# Patient Record
Sex: Male | Born: 1965 | Race: White | Hispanic: No | Marital: Single | Smoking: Current some day smoker
Health system: Southern US, Community
[De-identification: ages and names within clinical notes are randomized; demographics above are authoritative.]

## PROBLEM LIST (undated history)

## (undated) DIAGNOSIS — M949 Disorder of cartilage, unspecified: Secondary | ICD-10-CM

## (undated) DIAGNOSIS — E109 Type 1 diabetes mellitus without complications: Secondary | ICD-10-CM

## (undated) DIAGNOSIS — M899 Disorder of bone, unspecified: Secondary | ICD-10-CM

## (undated) DIAGNOSIS — Z951 Presence of aortocoronary bypass graft: Secondary | ICD-10-CM

## (undated) DIAGNOSIS — E039 Hypothyroidism, unspecified: Secondary | ICD-10-CM

## (undated) DIAGNOSIS — M84369A Stress fracture, unspecified tibia and fibula, initial encounter for fracture: Secondary | ICD-10-CM

## (undated) DIAGNOSIS — D62 Acute posthemorrhagic anemia: Secondary | ICD-10-CM

## (undated) DIAGNOSIS — I509 Heart failure, unspecified: Secondary | ICD-10-CM

## (undated) DIAGNOSIS — F329 Major depressive disorder, single episode, unspecified: Secondary | ICD-10-CM

## (undated) DIAGNOSIS — I214 Non-ST elevation (NSTEMI) myocardial infarction: Secondary | ICD-10-CM

## (undated) DIAGNOSIS — F411 Generalized anxiety disorder: Secondary | ICD-10-CM

## (undated) DIAGNOSIS — R569 Unspecified convulsions: Secondary | ICD-10-CM

## (undated) DIAGNOSIS — G894 Chronic pain syndrome: Secondary | ICD-10-CM

## (undated) DIAGNOSIS — R55 Syncope and collapse: Secondary | ICD-10-CM

## (undated) DIAGNOSIS — F32A Depression, unspecified: Secondary | ICD-10-CM

## (undated) DIAGNOSIS — R1311 Dysphagia, oral phase: Secondary | ICD-10-CM

## (undated) DIAGNOSIS — I10 Essential (primary) hypertension: Secondary | ICD-10-CM

## (undated) DIAGNOSIS — F1120 Opioid dependence, uncomplicated: Secondary | ICD-10-CM

## (undated) DIAGNOSIS — J9600 Acute respiratory failure, unspecified whether with hypoxia or hypercapnia: Secondary | ICD-10-CM

## (undated) DIAGNOSIS — I219 Acute myocardial infarction, unspecified: Secondary | ICD-10-CM

## (undated) DIAGNOSIS — E785 Hyperlipidemia, unspecified: Secondary | ICD-10-CM

## (undated) DIAGNOSIS — I2510 Atherosclerotic heart disease of native coronary artery without angina pectoris: Secondary | ICD-10-CM

## (undated) DIAGNOSIS — E162 Hypoglycemia, unspecified: Secondary | ICD-10-CM

## (undated) DIAGNOSIS — I255 Ischemic cardiomyopathy: Secondary | ICD-10-CM

## (undated) DIAGNOSIS — K429 Umbilical hernia without obstruction or gangrene: Secondary | ICD-10-CM

## (undated) HISTORY — DX: Non-ST elevation (NSTEMI) myocardial infarction: I21.4

## (undated) HISTORY — DX: Syncope and collapse: R55

## (undated) HISTORY — DX: Hyperlipidemia, unspecified: E78.5

## (undated) HISTORY — PX: APPENDECTOMY: SHX54

## (undated) HISTORY — DX: Ischemic cardiomyopathy: I25.5

## (undated) HISTORY — DX: Generalized anxiety disorder: F41.1

## (undated) HISTORY — DX: Acute respiratory failure, unspecified whether with hypoxia or hypercapnia: J96.00

## (undated) HISTORY — DX: Chronic pain syndrome: G89.4

## (undated) HISTORY — DX: Opioid dependence, uncomplicated: F11.20

## (undated) HISTORY — DX: Hypothyroidism, unspecified: E03.9

## (undated) HISTORY — DX: Dysphagia, oral phase: R13.11

## (undated) HISTORY — DX: Acute posthemorrhagic anemia: D62

## (undated) HISTORY — PX: CARDIAC CATHETERIZATION: SHX172

## (undated) HISTORY — DX: Depression, unspecified: F32.A

## (undated) HISTORY — DX: Stress fracture, unspecified tibia and fibula, initial encounter for fracture: M84.369A

## (undated) HISTORY — DX: Disorder of bone, unspecified: M89.9

## (undated) HISTORY — DX: Major depressive disorder, single episode, unspecified: F32.9

## (undated) HISTORY — DX: Heart failure, unspecified: I50.9

## (undated) HISTORY — DX: Hypoglycemia, unspecified: E16.2

## (undated) HISTORY — DX: Type 1 diabetes mellitus without complications: E10.9

## (undated) HISTORY — DX: Umbilical hernia without obstruction or gangrene: K42.9

## (undated) HISTORY — DX: Atherosclerotic heart disease of native coronary artery without angina pectoris: I25.10

## (undated) HISTORY — DX: Disorder of cartilage, unspecified: M94.9

## (undated) HISTORY — DX: Acute myocardial infarction, unspecified: I21.9

---
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
## 1998-08-31 DIAGNOSIS — I10 Essential (primary) hypertension: Secondary | ICD-10-CM | POA: Insufficient documentation

## 1999-07-28 ENCOUNTER — Emergency Department (HOSPITAL_COMMUNITY): Admission: EM | Admit: 1999-07-28 | Discharge: 1999-07-28 | Payer: Self-pay | Admitting: Emergency Medicine

## 2000-03-24 ENCOUNTER — Emergency Department (HOSPITAL_COMMUNITY): Admission: EM | Admit: 2000-03-24 | Discharge: 2000-03-24 | Payer: Self-pay | Admitting: Emergency Medicine

## 2000-09-10 ENCOUNTER — Emergency Department (HOSPITAL_COMMUNITY): Admission: EM | Admit: 2000-09-10 | Discharge: 2000-09-10 | Payer: Self-pay | Admitting: Emergency Medicine

## 2000-09-10 ENCOUNTER — Encounter: Payer: Self-pay | Admitting: Emergency Medicine

## 2003-09-01 DIAGNOSIS — F329 Major depressive disorder, single episode, unspecified: Secondary | ICD-10-CM | POA: Insufficient documentation

## 2008-10-13 ENCOUNTER — Ambulatory Visit: Payer: Self-pay | Admitting: Diagnostic Radiology

## 2008-10-13 ENCOUNTER — Emergency Department (HOSPITAL_BASED_OUTPATIENT_CLINIC_OR_DEPARTMENT_OTHER): Admission: EM | Admit: 2008-10-13 | Discharge: 2008-10-13 | Payer: Self-pay | Admitting: Emergency Medicine

## 2009-09-08 ENCOUNTER — Ambulatory Visit: Payer: Self-pay | Admitting: Diagnostic Radiology

## 2009-09-08 ENCOUNTER — Emergency Department (HOSPITAL_BASED_OUTPATIENT_CLINIC_OR_DEPARTMENT_OTHER): Admission: EM | Admit: 2009-09-08 | Discharge: 2009-09-08 | Payer: Self-pay | Admitting: Emergency Medicine

## 2009-09-13 ENCOUNTER — Emergency Department (HOSPITAL_COMMUNITY): Admission: EM | Admit: 2009-09-13 | Discharge: 2009-09-13 | Payer: Self-pay | Admitting: Emergency Medicine

## 2010-05-14 ENCOUNTER — Ambulatory Visit: Payer: Self-pay | Admitting: Nurse Practitioner

## 2010-05-14 DIAGNOSIS — E7800 Pure hypercholesterolemia, unspecified: Secondary | ICD-10-CM | POA: Insufficient documentation

## 2010-05-14 DIAGNOSIS — E039 Hypothyroidism, unspecified: Secondary | ICD-10-CM

## 2010-05-14 DIAGNOSIS — E109 Type 1 diabetes mellitus without complications: Secondary | ICD-10-CM

## 2010-05-14 LAB — CONVERTED CEMR LAB
Bilirubin Urine: NEGATIVE
Blood Glucose, Fingerstick: 113
Blood in Urine, dipstick: NEGATIVE
Cholesterol, target level: 200 mg/dL
Glucose, Urine, Semiquant: NEGATIVE
HDL goal, serum: 40 mg/dL
Hgb A1c MFr Bld: 6.2 %
Ketones, urine, test strip: NEGATIVE
LDL Goal: 100 mg/dL
Nitrite: NEGATIVE
Protein, U semiquant: NEGATIVE
Specific Gravity, Urine: 1.015
Urobilinogen, UA: 0.2
WBC Urine, dipstick: NEGATIVE
pH: 7

## 2010-05-15 LAB — CONVERTED CEMR LAB
ALT: 13 units/L (ref 0–53)
AST: 13 units/L (ref 0–37)
Barbiturate Quant, Ur: NEGATIVE
Basophils Absolute: 0 10*3/uL (ref 0.0–0.1)
Basophils Relative: 0 % (ref 0–1)
CO2: 26 meq/L (ref 19–32)
Calcium: 9.4 mg/dL (ref 8.4–10.5)
Chloride: 106 meq/L (ref 96–112)
Creatinine,U: 132.4 mg/dL
Hemoglobin: 14.5 g/dL (ref 13.0–17.0)
Lymphocytes Relative: 28 % (ref 12–46)
MCHC: 33.7 g/dL (ref 30.0–36.0)
Marijuana Metabolite: NEGATIVE
Neutro Abs: 4.4 10*3/uL (ref 1.7–7.7)
Neutrophils Relative %: 60 % (ref 43–77)
Opiate Screen, Urine: NEGATIVE
Platelets: 348 10*3/uL (ref 150–400)
Potassium: 4.4 meq/L (ref 3.5–5.3)
Propoxyphene: NEGATIVE
RDW: 14.4 % (ref 11.5–15.5)
Sodium: 142 meq/L (ref 135–145)
TSH: 0.326 microintl units/mL — ABNORMAL LOW (ref 0.350–4.500)
Total Protein: 7.1 g/dL (ref 6.0–8.3)

## 2010-05-16 ENCOUNTER — Encounter (INDEPENDENT_AMBULATORY_CARE_PROVIDER_SITE_OTHER): Payer: Self-pay | Admitting: Nurse Practitioner

## 2010-05-27 ENCOUNTER — Encounter (INDEPENDENT_AMBULATORY_CARE_PROVIDER_SITE_OTHER): Payer: Self-pay | Admitting: Nurse Practitioner

## 2010-06-02 ENCOUNTER — Telehealth (INDEPENDENT_AMBULATORY_CARE_PROVIDER_SITE_OTHER): Payer: Self-pay | Admitting: *Deleted

## 2010-06-04 ENCOUNTER — Encounter (INDEPENDENT_AMBULATORY_CARE_PROVIDER_SITE_OTHER): Payer: Self-pay | Admitting: Nurse Practitioner

## 2010-06-11 ENCOUNTER — Ambulatory Visit: Payer: Self-pay | Admitting: Nurse Practitioner

## 2010-06-11 DIAGNOSIS — N529 Male erectile dysfunction, unspecified: Secondary | ICD-10-CM

## 2010-06-12 ENCOUNTER — Encounter (INDEPENDENT_AMBULATORY_CARE_PROVIDER_SITE_OTHER): Payer: Self-pay | Admitting: Nurse Practitioner

## 2010-08-06 ENCOUNTER — Encounter (INDEPENDENT_AMBULATORY_CARE_PROVIDER_SITE_OTHER): Payer: Self-pay | Admitting: Nurse Practitioner

## 2010-08-31 HISTORY — PX: FRACTURE SURGERY: SHX138

## 2010-09-23 ENCOUNTER — Ambulatory Visit
Admission: RE | Admit: 2010-09-23 | Discharge: 2010-09-23 | Payer: Self-pay | Source: Home / Self Care | Attending: Nurse Practitioner | Admitting: Nurse Practitioner

## 2010-09-23 DIAGNOSIS — F17200 Nicotine dependence, unspecified, uncomplicated: Secondary | ICD-10-CM | POA: Insufficient documentation

## 2010-09-23 LAB — CONVERTED CEMR LAB
Nitrite: NEGATIVE
Urobilinogen, UA: 0.2

## 2010-09-28 LAB — CONVERTED CEMR LAB
Bilirubin Urine: NEGATIVE
Chlamydia, Swab/Urine, PCR: NEGATIVE
GC Probe Amp, Urine: NEGATIVE
HDL: 60 mg/dL (ref >39)
Ketones, urine, test strip: NEGATIVE
Nitrite: NEGATIVE
Protein, U semiquant: NEGATIVE
Total CHOL/HDL Ratio: 3.3
Triglycerides: 72 mg/dL (ref <150)
Urobilinogen, UA: 0.2
pH: 6.5

## 2010-09-29 ENCOUNTER — Encounter (INDEPENDENT_AMBULATORY_CARE_PROVIDER_SITE_OTHER): Payer: Self-pay | Admitting: Nurse Practitioner

## 2010-09-30 NOTE — Letter (Signed)
Summary: PT INFORMATION SHEET  PT INFORMATION SHEET   Imported By: Arta Bruce 05/15/2010 10:27:48  _____________________________________________________________________  External Attachment:    Type:   Image     Comment:   External Document

## 2010-09-30 NOTE — Letter (Signed)
Summary: Lipid Letter  [facility]  [address] [place], [place] [address]   Phone: [phone]  Fax: [phone]    06/12/2010  Reginald Gutierrez [address] [place], [place]  [address]  Dear Reginald Gutierrez:  We have carefully reviewed your last lipid profile from 06/11/2010 and the results are noted below with a summary of recommendations for lipid management.    Cholesterol:       200     Goal: less than 200   HDL "good" Cholesterol:   60     Goal: greater than 40   LDL "bad" Cholesterol:   126     Goal: less than 70   Triglycerides:       72     Goal: less than 150    Labs done during your recent office visit shows that your cholesterol is still not at goal. Please continue your medications.  Avoid fried fatty foods.    Current Medications: 1)    Lisinopril 40 Mg Tabs (Lisinopril) .... One tablet by mouth daily for blood pressure 2)    Levothroid 150 Mcg Tabs (Levothyroxine sodium) .... One tablet by mouth daily  **note change in dose** 3)    Lipitor 40 Mg Tabs (Atorvastatin calcium) .... One tablet by mouth nightly for cholesterol 4)    Humalog 100 Unit/ml Soln (Insulin lispro (human)) .... Use according to the sliding scale 5)    Lantus 100 Unit/ml Soln (Insulin glargine) .Marland Kitchen.. 15 units subcutaneously nightly 6)    Sertraline Hcl 100 Mg Tabs (Sertraline hcl) .... One tablet by mouth daily for mood *note change in dose** 7)    Blood Glucose Test  Strp (Glucose blood) .... Use to test blood sugar at least 4 times per day **brittle diabetic** 8)    Blood Glucose Meter  Kit (Blood glucose monitoring suppl) .... Use to check blood sugar at least 4 times per day 9)    Lancets  Misc (Lancets) .... Use to check blood sugar four times per day **brittle diabetic**  If you have any questions, please call. We appreciate being able to work with you.   Sincerely,    [facility] Lehman Prom FNP

## 2010-09-30 NOTE — Progress Notes (Signed)
Summary: NEEDS LETTER TO RETURN BACK TO WORK  Phone Note Call from Patient Call back at [phone]   Caller: Patient Reason for Call: Talk to Nurse Summary of Call: pt needs a letter stating that he si stable enough to go back to work and that he hasnt had any more low sugar episodes please fax letter to [phone] attn: to steve and romana Adante HAD NO BLOOD SUGAR EPISODES,Tylique DIABETES IS UNDER CONTROL AND HE IS ABLE TO WORK. Initial call taken by: Oscar La,  June 02, 2010 2:57 PM  Follow-up for Phone Call        MR Klippel CALLED AGAIN TO SEE IF HE CAN GET THE LETTER TO GO BACK TO WORK, BECAUSE HIS BOSS HAS CALLED HIM TWICE ASKING IF HIS DAIBETES IS UNDER CONTROL, BECAUE HE NEEDS TO WORK TO PAY HIS RENT OR HE WILL BE HOMELESS. HE NEEDS THE LETTER AT LEAST BY TOMORROW. Follow-up by: Leodis Rains,  June 03, 2010 5:00 PM  Additional Follow-up for Phone Call Additional follow up Details #1::        last hgba1c = 6.2 so his diabetes is under good control as far as hypoglycemic episodes - i don't know that as i am not with him and he has not presented me with a blood sugar log will write a letter indicating that sugar is controlled and he is ok to go back to work based on that. he can pick up letter today - in my office Additional Follow-up by: Lehman Prom FNP,  June 04, 2010 8:36 AM    Additional Follow-up for Phone Call Additional follow up Details #2::    FAXED LETTER  Follow-up by: Arta Bruce,  June 04, 2010 9:21 AM

## 2010-09-30 NOTE — Letter (Signed)
Summary: DEPARTMENT OF TRANSPORTATION/MAILED  DEPARTMENT OF TRANSPORTATION/MAILED   Imported By: Arta Bruce 05/16/2010 09:49:11  _____________________________________________________________________  External Attachment:    Type:   Image     Comment:   External Document

## 2010-09-30 NOTE — Letter (Signed)
Summary: Handout Printed  Printed Handout:  - Diet - Low-Cholesterol Guidelines 

## 2010-09-30 NOTE — Letter (Signed)
Summary: Handout Printed  Printed Handout:  - Depression-Brief 

## 2010-09-30 NOTE — Letter (Signed)
Summary: REQUESTING RECORDS FROM DR.[facility]  REQUESTING RECORDS FROM DR.KUMAR   Imported By: Arta Bruce 05/28/2010 14:31:00  _____________________________________________________________________  External Attachment:    Type:   Image     Comment:   External Document

## 2010-09-30 NOTE — Miscellaneous (Signed)
Summary: Records from Reather Littler  Clinical Lists Changes Historical file with records received

## 2010-09-30 NOTE — Letter (Signed)
Summary: Generic Letter  [facility]  [address] [place], [place] [address]   Phone: [phone]  Fax: [phone]    06/04/2010  Reginald Gutierrez [address] [place], [place]  [address]  To whom it may concern:  [name] established as a patient in this office on May 14, 2010.  His past medical history includes that of diabetes, which was diagnosed at age 45.  He is however stable at this time on his current dose of insulin.  Lab results done on that day show that his diabetes is now controlled.  Of course, he should continue to take his medications and monitor his diet which is also key to good diabetes control.  [name] may return to work at full capacity as of my last assessment.    Sincerely,    Lehman Prom FNP [facility]

## 2010-09-30 NOTE — Progress Notes (Signed)
Summary: Office Visit//DEPRESSION SCREENING  Office Visit//DEPRESSION SCREENING   Imported By: Arta Bruce 06/11/2010 12:29:23  _____________________________________________________________________  External Attachment:    Type:   Image     Comment:   External Document

## 2010-09-30 NOTE — Progress Notes (Signed)
Summary: Office Visit//DEPRESSION SCREENING  Office Visit//DEPRESSION SCREENING   Imported By: Arta Bruce 05/15/2010 10:41:40  _____________________________________________________________________  External Attachment:    Type:   Image     Comment:   External Document

## 2010-09-30 NOTE — Assessment & Plan Note (Signed)
Summary: New - Establish Care   Vital Signs:  Patient profile:   45 year old male Height:      64.5 inches Weight:      184.5 pounds BMI:     31.29 Temp:     97.6 degrees F oral Pulse rate:   73 / minute Pulse rhythm:   regular Resp:     16 per minute BP sitting:   142 / 76  (left arm) Cuff size:   regular  Vitals Entered By: Michelle Nasuti (May 14, 2010 8:40 AM) CC: establish care. dm and depression follow up, Lipid Management, Hypertension Management, Depression Is Patient Diabetic? Yes Pain Assessment Patient in pain? no      CBG Result 113 CBG Device ID [id number]  Does patient need assistance? Functional Status Self care Ambulation Normal  Vision Screening:      Vision Comments: 09/2007   CC:  establish care. dm and depression follow up, Lipid Management, Hypertension Management, and Depression.  History of Present Illness:  Pt into the office to establish care. Pt was previously seen by Dr. Lucianne Muss at a local office.  Last visit there 6 months.   Finances kept pt from returning to that office.  Diabetes - pt is checking his blood sugar three times per day  Depression History:      The patient presents with symptoms of depression which have been present for greater than two weeks.  The patient is having a depressed mood most of the day and has a diminished interest in his usual daily activities.  Positive alarm features for depression include significant weight gain and fatigue (loss of energy).  However, he denies recurrent thoughts of death or suicide.        Psychosocial stress factors include major life changes.  The patient denies that he feels like life is not worth living, denies that he wishes that he were dead, and denies that he has thought about ending his life.  His current symptoms often keep him from doing the things he needs to do.        Comments:  Pt was previously taking medications in the past but he is unable to recall the name of the  medications.  Pt has been going to [employer] for Counseling and is going once weekly. .  Depression Treatment History:  Prior Medication Used:   Start Date: Assessment of Effect:   Comments:  Zoloft (sertraline)     05/14/2010   some improvement     --  Diabetes Management History:      The patient is a 45 years old male who comes in for evaluation of DM Type 1.  He has not been enrolled in the "Diabetic Education Program".  He states understanding of dietary principles but he is not following the appropriate diet.  No sensory loss is reported.  Self foot exams are not being performed.  He is checking home blood sugars.        Reported hypoglycemic symptoms include weakness.  No hyperglycemic symptoms are reported.    Hypertension History:      He denies headache, chest pain, and palpitations.  He notes no problems with any antihypertensive medication side effects.        Positive major cardiovascular risk factors include diabetes, hyperlipidemia, hypertension, and current tobacco user.  Negative major cardiovascular risk factors include male age less than 69 years old.    Lipid Management History:      Positive NCEP/ATP  III risk factors include diabetes, current tobacco user, and hypertension.  Negative NCEP/ATP III risk factors include male age less than 45 years old.        Comments include: Pt has not been taking lipitor in the past 2 months due to finances .  Comments: he is not fasting today so is unable to get labs.  Diabetic Foot Exam Pulse Check          Right Foot          Left Foot Dorsalis Pedis:        normal            normal    10-g (5.07) Semmes-Weinstein Monofilament Test Performed by: Michelle Nasuti          Right Foot          Left Foot Visual Inspection               Test Control      normal         normal Site 1         normal         normal Site 2         normal         normal Site 3         normal         normal Site 4         normal          normal Site 5         normal         normal Site 6         normal         normal Site 7         normal         normal Site 8         normal         normal Site 9         normal         normal Site 10         normal         normal  Impression      normal         normal  Legend:  Site 1 = Plantar aspect of first toe (center of pad) Site 2 = Plantar aspect of third toe (center of pad) Site 3 = Plantar aspect of fifth toe (center of pad) Site 4 = Plantar aspect of first metatarsal head Site 5 = Plantar aspect of third metatarsal head Site 6 = Plantar aspect of fifth metatarsal head Site 7 = Plantar aspect of medial midfoot Site 8 = Plantar aspect of lateral midfoot Site 9 = Plantar aspect of heel Site 10 = dorsal aspect of foot between the base of the first and second toes   Result is Abnormal if patient was unable to perceive the monofilament at site indicated.       Habits & Providers  Alcohol-Tobacco-Diet     Alcohol drinks/day: 0     Tobacco Status: current     Tobacco Counseling: to quit use of tobacco products     Cigarette Packs/Day: 0.5     Year Started: age 61  Exercise-Depression-Behavior     Have you felt down or hopeless? yes     Have you felt little pleasure in things? yes     Depression Counseling: further diagnostic testing  and/or other treatment is indicated     Drug Use: past     Drug Use Counseling: quit - 2005  Comments: PHQ-9 score = 23  Current Medications (verified): 1)  Lisinopril 10 Mg Tabs (Lisinopril) .... One Daily 2)  Levothroid 175 Mcg Tabs (Levothyroxine Sodium) .... One Daily 3)  Lipitor 40 Mg Tabs (Atorvastatin Calcium) .... One Q Hs 4)  Humalog Kwikpen 100 Unit/ml Soln (Insulin Lispro (Human)) .... Sliding Scale 5)  Lantus 100 Unit/ml Soln (Insulin Glargine) .Marland KitchenMarland Kitchen. 17 Units Q Hs  Allergies (verified): No Known Drug Allergies  Past History:  Past Surgical History: Appendectomy 1978  Social History: Smoking Status:   current Packs/Day:  0.5 Drug Use:  past  Review of Systems General:  Denies fever. CV:  Denies chest pain or discomfort. Resp:  Denies cough. GI:  Denies abdominal pain. Neuro:  Denies headaches. Psych:  Complains of depression; denies mental problems.  Physical Exam  General:  alert.   Head:  normocephalic.   Eyes:  glasses Lungs:  normal breath sounds.   Heart:  normal rate and regular rhythm.   Abdomen:  normal bowel sounds.   Msk:  normal ROM.   Neurologic:  alert & oriented X3.   Skin:  color normal.   Psych:  Oriented X3.    Diabetes Management Exam:    Foot Exam (with socks and/or shoes not present):       Sensory-Monofilament:          Left foot: normal          Right foot: normal       Inspection:          Left foot: abnormal             Comments: callous on heels          Right foot: abnormal             Comments: callous on heels       Nails:          Left foot: normal          Right foot: normal    Eye Exam:       Eye Exam done elsewhere          Date: 08/31/2006          Results: normal          Done by: Optho   Impression & Recommendations:  Problem # 1:  DIABETES MELLITUS, TYPE I (ICD-250.01) Hgba1c controlled. continue current dose of insulin will need to review records to see if pt every had pneumovax advised pt that eye exam is due His updated medication list for this problem includes:    Lisinopril 20 Mg Tabs (Lisinopril) ..... One tablet by mouth daily for blood pressure    Humalog 100 Unit/ml Soln (Insulin lispro (human)) ..... Use according to the sliding scale    Lantus 100 Unit/ml Soln (Insulin glargine) .Marland KitchenMarland KitchenMarland KitchenMarland Kitchen 17 units subcutaneously nightly  Orders: Hemoglobin A1C (83036) UA Dipstick w/o Micro (manual) (04540) T-Urine Microalbumin w/creat. ratio [phone]) T-Comprehensive Metabolic Panel [phone]) T-CBC w/Diff (96295-28413)  Problem # 2:  HYPERTENSION, BENIGN ESSENTIAL (ICD-401.1) elevated today will increase  meds DASH diet His updated medication list for this problem includes:    Lisinopril 20 Mg Tabs (Lisinopril) ..... One tablet by mouth daily for blood pressure  Problem # 3:  HYPOTHYROIDISM (ICD-244.9) Will check labs today His updated medication list for this problem includes:    Levothroid 175 Mcg  Tabs (Levothyroxine sodium) ..... One tablet by mouth daily  Orders: T-TSH ([id number])  Problem # 4:  HYPERCHOLESTEROLEMIA (ICD-272.0) pt is not fasting. will check labs on next visit His updated medication list for this problem includes:    Lipitor 40 Mg Tabs (Atorvastatin calcium) ..... One tablet by mouth nightly for cholesterol  Problem # 5:  DEPRESSION (ICD-311) pt to continue counseling will start on SSRI His updated medication list for this problem includes:    Sertraline Hcl 50 Mg Tabs (Sertraline hcl) ..... One tablet by mouth nigtly for mood  Complete Medication List: 1)  Lisinopril 20 Mg Tabs (Lisinopril) .... One tablet by mouth daily for blood pressure 2)  Levothroid 175 Mcg Tabs (Levothyroxine sodium) .... One tablet by mouth daily 3)  Lipitor 40 Mg Tabs (Atorvastatin calcium) .... One tablet by mouth nightly for cholesterol 4)  Humalog 100 Unit/ml Soln (Insulin lispro (human)) .... Use according to the sliding scale 5)  Lantus 100 Unit/ml Soln (Insulin glargine) .Marland KitchenMarland KitchenMarland Kitchen 17 units subcutaneously nightly 6)  Sertraline Hcl 50 Mg Tabs (Sertraline hcl) .... One tablet by mouth nigtly for mood  Other Orders: Capillary Blood Glucose/CBG (01601)  Diabetes Management Assessment/Plan:      The following lipid goals have been established for the patient: Total cholesterol goal of 200; LDL cholesterol goal of 100; HDL cholesterol goal of 40; Triglyceride goal of 150.  His blood pressure goal is < 130/80.    Hypertension Assessment/Plan:      The patient's hypertensive risk group is category C: Target organ damage and/or diabetes.  Today's blood pressure is 142/76.  His blood  pressure goal is < 130/80.  Lipid Assessment/Plan:      Based on NCEP/ATP III, the patient's risk factor category is "history of diabetes".  The patient's lipid goals are as follows: Total cholesterol goal is 200; LDL cholesterol goal is 100; HDL cholesterol goal is 40; Triglyceride goal is 150.    Patient Instructions: 1)  Sign a release of records to get your records from Dr. Lucianne Muss 2)  Diabetes - your Hgba1c = 6.2 today which is very good.  Keep on your current dose of insulin 3)  High blood pressure - lisinopril has been increased to 20mg  by mouth daily 4)  Depression - Continue to go to therapy sessions weekly 5)  will start sertrazine 50mg  by mouth NIGHTLY.  This will take 4-6 weeks to build up in your system but you should start to see some improvement.  Remember the goal is for gradual improvement. 6)  you will be informed of lab results from today 7)  Follow up for a complete physical exam in 4 weeks. 8)  Come fasting for labs - lipids. 9)  Will need EKG, u/a, foot check, cbg, PHQ-9 score. 10)  Will discuss flu vaccine if available Prescriptions: SERTRALINE HCL 50 MG TABS (SERTRALINE HCL) One tablet by mouth nigtly for mood  #30 x 3   Entered and Authorized by:   Lehman Prom FNP   Signed by:   Lehman Prom FNP on 05/14/2010   Method used:   Print then Give to Patient   RxID:   [id number] LISINOPRIL 20 MG TABS (LISINOPRIL) One tablet by mouth daily for blood pressure  #30 x 3   Entered and Authorized by:   Lehman Prom FNP   Signed by:   Lehman Prom FNP on 05/14/2010   Method used:   Print then Give to Patient   RxID:   [id number] LIPITOR 40  MG TABS (ATORVASTATIN CALCIUM) One tablet by mouth nightly for cholesterol  #30 x 3   Entered and Authorized by:   Lehman Prom FNP   Signed by:   Lehman Prom FNP on 05/14/2010   Method used:   Print then Give to Patient   RxID:   [id number] HUMALOG 100 UNIT/ML SOLN (INSULIN LISPRO (HUMAN)) Use  according to the sliding scale  #1 x 3   Entered and Authorized by:   Lehman Prom FNP   Signed by:   Lehman Prom FNP on 05/14/2010   Method used:   Print then Give to Patient   RxID:   [id number] LANTUS 100 UNIT/ML SOLN (INSULIN GLARGINE) 17 units subcutaneously nightly  #1 x 3   Entered and Authorized by:   Lehman Prom FNP   Signed by:   Lehman Prom FNP on 05/14/2010   Method used:   Print then Give to Patient   RxID:   [id number] LEVOTHROID 175 MCG TABS (LEVOTHYROXINE SODIUM) One tablet by mouth daily  #30 x 3   Entered and Authorized by:   Lehman Prom FNP   Signed by:   Lehman Prom FNP on 05/14/2010   Method used:   Print then Give to Patient   RxID:   [id number]           Diabetic Foot Exam Pulse Check          Right Foot          Left Foot Dorsalis Pedis:        normal            normal    10-g (5.07) Semmes-Weinstein Monofilament Test Performed by: Michelle Nasuti          Right Foot          Left Foot Visual Inspection               Test Control      normal         normal Site 1         normal         normal Site 2         normal         normal Site 3         normal         normal Site 4         normal         normal Site 5         normal         normal Site 6         normal         normal Site 7         normal         abnormal Site 8         abnormal         abnormal Site 9         abnormal         normal Site 10         normal         normal   Laboratory Results   Urine Tests  Date/Time Received: May 14, 2010 8:48 AM   Routine Urinalysis   Color: lt. yellow Appearance: Clear Glucose: negative   (Normal Range: Negative) Bilirubin: negative   (Normal Range: Negative) Ketone: negative   (Normal Range: Negative) Spec. Gravity: 1.015   (Normal Range: 1.003-1.035)  Blood: negative   (Normal Range: Negative) pH: 7.0   (Normal Range: 5.0-8.0) Protein: negative   (Normal Range: Negative) Urobilinogen: 0.2    (Normal Range: 0-1) Nitrite: negative   (Normal Range: Negative) Leukocyte Esterace: negative   (Normal Range: Negative)     Blood Tests     HGBA1C: 6.2%   (Normal Range: Non-Diabetic - 3-6%   Control Diabetic - 6-8%) CBG Random:: 113      Appended Document: New - Establish Care    Clinical Lists Changes  Orders: Added new Test order of T-Drug [facility]) [phone]) - Signed

## 2010-09-30 NOTE — Letter (Signed)
Summary: RECEIVED INFORMATION FROM Reather Littler.M.D  RECEIVED INFORMATION FROM Reather Littler.M.D   Imported By: Arta Bruce 08/08/2010 11:33:16  _____________________________________________________________________  External Attachment:    Type:   Image     Comment:   External Document

## 2010-09-30 NOTE — Assessment & Plan Note (Signed)
Summary: Complete Physical Exam   Vital Signs:  Patient profile:   45 year old male Weight:      182.2 pounds BMI:     30.90 Temp:     97.5 degrees F oral Pulse rate:   88 / minute Pulse rhythm:   regular Resp:     20 per minute BP sitting:   130 / 70  (left arm) Cuff size:   regular  Vitals Entered By: Levon Hedger (June 11, 2010 9:53 AM)  Nutrition Counseling: Patient's BMI is greater than 25 and therefore counseled on weight management options. CC: CPE, Hypertension Management, Depression Is Patient Diabetic? Yes CBG Result 39 CBG Device ID B  Does patient need assistance? Functional Status Self care Ambulation Normal   CC:  CPE, Hypertension Management, and Depression.  History of Present Illness:  Pt into the office for a complete physical exam.  Optho - wears glasses. last eye exam was over 2 years ago.    Dental - no recent dental exam. Pt is aware that he needs to get an dental exam  Social - Pt has started back to work.  Pt did not bring his medications into the office today.  Advised pt to bring meds into the office  Depression History:      The patient is having a depressed mood most of the day and has a diminished interest in his usual daily activities.        The patient denies that he feels like life is not worth living, denies that he wishes that he were dead, and denies that he has thought about ending his life.  Due to his current symptoms, it often takes extra effort to do the things he needs to do.         Depression Treatment History:  Prior Medication Used:   Start Date: Assessment of Effect:   Comments:  Zoloft (sertraline)     05/14/2010   some improvement     --  Diabetes Management History:      The patient is a 45 years old male who comes in for evaluation of DM Type 1.  He has not been enrolled in the "Diabetic Education Program".  He states lack of understanding of dietary principles and is not following his diet appropriately.   No sensory loss is reported.  Self foot exams are not being performed.  He is checking home blood sugars.        Reported hypoglycemic symptoms include confusion and weakness.  No hyperglycemic symptoms are reported.  Other comments include: Pt has frequent low hypoglycemic episodes.    Hypertension History:      He denies headache, chest pain, and palpitations.  He notes no problems with any antihypertensive medication side effects.        Positive major cardiovascular risk factors include diabetes, hyperlipidemia, hypertension, and current tobacco user.  Negative major cardiovascular risk factors include male age less than 22 years old.        Further assessment for target organ damage reveals no history of ASHD, cardiac end-organ damage (CHF/LVH), stroke/TIA, peripheral vascular disease, renal insufficiency, or hypertensive retinopathy.      Habits & Providers  Alcohol-Tobacco-Diet     Alcohol drinks/day: 0     Tobacco Status: current     Tobacco Counseling: to quit use of tobacco products     Cigarette Packs/Day: 0.5     Year Started: age 57  Exercise-Depression-Behavior     Exercise Counseling: to  improve exercise regimen     Have you felt down or hopeless? yes     Have you felt little pleasure in things? yes     Depression Counseling: further diagnostic testing and/or other treatment is indicated     Drug Use: past  Comments: Pt has started to see the psychologist PHQ-9 score = 22  Current Medications (verified): 1)  Lisinopril 20 Mg Tabs (Lisinopril) .... One Tablet By Mouth Daily For Blood Pressure 2)  Levothroid 150 Mcg Tabs (Levothyroxine Sodium) .... One Tablet By Mouth Daily  **note Change in Dose** 3)  Lipitor 40 Mg Tabs (Atorvastatin Calcium) .... One Tablet By Mouth Nightly For Cholesterol 4)  Humalog 100 Unit/ml Soln (Insulin Lispro (Human)) .... Use According To The Sliding Scale 5)  Lantus 100 Unit/ml Soln (Insulin Glargine) .Marland Kitchen.. 15 Units Subcutaneously  Nightly 6)  Sertraline Hcl 50 Mg Tabs (Sertraline Hcl) .... One Tablet By Mouth Nigtly For Mood 7)  Blood Glucose Test  Strp (Glucose Blood) .... Use To Test Blood Sugar At Least 4 Times Per Day **brittle Diabetic** 8)  Blood Glucose Meter  Kit (Blood Glucose Monitoring Suppl) .... Use To Check Blood Sugar At Least 4 Times Per Day 9)  Lancets  Misc (Lancets) .... Use To Check Blood Sugar Four Times Per Day **brittle Diabetic**  Allergies (verified): No Known Drug Allergies  Review of Systems General:  Denies fever. Eyes:  Denies blurring. ENT:  Denies earache. CV:  Denies chest pain or discomfort. Resp:  Denies cough. GI:  Denies abdominal pain, nausea, and vomiting. GU:  Denies dysuria. MS:  Denies joint pain. Derm:  Denies rash. Neuro:  Denies headaches. Psych:  Complains of depression.  Physical Exam  General:  alert.   Head:  normocephalic.   Eyes:  glasses Ears:  R ear normal and L ear normal.   Nose:  no nasal discharge.   Mouth:  fair dentition.   Neck:  supple.   Chest Wall:  no masses.   Breasts:  no gynecomastia.   Lungs:  normal breath sounds.   Heart:  normal rate and regular rhythm.   Abdomen:  soft, non-tender, and normal bowel sounds.   Rectal:  no external abnormalities.   Genitalia:  circumcised.  no testicular masses Prostate:  no gland enlargement.   Msk:  normal ROM.   Pulses:  R radial normal and L radial normal.   Extremities:  no edema Neurologic:  alert & oriented X3.   Skin:  color normal.   Psych:  Oriented X3.    Diabetes Management Exam:    Foot Exam (with socks and/or shoes not present):       Sensory-Monofilament:          Left foot: normal          Right foot: normal       Nails:          Left foot: normal          Right foot: normal   Impression & Recommendations:  Problem # 1:  HEALTH MAINTENANCE EXAM (ICD-V70.0) PHQ-9 score = 22 Advised of rec optho and dental exam labs cone prostate and rectal done Orders: T-Syphilis  Test (RPR) (14782-95621) Rapid HIV  (30865) T-GC Probe, urine (78469-62952) UA Dipstick w/o Micro (manual) (84132)  Problem # 2:  DIABETES MELLITUS, TYPE I (ICD-250.01) still with frqunt lows advised pt to check BS four times per day eat protein snacks between meals His updated medication list for this problem  includes:    Lisinopril 40 Mg Tabs (Lisinopril) ..... One tablet by mouth daily for blood pressure    Humalog 100 Unit/ml Soln (Insulin lispro (human)) ..... Use according to the sliding scale    Lantus 100 Unit/ml Soln (Insulin glargine) .Marland KitchenMarland KitchenMarland KitchenMarland Kitchen 15 units subcutaneously nightly  Problem # 3:  HYPERTENSION, BENIGN ESSENTIAL (ICD-401.1)  His updated medication list for this problem includes:    Lisinopril 40 Mg Tabs (Lisinopril) ..... One tablet by mouth daily for blood pressure  Orders: EKG w/ Interpretation (93000)  Problem # 4:  HYPOTHYROIDISM (ICD-244.9)  His updated medication list for this problem includes:    Levothroid 150 Mcg Tabs (Levothyroxine sodium) ..... One tablet by mouth daily  **note change in dose**  Problem # 5:  HYPERCHOLESTEROLEMIA (ICD-272.0)  His updated medication list for this problem includes:    Lipitor 40 Mg Tabs (Atorvastatin calcium) ..... One tablet by mouth nightly for cholesterol  Orders: T-Lipid Profile (86578-46962)  Problem # 6:  DEPRESSION (ICD-311)  His updated medication list for this problem includes:    Sertraline Hcl 100 Mg Tabs (Sertraline hcl) ..... One tablet by mouth daily for mood *note change in dose**  Complete Medication List: 1)  Lisinopril 40 Mg Tabs (Lisinopril) .... One tablet by mouth daily for blood pressure 2)  Levothroid 150 Mcg Tabs (Levothyroxine sodium) .... One tablet by mouth daily  **note change in dose** 3)  Lipitor 40 Mg Tabs (Atorvastatin calcium) .... One tablet by mouth nightly for cholesterol 4)  Humalog 100 Unit/ml Soln (Insulin lispro (human)) .... Use according to the sliding scale 5)  Lantus 100  Unit/ml Soln (Insulin glargine) .Marland Kitchen.. 15 units subcutaneously nightly 6)  Sertraline Hcl 100 Mg Tabs (Sertraline hcl) .... One tablet by mouth daily for mood *note change in dose** 7)  Blood Glucose Test Strp (Glucose blood) .... Use to test blood sugar at least 4 times per day **brittle diabetic** 8)  Blood Glucose Meter Kit (Blood glucose monitoring suppl) .... Use to check blood sugar at least 4 times per day 9)  Lancets Misc (Lancets) .... Use to check blood sugar four times per day **brittle diabetic**  Other Orders: Capillary Blood Glucose/CBG (95284) Flu Vaccine 41yrs + (13244) Admin 1st Vaccine (01027) Admin 1st Vaccine [facility]) [phone])  Diabetes Management Assessment/Plan:      The following lipid goals have been established for the patient: Total cholesterol goal of 200; LDL cholesterol goal of 100; HDL cholesterol goal of 40; Triglyceride goal of 150.  His blood pressure goal is < 130/80.    Hypertension Assessment/Plan:      The patient's hypertensive risk group is category C: Target organ damage and/or diabetes.  Today's blood pressure is 130/70.  His blood pressure goal is < 130/80.  Patient Instructions: 1)  Niurse visit in 3 weeks for TSH 2)  Remember about the free dental clinic in November. 3)  Mood - increase medication to sertralizine 100mg  by mouth daily. 4)  Flu vaccine was given today 5)  Diabetes - you should be checking your blood sugar four times per day.  A prescription for meter and supplies has been sent to [facility] pharmacy.  Remember to eat snacks between your meals. 6)  Follow up with n.martin,fnp in 2 months for diabetes. 7)  Will need tdap, hgba1c, foot check, cbg Prescriptions: SERTRALINE HCL 100 MG TABS (SERTRALINE HCL) One tablet by mouth daily for mood *Note change in dose**  #30 x 3   Entered and Authorized by:  Lehman Prom FNP   Signed by:   Lehman Prom FNP on 06/11/2010   Method used:   Print then Give to Patient   RxID:    [id number] LISINOPRIL 40 MG TABS (LISINOPRIL) One tablet by mouth daily for blood pressure  #30 x 0   Entered and Authorized by:   Lehman Prom FNP   Signed by:   Lehman Prom FNP on 06/11/2010   Method used:   Print then Give to Patient   RxID:   [id number] LANCETS  MISC (LANCETS) Use to check blood sugar four times per day **brittle diabetic**  #100 x 5   Entered and Authorized by:   Lehman Prom FNP   Signed by:   Lehman Prom FNP on 06/11/2010   Method used:   Faxed to ...       [facility] - [facility] (retail)       [address] [place], [place]  [address]       Ph: [phone]       Fax: [phone]   RxID:   [id number] BLOOD GLUCOSE METER  KIT (BLOOD GLUCOSE MONITORING SUPPL) use to check blood sugar at least 4 times per day  #1 meter x 0   Entered and Authorized by:   Lehman Prom FNP   Signed by:   Lehman Prom FNP on 06/11/2010   Method used:   Faxed to ...       [facility] - Pharmac (retail)       [address] [place], [place]  [address]       Ph: [phone]       Fax: [phone]   RxID:   [id number] BLOOD GLUCOSE TEST  STRP (GLUCOSE BLOOD) Use to test blood sugar at least 4 times per day **Brittle diabetic**  #100 x 5   Entered and Authorized by:   Lehman Prom FNP   Signed by:   Lehman Prom FNP on 06/11/2010   Method used:   Faxed to ...       [facility] - Pharmac (retail)       [address] [place], [place]  [address]       Ph: [phone]       Fax: [phone]   RxID:   [id number]   Diabetic Foot Exam Foot Inspection Is there a history of a foot ulcer?              No Is there a foot ulcer now?              No Is there swelling or an abnormal foot shape?          No Are the toenails long?                No Are the toenails thick?                No Are the toenails ingrown?               No Is there heavy callous build-up?              Yes Is there pain in the calf muscle (Intermittent claudication) when walking?    NoIs there a claw toe deformity?              No Is there elevated skin temperature?  No Is there limited ankle dorsiflexion?            No Is there foot or ankle muscle weakness?            No  Diabetic Foot Care Education Pulse Check          Right Foot          Left Foot Dorsalis Pedis:        normal            normal Comments: callous on bilateral heels   10-g (5.07) Semmes-Weinstein Monofilament Test           Right Foot          Left Foot Visual Inspection               Test Control      normal         normal Site 1         normal         normal Site 2         normal         normal Site 3         normal         normal Site 4         normal         normal Site 5         normal         normal Site 6         normal         normal Site 7         normal         normal Site 8         normal         normal Site 9         abnormal         abnormal Site 10         normal         normal  Impression      normal         normal  Legend:  Site 1 = Plantar aspect of first toe (center of pad) Site 2 = Plantar aspect of third toe (center of pad) Site 3 = Plantar aspect of fifth toe (center of pad) Site 4 = Plantar aspect of first metatarsal head Site 5 = Plantar aspect of third metatarsal head Site 6 = Plantar aspect of fifth metatarsal head Site 7 = Plantar aspect of medial midfoot Site 8 = Plantar aspect of lateral midfoot Site 9 = Plantar aspect of heel Site 10 = dorsal aspect of foot between the base of the first and second toes   Result is Abnormal if patient was unable to perceive the monofilament at site indicated.   Prevention & Chronic Care Immunizations   Influenza vaccine: Fluvax 3+  (06/11/2010)    Tetanus booster: Not documented    Pneumococcal vaccine: Not documented  Other Screening   Smoking status: current   (06/11/2010)   Smoking cessation counseling: yes  (06/11/2010)  Diabetes Mellitus   HgbA1C: 6.2  (05/14/2010)    Eye exam: normal  (08/31/2006)    Foot exam: yes  (06/11/2010)   High risk foot: Not documented   Foot care education: Not documented    Urine microalbumin/creatinine ratio: Not documented   Urine microalbumin/cr due: 05/14/2010  Lipids   Total Cholesterol: Not documented  LDL: Not documented   LDL Direct: Not documented   HDL: Not documented   Triglycerides: Not documented    SGOT (AST): 13  (05/14/2010)   SGPT (ALT): 13  (05/14/2010)   Alkaline phosphatase: 89  (05/14/2010)   Total bilirubin: 0.5  (05/14/2010)  Hypertension   Last Blood Pressure: 130 / 70  (06/11/2010)   Serum creatinine: 0.77  (05/14/2010)   Serum potassium 4.4  (05/14/2010)  Self-Management Support :    Diabetes self-management support: Not documented    Hypertension self-management support: Not documented    Lipid self-management support: Not documented    Nursing Instructions: Give Flu vaccine today     EKG  Procedure date:  06/11/2010  Findings:      normal:  76   Laboratory Results   Urine Tests  Date/Time Received: June 11, 2010 11:01 AM   Routine Urinalysis   Color: lt. yellow Glucose: negative   (Normal Range: Negative) Bilirubin: negative   (Normal Range: Negative) Ketone: negative   (Normal Range: Negative) Spec. Gravity: 1.010   (Normal Range: 1.003-1.035) Blood: negative   (Normal Range: Negative) pH: 6.5   (Normal Range: 5.0-8.0) Protein: negative   (Normal Range: Negative) Urobilinogen: 0.2   (Normal Range: 0-1) Nitrite: negative   (Normal Range: Negative) Leukocyte Esterace: negative   (Normal Range: Negative)     Blood Tests     CBG Random:: 39mg /dL  Comments: repeat CBG 308 Date/Time Received: June 11, 2010 11:50 AM   Other Tests  Rapid HIV: negative     Influenza Vaccine    Vaccine Type: Fluvax 3+    Site: right  deltoid    Mfr: GlaxoSmithKline    Dose: 0.5 ml    Route: IM    Given by: Levon Hedger    Exp. Date: 01/2011    Lot #: [id number]    VIS given: 03/25/10 version given June 11, 2010.  Flu Vaccine Consent Questions    Do you have a history of severe allergic reactions to this vaccine? no    Any prior history of allergic reactions to egg and/or gelatin? no    Do you have a sensitivity to the preservative Thimersol? no    Do you have a past history of Guillan-Barre Syndrome? no    Do you currently have an acute febrile illness? no    Have you ever had a severe reaction to latex? no    Vaccine information given and explained to patient? yes   ndc  [phone]   Laboratory Results   Urine Tests    Routine Urinalysis   Color: lt. yellow Glucose: negative   (Normal Range: Negative) Bilirubin: negative   (Normal Range: Negative) Ketone: negative   (Normal Range: Negative) Spec. Gravity: 1.010   (Normal Range: 1.003-1.035) Blood: negative   (Normal Range: Negative) pH: 6.5   (Normal Range: 5.0-8.0) Protein: negative   (Normal Range: Negative) Urobilinogen: 0.2   (Normal Range: 0-1) Nitrite: negative   (Normal Range: Negative) Leukocyte Esterace: negative   (Normal Range: Negative)     Blood Tests     CBG Random:: 39  Comments: repeat CBG 148   Other Tests  Rapid HIV: negative

## 2010-09-30 NOTE — Letter (Signed)
Summary: Handout Printed  Printed Handout:  - ED (Erectile Dysfunction) 

## 2010-10-02 NOTE — Assessment & Plan Note (Signed)
Summary: Diabetes   Vital Signs:  Patient profile:   45 year old male Weight:      186.2 pounds BMI:     31.58 Pulse rate:   88 / minute Pulse rhythm:   regular Resp:     20 per minute BP sitting:   146 / 82  (left arm) Cuff size:   regular  Vitals Entered By: Levon Hedger (September 23, 2010 11:56 AM)  Nutrition Counseling: Patient's BMI is greater than 25 and therefore counseled on weight management options. CC: follow-up visit DM...depression, Hypertension Management, Lipid Management, Depression Is Patient Diabetic? Yes Pain Assessment Patient in pain? yes      CBG Result 103 CBG Device ID B  Does patient need assistance? Functional Status Self care Ambulation Normal   CC:  follow-up visit DM...depression, Hypertension Management, Lipid Management, and Depression.  History of Present Illness:  Pt into the office for f/u on diabetes and high blood pressure.  Diabetes - Still has brittle diabetes and his blood sugars have been fluctating up and down. Blood sugar was over 300 at home this morning.  He took one unit of insulin at home. Blood sugar down to 103 when he was checked today in the office.  Pt reports that when blood sugar gets below 200 has some symptoms.  Blood sugar drops suddenly over time.  Social - Pt tried to go back to work following his last visit here, back to a job he loved but he had an episode of a low blood sugar and passed out.  He actually had 2 episodes on the job - after the 1st he was cleared to return to work but then he has another episode.  He was terminated from the position. "I have lost every job since 2006 due to low blood sugar"  ADHD - previously taking Blase Mess many years ago but admits this was doing a time when he was experimenting with drugs.  He is now clean and sober.  Mother present with pt during the exam and reports that there is some dietary indescretions.  Both agree that pt eats BIG meals.  He states that he tries to  eat several small meals during the day.  Depression History:      The patient is having a depressed mood most of the day and has a diminished interest in his usual daily activities.  Positive alarm features for depression include fatigue (loss of energy), feelings of worthlessness (guilt), and impaired concentration (indecisiveness).  However, he denies recurrent thoughts of death or suicide.        Psychosocial stress factors include the recent death of a loved one and major life changes.  The patient denies that he feels like life is not worth living, denies that he wishes that he were dead, and denies that he has thought about ending his life.        Comments:  Pt has been to [employer] [facility] [employer]. [phone] .  Depression Treatment History:  Prior Medication Used:   Start Date: Assessment of Effect:   Comments:  Zoloft (sertraline)     05/14/2010   some improvement     no improvement in symptoms  Diabetes Management History:      The patient is a 45 years old male who comes in for evaluation of DM Type 1.  He has not been enrolled in the "Diabetic Education Program".  He states lack of understanding of dietary principles and is not following  his diet appropriately.  No sensory loss is reported.  Self foot exams are not being performed.  He is checking home blood sugars.  He says that he is not exercising regularly.        Hypoglycemic symptoms are not occurring.  No hyperglycemic symptoms are reported.  Other comments include: September was a good month - al the other months were fluctuations in blood sugar.    Hypertension History:      He denies headache, chest pain, and palpitations.  He notes no problems with any antihypertensive medication side effects.        Positive major cardiovascular risk factors include diabetes, hyperlipidemia, hypertension, and current tobacco user.  Negative major cardiovascular risk factors include male age less than 69 years  old.        Further assessment for target organ damage reveals no history of ASHD, cardiac end-organ damage (CHF/LVH), stroke/TIA, peripheral vascular disease, renal insufficiency, or hypertensive retinopathy.    Lipid Management History:      Positive NCEP/ATP III risk factors include diabetes, current tobacco user, and hypertension.  Negative NCEP/ATP III risk factors include male age less than 1 years old, HDL cholesterol greater than 60, no ASHD (atherosclerotic heart disease), no prior stroke/TIA, and no peripheral vascular disease.        The patient states that he does not know about the "Therapeutic Lifestyle Change" diet.  The patient does not know about adjunctive measures for cholesterol lowering.  He expresses no side effects from his lipid-lowering medication.  The patient denies any symptoms to suggest myopathy or liver disease.       Habits & Providers  Alcohol-Tobacco-Diet     Alcohol drinks/day: 0     Tobacco Status: current     Tobacco Counseling: to quit use of tobacco products     Cigarette Packs/Day: 0.5     Year Started: age 71  Exercise-Depression-Behavior     Does Patient Exercise: no     Exercise Counseling: to improve exercise regimen     Depression Counseling: further diagnostic testing and/or other treatment is indicated     Drug Use: past  Allergies (verified): No Known Drug Allergies  Social History: Does Patient Exercise:  no  Review of Systems CV:  Denies chest pain or discomfort. Resp:  Denies cough. GI:  Denies abdominal pain, nausea, and vomiting. Psych:  Complains of depression and easily angered; Pt has been to [employer] [facility] the [place] .  Physical Exam  General:  alert.   Head:  normocephalic.   Eyes:  glasses Lungs:  normal breath sounds.   Heart:  normal rate and regular rhythm.   Abdomen:  normal bowel sounds.   Msk:  up to the exam table Neurologic:  alert & oriented X3.   Skin:  color normal.   Cervical Nodes:  no  posterior cervical adenopathy.   Psych:  Oriented X3.    Diabetes Management Exam:    Foot Exam (with socks and/or shoes not present):       Sensory-Monofilament:          Left foot: normal          Right foot: normal   Impression & Recommendations:  Problem # 1:  DIABETES MELLITUS, TYPE I (ICD-250.01)  Brittle diabetic still reviewed with pt stategies to control frequent fluctuations in Blood sugar  His updated medication list for this problem includes:    Lisinopril 40 Mg Tabs (Lisinopril) ..... One tablet by mouth daily for  blood pressure    Humalog 100 Unit/ml Soln (Insulin lispro (human)) ..... Use according to the sliding scale    Lantus 100 Unit/ml Soln (Insulin glargine) .Marland KitchenMarland KitchenMarland KitchenMarland Kitchen 15 units subcutaneously nightly  Orders: UA Dipstick w/o Micro (manual) (16109) Hemoglobin A1C (83036)  Problem # 2:  HYPERTENSION, BENIGN ESSENTIAL (ICD-401.1) Pt to take one tablet by mouth daily for BP aware that his bp is elevated DASH diet His updated medication list for this problem includes:    Lisinopril 40 Mg Tabs (Lisinopril) ..... One tablet by mouth daily for blood pressure  Problem # 3:  HYPOTHYROIDISM (ICD-244.9)  His updated medication list for this problem includes:    Levothroid 150 Mcg Tabs (Levothyroxine sodium) ..... One tablet by mouth daily  **note change in dose**  Problem # 4:  DEPRESSION (ICD-311) will increase zoloft. pt will likely need another agent His updated medication list for this problem includes:    Sertraline Hcl 100 Mg Tabs (Sertraline hcl) .Marland Kitchen..Marland Kitchen Two  tablets by mouth daily for mood  Problem # 5:  HYPERCHOLESTEROLEMIA (ICD-272.0)  His updated medication list for this problem includes:    Lipitor 40 Mg Tabs (Atorvastatin calcium) ..... One tablet by mouth nightly for cholesterol  Complete Medication List: 1)  Lisinopril 40 Mg Tabs (Lisinopril) .... One tablet by mouth daily for blood pressure 2)  Levothroid 150 Mcg Tabs (Levothyroxine sodium) ....  One tablet by mouth daily  **note change in dose** 3)  Lipitor 40 Mg Tabs (Atorvastatin calcium) .... One tablet by mouth nightly for cholesterol 4)  Humalog 100 Unit/ml Soln (Insulin lispro (human)) .... Use according to the sliding scale 5)  Lantus 100 Unit/ml Soln (Insulin glargine) .Marland Kitchen.. 15 units subcutaneously nightly 6)  Sertraline Hcl 100 Mg Tabs (Sertraline hcl) .... Two  tablets by mouth daily for mood 7)  Blood Glucose Test Strp (Glucose blood) .... Use to test blood sugar at least 4 times per day **brittle diabetic** 8)  Blood Glucose Meter Kit (Blood glucose monitoring suppl) .... Use to check blood sugar at least 4 times per day 9)  Lancets Misc (Lancets) .... Use to check blood sugar four times per day **brittle diabetic**  Other Orders: Capillary Blood Glucose/CBG (60454)  Diabetes Management Assessment/Plan:      The following lipid goals have been established for the patient: Total cholesterol goal of 200; LDL cholesterol goal of 100; HDL cholesterol goal of 40; Triglyceride goal of 150.  His blood pressure goal is < 130/80.    Hypertension Assessment/Plan:      The patient's hypertensive risk group is category C: Target organ damage and/or diabetes.  His calculated 10 year risk of coronary heart disease is 14 %.  Today's blood pressure is 146/82.  His blood pressure goal is < 130/80.  Lipid Assessment/Plan:      Based on NCEP/ATP III, the patient's risk factor category is "history of diabetes".  The patient's lipid goals are as follows: Total cholesterol goal is 200; LDL cholesterol goal is 100; HDL cholesterol goal is 40; Triglyceride goal is 150.    Patient Instructions: 1)  Diabetes - You are a brittle diabetes. 2)  You need to eat more protein with your snacks during the day. 3)  Blood pressure -  4)  Still slightly elevated.   5)  Mood - increase zoloft to 100mg  - 2 tablets by mouth daily. 6)  I will need to call the pharmacy and see what additional medications we can  get through that pharmacy.  This office will call you back and let you know what to add 7)  Follow up in this office in 2 weeks for diabetes. 8)  Bring your blood pressure log into this office.   9)  Check your blood sugar at least 6 times per day and bring to this office. Prescriptions: LANTUS 100 UNIT/ML SOLN (INSULIN GLARGINE) 15 units subcutaneously nightly  #1 month qs x 11   Entered and Authorized by:   Lehman Prom FNP   Signed by:   Lehman Prom FNP on 09/23/2010   Method used:   Print then Give to Patient   RxID:   [id number] HUMALOG 100 UNIT/ML SOLN (INSULIN LISPRO (HUMAN)) Use according to the sliding scale  #1 month qs x 11   Entered and Authorized by:   Lehman Prom FNP   Signed by:   Lehman Prom FNP on 09/23/2010   Method used:   Print then Give to Patient   RxID:   [id number] LIPITOR 40 MG TABS (ATORVASTATIN CALCIUM) One tablet by mouth nightly for cholesterol  #30 x 11   Entered and Authorized by:   Lehman Prom FNP   Signed by:   Lehman Prom FNP on 09/23/2010   Method used:   Print then Give to Patient   RxID:   [id number] LEVOTHROID 150 MCG TABS (LEVOTHYROXINE SODIUM) One tablet by mouth daily  **note change in dose**  #30 x 11   Entered and Authorized by:   Lehman Prom FNP   Signed by:   Lehman Prom FNP on 09/23/2010   Method used:   Print then Give to Patient   RxID:   [id number] SERTRALINE HCL 100 MG TABS (SERTRALINE HCL) Two  tablets by mouth daily for mood  #60 x 11   Entered and Authorized by:   Lehman Prom FNP   Signed by:   Lehman Prom FNP on 09/23/2010   Method used:   Print then Give to Patient   RxID:   [id number] LISINOPRIL 40 MG TABS (LISINOPRIL) One tablet by mouth daily for blood pressure  #30 x 11   Entered and Authorized by:   Lehman Prom FNP   Signed by:   Lehman Prom FNP on 09/23/2010   Method used:   Faxed to ...       [facility]  (retail)       [address] [place], [place]  [address]       Ph: [phone]       Fax: [phone]   RxID:   [id number]    Orders Added: 1)  Capillary Blood Glucose/CBG [82948] 2)  Est. Patient Level IV [96789] 3)  UA Dipstick w/o Micro (manual) [81002] 4)  Hemoglobin A1C [83036]     Last LDL:                                                 126 (06/11/2010 11:28:00 PM)        Diabetic Foot Exam    10-g (5.07) Semmes-Weinstein Monofilament Test Performed by: Levon Hedger          Right Foot          Left Foot Visual Inspection               Test Control  normal         normal Site 1         normal         normal Site 2         normal         normal Site 3         normal         normal Site 4         normal         normal Site 5         normal         normal Site 6         normal         normal Site 7         normal         normal Site 8         normal         normal Site 9         normal         normal Site 10         normal         normal  Impression      normal         normal   Laboratory Results   Urine Tests  Date/Time Received: September 23, 2010 12:09 PM   Routine Urinalysis   Color: lt. yellow Glucose: 250   (Normal Range: Negative) Bilirubin: small   (Normal Range: Negative) Ketone: smal (15)   (Normal Range: Negative) Spec. Gravity: >=1.030   (Normal Range: 1.003-1.035) Blood: negative   (Normal Range: Negative) pH: 5.5   (Normal Range: 5.0-8.0) Protein: trace   (Normal Range: Negative) Urobilinogen: 0.2   (Normal Range: 0-1) Nitrite: negative   (Normal Range: Negative) Leukocyte Esterace: negative   (Normal Range: Negative)     Blood Tests   Date/Time Received: September 23, 2010 2:51 PM   HGBA1C: 6.7%   (Normal Range: Non-Diabetic - 3-6%   Control Diabetic - 6-8%) CBG Random:: 103

## 2010-10-08 NOTE — Miscellaneous (Signed)
Summary: New Rx  Clinical Lists Changes Phone Note Outgoing Call   Summary of Call: Rx for abilify sent to the [facility] pharmacy. He will take this in addition to sertralazine Notify the pt - he may need to check with the pharmacy about getting the meds He may need to complete some additional paperwork but he will need to check with the pharmacy about this medication Initial call taken by: Lehman Prom FNP,  September 29, 2010 5:10 PM  Follow-up for Phone Call        Reginald Gutierrez  September 30, 2010 4:00 PM Left message on machine for pt to return call to the office.  pt informed of above information. Follow-up by: Reginald Gutierrez,  September 30, 2010 4:18 PM    New/Updated Medications: ABILIFY 5 MG TABS (ARIPIPRAZOLE) One tablet by mouth daily for depression  Medications: Added new medication of ABILIFY 5 MG TABS (ARIPIPRAZOLE) One tablet by mouth daily for depression - Signed Rx of ABILIFY 5 MG TABS (ARIPIPRAZOLE) One tablet by mouth daily for depression;  #30 x 5;  Signed;  Entered by: Lehman Prom FNP;  Authorized by: Lehman Prom FNP;  Method used: Faxed to [facility], [address] [place], [place]  [address], Ph: [phone], Fax: [phone]    Prescriptions: ABILIFY 5 MG TABS (ARIPIPRAZOLE) One tablet by mouth daily for depression  #30 x 5   Entered and Authorized by:   Lehman Prom FNP   Signed by:   Lehman Prom FNP on 09/29/2010   Method used:   Faxed to ...       [facility] (retail)       [address] [place], [place]  [address]       Ph: [phone]       Fax: [phone]   RxID:   [id number]

## 2010-10-22 ENCOUNTER — Encounter (INDEPENDENT_AMBULATORY_CARE_PROVIDER_SITE_OTHER): Payer: Self-pay | Admitting: Nurse Practitioner

## 2010-10-28 ENCOUNTER — Encounter (INDEPENDENT_AMBULATORY_CARE_PROVIDER_SITE_OTHER): Payer: Self-pay | Admitting: Nurse Practitioner

## 2010-10-28 ENCOUNTER — Encounter: Payer: Self-pay | Admitting: Nurse Practitioner

## 2010-10-28 LAB — CONVERTED CEMR LAB: Glucose, Bld: 92 mg/dL

## 2010-11-06 ENCOUNTER — Encounter (INDEPENDENT_AMBULATORY_CARE_PROVIDER_SITE_OTHER): Payer: Self-pay | Admitting: Nurse Practitioner

## 2010-11-06 NOTE — Medication Information (Signed)
Summary: [facility]  [facility]   Imported By: Arta Bruce 10/29/2010 11:15:33  _____________________________________________________________________  External Attachment:    Type:   Image     Comment:   External Document

## 2010-11-06 NOTE — Assessment & Plan Note (Signed)
Summary: Diabetes   Vital Signs:  Patient profile:   45 year old male Weight:      185.2 pounds BMI:     31.41 Temp:     97.8 degrees F oral Pulse rate:   72 / minute Pulse rhythm:   regular Resp:     16 per minute BP sitting:   127 / 72  (left arm) Cuff size:   regular  Vitals Entered By: Levon Hedger (October 28, 2010 10:34 AM)  Nutrition Counseling: Patient's BMI is greater than 25 and therefore counseled on weight management options. CC: follow-up visit dm...headache with hoarness and feeling bad x 1 week has been taking mucinex but nothing is coming up, Hypertension Management, Depression, Lipid Management Is Patient Diabetic? Yes Pain Assessment Patient in pain? yes     Location: head Intensity: 6  Does patient need assistance? Functional Status Self care Ambulation Normal Comments pt did not bring medications today.   CC:  follow-up visit dm...headache with hoarness and feeling bad x 1 week has been taking mucinex but nothing is coming up, Hypertension Management, Depression, and Lipid Management.  History of Present Illness:  Pt into the office for routine diabetes f/u but he also has some sinus and upper respiratory problems today.  He has been taking some mucinex DM for the symptoms  Diabetes - pt presents today with his glucometer  Blood pressure - presents today with blood pressure log.   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 He had an episode of hypoglycemia and the position lasted one week.  EMS was called and once pt was treated he refused to go to the hospital. He is trying to eat more small meals and he has also increased his evercise The patient denies that he feels like life is not worth living, denies that he wishes that he were dead, and denies that he has thought about ending his life.        Comments:  Pt has only been taking abilify as ordered. He has added  there to zoloft 100mg  per day.  .  Depression Treatment History:  Prior Medication Used:   Start Date: Assessment of Effect:   Comments:  Zoloft (sertraline)     05/14/2010   much improvement     --  Hypertension History:      He complains of headache, but denies chest pain and palpitations.  He notes no problems with any antihypertensive medication side effects.        Positive major cardiovascular risk factors include diabetes, hyperlipidemia, hypertension, and current tobacco user.  Negative major cardiovascular risk factors include male age less than 71 years old.        Further assessment for target organ damage reveals no history of ASHD, cardiac end-organ damage (CHF/LVH), stroke/TIA, peripheral vascular disease, renal insufficiency, or hypertensive retinopathy.    Lipid Management History:      Positive NCEP/ATP III risk factors include diabetes, current tobacco user, and hypertension.  Negative NCEP/ATP III risk factors include male age less than 68 years old, HDL cholesterol greater than 60, no ASHD (atherosclerotic heart disease), no prior stroke/TIA, and no peripheral vascular disease.        The patient states that he knows about the "Therapeutic Lifestyle Change" diet.  The patient does not know about adjunctive measures for cholesterol lowering.  He notes side effects from his lipid-lowering medication.  The patient notes symptoms to suggest myopathy or liver disease.       Habits & Providers  Alcohol-Tobacco-Diet     Alcohol drinks/day: 0     Tobacco Status: current     Tobacco Counseling: to quit use of tobacco products     Cigarette Packs/Day: 0.5     Year Started: age 76  Exercise-Depression-Behavior     Does Patient Exercise: yes     Exercise Counseling: to improve exercise regimen     Type of exercise: walking     Exercise (avg: min/session): <30     Times/week: 3     Depression Counseling: further diagnostic testing and/or other treatment is indicated      Drug Use: past  Allergies (verified): No Known Drug Allergies  Social History: Does Patient Exercise:  yes  Review of Systems General:  Denies fever. ENT:  Complains of hoarseness and sinus pressure. CV:  Denies chest pain or discomfort. Resp:  Complains of cough. GI:  Denies abdominal pain. Neuro:  Complains of headaches.  Physical Exam  General:  alert.   Head:  normocephalic.   Eyes:  glasses Ears:  bil TM with clear fluid pressure Mouth:  tonsillar enlargement +1 Lungs:  normal breath sounds.   Heart:  normal rate and regular rhythm.   Msk:  up to the exam table Neurologic:  alert & oriented X3.   Psych:  Oriented X3.     Impression & Recommendations:  Problem # 1:  DIABETES MELLITUS, TYPE I (ICD-250.01) last hgba1c = 6.7 in 08/2010 advised pt check blood sugar four times per day still pt needs to eat small meals and exercise to decrease hypoglycemic episodes His updated medication list for this problem includes:    Lisinopril 40 Mg Tabs (Lisinopril) ..... One tablet by mouth daily for blood pressure    Humalog 100 Unit/ml Soln (Insulin lispro (human)) ..... Use according to the sliding scale    Lantus 100 Unit/ml Soln (Insulin glargine) .Marland KitchenMarland KitchenMarland KitchenMarland Kitchen 15 units subcutaneously nightly  Orders: Capillary Blood Glucose/CBG (91478)  Problem # 2:  HYPERTENSION, BENIGN ESSENTIAL (ICD-401.1) BP is doing well pt encouraged to have a DASH diet continue current meds His updated medication list for this problem includes:    Lisinopril 40 Mg Tabs (Lisinopril) ..... One tablet by mouth daily for blood pressure  Problem # 3:  HYPOTHYROIDISM (ICD-244.9) will check labs on next visit His updated medication list for this problem includes:    Levothroid 150 Mcg Tabs (Levothyroxine sodium) ..... One tablet by mouth daily  **note change in dose**  Problem # 4:  HYPERCHOLESTEROLEMIA (ICD-272.0) conitnue current meds His updated medication list for this problem includes:    Lipitor 40  Mg Tabs (Atorvastatin calcium) ..... One tablet by mouth nightly for cholesterol  Problem # 5:  DEPRESSION (ICD-311) pt has added abilify as per his last visit. Advised pt to take zoloft 100mg  - 2 tablets in the morning His updated medication list for this problem includes:    Sertraline Hcl 100 Mg Tabs (Sertraline hcl) .Marland Kitchen..Marland Kitchen Two  tablets by mouth daily for mood  Problem # 6:  TOBACCO ABUSE (ICD-305.1) advised cessation  Complete Medication List: 1)  Lisinopril 40 Mg Tabs (Lisinopril) .... One tablet by mouth daily for blood pressure 2)  Levothroid 150 Mcg Tabs (Levothyroxine sodium) .... One tablet by mouth daily  **note change in dose** 3)  Lipitor 40 Mg Tabs (Atorvastatin calcium) .... One tablet by mouth nightly for cholesterol 4)  Humalog 100  Unit/ml Soln (Insulin lispro (human)) .... Use according to the sliding scale 5)  Lantus 100 Unit/ml Soln (Insulin glargine) .Marland Kitchen.. 15 units subcutaneously nightly 6)  Sertraline Hcl 100 Mg Tabs (Sertraline hcl) .... Two  tablets by mouth daily for mood 7)  Blood Glucose Test Strp (Glucose blood) .... Use to test blood sugar at least 4 times per day **brittle diabetic** 8)  Blood Glucose Meter Kit (Blood glucose monitoring suppl) .... Use to check blood sugar at least 4 times per day 9)  Lancets Misc (Lancets) .... Use to check blood sugar four times per day **brittle diabetic** 10)  Abilify 5 Mg Tabs (Aripiprazole) .... One tablet by mouth daily for depression 11)  Amoxicillin 500 Mg Tabs (Amoxicillin) .... One tablets by mouth three times a day for infection 12)  Rhinocort Aqua 32 Mcg/act Susp (Budesonide) .... One spray in each nostril two times a day (hold head down)  Hypertension Assessment/Plan:      The patient's hypertensive risk group is category C: Target organ damage and/or diabetes.  His calculated 10 year risk of coronary heart disease is 9 %.  Today's blood pressure is 127/72.  His blood pressure goal is < 130/80.  Lipid  Assessment/Plan:      Based on NCEP/ATP III, the patient's risk factor category is "history of diabetes".  The patient's lipid goals are as follows: Total cholesterol goal is 200; LDL cholesterol goal is 100; HDL cholesterol goal is 40; Triglyceride goal is 150.    Patient Instructions: 1)  Diabetes - continue taking blood sugar as ordered 2)  Blood pressure - doing better.  Keep working on the exercise to decrease the blood pressure.  Continue to write the blood pressure down and bring to the next office visit. 3)  Depression -Take zoloft 100mg  - TWO tablets in the morning. 4)  Continue to take the abilify as ordered 5)  Sinus infection will be treated with biaxin 6)  Follow up in 2 months for diabetes and blood pressure. 7)  You will need tsh, free t3 and free t4 Prescriptions: RHINOCORT AQUA 32 MCG/ACT SUSP (BUDESONIDE) One spray in each nostril two times a day (hold head down)  #1 x 11   Entered and Authorized by:   Lehman Prom FNP   Signed by:   Lehman Prom FNP on 10/28/2010   Method used:   Faxed to ...       [facility] (retail)       [address] [place], [place]  [address]       Ph: [phone]       Fax: [phone]   RxID:   [id number] AMOXICILLIN 500 MG TABS (AMOXICILLIN) One tablets by mouth three times a day for infection  #30 x 0   Entered and Authorized by:   Lehman Prom FNP   Signed by:   Lehman Prom FNP on 10/28/2010   Method used:   Historical   RxID:   [id number] HUMALOG 100 UNIT/ML SOLN (INSULIN LISPRO (HUMAN)) Use according to the sliding scale  #1 month qs x 11   Entered and Authorized by:   Lehman Prom FNP   Signed by:   Lehman Prom FNP on 10/28/2010   Method used:   Faxed to ...       [facility] - [facility] (retail)       [address] [place], [place]  [address]       Ph: [phone]       Fax: [phone]   RxID:    [id number] LANTUS 100 UNIT/ML SOLN (INSULIN GLARGINE) 15 units subcutaneously nightly  #1 month qs x 11   Entered and Authorized by:   Lehman Prom FNP   Signed by:   Lehman Prom FNP on 10/28/2010   Method used:   Faxed to ...       [facility] - [facility] (retail)       [address] [place], [place]  [address]       Ph: [phone]       Fax: [phone]   RxID:   [id number] LISINOPRIL 40 MG TABS (LISINOPRIL) One tablet by mouth daily for blood pressure  #30 x 11   Entered and Authorized by:   Lehman Prom FNP   Signed by:   Lehman Prom FNP on 10/28/2010   Method used:   Faxed to ...       [facility] - Pharmac (retail)       [address] [place], [place]  [address]       Ph: [phone]       Fax: [phone]   RxID:   [id number] SERTRALINE HCL 100 MG TABS (SERTRALINE HCL) Two  tablets by mouth daily for mood  #60 x 11   Entered and Authorized by:   Lehman Prom FNP   Signed by:   Lehman Prom FNP on 10/28/2010   Method used:   Faxed to ...       [facility] - Pharmac (retail)       [address] [place], [place]  [address]       Ph: [phone]       Fax: [phone]   RxID:   [id number] LEVOTHROID 150 MCG TABS (LEVOTHYROXINE SODIUM) One tablet by mouth daily  **note change in dose**  #30 x 11   Entered and Authorized by:   Lehman Prom FNP   Signed by:   Lehman Prom FNP on 10/28/2010   Method used:   Faxed to ...       [facility] - Pharmac (retail)       [address] [place], [place]  [address]       Ph: [phone]       Fax: [phone]   RxID:   [id number] LIPITOR 40 MG TABS (ATORVASTATIN CALCIUM) One tablet by mouth nightly for cholesterol  #30 x 11   Entered and Authorized by:   Lehman Prom FNP   Signed by:   Lehman Prom FNP on 10/28/2010   Method used:    Faxed to ...       [facility] - Pharmac (retail)       [address] [place], [place]  [address]       Ph: [phone]       Fax: [phone]   RxID:   [id number]    Orders Added: 1)  Est. Patient Level IV [24268] 2)  Capillary Blood Glucose/CBG [82948]    Laboratory Results   Blood Tests   Date/Time Received: October 28, 2010 11:37 AM   Glucose (random): 92 mg/dL   (Normal Range: 34-196)

## 2010-11-11 NOTE — Letter (Signed)
Summary: MAILED REQUESTED RECORDS TO [facility]  MAILED REQUESTED RECORDS TO [facility]   Imported By: Arta Bruce 11/06/2010 15:17:51  _____________________________________________________________________  External Attachment:    Type:   Image     Comment:   External Document

## 2010-11-13 ENCOUNTER — Encounter (INDEPENDENT_AMBULATORY_CARE_PROVIDER_SITE_OTHER): Payer: Self-pay | Admitting: Nurse Practitioner

## 2010-11-15 LAB — BASIC METABOLIC PANEL
BUN: 10 mg/dL (ref 6–23)
Chloride: 101 mEq/L (ref 96–112)
GFR calc Af Amer: >60 mL/min (ref >60)
GFR calc non Af Amer: >60 mL/min (ref >60)
Potassium: 4.6 mEq/L (ref 3.5–5.1)
Sodium: 139 mEq/L (ref 135–145)

## 2010-11-15 LAB — CBC
MCHC: 33.4 g/dL (ref 30.0–36.0)
Platelets: 513 10*3/uL — ABNORMAL HIGH (ref 150–400)
RDW: 14.8 % (ref 11.5–15.5)

## 2010-11-15 LAB — DIFFERENTIAL
Basophils Absolute: 0 10*3/uL (ref 0.0–0.1)
Basophils Relative: 0 % (ref 0–1)
Eosinophils Relative: 2 % (ref 0–5)
Lymphocytes Relative: 16 % (ref 12–46)
Neutro Abs: 7.7 10*3/uL (ref 1.7–7.7)

## 2010-11-15 LAB — GLUCOSE, CAPILLARY: Glucose-Capillary: 55 mg/dL — ABNORMAL LOW (ref 70–99)

## 2010-11-16 LAB — GLUCOSE, CAPILLARY: Glucose-Capillary: 116 mg/dL — ABNORMAL HIGH (ref 70–99)

## 2010-11-18 NOTE — Letter (Signed)
Summary: FAXED QUESTIONNAIRE//[facility]  FAXED QUESTIONNAIRE//[facility]   Imported By: Arta Bruce 11/13/2010 15:07:37  _____________________________________________________________________  External Attachment:    Type:   Image     Comment:   External Document

## 2011-03-05 ENCOUNTER — Encounter (INDEPENDENT_AMBULATORY_CARE_PROVIDER_SITE_OTHER): Payer: Self-pay | Admitting: General Surgery

## 2011-03-11 ENCOUNTER — Encounter (INDEPENDENT_AMBULATORY_CARE_PROVIDER_SITE_OTHER): Payer: Self-pay | Admitting: General Surgery

## 2011-03-11 ENCOUNTER — Ambulatory Visit (INDEPENDENT_AMBULATORY_CARE_PROVIDER_SITE_OTHER): Payer: PRIVATE HEALTH INSURANCE | Admitting: General Surgery

## 2011-03-11 VITALS — BP 116/64 | HR 76 | Temp 97.6°F | Ht 68.0 in | Wt 187.6 lb

## 2011-03-11 DIAGNOSIS — F329 Major depressive disorder, single episode, unspecified: Secondary | ICD-10-CM

## 2011-03-11 DIAGNOSIS — E7800 Pure hypercholesterolemia, unspecified: Secondary | ICD-10-CM

## 2011-03-11 DIAGNOSIS — E039 Hypothyroidism, unspecified: Secondary | ICD-10-CM

## 2011-03-11 DIAGNOSIS — F17200 Nicotine dependence, unspecified, uncomplicated: Secondary | ICD-10-CM

## 2011-03-11 DIAGNOSIS — N529 Male erectile dysfunction, unspecified: Secondary | ICD-10-CM

## 2011-03-11 DIAGNOSIS — I10 Essential (primary) hypertension: Secondary | ICD-10-CM

## 2011-03-11 DIAGNOSIS — E109 Type 1 diabetes mellitus without complications: Secondary | ICD-10-CM

## 2011-03-11 DIAGNOSIS — K439 Ventral hernia without obstruction or gangrene: Secondary | ICD-10-CM

## 2011-03-11 MED ORDER — ABDOMINAL BINDER/ELASTIC LARGE MISC: 1.0000 | Freq: Every day | Status: DC

## 2011-03-11 NOTE — Patient Instructions (Signed)
Try to lose weight, stop smoking, and keep diabetes under control  Hernia A hernia occurs when an internal organ pushes out through a weak spot in the belly (abdominal) wall. Hernias most commonly occur in the groin and around the navel. Hernias also can occur through by cut (incision) made by the surgeon after an abdominal operation. Hernias often can be pushed back into place (reduced). Most hernias tend to get worse over time. Problems occur when abdominal contents get stuck in the opening (incarcerated hernia). The blood supply becomes blocked or impaired (strangulated hernia). Because of these risks, you may require surgery to repair the hernia. CAUSES  Heavy lifting.   Prolonged coughing.   Straining to move your bowels.   Hernias can also occur through a cut (incision) by a surgeon after an abdominal operation.  HOME CARE INSTRUCTIONS  Bed rest is not required. You may continue your normal activities. Avoid heavy lifting (more than 10 pounds) or straining. Cough gently. If you are a smoker it is best to stop. Even the best hernia repair can break down with the continual strain of coughing. Even if you do not have your hernia repaired, a cough will continue to aggravate the problem.   Eat a normal diet. Avoid constipation. Straining over long periods of time will increase hernia size and encourage breakdown of repairs. If you cannot do this with diet alone, stool softeners may be used.  SEEK IMMEDIATE MEDICAL CARE IF: You have problems (symptoms) of a trapped (incarcerated) hernia:  You develop an oral temperature above 101.5  or as your caregiver suggests.   You develop increasing abdominal pain.   You feel sick to your stomach (nausea) and vomiting.   The hernia is stuck outside the abdomen, looks discolored, feels hard, or is tender.   You have any changes in your bowel habits or in the hernia that is unusual for you.   You have increased pain or swelling around the hernia.     You cannot push the hernia back in place by applying gentle pressure while lying down.  MAKE SURE YOU:   Understand these instructions.   Will watch your condition.   Will get help right away if you are not doing well or get worse.  Document Released: 08/17/2005 Document Re-Released: 06/14/2009 [facility] Patient Information 2011 [place], [place].

## 2011-03-11 NOTE — Progress Notes (Signed)
Subjective:     Patient ID: Reginald Gutierrez, male   DOB: 01-18-1966, 45 y.o.   MRN: [id number]    BP 116/64  Pulse 76  Temp 97.6 F (36.4 C)  Ht 5\' 8"  (1.727 m)  Wt 187 lb 9.6 oz (85.095 kg)  BMI 28.52 kg/m32    HPI 45 year old Caucasian male with diabetes mellitus type 1, tobacco abuse, hyperlipidemia, hypothyroidism, hypertension, depression who complains of an umbilical hernia for the past several months. He believes it has gotten larger over the past couple months. It bothers him because it bulges out. He denies any nausea, vomiting, diarrhea, or constipation. He denies any abdominal pain. It has never been painful to touch. He has gained about 10 pounds over the past several months. The only previous abdominal surgery has been an open appendectomy. He is a smoker. He currently does not have an endocrinologist. He believes his last hemoglobin A1c was around 7.  Past Medical History  Diagnosis Date  . Diabetes mellitus type 1   . Hyperlipidemia   . Hypothyroidism   . Depression   . Umbilical hernia    Past Surgical History  Procedure Date  . Appendectomy     open   No Known Allergies Current Outpatient Prescriptions  Medication Sig Dispense Refill  . ARIPiprazole (ABILIFY) 5 MG tablet Take 5 mg by mouth daily.        Marland Kitchen atorvastatin (LIPITOR) 40 MG tablet Take 40 mg by mouth daily.        . ciclesonide (OMNARIS) 50 MCG/ACT nasal spray Place 2 sprays into both nostrils daily.        . insulin glargine (LANTUS) 100 UNIT/ML injection Inject 0.1 Units into the skin at bedtime.        . insulin lispro (HUMALOG) 100 UNIT/ML injection Inject into the skin 3 (three) times daily before meals.        Marland Kitchen levothyroxine (SYNTHROID, LEVOTHROID) 50 MCG tablet Take 50 mcg by mouth daily.        Marland Kitchen lisinopril (PRINIVIL,ZESTRIL) 40 MG tablet Take 40 mg by mouth daily.        . sertraline (ZOLOFT) 100 MG tablet Take 100 mg by mouth daily. Two tablets once daily       . cefUROXime (CEFTIN) 500 MG  tablet Take 500 mg by mouth 2 (two) times daily.        . clarithromycin (BIAXIN) 500 MG tablet Take 500 mg by mouth 2 (two) times daily.        . Elastic Bandages & Supports (ABDOMINAL BINDER/ELASTIC LARGE) MISC 1 Device by Does not apply route daily.  2 each  2  . promethazine-dextromethorphan (PROMETHAZINE-DM) 6.25-15 MG/5ML syrup Take by mouth 4 (four) times daily as needed.          Review of Systems  Constitutional: Positive for unexpected weight change (10lb wt gain). Negative for activity change and appetite change.  HENT: Negative.   Eyes:       Decreased vision due to DM  Respiratory: Positive for cough. Negative for shortness of breath.   Cardiovascular: Negative for chest pain and leg swelling.  Gastrointestinal: Negative for nausea, vomiting, diarrhea and constipation.       Umbilical hernia  Genitourinary: Negative for frequency and difficulty urinating.  Musculoskeletal: Positive for arthralgias.  Skin: Negative.   Neurological: Positive for light-headedness (when hypoglycemic). Negative for seizures, speech difficulty and headaches.  Hematological: Negative.   Psychiatric/Behavioral: Negative for suicidal ideas and self-injury.  Depression & anxiety       Objective:   Physical Exam  Constitutional: He is oriented to person, place, and time. He appears well-developed and well-nourished.  HENT:  Head: Normocephalic and atraumatic.  Eyes: Conjunctivae are normal.       GLASSES  Neck: Normal range of motion. No tracheal deviation present.  Cardiovascular: Normal rate, regular rhythm and normal heart sounds.   Pulmonary/Chest: Effort normal and breath sounds normal. He has no wheezes.  Abdominal: Soft. He exhibits no distension. There is no tenderness. A hernia is present. Hernia confirmed positive in the ventral area.    Musculoskeletal: Normal range of motion.  Neurological: He is alert and oriented to person, place, and time.  Skin: Skin is warm and dry.    Psychiatric: He has a normal mood and affect. His behavior is normal. Judgment normal.      Data reviewed I reviewed his referring physicians medical notes from [facility] [place], [place] Assessment:     45 year old male with tobacco abuse, obesity, hypertension, diabetes mellitus type 1 with a ventral hernia. Plan:     We discussed the etiology of ventral hernias. The patient was given educational material. We discussed the signs and symptoms of incarceration and strangulation. We discussed both operative and nonoperative management. We specifically discussed a laparoscopic repair. We also briefly discussed an open repair. We discussed the risk and benefits of surgery including but not limited to bleeding, infection, injury to surrounding structures, hernia recurrence, DVT and recurrence postoperative pain, chronic pain, postoperative ileus, and anesthesia risk. I explained to them that he is at higher risk for recurrence given his weight and his diabetes and his tobacco abuse. We discussed trying to lose weight through diet and exercise as well as stopping smoking to decrease his risk of recurrence as well as risk for perioperative complications. We also talked about wearing an abdominal binder. The patient is interested in wearing an abdominal binder and would like to do several months of diet and exercise as well as to try to stop smoking. We will see him back in 3 months time to see how he is doing. I advised him to call the office or return should he develop abdominal pain or signs and symptoms of incarceration or strangulation.

## 2011-05-10 ENCOUNTER — Encounter (HOSPITAL_BASED_OUTPATIENT_CLINIC_OR_DEPARTMENT_OTHER): Payer: Self-pay | Admitting: *Deleted

## 2011-05-10 ENCOUNTER — Emergency Department (INDEPENDENT_AMBULATORY_CARE_PROVIDER_SITE_OTHER): Payer: No Typology Code available for payment source

## 2011-05-10 ENCOUNTER — Emergency Department (HOSPITAL_BASED_OUTPATIENT_CLINIC_OR_DEPARTMENT_OTHER)
Admission: EM | Admit: 2011-05-10 | Discharge: 2011-05-10 | Disposition: A | Discharge status: Home / Self Care | Payer: Self-pay | Attending: Emergency Medicine | Admitting: Emergency Medicine

## 2011-05-10 DIAGNOSIS — E785 Hyperlipidemia, unspecified: Secondary | ICD-10-CM | POA: Insufficient documentation

## 2011-05-10 DIAGNOSIS — E039 Hypothyroidism, unspecified: Secondary | ICD-10-CM | POA: Insufficient documentation

## 2011-05-10 DIAGNOSIS — S52539A Colles' fracture of unspecified radius, initial encounter for closed fracture: Secondary | ICD-10-CM | POA: Insufficient documentation

## 2011-05-10 DIAGNOSIS — W19XXXA Unspecified fall, initial encounter: Secondary | ICD-10-CM

## 2011-05-10 DIAGNOSIS — S52609A Unspecified fracture of lower end of unspecified ulna, initial encounter for closed fracture: Secondary | ICD-10-CM | POA: Insufficient documentation

## 2011-05-10 DIAGNOSIS — S52509A Unspecified fracture of the lower end of unspecified radius, initial encounter for closed fracture: Secondary | ICD-10-CM

## 2011-05-10 DIAGNOSIS — E109 Type 1 diabetes mellitus without complications: Secondary | ICD-10-CM | POA: Insufficient documentation

## 2011-05-10 DIAGNOSIS — I10 Essential (primary) hypertension: Secondary | ICD-10-CM | POA: Insufficient documentation

## 2011-05-10 HISTORY — DX: Essential (primary) hypertension: I10

## 2011-05-10 LAB — GLUCOSE, CAPILLARY: Glucose-Capillary: 55 mg/dL — ABNORMAL LOW (ref 70–99)

## 2011-05-10 MED ORDER — HYDROCODONE-ACETAMINOPHEN 5-325 MG PO TABS: 2.0000 | ORAL_TABLET | ORAL | Status: AC | PRN

## 2011-05-10 MED ORDER — LEVOTHYROXINE SODIUM 50 MCG PO TABS: 200.0000 ug | ORAL_TABLET | Freq: Every day | ORAL | Status: DC

## 2011-05-10 NOTE — ED Provider Notes (Signed)
History     CSN: [id number] Arrival date & time: 05/10/2011  7:10 PM  Chief Complaint  Patient presents with  . Wrist Pain   Patient is a 45 y.o. male presenting with arm injury.  Arm Injury  The incident occurred yesterday. The incident occurred at home. The injury mechanism was a fall. The context of the injury is unknown. The wounds were not self-inflicted. There is an injury to the right forearm and right wrist. Pertinent negatives include no numbness and no focal weakness. He is right-handed. His tetanus status is UTD. There were no sick contacts. He has received no recent medical care.  Pt complains of pain and swelling to right wrist.  Pt reports his sugar dropped yesterday and he fell.  Pt complains of swelling and pain.   Past Medical History  Diagnosis Date  . Diabetes mellitus type 1   . Hyperlipidemia   . Hypothyroidism   . Depression   . Umbilical hernia   . Hypertension     Past Surgical History  Procedure Date  . Appendectomy     open    History reviewed. No pertinent family history.  History  Substance Use Topics  . Smoking status: Current Everyday Smoker -- 0.5 packs/day    Types: Cigarettes  . Smokeless tobacco: Not on file  . Alcohol Use: No      Review of Systems  Musculoskeletal: Positive for myalgias and joint swelling.  Neurological: Negative for focal weakness and numbness.  All other systems reviewed and are negative.    Physical Exam  BP 135/57  Pulse 84  Temp(Src) 99.5 F (37.5 C) (Oral)  Resp 20  Ht 5\' 7"  (1.702 m)  Wt 180 lb (81.647 kg)  BMI 28.19 kg/m2  SpO2 96%  Physical Exam  Nursing note and vitals reviewed. Constitutional: He appears well-developed and well-nourished.  HENT:  Head: Normocephalic.  Eyes: Pupils are equal, round, and reactive to light.  Neck: Normal range of motion.  Musculoskeletal: He exhibits edema and tenderness.  Neurological: He is alert. He has normal reflexes.  Skin: Skin is warm.    Psychiatric: He has a normal mood and affect.    ED Course  Procedures  MDM fx distal radius and ulna,  Pt has seen Dr. Otelia Sergeant in the past.  i advised pt to see Dr. Otelia Sergeant for recheck this week.      Langston Masker, [place] 05/10/11 2011  Langston Masker, [place] 05/10/11 2019

## 2011-05-10 NOTE — ED Notes (Signed)
Pt's blood sugar was low and was given orange juice and peanut butter and crackers.

## 2011-05-10 NOTE — ED Notes (Signed)
Pt states that he is a participant of [facility] and that his card was expiring and he will be out of his synthroid.  Pt has requested that we refill his synthroid.  He also has requested the "blue" vicodin and that he would not like anything any stronger.  Provider was notified.

## 2011-05-10 NOTE — ED Notes (Signed)
Pt states his blood sugar dropped last p.m. And he fell injuring his right wrist. +radial pulse

## 2011-05-11 NOTE — ED Provider Notes (Signed)
Medical screening examination/treatment/procedure(s) were performed by non-physician practitioner and as supervising physician I was immediately available for consultation/collaboration.   Forbes Cellar, MD 05/11/11 [phone]

## 2011-05-12 ENCOUNTER — Ambulatory Visit
Admission: RE | Admit: 2011-05-12 | Discharge: 2011-05-12 | Disposition: A | Discharge status: Home / Self Care | Payer: Self-pay | Source: Ambulatory Visit | Attending: Specialist | Admitting: Specialist

## 2011-05-12 ENCOUNTER — Other Ambulatory Visit: Payer: Self-pay | Admitting: Specialist

## 2011-05-12 DIAGNOSIS — S62109A Fracture of unspecified carpal bone, unspecified wrist, initial encounter for closed fracture: Secondary | ICD-10-CM

## 2011-05-24 ENCOUNTER — Emergency Department (HOSPITAL_COMMUNITY)
Admission: EM | Admit: 2011-05-24 | Discharge: 2011-05-24 | Disposition: A | Discharge status: Home / Self Care | Payer: Self-pay | Attending: Emergency Medicine | Admitting: Emergency Medicine

## 2011-05-24 DIAGNOSIS — Z794 Long term (current) use of insulin: Secondary | ICD-10-CM | POA: Insufficient documentation

## 2011-05-24 DIAGNOSIS — E1169 Type 2 diabetes mellitus with other specified complication: Secondary | ICD-10-CM | POA: Insufficient documentation

## 2011-05-24 DIAGNOSIS — Z760 Encounter for issue of repeat prescription: Secondary | ICD-10-CM | POA: Insufficient documentation

## 2011-05-24 LAB — GLUCOSE, CAPILLARY: Glucose-Capillary: 94 mg/dL (ref 70–99)

## 2011-05-25 LAB — POCT PREGNANCY, URINE: Preg Test, Ur: POSITIVE

## 2011-06-23 ENCOUNTER — Ambulatory Visit: Payer: No Typology Code available for payment source | Admitting: Endocrinology

## 2011-06-29 ENCOUNTER — Encounter (INDEPENDENT_AMBULATORY_CARE_PROVIDER_SITE_OTHER): Payer: Self-pay | Admitting: General Surgery

## 2011-09-01 HISTORY — PX: FRACTURE SURGERY: SHX138

## 2011-10-02 LAB — HM DIABETES EYE EXAM

## 2011-11-17 ENCOUNTER — Ambulatory Visit (INDEPENDENT_AMBULATORY_CARE_PROVIDER_SITE_OTHER): Payer: Self-pay | Admitting: General Surgery

## 2011-12-11 ENCOUNTER — Encounter (INDEPENDENT_AMBULATORY_CARE_PROVIDER_SITE_OTHER): Payer: Self-pay | Admitting: General Surgery

## 2011-12-16 DIAGNOSIS — I219 Acute myocardial infarction, unspecified: Secondary | ICD-10-CM

## 2011-12-16 HISTORY — DX: Acute myocardial infarction, unspecified: I21.9

## 2011-12-18 ENCOUNTER — Inpatient Hospital Stay (HOSPITAL_COMMUNITY): Payer: Medicare Other

## 2011-12-18 ENCOUNTER — Encounter (HOSPITAL_COMMUNITY): Payer: Self-pay | Admitting: Emergency Medicine

## 2011-12-18 ENCOUNTER — Inpatient Hospital Stay (HOSPITAL_COMMUNITY): Payer: Medicare Other | Admitting: Anesthesiology

## 2011-12-18 ENCOUNTER — Encounter (HOSPITAL_COMMUNITY): Payer: Self-pay | Admitting: Anesthesiology

## 2011-12-18 ENCOUNTER — Encounter (HOSPITAL_COMMUNITY)
Admission: EM | Disposition: A | Discharge status: Other Acute Inpatient Hospital | Payer: Self-pay | Source: Home / Self Care | Attending: Pulmonary Disease

## 2011-12-18 ENCOUNTER — Emergency Department (HOSPITAL_COMMUNITY): Payer: Medicare Other

## 2011-12-18 ENCOUNTER — Inpatient Hospital Stay (HOSPITAL_COMMUNITY)
Admission: EM | Admit: 2011-12-18 | Discharge: 2012-01-07 | DRG: 003 | Disposition: A | Discharge status: Other Acute Inpatient Hospital | Payer: Medicare Other | Attending: Internal Medicine | Admitting: Internal Medicine

## 2011-12-18 DIAGNOSIS — E0781 Sick-euthyroid syndrome: Secondary | ICD-10-CM | POA: Diagnosis present

## 2011-12-18 DIAGNOSIS — R55 Syncope and collapse: Secondary | ICD-10-CM | POA: Diagnosis present

## 2011-12-18 DIAGNOSIS — E7800 Pure hypercholesterolemia, unspecified: Secondary | ICD-10-CM | POA: Diagnosis present

## 2011-12-18 DIAGNOSIS — E46 Unspecified protein-calorie malnutrition: Secondary | ICD-10-CM

## 2011-12-18 DIAGNOSIS — A419 Sepsis, unspecified organism: Secondary | ICD-10-CM | POA: Diagnosis not present

## 2011-12-18 DIAGNOSIS — I959 Hypotension, unspecified: Secondary | ICD-10-CM | POA: Diagnosis not present

## 2011-12-18 DIAGNOSIS — J80 Acute respiratory distress syndrome: Secondary | ICD-10-CM

## 2011-12-18 DIAGNOSIS — K560 Paralytic ileus: Secondary | ICD-10-CM | POA: Diagnosis not present

## 2011-12-18 DIAGNOSIS — G9341 Metabolic encephalopathy: Secondary | ICD-10-CM | POA: Diagnosis not present

## 2011-12-18 DIAGNOSIS — I10 Essential (primary) hypertension: Secondary | ICD-10-CM | POA: Diagnosis present

## 2011-12-18 DIAGNOSIS — J9600 Acute respiratory failure, unspecified whether with hypoxia or hypercapnia: Secondary | ICD-10-CM | POA: Diagnosis not present

## 2011-12-18 DIAGNOSIS — D62 Acute posthemorrhagic anemia: Secondary | ICD-10-CM | POA: Diagnosis present

## 2011-12-18 DIAGNOSIS — F3289 Other specified depressive episodes: Secondary | ICD-10-CM | POA: Diagnosis present

## 2011-12-18 DIAGNOSIS — W19XXXA Unspecified fall, initial encounter: Secondary | ICD-10-CM | POA: Diagnosis present

## 2011-12-18 DIAGNOSIS — Y849 Medical procedure, unspecified as the cause of abnormal reaction of the patient, or of later complication, without mention of misadventure at the time of the procedure: Secondary | ICD-10-CM | POA: Diagnosis not present

## 2011-12-18 DIAGNOSIS — E875 Hyperkalemia: Secondary | ICD-10-CM | POA: Diagnosis present

## 2011-12-18 DIAGNOSIS — E1069 Type 1 diabetes mellitus with other specified complication: Secondary | ICD-10-CM | POA: Diagnosis present

## 2011-12-18 DIAGNOSIS — E872 Acidosis, unspecified: Secondary | ICD-10-CM | POA: Diagnosis not present

## 2011-12-18 DIAGNOSIS — T50995A Adverse effect of other drugs, medicaments and biological substances, initial encounter: Secondary | ICD-10-CM | POA: Diagnosis not present

## 2011-12-18 DIAGNOSIS — S82209A Unspecified fracture of shaft of unspecified tibia, initial encounter for closed fracture: Principal | ICD-10-CM | POA: Diagnosis present

## 2011-12-18 DIAGNOSIS — J690 Pneumonitis due to inhalation of food and vomit: Secondary | ICD-10-CM | POA: Diagnosis not present

## 2011-12-18 DIAGNOSIS — Y998 Other external cause status: Secondary | ICD-10-CM

## 2011-12-18 DIAGNOSIS — E162 Hypoglycemia, unspecified: Secondary | ICD-10-CM

## 2011-12-18 DIAGNOSIS — N170 Acute kidney failure with tubular necrosis: Secondary | ICD-10-CM | POA: Diagnosis not present

## 2011-12-18 DIAGNOSIS — S82401A Unspecified fracture of shaft of right fibula, initial encounter for closed fracture: Secondary | ICD-10-CM

## 2011-12-18 DIAGNOSIS — K7200 Acute and subacute hepatic failure without coma: Secondary | ICD-10-CM | POA: Diagnosis not present

## 2011-12-18 DIAGNOSIS — I509 Heart failure, unspecified: Secondary | ICD-10-CM | POA: Diagnosis present

## 2011-12-18 DIAGNOSIS — I498 Other specified cardiac arrhythmias: Secondary | ICD-10-CM | POA: Diagnosis present

## 2011-12-18 DIAGNOSIS — R579 Shock, unspecified: Secondary | ICD-10-CM | POA: Diagnosis present

## 2011-12-18 DIAGNOSIS — N179 Acute kidney failure, unspecified: Secondary | ICD-10-CM | POA: Diagnosis not present

## 2011-12-18 DIAGNOSIS — S82201A Unspecified fracture of shaft of right tibia, initial encounter for closed fracture: Secondary | ICD-10-CM

## 2011-12-18 DIAGNOSIS — I5021 Acute systolic (congestive) heart failure: Secondary | ICD-10-CM | POA: Diagnosis present

## 2011-12-18 DIAGNOSIS — J189 Pneumonia, unspecified organism: Secondary | ICD-10-CM | POA: Diagnosis not present

## 2011-12-18 DIAGNOSIS — E109 Type 1 diabetes mellitus without complications: Secondary | ICD-10-CM | POA: Diagnosis present

## 2011-12-18 DIAGNOSIS — E039 Hypothyroidism, unspecified: Secondary | ICD-10-CM

## 2011-12-18 DIAGNOSIS — G9340 Encephalopathy, unspecified: Secondary | ICD-10-CM

## 2011-12-18 DIAGNOSIS — I428 Other cardiomyopathies: Secondary | ICD-10-CM | POA: Diagnosis present

## 2011-12-18 DIAGNOSIS — R1310 Dysphagia, unspecified: Secondary | ICD-10-CM | POA: Diagnosis not present

## 2011-12-18 DIAGNOSIS — I2510 Atherosclerotic heart disease of native coronary artery without angina pectoris: Secondary | ICD-10-CM | POA: Diagnosis present

## 2011-12-18 DIAGNOSIS — F17200 Nicotine dependence, unspecified, uncomplicated: Secondary | ICD-10-CM | POA: Diagnosis present

## 2011-12-18 DIAGNOSIS — D638 Anemia in other chronic diseases classified elsewhere: Secondary | ICD-10-CM | POA: Diagnosis present

## 2011-12-18 DIAGNOSIS — I519 Heart disease, unspecified: Secondary | ICD-10-CM | POA: Diagnosis not present

## 2011-12-18 DIAGNOSIS — K567 Ileus, unspecified: Secondary | ICD-10-CM

## 2011-12-18 DIAGNOSIS — I214 Non-ST elevation (NSTEMI) myocardial infarction: Secondary | ICD-10-CM | POA: Diagnosis not present

## 2011-12-18 DIAGNOSIS — F329 Major depressive disorder, single episode, unspecified: Secondary | ICD-10-CM

## 2011-12-18 DIAGNOSIS — R570 Cardiogenic shock: Secondary | ICD-10-CM | POA: Diagnosis not present

## 2011-12-18 HISTORY — PX: TIBIA IM NAIL INSERTION: SHX2516

## 2011-12-18 LAB — BASIC METABOLIC PANEL
BUN: 20 mg/dL (ref 6–23)
Chloride: 101 mEq/L (ref 96–112)
GFR calc Af Amer: >90 mL/min (ref >90)
Glucose, Bld: 238 mg/dL — ABNORMAL HIGH (ref 70–99)
Potassium: 4.7 mEq/L (ref 3.5–5.1)

## 2011-12-18 LAB — SAMPLE TO BLOOD BANK

## 2011-12-18 LAB — DIFFERENTIAL
Basophils Relative: 0 % (ref 0–1)
Lymphs Abs: 1 10*3/uL (ref 0.7–4.0)
Monocytes Relative: 4 % (ref 3–12)
Neutro Abs: 16.1 10*3/uL — ABNORMAL HIGH (ref 1.7–7.7)
Neutrophils Relative %: 91 % — ABNORMAL HIGH (ref 43–77)

## 2011-12-18 LAB — GLUCOSE, CAPILLARY
Glucose-Capillary: 145 mg/dL — ABNORMAL HIGH (ref 70–99)
Glucose-Capillary: 353 mg/dL — ABNORMAL HIGH (ref 70–99)
Glucose-Capillary: 424 mg/dL — ABNORMAL HIGH (ref 70–99)
Glucose-Capillary: 105 mg/dL — ABNORMAL HIGH (ref 70–99)

## 2011-12-18 LAB — CBC
Hemoglobin: 14.5 g/dL (ref 13.0–17.0)
RBC: 4.86 MIL/uL (ref 4.22–5.81)

## 2011-12-18 SURGERY — INSERTION, INTRAMEDULLARY ROD, TIBIA
Anesthesia: General | Site: Leg Lower | Laterality: Bilateral | Wound class: Clean

## 2011-12-18 MED ORDER — SODIUM CHLORIDE 0.9 % IV SOLN
INTRAVENOUS | Status: DC
  Administered 2011-12-18 – 2011-12-19 (×2): via INTRAVENOUS
  Administered 2011-12-19: 75 mL via INTRAVENOUS

## 2011-12-18 MED ORDER — INSULIN GLARGINE 100 UNIT/ML ~~LOC~~ SOLN: 25.0000 [IU] | Freq: Every day | SUBCUTANEOUS | Status: DC

## 2011-12-18 MED ORDER — LABETALOL HCL 5 MG/ML IV SOLN
INTRAVENOUS | Status: DC | PRN
  Administered 2011-12-18: 5 mL via INTRAVENOUS

## 2011-12-18 MED ORDER — ACETAMINOPHEN 10 MG/ML IV SOLN
INTRAVENOUS | Status: DC | PRN
  Administered 2011-12-18: 1000 mg via INTRAVENOUS

## 2011-12-18 MED ORDER — CEFAZOLIN SODIUM-DEXTROSE 2-3 GM-% IV SOLR
INTRAVENOUS | Status: AC
  Filled 2011-12-18: qty 50

## 2011-12-18 MED ORDER — CEFAZOLIN SODIUM 1-5 GM-% IV SOLN
INTRAVENOUS | Status: AC
  Filled 2011-12-18: qty 100

## 2011-12-18 MED ORDER — INSULIN ASPART 100 UNIT/ML ~~LOC~~ SOLN
SUBCUTANEOUS | Status: AC
  Filled 2011-12-18: qty 1

## 2011-12-18 MED ORDER — HYDROMORPHONE HCL PF 1 MG/ML IJ SOLN: 1.0000 mg | INTRAMUSCULAR | Status: DC | PRN

## 2011-12-18 MED ORDER — INSULIN ASPART 100 UNIT/ML ~~LOC~~ SOLN
0.0000 [IU] | Freq: Three times a day (TID) | SUBCUTANEOUS | Status: DC
  Administered 2011-12-18 – 2011-12-19 (×2): 9 [IU] via SUBCUTANEOUS
  Filled 2011-12-18: qty 1

## 2011-12-18 MED ORDER — INSULIN ASPART 100 UNIT/ML ~~LOC~~ SOLN
0.0000 [IU] | Freq: Every day | SUBCUTANEOUS | Status: DC
  Administered 2011-12-18: 5 [IU] via SUBCUTANEOUS

## 2011-12-18 MED ORDER — HYDROMORPHONE HCL PF 1 MG/ML IJ SOLN
1.0000 mg | INTRAMUSCULAR | Status: AC | PRN
  Administered 2011-12-18 (×2): 1 mg via INTRAVENOUS
  Filled 2011-12-18 (×2): qty 1

## 2011-12-18 MED ORDER — PROPOFOL 10 MG/ML IV EMUL
INTRAVENOUS | Status: DC | PRN
  Administered 2011-12-18: 180 mg via INTRAVENOUS

## 2011-12-18 MED ORDER — PROMETHAZINE HCL 25 MG/ML IJ SOLN: 6.2500 mg | INTRAMUSCULAR | Status: DC | PRN

## 2011-12-18 MED ORDER — BUPIVACAINE HCL (PF) 0.5 % IJ SOLN
INTRAMUSCULAR | Status: AC
  Filled 2011-12-18: qty 60

## 2011-12-18 MED ORDER — FENTANYL CITRATE 0.05 MG/ML IJ SOLN
INTRAMUSCULAR | Status: AC
  Filled 2011-12-18: qty 2

## 2011-12-18 MED ORDER — DROPERIDOL 2.5 MG/ML IJ SOLN
INTRAMUSCULAR | Status: DC | PRN
  Administered 2011-12-18: 0.625 mg via INTRAVENOUS

## 2011-12-18 MED ORDER — LACTATED RINGERS IV SOLN
INTRAVENOUS | Status: DC | PRN
  Administered 2011-12-18 (×2): via INTRAVENOUS

## 2011-12-18 MED ORDER — CEFAZOLIN SODIUM 1-5 GM-% IV SOLN
INTRAVENOUS | Status: DC | PRN
  Administered 2011-12-18 (×2): 1 g via INTRAVENOUS

## 2011-12-18 MED ORDER — HYDROMORPHONE HCL PF 1 MG/ML IJ SOLN
0.2500 mg | INTRAMUSCULAR | Status: DC | PRN
  Administered 2011-12-18 – 2011-12-19 (×2): 0.5 mg via INTRAVENOUS

## 2011-12-18 MED ORDER — HYDROMORPHONE HCL PF 1 MG/ML IJ SOLN
INTRAMUSCULAR | Status: DC | PRN
  Administered 2011-12-18 (×2): 1 mg via INTRAVENOUS

## 2011-12-18 MED ORDER — HYDROCODONE-ACETAMINOPHEN 5-325 MG PO TABS: 1.0000 | ORAL_TABLET | ORAL | Status: DC | PRN

## 2011-12-18 MED ORDER — ACETAMINOPHEN 10 MG/ML IV SOLN
INTRAVENOUS | Status: AC
  Filled 2011-12-18: qty 100

## 2011-12-18 MED ORDER — PHENYLEPHRINE HCL 10 MG/ML IJ SOLN
INTRAMUSCULAR | Status: DC | PRN
  Administered 2011-12-18: 160 ug via INTRAVENOUS
  Administered 2011-12-18: 120 ug via INTRAVENOUS
  Administered 2011-12-18: 200 ug via INTRAVENOUS
  Administered 2011-12-18: 120 ug via INTRAVENOUS
  Administered 2011-12-18: 160 ug via INTRAVENOUS

## 2011-12-18 MED ORDER — BUPROPION HCL 100 MG PO TABS
100.0000 mg | ORAL_TABLET | Freq: Two times a day (BID) | ORAL | Status: DC
  Filled 2011-12-18 (×3): qty 1

## 2011-12-18 MED ORDER — HYDROMORPHONE HCL PF 1 MG/ML IJ SOLN
1.0000 mg | INTRAMUSCULAR | Status: AC | PRN
  Administered 2011-12-18 (×3): 1 mg via INTRAVENOUS
  Filled 2011-12-18 (×3): qty 1

## 2011-12-18 MED ORDER — ATORVASTATIN CALCIUM 40 MG PO TABS
40.0000 mg | ORAL_TABLET | Freq: Every day | ORAL | Status: DC
  Administered 2011-12-19 – 2011-12-21 (×3): 40 mg via ORAL
  Filled 2011-12-18 (×5): qty 1

## 2011-12-18 MED ORDER — 0.9 % SODIUM CHLORIDE (POUR BTL) OPTIME
TOPICAL | Status: DC | PRN
  Administered 2011-12-18: 1000 mL

## 2011-12-18 MED ORDER — ONDANSETRON HCL 4 MG/2ML IJ SOLN
4.0000 mg | Freq: Four times a day (QID) | INTRAMUSCULAR | Status: DC | PRN
  Filled 2011-12-18: qty 2

## 2011-12-18 MED ORDER — HYDROMORPHONE HCL PF 1 MG/ML IJ SOLN
INTRAMUSCULAR | Status: AC
  Filled 2011-12-18: qty 1

## 2011-12-18 MED ORDER — FENTANYL CITRATE 0.05 MG/ML IJ SOLN
INTRAMUSCULAR | Status: DC | PRN
  Administered 2011-12-18 (×2): 50 ug via INTRAVENOUS
  Administered 2011-12-18: 100 ug via INTRAVENOUS

## 2011-12-18 MED ORDER — LEVOTHYROXINE SODIUM 200 MCG PO TABS
200.0000 ug | ORAL_TABLET | Freq: Every day | ORAL | Status: DC
  Administered 2011-12-19: 200 ug via ORAL
  Filled 2011-12-18 (×3): qty 1

## 2011-12-18 MED ORDER — LIDOCAINE HCL (CARDIAC) 20 MG/ML IV SOLN
INTRAVENOUS | Status: DC | PRN
  Administered 2011-12-18: 80 mg via INTRAVENOUS

## 2011-12-18 MED ORDER — ENOXAPARIN SODIUM 40 MG/0.4ML ~~LOC~~ SOLN
40.0000 mg | SUBCUTANEOUS | Status: DC
  Filled 2011-12-18 (×2): qty 0.4

## 2011-12-18 MED ORDER — ENOXAPARIN SODIUM 30 MG/0.3ML ~~LOC~~ SOLN: 30.0000 mg | SUBCUTANEOUS | Status: DC

## 2011-12-18 MED ORDER — ONDANSETRON HCL 4 MG/2ML IJ SOLN
INTRAMUSCULAR | Status: DC | PRN
  Administered 2011-12-18: 4 mg via INTRAVENOUS

## 2011-12-18 MED ORDER — ROCURONIUM BROMIDE 100 MG/10ML IV SOLN
INTRAVENOUS | Status: DC | PRN
  Administered 2011-12-18: 5 mg via INTRAVENOUS

## 2011-12-18 MED ORDER — SUCCINYLCHOLINE CHLORIDE 20 MG/ML IJ SOLN
INTRAMUSCULAR | Status: DC | PRN
  Administered 2011-12-18: 100 mg via INTRAVENOUS

## 2011-12-18 MED ORDER — HETASTARCH-ELECTROLYTES 6 % IV SOLN
INTRAVENOUS | Status: DC | PRN
  Administered 2011-12-18 (×2): via INTRAVENOUS

## 2011-12-18 MED ORDER — MIDAZOLAM HCL 5 MG/5ML IJ SOLN
INTRAMUSCULAR | Status: DC | PRN
  Administered 2011-12-18 (×2): 1 mg via INTRAVENOUS

## 2011-12-18 MED ORDER — ESMOLOL HCL 10 MG/ML IV SOLN
INTRAVENOUS | Status: DC | PRN
  Administered 2011-12-18 (×2): 30 mg via INTRAVENOUS

## 2011-12-18 MED ORDER — BUPIVACAINE HCL (PF) 0.5 % IJ SOLN
INTRAMUSCULAR | Status: DC | PRN
  Administered 2011-12-18: 18 mL

## 2011-12-18 MED ORDER — SUFENTANIL CITRATE 50 MCG/ML IV SOLN
INTRAVENOUS | Status: DC | PRN
  Administered 2011-12-18 (×2): 5 ug via INTRAVENOUS
  Administered 2011-12-18: 10 ug via INTRAVENOUS
  Administered 2011-12-18 (×2): 5 ug via INTRAVENOUS
  Administered 2011-12-18 (×2): 10 ug via INTRAVENOUS

## 2011-12-18 MED ORDER — LISINOPRIL 40 MG PO TABS
40.0000 mg | ORAL_TABLET | Freq: Every day | ORAL | Status: DC
  Administered 2011-12-19: 40 mg via ORAL
  Filled 2011-12-18 (×2): qty 1

## 2011-12-18 MED ORDER — ONDANSETRON HCL 4 MG PO TABS
4.0000 mg | ORAL_TABLET | Freq: Four times a day (QID) | ORAL | Status: DC | PRN
  Filled 2011-12-18: qty 1

## 2011-12-18 MED ORDER — SODIUM CHLORIDE 0.9 % IJ SOLN
3.0000 mL | Freq: Two times a day (BID) | INTRAMUSCULAR | Status: DC
  Administered 2011-12-19 – 2011-12-21 (×3): 3 mL via INTRAVENOUS
  Administered 2011-12-22: 10 mL via INTRAVENOUS
  Administered 2011-12-23 – 2011-12-24 (×2): 3 mL via INTRAVENOUS

## 2011-12-18 MED ORDER — LACTATED RINGERS IV SOLN
INTRAVENOUS | Status: DC | PRN
  Administered 2011-12-18: 18:00:00 via INTRAVENOUS

## 2011-12-18 SURGICAL SUPPLY — 70 items
BAG SPEC THK2 15X12 ZIP CLS (MISCELLANEOUS)
BAG ZIPLOCK 12X15 (MISCELLANEOUS) ×1 IMPLANT
BANDAGE ELASTIC 6 VELCRO ST LF (GAUZE/BANDAGES/DRESSINGS) ×4 IMPLANT
BANDAGE ESMARK 6X9 LF (GAUZE/BANDAGES/DRESSINGS) IMPLANT
BANDAGE GAUZE ELAST BULKY 4 IN (GAUZE/BANDAGES/DRESSINGS) ×4 IMPLANT
BIT DRILL 4.4 NS (BIT) ×1 IMPLANT
BNDG CMPR 9X6 STRL LF SNTH (GAUZE/BANDAGES/DRESSINGS) ×1
BNDG COHESIVE 4X5 TAN STRL (GAUZE/BANDAGES/DRESSINGS) ×4 IMPLANT
BNDG ESMARK 6X9 LF (GAUZE/BANDAGES/DRESSINGS) ×2
CLOTH BEACON ORANGE TIMEOUT ST (SAFETY) ×2 IMPLANT
CUFF TOURN SGL QUICK 34 (TOURNIQUET CUFF) ×4
CUFF TRNQT CYL 34X4X40X1 (TOURNIQUET CUFF) ×1 IMPLANT
DEVICE TARGETING RADIOL 471830 (MISCELLANEOUS) ×1 IMPLANT
DRAPE C-ARM 42X72 X-RAY (DRAPES) ×3 IMPLANT
DRAPE C-ARMOR (DRAPES) ×2 IMPLANT
DRAPE INCISE IOBAN 66X45 STRL (DRAPES) ×1 IMPLANT
DRAPE LG THREE QUARTER DISP (DRAPES) ×4 IMPLANT
DRAPE U-SHAPE 47X51 STRL (DRAPES) ×4 IMPLANT
DRILL ADP TAR DEV 3.6 471839 (MISCELLANEOUS) ×1 IMPLANT
DRSG EMULSION OIL 3X3 NADH (GAUZE/BANDAGES/DRESSINGS) ×1 IMPLANT
DRSG PAD ABDOMINAL 8X10 ST (GAUZE/BANDAGES/DRESSINGS) ×7 IMPLANT
DURAPREP 26ML APPLICATOR (WOUND CARE) ×3 IMPLANT
ELECT REM PT RETURN 9FT ADLT (ELECTROSURGICAL) ×2
ELECTRODE REM PT RTRN 9FT ADLT (ELECTROSURGICAL) ×1 IMPLANT
END CAP UNIVERSAL FLUSH (Trauma) ×1 IMPLANT
GAUZE XEROFORM 5X9 LF (GAUZE/BANDAGES/DRESSINGS) ×2 IMPLANT
GLOVE BIOGEL PI IND STRL 7.0 (GLOVE) IMPLANT
GLOVE BIOGEL PI IND STRL 8 (GLOVE) IMPLANT
GLOVE BIOGEL PI INDICATOR 7.0 (GLOVE) ×1
GLOVE BIOGEL PI INDICATOR 8 (GLOVE) ×3
GLOVE ECLIPSE 8.0 STRL XLNG CF (GLOVE) ×3 IMPLANT
GLOVE ORTHO TXT STRL SZ7.5 (GLOVE) ×1 IMPLANT
GLOVE SURG ORTHO 8.0 STRL STRW (GLOVE) ×3 IMPLANT
GLOVE SURG SS PI 6.5 STRL IVOR (GLOVE) ×1 IMPLANT
GLOVE SURG SS PI 8.5 STRL IVOR (GLOVE) ×2
GLOVE SURG SS PI 8.5 STRL STRW (GLOVE) IMPLANT
GOWN STRL REIN XL XLG (GOWN DISPOSABLE) ×3 IMPLANT
GUIDEWIRE BALL NOSE 80CM (WIRE) ×1 IMPLANT
NAIL TIBIAL 10MMX33CM (Nail) ×2 IMPLANT
NEEDLE HYPO 22GX1.5 SAFETY (NEEDLE) ×2 IMPLANT
NS IRRIG 1000ML POUR BTL (IV SOLUTION) ×2 IMPLANT
PACK LOWER EXTREMITY WL (CUSTOM PROCEDURE TRAY) ×1 IMPLANT
PACK TOTAL JOINT (CUSTOM PROCEDURE TRAY) ×1 IMPLANT
PAD CAST 4YDX4 CTTN HI CHSV (CAST SUPPLIES) ×2 IMPLANT
PADDING CAST COTTON 4X4 STRL (CAST SUPPLIES)
PIN GUIDE ACE (PIN) ×2 IMPLANT
POSITIONER SURGICAL ARM (MISCELLANEOUS) ×2 IMPLANT
SCREW ACECAP 34MM (Screw) ×1 IMPLANT
SCREW ACECAP 36MM (Screw) ×1 IMPLANT
SCREW ACECAP 42MM (Screw) ×1 IMPLANT
SCREW ACECAP 46MM (Screw) ×1 IMPLANT
SCREW PROXIMAL DEPUY (Screw) ×8 IMPLANT
SCREW PRXML FT 45X5.5XLCK NS (Screw) IMPLANT
SCREW PRXML FT 50X5.5XLCK NS (Screw) IMPLANT
SCREW PRXML FT 60X5.5XNS LF (Screw) IMPLANT
SCREW PRXML FT 65X5.5XNS CORT (Screw) IMPLANT
SPONGE GAUZE 4X4 12PLY (GAUZE/BANDAGES/DRESSINGS) ×3 IMPLANT
SPONGE LAP 4X18 X RAY DECT (DISPOSABLE) ×2 IMPLANT
STAPLER VISISTAT (STAPLE) ×3 IMPLANT
STOCKINETTE 8 INCH (MISCELLANEOUS) ×3 IMPLANT
SUT ETHILON 3 0 PS 1 (SUTURE) ×3 IMPLANT
SUT VIC AB 0 CT1 27 (SUTURE) ×4
SUT VIC AB 0 CT1 27XBRD ANTBC (SUTURE) ×1 IMPLANT
SUT VIC AB 2-0 CT1 27 (SUTURE) ×8
SUT VIC AB 2-0 CT1 27XBRD (SUTURE) ×1 IMPLANT
SUT VIC AB 2-0 CT1 TAPERPNT 27 (SUTURE) IMPLANT
SYR CONTROL 10ML LL (SYRINGE) ×1 IMPLANT
TOWEL OR 17X26 10 PK STRL BLUE (TOWEL DISPOSABLE) ×6 IMPLANT
TRAY FOLEY CATH 14FRSI W/METER (CATHETERS) ×1 IMPLANT
WATER STERILE IRR 1500ML POUR (IV SOLUTION) ×3 IMPLANT

## 2011-12-18 NOTE — Consult Note (Signed)
Reason for Consult bilateral tibia fibula fractures Referring Physician: Dr. Hildred Priest is an 46 y.o. male.  HPI: 46 year old Caucasian male sustained diabetes-related syncopal episode today. When he awoke he had bilateral leg pain and was unable to ambulate. He was brought to [facility] emergency department by EMS evaluated by Dr. Lynelle Doctor and found to have bilateral tibia fibula fractures. I was consulted secondary to be on unassigned call. Patient only complains of bilateral leg pain right worse than left no numbness or tingling no neck pain chest pain pelvic arm pain.  Past Medical History  Diagnosis Date  . Diabetes mellitus type 1   . Hyperlipidemia   . Hypothyroidism   . Depression   . Umbilical hernia   . Hypertension     Past Surgical History  Procedure Date  . Appendectomy     open    History reviewed. No pertinent family history.  Social History:  reports that he has been smoking Cigarettes.  He has been smoking about .5 packs per day. He has never used smokeless tobacco. He reports that he does not drink alcohol or use illicit drugs.  Allergies: No Known Allergies  Medications: I have reviewed the patient's current medications.  Results for orders placed during the hospital encounter of 12/18/11 (from the past 48 hour(s))  GLUCOSE, CAPILLARY     Status: Abnormal   Collection Time   12/18/11  1:40 PM      Component Value Range Comment   Glucose-Capillary 145 (*) 70 - 99 (mg/dL)   CBC     Status: Abnormal   Collection Time   12/18/11  2:10 PM      Component Value Range Comment   WBC 17.7 (*) 4.0 - 10.5 (K/uL)    RBC 4.86  4.22 - 5.81 (MIL/uL)    Hemoglobin 14.5  13.0 - 17.0 (g/dL)    HCT 45.4  09.8 - 11.9 (%)    MCV 89.1  78.0 - 100.0 (fL)    MCH 29.8  26.0 - 34.0 (pg)    MCHC 33.5  30.0 - 36.0 (g/dL)    RDW 14.7  82.9 - 56.2 (%)    Platelets 350  150 - 400 (K/uL)   DIFFERENTIAL     Status: Abnormal   Collection Time   12/18/11  2:10 PM   Component Value Range Comment   Neutrophils Relative 91 (*) 43 - 77 (%)    Neutro Abs 16.1 (*) 1.7 - 7.7 (K/uL)    Lymphocytes Relative 6 (*) 12 - 46 (%)    Lymphs Abs 1.0  0.7 - 4.0 (K/uL)    Monocytes Relative 4  3 - 12 (%)    Monocytes Absolute 0.6  0.1 - 1.0 (K/uL)    Eosinophils Relative 0  0 - 5 (%)    Eosinophils Absolute 0.0  0.0 - 0.7 (K/uL)    Basophils Relative 0  0 - 1 (%)    Basophils Absolute 0.0  0.0 - 0.1 (K/uL)   BASIC METABOLIC PANEL     Status: Abnormal   Collection Time   12/18/11  2:10 PM      Component Value Range Comment   Sodium 133 (*) 135 - 145 (mEq/L)    Potassium 4.7  3.5 - 5.1 (mEq/L)    Chloride 101  96 - 112 (mEq/L)    CO2 20  19 - 32 (mEq/L)    Glucose, Bld 238 (*) 70 - 99 (mg/dL)    BUN 20  6 - 23 (mg/dL)    Creatinine, Ser 9.14  0.50 - 1.35 (mg/dL)    Calcium 9.3  8.4 - 10.5 (mg/dL)    GFR calc non Af Amer >90  >90 (mL/min)    GFR calc Af Amer >90  >90 (mL/min)   SAMPLE TO BLOOD BANK     Status: Normal   Collection Time   12/18/11  2:10 PM      Component Value Range Comment   Blood Bank Specimen SAMPLE AVAILABLE FOR TESTING      Sample Expiration 12/21/2011     GLUCOSE, CAPILLARY     Status: Abnormal   Collection Time   12/18/11  3:00 PM      Component Value Range Comment   Glucose-Capillary 353 (*) 70 - 99 (mg/dL)    Comment 1 Documented in Chart      Comment 2 Notify RN       Dg Tibia/fibula Left  12/18/2011  *RADIOLOGY REPORT*  Clinical Data: 46 year old male with possible fall.  Lower extremity deformity.  Hypoglycemia.  LEFT TIBIA AND FIBULA - 2 VIEW  Comparison: None.  Findings: Spiral fracture of the distal third left tibia shaft. Distal fragment posteriorly displaced by one half shaft with, laterally displaced by one half shaft with, and anteriorly angulated.  Spiral fracture of the left fibula proximal third shaft.  Lateral displacement of nearly one full shaft with.  Anterior displacement of less than one full shaft width and posterior  angulation.  Grossly normal alignment at the left knee and ankle.  IMPRESSION: 1.  Distal third left tibia shaft spiral fracture with displacement and angulation as above. 2.  Proximal third left fibula spiral shaft fracture.  Original Report Authenticated By: Harley Hallmark, M.D.   Dg Tibia/fibula Right  12/18/2011  *RADIOLOGY REPORT*  Clinical Data: Hypoglycemia.  Fall.  Pain and deformity.  RIGHT TIBIA AND FIBULA - 2 VIEW  Comparison: None.  Findings: There is a spiral fracture of the distal tibial diaphysis with lateral displacement of the distal fragment by 8 mm.  There is an oblique fracture of the proximal diaphysis of the fibula. Distal fragment is displaced laterally 5 mm.  IMPRESSION: Oblique fracture of the proximal fibular diaphysis with lateral displacement of the distal fragment 5 mm.  Spiral fracture of the distal tibial diaphysis with lateral displacement of the distal fragment 7 mm.  Original Report Authenticated By: Thomasenia Sales, M.D.    ROS Blood pressure 139/72, pulse 103, temperature 97.4 F (36.3 C), temperature source Oral, resp. rate 22, SpO2 99.00%. Physical Exam Patient is awake alert oriented to person place time and circumstance. Complaining of moderate to severe pain.  Left lower extremity anteromedial abrasion without bleeding compartments are soft pulses 4+ distal no pain with passive motion of toes active range of motion of toes nonpainful knee without effusion. Right lower extremity skin is intact compartments are soft 4+ pulses distally can actively and passively move toes without pain. Knee without effusion. Assessment/Plan: Bilateral tibia and fibula diaphyseal fractures unstable. Left probable closed possibly occult open. I discussed with the patient and his mother at the present Mr. Oneida Alar PA-C and nurse the fracture patterns and the need for internal fixation. We also discussed possibility of delayed union malunion nonunion infection DVT thromboembolism knee  or ankle pain.  They wish to proceed and accept the risk and benefits.  Josue Kass ANDREW 12/18/2011, 5:52 PM

## 2011-12-18 NOTE — ED Notes (Signed)
Pt transported to OR at this time  

## 2011-12-18 NOTE — ED Notes (Signed)
EDP notified of run v-tach that l;asted a minute. EKG printed.

## 2011-12-18 NOTE — Transfer of Care (Signed)
Immediate Anesthesia Transfer of Care Note  Patient: Reginald Gutierrez  Procedure(s) Performed: Procedure(s) (LRB): INTRAMEDULLARY (IM) NAIL TIBIAL (Bilateral)  Patient Location: PACU  Anesthesia Type: General  Level of Consciousness: awake, alert , oriented, patient cooperative and responds to stimulation  Airway & Oxygen Therapy: Patient Spontanous Breathing and Patient connected to face mask oxygen  Post-op Assessment: Report given to PACU RN, Post -op Vital signs reviewed and stable and Patient moving all extremities X 4  Post vital signs: stable  Complications: No apparent anesthesia complications

## 2011-12-18 NOTE — Anesthesia Preprocedure Evaluation (Addendum)
Anesthesia Evaluation  Patient identified by MRN, date of birth, ID band Patient awake    Reviewed: Allergy & Precautions, H&P , NPO status , Patient's Chart, lab work & pertinent test results  Airway Mallampati: II TM Distance: >3 FB Neck ROM: Full    Dental No notable dental hx.    Pulmonary Current Smoker,  breath sounds clear to auscultation  Pulmonary exam normal       Cardiovascular hypertension, Pt. on medications Rhythm:Regular Rate:Normal     Neuro/Psych PSYCHIATRIC DISORDERS Depression Syncope today thought secondary to hypoglycemia, less likely secondary to cardiac cause. negative neurological ROS     GI/Hepatic negative GI ROS, Neg liver ROS,   Endo/Other  Diabetes mellitus-, Type 1, Insulin DependentHypothyroidism   Renal/GU negative Renal ROS  negative genitourinary   Musculoskeletal negative musculoskeletal ROS (+)   Abdominal   Peds negative pediatric ROS (+)  Hematology negative hematology ROS (+)   Anesthesia Other Findings   Reproductive/Obstetrics negative OB ROS                          Anesthesia Physical Anesthesia Plan  ASA: III  Anesthesia Plan: General   Post-op Pain Management:    Induction: Intravenous  Airway Management Planned: Oral ETT  Additional Equipment:   Intra-op Plan:   Post-operative Plan: Extubation in OR  Informed Consent: I have reviewed the patients History and Physical, chart, labs and discussed the procedure including the risks, benefits and alternatives for the proposed anesthesia with the patient or authorized representative who has indicated his/her understanding and acceptance.   Dental advisory given  Plan Discussed with: CRNA  Anesthesia Plan Comments:         Anesthesia Quick Evaluation

## 2011-12-18 NOTE — Anesthesia Postprocedure Evaluation (Signed)
  Anesthesia Post-op Note  Patient: Reginald Gutierrez  Procedure(s) Performed: Procedure(s) (LRB): INTRAMEDULLARY (IM) NAIL TIBIAL (Bilateral)  Patient Location: PACU  Anesthesia Type: General  Level of Consciousness: awake and alert   Airway and Oxygen Therapy: Patient Spontanous Breathing  Post-op Pain: mild  Post-op Assessment: Post-op Vital signs reviewed, Patient's Cardiovascular Status Stable, Respiratory Function Stable, Patent Airway and No signs of Nausea or vomiting  Post-op Vital Signs: stable  Complications: No apparent anesthesia complications

## 2011-12-18 NOTE — ED Notes (Signed)
[id number]

## 2011-12-18 NOTE — Preoperative (Signed)
Beta Blockers   Reason not to administer Beta Blockers:Not Applicable 

## 2011-12-18 NOTE — Progress Notes (Signed)
Pharmacy brief Lovenox note  Wt = 82 kg SCr = 0.69   CrCl~137 ml/min (N)  Adjusted Lovenox to 40 mg daily.  Gean Birchwood, PharmD [phone]

## 2011-12-18 NOTE — Progress Notes (Signed)
Patient was admitted to Emergency earlier today with two broken legs.  His mother related in conversation with the Chaplain that he had passed out from serious low blood sugar, breaking both bones in both calves.  Chaplain was initially called (at 5:35pm) to come before surgery to pray with the patient, but the timeline, traffic pattern and severe weather all would have had the Chaplain arrive after entry into surgery (for which the timeline alone made it tight).  The Chaplain asked the nurse to explain the situation to the family and that he would come later to be with them perhaps after surgery or sometime this evening.  At 9pm, the Chaplain checked the waiting room (as he had done about 6:35pm) for any family of the patient.  The patient's mother was sitting in there.  The Chaplain explained what had occurred in more detail.  He chatted with the mother and let her tell her story.  The son is diabetic and a reformed drug addict, but doesn't take his diabetes seriously and doesn't care for himself.  We talked about motherhood.  She is also a former Psychologist, forensic.  Her daughter is a Engineer, civil (consulting) at [employer].  The Chaplain sat with the mother for about an hour when her friend Corrie Dandy) of 45 years came to sit with her.  He stayed another few minutes, offered prayer for the situation, and left.  The prayer opened some tears for the mother.  Patient will likely be here a few days or so.  Emergency informed the Chaplain on the original page call that he was scheduled for Rm. 1408.  Rema Jasmine, Chaplain Group Pager: [phone] Local Pager:  [phone]

## 2011-12-18 NOTE — ED Notes (Signed)
Pt is screaming curse words.

## 2011-12-18 NOTE — ED Notes (Signed)
[id number]<JY> Expected date:12/18/11<BR> Expected time:<BR> Means of arrival:<BR> Comments:<BR> EMS 41 GC - bilateral leg deformity/syncope

## 2011-12-18 NOTE — ED Provider Notes (Addendum)
History     CSN: [id number]  Arrival date & time 12/18/11  1335   First MD Initiated Contact with Patient 12/18/11 1344      Chief Complaint  Patient presents with  . Hypoglycemia  . Leg Pain     HPI Patient presents to the emergency room after having a hypoglycemic event and fall. Patient has known diabetes and took his insulin this morning. He was working out in the garage when he started to feel lightheaded and weak like his sugar was dropping.. Patient was attempting to get something to eat when he had a syncopal event. The next thing the patient remembers is EMS arriving. He woke up on the ground with severe bilateral lower extremity pain and deformity.  Patient states he was not on a ladder and was just walking. He refused an IV on route and was given 4 tubes of glucose with resolution of his hypoglycemia. The pain isn't severe it increases with movement and palpation. He denies any chest pain, vomiting or diarrhea. He has no focal numbness or weakness now. Past Medical History  Diagnosis Date  . Diabetes mellitus type 1   . Hyperlipidemia   . Hypothyroidism   . Depression   . Umbilical hernia   . Hypertension     Past Surgical History  Procedure Date  . Appendectomy     open    No family history on file.  History  Substance Use Topics  . Smoking status: Current Everyday Smoker -- 0.5 packs/day    Types: Cigarettes  . Smokeless tobacco: Not on file  . Alcohol Use: No      Review of Systems  All other systems reviewed and are negative.    Allergies  Review of patient's allergies indicates no known allergies.  Home Medications   Current Outpatient Rx  Name Route Sig Dispense Refill  . ARIPIPRAZOLE 5 MG PO TABS Oral Take 5 mg by mouth daily.      . ATORVASTATIN CALCIUM 40 MG PO TABS Oral Take 40 mg by mouth daily.     Marland Kitchen CEFUROXIME AXETIL 500 MG PO TABS Oral Take 500 mg by mouth 2 (two) times daily.      Marland Kitchen CICLESONIDE 50 MCG/ACT NA SUSP Each Nare Place 2  sprays into both nostrils daily.     Marland Kitchen CLARITHROMYCIN 500 MG PO TABS Oral Take 500 mg by mouth 2 (two) times daily.      . ABDOMINAL BINDER/ELASTIC LARGE MISC Does not apply 1 Device by Does not apply route daily. 2 each 2    Pt make choose another size  . INSULIN GLARGINE 100 UNIT/ML [place] SOLN Subcutaneous Inject 20 Units into the skin at bedtime.     . INSULIN LISPRO (HUMAN) 100 UNIT/ML [place] SOLN Subcutaneous Inject 3-7 Units into the skin 3 (three) times daily before meals.     Marland Kitchen LEVOTHYROXINE SODIUM 200 MCG PO TABS Oral Take 200 mcg by mouth daily.      Marland Kitchen LEVOTHYROXINE SODIUM 50 MCG PO TABS Oral Take 50 mcg by mouth daily.      Marland Kitchen LEVOTHYROXINE SODIUM 50 MCG PO TABS Oral Take 4 tablets (200 mcg total) by mouth daily. 30 tablet 1  . LISINOPRIL 40 MG PO TABS Oral Take 40 mg by mouth daily. For blood pressure    . PROMETHAZINE-DM 6.25-15 MG/5ML PO SYRP Oral Take by mouth 4 (four) times daily as needed.      . SERTRALINE HCL 100 MG PO TABS Oral  Take 200 mg by mouth daily. For mood      BP 139/72  Pulse 103  Temp(Src) 97.4 F (36.3 C) (Oral)  Resp 22  SpO2 99%  Physical Exam  Nursing note and vitals reviewed. Constitutional: He appears well-developed and well-nourished. No distress.  HENT:  Head: Normocephalic and atraumatic.  Right Ear: External ear normal.  Left Ear: External ear normal.  Eyes: Conjunctivae are normal. Right eye exhibits no discharge. Left eye exhibits no discharge. No scleral icterus.  Neck: Neck supple. No tracheal deviation present.  Cardiovascular: Normal rate, regular rhythm and intact distal pulses.   Pulmonary/Chest: Effort normal and breath sounds normal. No stridor. No respiratory distress. He has no wheezes. He has no rales.  Abdominal: Soft. Bowel sounds are normal. He exhibits no distension. There is no tenderness. There is no rebound and no guarding.  Musculoskeletal: He exhibits edema and tenderness.       Right lower leg: He exhibits tenderness and bony  tenderness. He exhibits no deformity and no laceration.       Left lower leg: He exhibits tenderness and bony tenderness. He exhibits no deformity and no laceration.       Bilateral lower extremities are placed in splints  Neurological: He is alert. He has normal strength. No sensory deficit. Cranial nerve deficit:  no gross defecits noted. He exhibits normal muscle tone. He displays no seizure activity. Coordination normal.  Skin: Skin is warm and dry. No rash noted.  Psychiatric: He has a normal mood and affect.    ED Course  Procedures (including critical care time)  Rate: 100  Rhythm: Sinus tachycardia  QRS Axis: Borderline right axis deviation  Intervals: normal  ST/T Wave abnormalities: normal  Conduction Disutrbances:none  Narrative Interpretation:   Old EKG Reviewed: none available  Labs Reviewed  GLUCOSE, CAPILLARY - Abnormal; Notable for the following:    Glucose-Capillary 145 (*)    All other components within normal limits  CBC - Abnormal; Notable for the following:    WBC 17.7 (*)    All other components within normal limits  DIFFERENTIAL - Abnormal; Notable for the following:    Neutrophils Relative 91 (*)    Neutro Abs 16.1 (*)    Lymphocytes Relative 6 (*)    All other components within normal limits  BASIC METABOLIC PANEL - Abnormal; Notable for the following:    Sodium 133 (*)    Glucose, Bld 238 (*)    All other components within normal limits  GLUCOSE, CAPILLARY - Abnormal; Notable for the following:    Glucose-Capillary 353 (*)    All other components within normal limits  SAMPLE TO BLOOD BANK   Dg Tibia/fibula Left  12/18/2011  *RADIOLOGY REPORT*  Clinical Data: 46 year old male with possible fall.  Lower extremity deformity.  Hypoglycemia.  LEFT TIBIA AND FIBULA - 2 VIEW  Comparison: None.  Findings: Spiral fracture of the distal third left tibia shaft. Distal fragment posteriorly displaced by one half shaft with, laterally displaced by one half shaft  with, and anteriorly angulated.  Spiral fracture of the left fibula proximal third shaft.  Lateral displacement of nearly one full shaft with.  Anterior displacement of less than one full shaft width and posterior angulation.  Grossly normal alignment at the left knee and ankle.  IMPRESSION: 1.  Distal third left tibia shaft spiral fracture with displacement and angulation as above. 2.  Proximal third left fibula spiral shaft fracture.  Original Report Authenticated By: Ulla Potash III,  M.D.   Dg Tibia/fibula Right  12/18/2011  *RADIOLOGY REPORT*  Clinical Data: Hypoglycemia.  Fall.  Pain and deformity.  RIGHT TIBIA AND FIBULA - 2 VIEW  Comparison: None.  Findings: There is a spiral fracture of the distal tibial diaphysis with lateral displacement of the distal fragment by 8 mm.  There is an oblique fracture of the proximal diaphysis of the fibula. Distal fragment is displaced laterally 5 mm.  IMPRESSION: Oblique fracture of the proximal fibular diaphysis with lateral displacement of the distal fragment 5 mm.  Spiral fracture of the distal tibial diaphysis with lateral displacement of the distal fragment 7 mm.  Original Report Authenticated By: Thomasenia Sales, M.D.     1. Hypoglycemia   2. Bilateral tibial fractures   3. Closed bilateral fibular fractures       MDM  The patient appears to have bilateral spiral tibia fibula fractures.  These are both closed injuries. Surprisingly he sustained these injuries from a fall from standing height. However, the patient denies any other mechanism.  His hypoglycemia has resolved.  In fact, he is hyperglycemic now however he will need to remain n.p.o. We will continue to monitor. I will consult the orthopedist on call for possible operative repair of these injuries.   Case discussed with Dr Thomasena Edis.  With his syncopal event and DM he requests medical admission.    Celene Kras, MD 12/18/11 [phone]

## 2011-12-18 NOTE — Anesthesia Procedure Notes (Addendum)
Performed by: Anastasio Champion E   Procedure Name: Intubation Date/Time: 12/18/2011 7:25 PM Performed by: Illene Silver Pre-anesthesia Checklist: Patient identified, Emergency Drugs available, Suction available and Patient being monitored Patient Re-evaluated:Patient Re-evaluated prior to inductionOxygen Delivery Method: Circle system utilized Preoxygenation: Pre-oxygenation with 100% oxygen Intubation Type: IV induction Ventilation: Oral airway inserted - appropriate to patient size Laryngoscope Size: Miller and 3 Grade View: Grade III Tube type: Oral Tube size: 8.0 mm Number of attempts: 2 Airway Equipment and Method: Stylet Placement Confirmation: ETT inserted through vocal cords under direct vision,  positive ETCO2 and breath sounds checked- equal and bilateral Secured at: 21 cm Tube secured with: Tape (Unable  to lift epiglottis with Mac4, Changed to  [employer] 3 with increased cricoid and bougie used) Dental Injury: Teeth and Oropharynx as per pre-operative assessment  Difficulty Due To: Difficulty was anticipated, Difficult Airway- due to reduced neck mobility and Difficult Airway- due to anterior larynx Future Recommendations: Recommend- induction with short-acting agent, and alternative techniques readily available

## 2011-12-18 NOTE — Op Note (Signed)
Preop diagnosis 1 right comminuted unstable tibia and fibula diaphyseal fractures. #2 left comminuted unstable tibia and fibula diaphyseal fractures. Postop diagnosis 1 same 2 same Procedure #1 right closed intramedullary nailing of tibia static interlocking #2 left closed intramedullary nailing of tibia static interlocking 3 C-arm radiography Surgeon Valma Cava M.D. Asst. Oneida Alar PA-C Anesthesia Gen. Estimated blood loss 300 cc Drains none Complications none Tourniquet time right 21 minutes left 16 minutes Disposition PACU stable  Operative details Patient was counseled in the holding area along his mother confirmed the above-noted procedure and proceeded operating room with IV Ancef 1 g given. In the operating room he was placed surgical anesthesia. Foley catheter was placed utilizing sterile technique by the or circulating nurse. Both lower extremities were shaved appropriately then prepped with DuraPrep and draped into a sterile fashion. All extremities were well tended to have probably padded throughout the procedure.  Attention was first directed to the right lower extremity was elevated exsanguinated with an Esmarch and tourniquet was inflated 250 mm of mercury. Knee was flexed to 90 straight midline incision was made utilizes C-arm a guidepin in the center of the tibia is located patella tendon was incised longitudinally into a articular but extrasynovial approach was performed with guidepin placed directly into this and the tibia tacked in position and then step reaming was utilized. Utilize a curb all the center of the tibia is located ball-tip guidewire was passed across the fracture site after reduced it clinically. The screws was confirmed with C-arm found excellent position. Tourniquet was then deflated and sequential reaming was performed up to 11.5 mm. We had also measured the length of the nail has been 33 cm this time we chose a 10 mm x 33 cm Biomet tibial titanium nail it was  gently placed over the guidewire and tapped into position held the fracture is clinically reduced. 2 crossing proximal interlocking screws were placed with C-arm guidance documented been removed knee was placed into extension the confirmed AP lateral plane of the knee excellent position of the nail and cross pins. Fracture reduced excellent radiographically and clinically. Distally utilizing perfect circle technique all incision were made medial and 8 to transverse locking screws were placed bicortical with C-arm guidance. We then checked the fracture AP and lateral planes and had excellent reduction of the fracture and placement of all implants. Also felt at this time he was stable. Wounds were copiously irrigated and also placed a locking cap proximally and the tibial nail. The patella tendon was closed running Vicryl suture subcutaneous 2-0 undyed Vicryl suture skin with staples as was a small transverse incisions distally. Sterile compressive was placed over the knee after placing an 18 cc of 0.25% percent Sensorcaine into the skin edges. Foot was checked with X. about suture compartments were soft.  Attention was now directed to the left lower extremity elevated exsanguinated Esmarch tourniquet inflated 250 mm of mercury. Straight midline incision was made starting hole was localized patella tendon was split. Ball tip guidewire was placed across the fracture site after reduced it clinically and radiographically. Again there was measured FNB 33 cm in length. After confirming reduction of fracture and placement gapping type was deflated sequential reaming was then performed up to 11.5 mm it we chose a 10 mm x 33 cm Biomet interlocking titanium tibial nail. Tapped into position identical locking proximal screws my proximally he placement of the extension prepped and circle technique utilized distally small incision were made and 2 transverse locking screws were  placed. Utilize a C-arm we checked the fracture  reduction it was excellent clinically excellent placement implants excellent infractures were stable. Was approached irrigated. A locking cap was impacted into the proximal nail at this time as the appropriate size was available. Toe 2) Vicryl suture subcutaneous 2-0 undyed Vicryl suture skin staples. 18 cc 0.25% Sensorcaine posterior skin edges sterile compressive wraps are applied LeBeau) again circulation is intact compartments are soft excellent clinical alignment and also radiographic proved excellent alignment reduction. She was awakened taken to the operating room Jordan condition. We stabilized the PACU and transferred to his room.  To help with patient prepping positioning candidate for surgical assistance will closure Mr. Oneida Alar PA-C assistance was needed throughout this difficult case.

## 2011-12-18 NOTE — H&P (Addendum)
PCP:  MARTIN,NYKEDTRA, NP, NP   DOA:  12/18/2011  1:39 PM  Chief Complaint:  syncope  HPI: Patient is a pleasant 46 year old male with histroy significant for depression, DM, HTN and dyslipidemia who was brought to ED after having a syncopal episode today. Patient reports feeling lightheaded prior to fainting. He was working in a garden and attempted to have a snack but has fainted at that time. He reports no complaints of chest pain, no shortness of breath, no palpitations. No abdominal pain, no nausea or vomiting.  Assessment/Plan  Principal Problem:   *Tibial fracture bilateral - we will follow up with surgery whether there is a plan for intervention; per ED MD surgery was consulted  Active Problems:   Syncope and collapse - likely secondary to hypoglycemia, less likely secondary to cardiac arrhythmia - will cycle cardiac enzymes, obtain TSH and EKG - check UDS and alcohol level - PT evaluation   HYPOTHYROIDISM - check TSH - continue levothyroxine   DIABETES MELLITUS, TYPE I - continue sliding scale insulin - CBG monitoring   HYPERCHOLESTEROLEMIA - continue atorvastatin   DEPRESSION - continue wellbutrin  DVT Prophylaxis - lovenox sub Q  Code Status - full code  Education  - test results and diagnostic studies were discussed with patient  - patient  verbalized the understanding - questions were answered at the bedside and contact information was provided for additional questions or concerns    Allergies: No Known Allergies  Prior to Admission medications   Medication Sig Start Date End Date Taking? Authorizing Provider  atorvastatin (LIPITOR) 40 MG tablet Take 40 mg by mouth daily.    Yes Historical Provider, MD  buPROPion (WELLBUTRIN SR) 150 MG 12 hr tablet Take 150 mg by mouth daily.   Yes Historical Provider, MD  insulin glargine (LANTUS) 100 UNIT/ML injection Inject 25 Units into the skin at bedtime.    Yes Historical Provider, MD  insulin lispro  (HUMALOG) 100 UNIT/ML injection Inject 5-10 Units into the skin 3 (three) times daily before meals.    Yes Historical Provider, MD  levothyroxine (SYNTHROID, LEVOTHROID) 200 MCG tablet Take 200 mcg by mouth daily.     Yes Historical Provider, MD  lisinopril (PRINIVIL,ZESTRIL) 40 MG tablet Take 40 mg by mouth daily. For blood pressure   Yes Historical Provider, MD    Past Medical History  Diagnosis Date  . Diabetes mellitus type 1   . Hyperlipidemia   . Hypothyroidism   . Depression   . Umbilical hernia   . Hypertension     Past Surgical History  Procedure Date  . Appendectomy     open    Social History:  reports that he has been smoking Cigarettes.  He has been smoking about .5 packs per day. He has never used smokeless tobacco. He reports that he does not drink alcohol or use illicit drugs.  History reviewed. No pertinent family history.  Review of Systems:  Constitutional: Denies fever, chills, diaphoresis, appetite change and fatigue.  HEENT: Denies photophobia, eye pain, redness, hearing loss, ear pain, congestion, sore throat, rhinorrhea, sneezing, mouth sores, trouble swallowing, neck pain, neck stiffness and tinnitus.   Respiratory: Denies SOB, DOE, cough, chest tightness,  and wheezing.   Cardiovascular: Denies chest pain, palpitations and leg swelling.  Gastrointestinal: Denies nausea, vomiting, abdominal pain, diarrhea, constipation, blood in stool and abdominal distention.  Genitourinary: Denies dysuria, urgency, frequency, hematuria, flank pain and difficulty urinating.  Musculoskeletal: Denies myalgias, back pain, joint swelling, arthralgias and  gait problem.  Skin: Denies pallor, rash and wound.  Neurological: Denies dizziness, seizures, syncope, weakness, light-headedness, numbness and headaches.  Hematological: Denies adenopathy. Easy bruising, personal or family bleeding history  Psychiatric/Behavioral: Denies suicidal ideation, mood changes, confusion,  nervousness, sleep disturbance and agitation   Physical Exam:  Filed Vitals:   12/18/11 1335  BP: 139/72  Pulse: 103  Temp: 97.4 F (36.3 C)  TempSrc: Oral  Resp: 22  SpO2: 99%    Constitutional: Vital signs reviewed.  Patient is in no acute distress and cooperative with exam. Alert and oriented x3.  Head: Normocephalic and atraumatic Ear: TM normal bilaterally Mouth: no erythema or exudates, MMM Eyes: PERRL, EOMI, conjunctivae normal, No scleral icterus.  Neck: Supple, Trachea midline normal ROM, No JVD, mass, thyromegaly, or carotid bruit present.  Cardiovascular: RRR, S1 normal, S2 normal, no MRG, pulses symmetric and intact bilaterally Pulmonary/Chest: CTAB, no wheezes, rales, or rhonchi Abdominal: Soft. Non-tender, non-distended, bowel sounds are normal, no masses, organomegaly, or guarding present.  GU: no CVA tenderness Musculoskeletal: No joint deformities, erythema, or stiffness, ROM full and no nontender Ext: no edema and no cyanosis, pulses palpable bilaterally (DP and PT) Hematology: no cervical, inginal, or axillary adenopathy.  Neurological: A&O x3, Strenght is normal and symmetric bilaterally, cranial nerve II-XII are grossly intact, no focal motor deficit, sensory intact to light touch bilaterally.  Skin: Warm, dry and intact. No rash, cyanosis, or clubbing.  Psychiatric: Normal mood and affect. speech and behavior is normal. Judgment and thought content normal. Cognition and memory are normal.   Labs on Admission:  Results for orders placed during the hospital encounter of 12/18/11 (from the past 48 hour(s))  GLUCOSE, CAPILLARY     Status: Abnormal   Collection Time   12/18/11  1:40 PM      Component Value Range Comment   Glucose-Capillary 145 (*) 70 - 99 (mg/dL)   CBC     Status: Abnormal   Collection Time   12/18/11  2:10 PM      Component Value Range Comment   WBC 17.7 (*) 4.0 - 10.5 (K/uL)    RBC 4.86  4.22 - 5.81 (MIL/uL)    Hemoglobin 14.5  13.0 -  17.0 (g/dL)    HCT 16.1  09.6 - 04.5 (%)    MCV 89.1  78.0 - 100.0 (fL)    MCH 29.8  26.0 - 34.0 (pg)    MCHC 33.5  30.0 - 36.0 (g/dL)    RDW 40.9  81.1 - 91.4 (%)    Platelets 350  150 - 400 (K/uL)   DIFFERENTIAL     Status: Abnormal   Collection Time   12/18/11  2:10 PM      Component Value Range Comment   Neutrophils Relative 91 (*) 43 - 77 (%)    Neutro Abs 16.1 (*) 1.7 - 7.7 (K/uL)    Lymphocytes Relative 6 (*) 12 - 46 (%)    Lymphs Abs 1.0  0.7 - 4.0 (K/uL)    Monocytes Relative 4  3 - 12 (%)    Monocytes Absolute 0.6  0.1 - 1.0 (K/uL)    Eosinophils Relative 0  0 - 5 (%)    Eosinophils Absolute 0.0  0.0 - 0.7 (K/uL)    Basophils Relative 0  0 - 1 (%)    Basophils Absolute 0.0  0.0 - 0.1 (K/uL)   BASIC METABOLIC PANEL     Status: Abnormal   Collection Time   12/18/11  2:10 PM  Component Value Range Comment   Sodium 133 (*) 135 - 145 (mEq/L)    Potassium 4.7  3.5 - 5.1 (mEq/L)    Chloride 101  96 - 112 (mEq/L)    CO2 20  19 - 32 (mEq/L)    Glucose, Bld 238 (*) 70 - 99 (mg/dL)    BUN 20  6 - 23 (mg/dL)    Creatinine, Ser 7.82  0.50 - 1.35 (mg/dL)    Calcium 9.3  8.4 - 10.5 (mg/dL)    GFR calc non Af Amer >90  >90 (mL/min)    GFR calc Af Amer >90  >90 (mL/min)   SAMPLE TO BLOOD BANK     Status: Normal   Collection Time   12/18/11  2:10 PM      Component Value Range Comment   Blood Bank Specimen SAMPLE AVAILABLE FOR TESTING      Sample Expiration 12/21/2011     GLUCOSE, CAPILLARY     Status: Abnormal   Collection Time   12/18/11  3:00 PM      Component Value Range Comment   Glucose-Capillary 353 (*) 70 - 99 (mg/dL)    Comment 1 Documented in Chart      Comment 2 Notify RN       Radiological Exams on Admission: X ray tibial/fibula left: 1. Distal third left tibia shaft spiral fracture with displacement and angulation as above. 2. Proximal third left fibula spiral shaft fracture. X ray tibia/fibula right: 1. Oblique fracture of the proximal fibular diaphysis with  lateral displacement of the distal fragment 5 mm. Spiral fracture of the distal tibial diaphysis with lateral displacement of the distal fragment 7 mm.   Time Spent on Admission: Over 30 minutes  Cinzia Devos 12/18/2011, 4:54 PM  [facility] Pager # [phone] Main Office # [phone]

## 2011-12-18 NOTE — ED Notes (Signed)
Per EMS- Patient  Called EMS when he realized that his blood sugar was low. CBG-32 upon arrival was 32. Glucose 4 tubes given by EMS. Patient refused IV and pain medicine when offered.. CBG prior to ED arrival-45.

## 2011-12-19 ENCOUNTER — Inpatient Hospital Stay (HOSPITAL_COMMUNITY): Payer: Medicare Other

## 2011-12-19 DIAGNOSIS — D638 Anemia in other chronic diseases classified elsewhere: Secondary | ICD-10-CM

## 2011-12-19 DIAGNOSIS — R0602 Shortness of breath: Secondary | ICD-10-CM

## 2011-12-19 DIAGNOSIS — S82209A Unspecified fracture of shaft of unspecified tibia, initial encounter for closed fracture: Secondary | ICD-10-CM

## 2011-12-19 DIAGNOSIS — I214 Non-ST elevation (NSTEMI) myocardial infarction: Secondary | ICD-10-CM

## 2011-12-19 DIAGNOSIS — I959 Hypotension, unspecified: Secondary | ICD-10-CM | POA: Diagnosis not present

## 2011-12-19 DIAGNOSIS — D62 Acute posthemorrhagic anemia: Secondary | ICD-10-CM | POA: Diagnosis not present

## 2011-12-19 DIAGNOSIS — E875 Hyperkalemia: Secondary | ICD-10-CM | POA: Diagnosis not present

## 2011-12-19 DIAGNOSIS — I059 Rheumatic mitral valve disease, unspecified: Secondary | ICD-10-CM

## 2011-12-19 DIAGNOSIS — J690 Pneumonitis due to inhalation of food and vomit: Secondary | ICD-10-CM

## 2011-12-19 DIAGNOSIS — R7989 Other specified abnormal findings of blood chemistry: Secondary | ICD-10-CM

## 2011-12-19 LAB — GLUCOSE, CAPILLARY

## 2011-12-19 LAB — PHOSPHORUS: Phosphorus: 4.1 mg/dL (ref 2.3–4.6)

## 2011-12-19 LAB — BLOOD GAS, ARTERIAL
Bicarbonate: 17.4 mEq/L — ABNORMAL LOW (ref 20.0–24.0)
Bicarbonate: 15.7 mEq/L — ABNORMAL LOW (ref 20.0–24.0)
Bicarbonate: 19.1 mEq/L — ABNORMAL LOW (ref 20.0–24.0)
FIO2: 1 %
MECHVT: 500 mL
O2 Saturation: 99.3 %
O2 Saturation: 99.6 %
O2 Saturation: 99.7 %
PEEP: 5 cmH2O
PEEP: 5 cmH2O
Patient temperature: 37
TCO2: 15 mmol/L (ref 0–100)
TCO2: 17.8 mmol/L (ref 0–100)
pO2, Arterial: 163 mmHg — ABNORMAL HIGH (ref 80.0–100.0)
pO2, Arterial: 163 mmHg — ABNORMAL HIGH (ref 80.0–100.0)

## 2011-12-19 LAB — HEMOGLOBIN A1C
Hgb A1c MFr Bld: 5.9 % — ABNORMAL HIGH (ref <5.7)
Mean Plasma Glucose: 123 mg/dL — ABNORMAL HIGH (ref <117)

## 2011-12-19 LAB — BASIC METABOLIC PANEL
CO2: 19 mEq/L (ref 19–32)
CO2: 21 mEq/L (ref 19–32)
Calcium: 8.1 mg/dL — ABNORMAL LOW (ref 8.4–10.5)
Chloride: 102 mEq/L (ref 96–112)
Chloride: 107 mEq/L (ref 96–112)
Creatinine, Ser: 1.32 mg/dL (ref 0.50–1.35)
Glucose, Bld: 244 mg/dL — ABNORMAL HIGH (ref 70–99)
Potassium: 6.3 mEq/L (ref 3.5–5.1)
Potassium: 3.5 mEq/L (ref 3.5–5.1)
Sodium: 137 mEq/L (ref 135–145)

## 2011-12-19 LAB — PROTIME-INR
INR: 1.13 (ref 0.00–1.49)
INR: 1.4 (ref 0.00–1.49)
Prothrombin Time: 14.7 seconds (ref 11.6–15.2)
Prothrombin Time: 17.4 seconds — ABNORMAL HIGH (ref 11.6–15.2)

## 2011-12-19 LAB — CBC
HCT: 32.3 % — ABNORMAL LOW (ref 39.0–52.0)
HCT: 29 % — ABNORMAL LOW (ref 39.0–52.0)
Hemoglobin: 10.5 g/dL — ABNORMAL LOW (ref 13.0–17.0)
Hemoglobin: 9.5 g/dL — ABNORMAL LOW (ref 13.0–17.0)
Hemoglobin: 9.3 g/dL — ABNORMAL LOW (ref 13.0–17.0)
MCH: 29.1 pg (ref 26.0–34.0)
MCH: 28.9 pg (ref 26.0–34.0)
MCHC: 32.5 g/dL (ref 30.0–36.0)
MCHC: 33 g/dL (ref 30.0–36.0)
MCV: 87.6 fL (ref 78.0–100.0)
RBC: 3.61 MIL/uL — ABNORMAL LOW (ref 4.22–5.81)
RBC: 3.27 MIL/uL — ABNORMAL LOW (ref 4.22–5.81)
WBC: 12.5 10*3/uL — ABNORMAL HIGH (ref 4.0–10.5)

## 2011-12-19 LAB — COMPREHENSIVE METABOLIC PANEL
ALT: 20 U/L (ref 0–53)
AST: 25 U/L (ref 0–37)
Albumin: 2.7 g/dL — ABNORMAL LOW (ref 3.5–5.2)
Albumin: 2.2 g/dL — ABNORMAL LOW (ref 3.5–5.2)
Alkaline Phosphatase: 71 U/L (ref 39–117)
BUN: 20 mg/dL (ref 6–23)
CO2: 23 mEq/L (ref 19–32)
CO2: 19 mEq/L (ref 19–32)
Calcium: 8.1 mg/dL — ABNORMAL LOW (ref 8.4–10.5)
Chloride: 104 mEq/L (ref 96–112)
Creatinine, Ser: 0.94 mg/dL (ref 0.50–1.35)
GFR calc non Af Amer: >90 mL/min (ref >90)
Glucose, Bld: 300 mg/dL — ABNORMAL HIGH (ref 70–99)
Potassium: 6.6 mEq/L (ref 3.5–5.1)
Sodium: 131 mEq/L — ABNORMAL LOW (ref 135–145)
Total Bilirubin: 0.8 mg/dL (ref 0.3–1.2)
Total Protein: 5 g/dL — ABNORMAL LOW (ref 6.0–8.3)

## 2011-12-19 LAB — CARDIAC PANEL(CRET KIN+CKTOT+MB+TROPI)
CK, MB: 261.2 ng/mL (ref 0.3–4.0)
Relative Index: 2.8 — ABNORMAL HIGH (ref 0.0–2.5)
Relative Index: 15.8 — ABNORMAL HIGH (ref 0.0–2.5)
Total CK: 488 U/L — ABNORMAL HIGH (ref 7–232)
Total CK: 1478 U/L — ABNORMAL HIGH (ref 7–232)
Total CK: 1658 U/L — ABNORMAL HIGH (ref 7–232)
Troponin I: >25 ng/mL (ref <0.30)

## 2011-12-19 LAB — DIFFERENTIAL
Basophils Absolute: 0 10*3/uL (ref 0.0–0.1)
Lymphocytes Relative: 9 % — ABNORMAL LOW (ref 12–46)
Lymphs Abs: 1.3 10*3/uL (ref 0.7–4.0)
Monocytes Absolute: 1.5 10*3/uL — ABNORMAL HIGH (ref 0.1–1.0)
Monocytes Relative: 10 % (ref 3–12)
Neutro Abs: 12.6 10*3/uL — ABNORMAL HIGH (ref 1.7–7.7)

## 2011-12-19 LAB — ETHANOL: Alcohol, Ethyl (B): <11 mg/dL (ref 0–11)

## 2011-12-19 LAB — TSH: TSH: 0.07 u[IU]/mL — ABNORMAL LOW (ref 0.350–4.500)

## 2011-12-19 LAB — RAPID URINE DRUG SCREEN, HOSP PERFORMED
Barbiturates: NOT DETECTED
Benzodiazepines: POSITIVE — AB

## 2011-12-19 LAB — MRSA PCR SCREENING: MRSA by PCR: NEGATIVE

## 2011-12-19 MED ORDER — SODIUM CHLORIDE 0.9 % IV BOLUS (SEPSIS)
1000.0000 mL | Freq: Once | INTRAVENOUS | Status: AC
  Administered 2011-12-20: 1000 mL via INTRAVENOUS

## 2011-12-19 MED ORDER — CALCIUM CHLORIDE 10 % IV SOLN
1.0000 g | Freq: Once | INTRAVENOUS | Status: AC
  Administered 2011-12-19: 1 g via INTRAVENOUS
  Filled 2011-12-19: qty 10

## 2011-12-19 MED ORDER — ALPRAZOLAM 0.5 MG PO TABS
0.5000 mg | ORAL_TABLET | Freq: Three times a day (TID) | ORAL | Status: DC | PRN
  Administered 2011-12-19: 0.5 mg via ORAL
  Filled 2011-12-19: qty 1

## 2011-12-19 MED ORDER — ACETAMINOPHEN 160 MG/5ML PO SOLN
650.0000 mg | Freq: Four times a day (QID) | ORAL | Status: DC | PRN
  Administered 2011-12-20 – 2011-12-22 (×5): 650 mg
  Filled 2011-12-19 (×5): qty 20.3

## 2011-12-19 MED ORDER — HYDROMORPHONE HCL PF 1 MG/ML IJ SOLN
0.5000 mg | INTRAMUSCULAR | Status: DC | PRN
  Administered 2011-12-19 (×5): 1 mg via INTRAVENOUS
  Filled 2011-12-19 (×5): qty 1

## 2011-12-19 MED ORDER — FUROSEMIDE 10 MG/ML IJ SOLN
40.0000 mg | Freq: Once | INTRAMUSCULAR | Status: DC
  Filled 2011-12-19: qty 4

## 2011-12-19 MED ORDER — MORPHINE SULFATE 2 MG/ML IJ SOLN
1.0000 mg | Freq: Once | INTRAMUSCULAR | Status: AC
  Administered 2011-12-19: 1 mg via INTRAVENOUS

## 2011-12-19 MED ORDER — SUCCINYLCHOLINE CHLORIDE 20 MG/ML IJ SOLN
INTRAMUSCULAR | Status: AC
  Filled 2011-12-19: qty 10

## 2011-12-19 MED ORDER — PIPERACILLIN-TAZOBACTAM 3.375 G IVPB
3.3750 g | Freq: Three times a day (TID) | INTRAVENOUS | Status: DC
  Administered 2011-12-19 – 2011-12-22 (×9): 3.375 g via INTRAVENOUS
  Filled 2011-12-19 (×13): qty 50

## 2011-12-19 MED ORDER — MIDAZOLAM BOLUS VIA INFUSION
1.0000 mg | INTRAVENOUS | Status: DC | PRN
  Filled 2011-12-19: qty 2

## 2011-12-19 MED ORDER — NITROGLYCERIN 0.4 MG SL SUBL: 0.4000 mg | SUBLINGUAL_TABLET | SUBLINGUAL | Status: DC | PRN

## 2011-12-19 MED ORDER — METOPROLOL TARTRATE 12.5 MG HALF TABLET
12.5000 mg | ORAL_TABLET | Freq: Two times a day (BID) | ORAL | Status: DC
  Filled 2011-12-19: qty 1

## 2011-12-19 MED ORDER — ASPIRIN 325 MG PO TABS
325.0000 mg | ORAL_TABLET | Freq: Once | ORAL | Status: AC
  Administered 2011-12-19: 325 mg via ORAL
  Filled 2011-12-19: qty 1

## 2011-12-19 MED ORDER — VECURONIUM BROMIDE 10 MG IV SOLR
INTRAVENOUS | Status: AC
  Administered 2011-12-19: 10 mg
  Filled 2011-12-19: qty 10

## 2011-12-19 MED ORDER — FENTANYL BOLUS VIA INFUSION
50.0000 ug | Freq: Four times a day (QID) | INTRAVENOUS | Status: DC | PRN
  Administered 2011-12-20: 100 ug via INTRAVENOUS
  Administered 2011-12-20: 75 ug via INTRAVENOUS
  Administered 2011-12-20: 100 ug via INTRAVENOUS
  Administered 2011-12-30: 200 ug via INTRAVENOUS
  Filled 2011-12-19 (×2): qty 100

## 2011-12-19 MED ORDER — CLOPIDOGREL BISULFATE 300 MG PO TABS
300.0000 mg | ORAL_TABLET | Freq: Once | ORAL | Status: AC
  Administered 2011-12-19: 300 mg via ORAL
  Filled 2011-12-19: qty 1

## 2011-12-19 MED ORDER — MAGNESIUM SULFATE 40 MG/ML IJ SOLN
2.0000 g | Freq: Once | INTRAMUSCULAR | Status: AC
  Administered 2011-12-19: 2 g via INTRAVENOUS
  Filled 2011-12-19: qty 50

## 2011-12-19 MED ORDER — LACTATED RINGERS IV SOLN: INTRAVENOUS | Status: DC

## 2011-12-19 MED ORDER — MIDAZOLAM HCL 5 MG/ML IJ SOLN
INTRAMUSCULAR | Status: AC
  Administered 2011-12-19: 2 mg
  Filled 2011-12-19: qty 1

## 2011-12-19 MED ORDER — SODIUM CHLORIDE 0.9 % IV SOLN
INTRAVENOUS | Status: DC
  Administered 2011-12-19: 3.9 [IU]/h via INTRAVENOUS
  Filled 2011-12-19 (×2): qty 1

## 2011-12-19 MED ORDER — SODIUM CHLORIDE 0.9 % IV SOLN
1.0000 g | Freq: Once | INTRAVENOUS | Status: DC
  Filled 2011-12-19: qty 10

## 2011-12-19 MED ORDER — VANCOMYCIN HCL IN DEXTROSE 1-5 GM/200ML-% IV SOLN
1000.0000 mg | Freq: Three times a day (TID) | INTRAVENOUS | Status: DC
  Administered 2011-12-19 – 2011-12-21 (×6): 1000 mg via INTRAVENOUS
  Filled 2011-12-19 (×10): qty 200

## 2011-12-19 MED ORDER — OXYCODONE-ACETAMINOPHEN 5-325 MG PO TABS
1.0000 | ORAL_TABLET | ORAL | Status: DC | PRN
  Administered 2011-12-19: 2 via ORAL
  Filled 2011-12-19: qty 2

## 2011-12-19 MED ORDER — INSULIN ASPART 100 UNIT/ML ~~LOC~~ SOLN
7.0000 [IU] | Freq: Once | SUBCUTANEOUS | Status: AC
  Administered 2011-12-19: 7 [IU] via SUBCUTANEOUS

## 2011-12-19 MED ORDER — SODIUM POLYSTYRENE SULFONATE 15 GM/60ML PO SUSP
30.0000 g | Freq: Once | ORAL | Status: AC
  Administered 2011-12-19: 30 g
  Filled 2011-12-19: qty 120

## 2011-12-19 MED ORDER — SODIUM CHLORIDE 0.9 % IV SOLN
50.0000 ug/h | INTRAVENOUS | Status: DC
  Administered 2011-12-19: 100 ug/h via INTRAVENOUS
  Administered 2011-12-21: 150 ug/h via INTRAVENOUS
  Administered 2011-12-22: 175 ug/h via INTRAVENOUS
  Administered 2011-12-23 (×2): 275 ug/h via INTRAVENOUS
  Administered 2011-12-23: 250 ug/h via INTRAVENOUS
  Administered 2011-12-23: 275 ug/h via INTRAVENOUS
  Administered 2011-12-25: 150 ug/h via INTRAVENOUS
  Administered 2011-12-25: 170 ug/h via INTRAVENOUS
  Administered 2011-12-26: 60 ug/h via INTRAVENOUS
  Administered 2011-12-26 – 2011-12-27 (×2): 200 ug/h via INTRAVENOUS
  Administered 2011-12-28: 20 ug/h via INTRAVENOUS
  Administered 2011-12-28 – 2011-12-30 (×4): 200 ug/h via INTRAVENOUS
  Administered 2011-12-31: 250 ug/h via INTRAVENOUS
  Administered 2011-12-31: 300 ug/h via INTRAVENOUS
  Administered 2012-01-01: 290 ug/h via INTRAVENOUS
  Administered 2012-01-02 – 2012-01-03 (×3): 300 ug/h via INTRAVENOUS
  Filled 2011-12-19 (×31): qty 50

## 2011-12-19 MED ORDER — SODIUM CHLORIDE 0.9 % IV BOLUS (SEPSIS)
1000.0000 mL | Freq: Once | INTRAVENOUS | Status: AC
  Administered 2011-12-19: 1000 mL via INTRAVENOUS

## 2011-12-19 MED ORDER — BUPROPION HCL ER (SR) 150 MG PO TB12
150.0000 mg | ORAL_TABLET | Freq: Every day | ORAL | Status: DC
  Administered 2011-12-19: 150 mg via ORAL
  Filled 2011-12-19: qty 1

## 2011-12-19 MED ORDER — PHENYLEPHRINE HCL 10 MG/ML IJ SOLN
30.0000 ug/min | INTRAVENOUS | Status: DC
  Filled 2011-12-19 (×2): qty 1

## 2011-12-19 MED ORDER — SODIUM CHLORIDE 0.9 % IV SOLN
2.0000 mg/h | INTRAVENOUS | Status: DC
  Filled 2011-12-19: qty 10

## 2011-12-19 MED ORDER — SODIUM BICARBONATE 4.2 % IV SOLN
50.0000 meq | Freq: Once | INTRAVENOUS | Status: DC
  Filled 2011-12-19: qty 100

## 2011-12-19 MED ORDER — DEXTROSE 50 % IV SOLN
1.0000 | Freq: Once | INTRAVENOUS | Status: AC
  Administered 2011-12-19: 50 mL via INTRAVENOUS
  Filled 2011-12-19: qty 50

## 2011-12-19 MED ORDER — CLOPIDOGREL BISULFATE 75 MG PO TABS
75.0000 mg | ORAL_TABLET | Freq: Every day | ORAL | Status: DC
  Administered 2011-12-20 – 2011-12-22 (×3): 75 mg via ORAL
  Filled 2011-12-19 (×4): qty 1

## 2011-12-19 MED ORDER — INSULIN ASPART 100 UNIT/ML ~~LOC~~ SOLN: 5.0000 [IU] | Freq: Once | SUBCUTANEOUS | Status: DC

## 2011-12-19 MED ORDER — NITROGLYCERIN 0.4 MG SL SUBL
SUBLINGUAL_TABLET | SUBLINGUAL | Status: AC
  Administered 2011-12-19: 06:00:00
  Filled 2011-12-19: qty 25

## 2011-12-19 MED ORDER — MORPHINE SULFATE 2 MG/ML IJ SOLN
INTRAMUSCULAR | Status: AC
  Filled 2011-12-19: qty 1

## 2011-12-19 MED ORDER — ALUM & MAG HYDROXIDE-SIMETH 200-200-20 MG/5ML PO SUSP
30.0000 mL | Freq: Once | ORAL | Status: AC
  Administered 2011-12-19: 30 mL via ORAL
  Filled 2011-12-19: qty 30

## 2011-12-19 MED ORDER — HEPARIN (PORCINE) IN NACL 100-0.45 UNIT/ML-% IJ SOLN
15.0000 [IU]/kg/h | INTRAMUSCULAR | Status: DC
  Administered 2011-12-19: 12 [IU]/kg/h via INTRAVENOUS
  Filled 2011-12-19 (×2): qty 250

## 2011-12-19 MED ORDER — SODIUM CHLORIDE 0.9 % IV SOLN
50.0000 ug/h | INTRAVENOUS | Status: DC
  Filled 2011-12-19: qty 50

## 2011-12-19 MED ORDER — INSULIN REGULAR HUMAN 100 UNIT/ML IJ SOLN: 5.0000 [IU] | Freq: Once | INTRAMUSCULAR | Status: DC

## 2011-12-19 MED ORDER — FENTANYL CITRATE 0.05 MG/ML IJ SOLN
INTRAMUSCULAR | Status: AC
  Administered 2011-12-19: 100 ug
  Filled 2011-12-19: qty 2

## 2011-12-19 MED ORDER — SODIUM BICARBONATE 8.4 % IV SOLN
INTRAVENOUS | Status: AC
  Administered 2011-12-19: 100 meq
  Filled 2011-12-19: qty 100

## 2011-12-19 MED ORDER — METOPROLOL TARTRATE 25 MG PO TABS
25.0000 mg | ORAL_TABLET | Freq: Two times a day (BID) | ORAL | Status: DC
  Administered 2011-12-19: 25 mg via ORAL
  Filled 2011-12-19 (×3): qty 1

## 2011-12-19 MED ORDER — ETOMIDATE 2 MG/ML IV SOLN
INTRAVENOUS | Status: AC
  Administered 2011-12-19: 20 mg
  Filled 2011-12-19: qty 20

## 2011-12-19 MED ORDER — SODIUM CHLORIDE 0.9 % IV SOLN
2.0000 mg/h | INTRAVENOUS | Status: DC
  Administered 2011-12-19 – 2011-12-20 (×3): 5 mg/h via INTRAVENOUS
  Administered 2011-12-20: 3 mg/h via INTRAVENOUS
  Administered 2011-12-20: 1 mg/h via INTRAVENOUS
  Administered 2011-12-21: 3 mg/h via INTRAVENOUS
  Administered 2011-12-22: 5 mg/h via INTRAVENOUS
  Administered 2011-12-23 – 2011-12-26 (×5): 8 mg/h via INTRAVENOUS
  Administered 2011-12-26: 2 mg/h via INTRAVENOUS
  Administered 2011-12-27 – 2011-12-28 (×2): 8 mg/h via INTRAVENOUS
  Administered 2011-12-28: 5 mg/h via INTRAVENOUS
  Administered 2011-12-28: 6 mg/h via INTRAVENOUS
  Administered 2011-12-29 – 2011-12-31 (×6): 8 mg/h via INTRAVENOUS
  Administered 2011-12-31: 10 mg/h via INTRAVENOUS
  Administered 2011-12-31: 2.7 mg/h via INTRAVENOUS
  Administered 2011-12-31: 10 mg/h via INTRAVENOUS
  Administered 2011-12-31: 8 mg/h via INTRAVENOUS
  Administered 2012-01-01 – 2012-01-03 (×7): 10 mg/h via INTRAVENOUS
  Filled 2011-12-19 (×47): qty 10

## 2011-12-19 MED ORDER — PANTOPRAZOLE SODIUM 40 MG IV SOLR
40.0000 mg | Freq: Every day | INTRAVENOUS | Status: DC
  Administered 2011-12-19 – 2011-12-20 (×2): 40 mg via INTRAVENOUS
  Filled 2011-12-19 (×4): qty 40

## 2011-12-19 MED ORDER — METHOCARBAMOL 100 MG/ML IJ SOLN
500.0000 mg | Freq: Four times a day (QID) | INTRAVENOUS | Status: DC | PRN
  Administered 2011-12-19: 500 mg via INTRAVENOUS
  Filled 2011-12-19 (×3): qty 5

## 2011-12-19 MED ORDER — LIDOCAINE HCL (CARDIAC) 20 MG/ML IV SOLN
INTRAVENOUS | Status: AC
  Filled 2011-12-19: qty 5

## 2011-12-19 MED ORDER — MIDAZOLAM BOLUS VIA INFUSION
1.0000 mg | INTRAVENOUS | Status: DC | PRN
  Administered 2011-12-20: 2 mg via INTRAVENOUS
  Filled 2011-12-19 (×2): qty 2

## 2011-12-19 MED ORDER — ROCURONIUM BROMIDE 50 MG/5ML IV SOLN
INTRAVENOUS | Status: AC
  Filled 2011-12-19: qty 2

## 2011-12-19 MED ORDER — FENTANYL BOLUS VIA INFUSION
50.0000 ug | Freq: Four times a day (QID) | INTRAVENOUS | Status: DC | PRN
  Filled 2011-12-19: qty 100

## 2011-12-19 MED ORDER — SODIUM CHLORIDE 0.45 % IV SOLN
INTRAVENOUS | Status: DC
  Administered 2011-12-20: 50 mL/h via INTRAVENOUS
  Administered 2011-12-21: 21:00:00 via INTRAVENOUS

## 2011-12-19 MED ORDER — NOREPINEPHRINE BITARTRATE 1 MG/ML IJ SOLN
2.0000 ug/min | INTRAMUSCULAR | Status: DC
  Administered 2011-12-19: 5 ug/min via INTRAVENOUS
  Filled 2011-12-19: qty 4

## 2011-12-19 MED ORDER — ENOXAPARIN SODIUM 30 MG/0.3ML ~~LOC~~ SOLN
30.0000 mg | Freq: Two times a day (BID) | SUBCUTANEOUS | Status: DC
  Administered 2011-12-19: 30 mg via SUBCUTANEOUS
  Filled 2011-12-19 (×4): qty 0.3

## 2011-12-19 NOTE — Progress Notes (Signed)
eLink Physician-Brief Progress Note Patient Name: Reginald Gutierrez DOB: 10-05-1965 MRN: [id number]  Date of Service  12/19/2011   HPI/Events of Note   Insulin gtt for high CBgs Rpt Hb stable PH improved on abg, good UO  eICU Interventions  Start IV heparin for NSTEMI Kayexalate given - await rpt K      [name] V. 12/19/2011, 7:04 PM

## 2011-12-19 NOTE — Code Documentation (Signed)
Called to room 1408 at 1230pm, with patient awake, alert, restless, and diaphoretic.  BP 60/40, HR 86 resp 24 pulse ox 86 %.  Placed patient on venti mask 50%.  Pulse ox 98%  Pt would not wear face mask, pt then proceeded to have a seizure.  Pt cyanotic, did not lose pulses, bagged patient with 100 ambubag.    Pt remains diaphoretic, abg drawn per RT.  Attending at bedside and she spoke to cardiology and CCM.  Pt with BP 80/50  HR 86 resp 20 and pulse ox 100%. Notified unit that pt transferring to 1225.  Pt moved to 1225 on O2 and portable monitor.  Placed in 1225 and Dr. Tyson Alias at bedside.  Spoke with family, and pt to be intubated and CVC inserted.  Kenzly Rogoff Debroah Loop RN

## 2011-12-19 NOTE — Progress Notes (Signed)
eLink Physician-Brief Progress Note Patient Name: Reginald Gutierrez DOB: 10-27-1965 MRN: [id number]  Date of Service  12/19/2011   HPI/Events of Note  Temp of 103.1   eICU Interventions  Plan: Tylenol 650 mg via tube q6 hours.  If no response will try IV tylenol.   Intervention Category Minor Interventions: Routine modifications to care plan (e.g. PRN medications for pain, fever)  Angelynn Lemus 12/19/2011, 11:55 PM

## 2011-12-19 NOTE — Consult Note (Signed)
Name: Reginald Gutierrez MRN: [id number] DOB: 01-May-1966    LOS: 1  PCCM ADMISSION NOTE  History of Present Illness: 46 yr old type 1 DM, prior coc abuse admitted with bilateral tibia Fx. To OR 4/19 for repair of tibai Fx. In am 4/20 acute SOB, hypoxia, confusin, hypoxia and refractory Shock. To ICU, confused, some tan secretions. No CP. Had small rise trop noted, ECG no major ST changes. Found to be acidotic , hypoxic required emergent intubation. Pressors treated and A line require.Unclear etiology.  Lines / Drains: 4/20 ETT>>> 4/20 line rt ij>>> 4/20 Aline rt fem>>>  Abx: Zosyn 4/20>>> vanc 4/20>>>  Cultures: BC 4/20>>> Sputum 4/20>>>  Tests / Events: 4/19- repair bilat tibia Fx 4/20- refractory shock, hypoxic resp failure., intubated  The patient is sedated, intubated and unable to provide history, which was obtained for available medical records.    Past Medical History  Diagnosis Date  . Diabetes mellitus type 1   . Hyperlipidemia   . Hypothyroidism   . Depression   . Umbilical hernia   . Hypertension    Past Surgical History  Procedure Date  . Appendectomy     open   Prior to Admission medications   Medication Sig Start Date End Date Taking? Authorizing Provider  atorvastatin (LIPITOR) 40 MG tablet Take 40 mg by mouth daily.    Yes Historical Provider, MD  buPROPion (WELLBUTRIN SR) 150 MG 12 hr tablet Take 150 mg by mouth daily.   Yes Historical Provider, MD  insulin glargine (LANTUS) 100 UNIT/ML injection Inject 25 Units into the skin at bedtime.    Yes Historical Provider, MD  insulin lispro (HUMALOG) 100 UNIT/ML injection Inject 5-10 Units into the skin 3 (three) times daily before meals.    Yes Historical Provider, MD  levothyroxine (SYNTHROID, LEVOTHROID) 200 MCG tablet Take 200 mcg by mouth daily.     Yes Historical Provider, MD  lisinopril (PRINIVIL,ZESTRIL) 40 MG tablet Take 40 mg by mouth daily. For blood pressure   Yes Historical Provider, MD    Allergies No Known Allergies  Family History History reviewed. No pertinent family history.  Social History  reports that he has been smoking Cigarettes.  He has been smoking about .5 packs per day. He has never used smokeless tobacco. He reports that he does not drink alcohol or use illicit drugs. coc habit in past  Review Of Systems  11 points review of systems is negative with an exception of listed in HPI.  Vital Signs: Temp:  [98 F (36.7 C)-98.5 F (36.9 C)] 98 F (36.7 C) (04/20 0525) Pulse Rate:  [96-130] 128  (04/20 0545) Resp:  [10-19] 18  (04/20 0545) BP: (64-158)/(35-90) 67/42 mmHg (04/20 1145) SpO2:  [88 %-98 %] 90 % (04/20 0545) FiO2 (%):  [100 %] 100 % (04/20 1319) Weight:  [80.287 kg (177 lb)] 80.287 kg (177 lb) (04/20 0120) I/O last 3 completed shifts: In: 3255 [I.V.:2200; IV Piggyback:1055] Out: 1850 [Urine:1475; Blood:375]  Physical Examination: General:  Confused and obtunded most of time Neuro:  Nonfocal, equal power, perrl 2 mm HEENT:  obese Neck:  Obsese, no overt jvd Cardiovascular:  s1 s2 rrt reduced Lungs:  Rhonchi, crackles Abdomen:  Soft, BS wnl no r, reducible hernia Musculoskeletal: bilat lower ext  casts Skin:  No rash  Ventilator settings: Vent Mode:  [-] PRVC FiO2 (%):  [100 %] 100 % Set Rate:  [30 bmp] 30 bmp Vt Set:  [450 mL] 450 mL PEEP:  [5 cmH20]  5 cmH20  Labs and Imaging:  Reviewed.  Please refer to the Assessment and Plan section for relevant results.  Assessment and Plan: 1. Refractory Shock Etiology: r/o cardiogenic (ischemic) vs septic, r/o obstructive PE , r/o fat emboli syndrome -phenylephrine stat ordered and hung, will transition to levophed to MAP 65 -no fever, pcxr edema?, hold off septic shock protocol -cvp -lactic acid, pct -aline STAT -cortisol then stress steroids -paged cards to consider STAT echo assessment RV -May need heparin empiric, however has dropped hct and will discuss safety with ortho  now  2. Hypoxic resp failure -concern pulm edema -ischemia, st depression now resolved on follow up ecg -concern is cardiogenic shock with pulm edema from NSTEMI -ABg STAT reviewed, rate 35, fio2 to 60% if unable then peep to 8 -holding lasix as can ventilate and oxygentae for now and shock abg in 1 hr  3. NSTEMI ( st changes back to normal at 11 am from 5 am ecg -from outside stress vs primary event -d/w cards, ECHO stat -plavix given, asa -shock, dc beta blocker -tox screen if pos copc, avoid beta blcoker -statin  4. R/o fat emoboli syndrome -confusion out of proportion to ph , shock state CT chest STAT -may need heparin if PE -supportive if fat emboli Echo assess rv STAT, although with edema would not expect left side failure of course  5. Change in MS -r/o metabolic enceph, r/o fat emboli -requires versed, fent STAT -rass -2 goal -require paralysis  6. R/o sepsis, aspiration STAT vanc, zosyn BC sent No EGDT , pulm edema Lactic acid Pct  7. DM, hyperglycemia Dc lantus, NPO cbg q1h, SSI  8. Tibial fx -may need to dc lov for hep, see cards Updated ortho, they are ok with heparin if life saving of course  Best practices / Disposition: -->ICU status under PCCM -->full code -->loevnox for DVT Px -->Protonix for GI Px -->ventilator bundle -->diet -->family updated at bedside. Sister was present during all procedures and updated in full and mom  The patient is critically ill with multiple organ systems failure and requires high complexity decision making for assessment and support, frequent evaluation and titration of therapies, application of advanced monitoring technologies and extensive interpretation of multiple databases. Critical Care Time devoted to patient care services described in this note is 90 minutes.  [name] 12/19/2011, 1:54 PM

## 2011-12-19 NOTE — Consult Note (Signed)
Reason for Consult:Troponin elevation Referring Physician: Dr Shana Chute is an 46 y.o. male.  HPI: Patient is a 46 yr old man who had a syncopal episode from low blood sugar sustaining communitted fracture of his right tibia and fibula. Post op he developed chest pain 10/10,now 8/10, sharp in nature, non radiating.Pain was associated with SOB and nausea.Pain mildly relieved by ASA. No diaphoresis, no dizziness, no palpitations, no orthopnea or PND. EKG reveals 2mm ST depression in leads V4-6. Troponin is elevated at 0.79. His risk factors include HTN,DM,HLD and smoking. Picture is consistent with NSTEMI.  Past Medical History  Diagnosis Date  . Diabetes mellitus type 1   . Hyperlipidemia   . Hypothyroidism   . Depression   . Umbilical hernia   . Hypertension     Past Surgical History  Procedure Date  . Appendectomy     open    History reviewed. No pertinent family history.  Social History:  reports that he has been smoking Cigarettes.  He has been smoking about .5 packs per day. He has never used smokeless tobacco. He reports that he does not drink alcohol or use illicit drugs.  Allergies: No Known Allergies  Medications: I have reviewed the patient's current medications.  Results for orders placed during the hospital encounter of 12/18/11 (from the past 48 hour(s))  GLUCOSE, CAPILLARY     Status: Abnormal   Collection Time   12/18/11  1:40 PM      Component Value Range Comment   Glucose-Capillary 145 (*) 70 - 99 (mg/dL)   CBC     Status: Abnormal   Collection Time   12/18/11  2:10 PM      Component Value Range Comment   WBC 17.7 (*) 4.0 - 10.5 (K/uL)    RBC 4.86  4.22 - 5.81 (MIL/uL)    Hemoglobin 14.5  13.0 - 17.0 (g/dL)    HCT 16.1  09.6 - 04.5 (%)    MCV 89.1  78.0 - 100.0 (fL)    MCH 29.8  26.0 - 34.0 (pg)    MCHC 33.5  30.0 - 36.0 (g/dL)    RDW 40.9  81.1 - 91.4 (%)    Platelets 350  150 - 400 (K/uL)   DIFFERENTIAL     Status: Abnormal   Collection  Time   12/18/11  2:10 PM      Component Value Range Comment   Neutrophils Relative 91 (*) 43 - 77 (%)    Neutro Abs 16.1 (*) 1.7 - 7.7 (K/uL)    Lymphocytes Relative 6 (*) 12 - 46 (%)    Lymphs Abs 1.0  0.7 - 4.0 (K/uL)    Monocytes Relative 4  3 - 12 (%)    Monocytes Absolute 0.6  0.1 - 1.0 (K/uL)    Eosinophils Relative 0  0 - 5 (%)    Eosinophils Absolute 0.0  0.0 - 0.7 (K/uL)    Basophils Relative 0  0 - 1 (%)    Basophils Absolute 0.0  0.0 - 0.1 (K/uL)   BASIC METABOLIC PANEL     Status: Abnormal   Collection Time   12/18/11  2:10 PM      Component Value Range Comment   Sodium 133 (*) 135 - 145 (mEq/L)    Potassium 4.7  3.5 - 5.1 (mEq/L)    Chloride 101  96 - 112 (mEq/L)    CO2 20  19 - 32 (mEq/L)    Glucose, Bld 238 (*)  70 - 99 (mg/dL)    BUN 20  6 - 23 (mg/dL)    Creatinine, Ser 6.57  0.50 - 1.35 (mg/dL)    Calcium 9.3  8.4 - 10.5 (mg/dL)    GFR calc non Af Amer >90  >90 (mL/min)    GFR calc Af Amer >90  >90 (mL/min)   SAMPLE TO BLOOD BANK     Status: Normal   Collection Time   12/18/11  2:10 PM      Component Value Range Comment   Blood Bank Specimen SAMPLE AVAILABLE FOR TESTING      Sample Expiration 12/21/2011     GLUCOSE, CAPILLARY     Status: Abnormal   Collection Time   12/18/11  3:00 PM      Component Value Range Comment   Glucose-Capillary 353 (*) 70 - 99 (mg/dL)    Comment 1 Documented in Chart      Comment 2 Notify RN     GLUCOSE, CAPILLARY     Status: Abnormal   Collection Time   12/18/11  5:35 PM      Component Value Range Comment   Glucose-Capillary 424 (*) 70 - 99 (mg/dL)   GLUCOSE, CAPILLARY     Status: Abnormal   Collection Time   12/18/11  7:37 PM      Component Value Range Comment   Glucose-Capillary 290 (*) 70 - 99 (mg/dL)   GLUCOSE, CAPILLARY     Status: Abnormal   Collection Time   12/18/11  9:25 PM      Component Value Range Comment   Glucose-Capillary 131 (*) 70 - 99 (mg/dL)   GLUCOSE, CAPILLARY     Status: Normal   Collection Time    12/18/11 10:23 PM      Component Value Range Comment   Glucose-Capillary 92  70 - 99 (mg/dL)   GLUCOSE, CAPILLARY     Status: Abnormal   Collection Time   12/18/11 11:01 PM      Component Value Range Comment   Glucose-Capillary 105 (*) 70 - 99 (mg/dL)   GLUCOSE, CAPILLARY     Status: Abnormal   Collection Time   12/19/11  1:45 AM      Component Value Range Comment   Glucose-Capillary 213 (*) 70 - 99 (mg/dL)    Comment 1 Notify RN     URINE RAPID DRUG SCREEN (HOSP PERFORMED)     Status: Abnormal   Collection Time   12/19/11  2:34 AM      Component Value Range Comment   Opiates NONE DETECTED  NONE DETECTED     Cocaine NONE DETECTED  NONE DETECTED     Benzodiazepines POSITIVE (*) NONE DETECTED     Amphetamines NONE DETECTED  NONE DETECTED     Tetrahydrocannabinol NONE DETECTED  NONE DETECTED     Barbiturates NONE DETECTED  NONE DETECTED    ETHANOL     Status: Normal   Collection Time   12/19/11  3:46 AM      Component Value Range Comment   Alcohol, Ethyl (B) <11  0 - 11 (mg/dL)   MAGNESIUM     Status: Normal   Collection Time   12/19/11  3:46 AM      Component Value Range Comment   Magnesium 1.6  1.5 - 2.5 (mg/dL)   PHOSPHORUS     Status: Normal   Collection Time   12/19/11  3:46 AM      Component Value Range Comment   Phosphorus 4.1  2.3 - 4.6 (mg/dL)   PROTIME-INR     Status: Normal   Collection Time   12/19/11  3:46 AM      Component Value Range Comment   Prothrombin Time 14.7  11.6 - 15.2 (seconds)    INR 1.13  0.00 - 1.49    PRO B NATRIURETIC PEPTIDE     Status: Normal   Collection Time   12/19/11  3:46 AM      Component Value Range Comment   Pro B Natriuretic peptide (BNP) 118.4  0 - 125 (pg/mL)   CARDIAC PANEL(CRET KIN+CKTOT+MB+TROPI)     Status: Abnormal   Collection Time   12/19/11  3:46 AM      Component Value Range Comment   Total CK 488 (*) 7 - 232 (U/L)    CK, MB 13.5 (*) 0.3 - 4.0 (ng/mL)    Troponin I 0.79 (*) <0.30 (ng/mL)    Relative Index 2.8 (*) 0.0 -  2.5    COMPREHENSIVE METABOLIC PANEL     Status: Abnormal   Collection Time   12/19/11  3:46 AM      Component Value Range Comment   Sodium 131 (*) 135 - 145 (mEq/L)    Potassium 5.5 (*) 3.5 - 5.1 (mEq/L)    Chloride 100  96 - 112 (mEq/L)    CO2 23  19 - 32 (mEq/L)    Glucose, Bld 343 (*) 70 - 99 (mg/dL)    BUN 19  6 - 23 (mg/dL)    Creatinine, Ser 1.61  0.50 - 1.35 (mg/dL)    Calcium 8.1 (*) 8.4 - 10.5 (mg/dL)    Total Protein 5.0 (*) 6.0 - 8.3 (g/dL)    Albumin 2.7 (*) 3.5 - 5.2 (g/dL)    AST 25  0 - 37 (U/L)    ALT 20  0 - 53 (U/L)    Alkaline Phosphatase 82  39 - 117 (U/L)    Total Bilirubin 0.9  0.3 - 1.2 (mg/dL)    GFR calc non Af Amer >90  >90 (mL/min)    GFR calc Af Amer >90  >90 (mL/min)   CBC     Status: Abnormal   Collection Time   12/19/11  3:46 AM      Component Value Range Comment   WBC 15.4 (*) 4.0 - 10.5 (K/uL)    RBC 3.61 (*) 4.22 - 5.81 (MIL/uL)    Hemoglobin 10.5 (*) 13.0 - 17.0 (g/dL)    HCT 09.6 (*) 04.5 - 52.0 (%)    MCV 89.5  78.0 - 100.0 (fL)    MCH 29.1  26.0 - 34.0 (pg)    MCHC 32.5  30.0 - 36.0 (g/dL)    RDW 40.9  81.1 - 91.4 (%)    Platelets 347  150 - 400 (K/uL)   DIFFERENTIAL     Status: Abnormal   Collection Time   12/19/11  3:46 AM      Component Value Range Comment   Neutrophils Relative 82 (*) 43 - 77 (%)    Neutro Abs 12.6 (*) 1.7 - 7.7 (K/uL)    Lymphocytes Relative 9 (*) 12 - 46 (%)    Lymphs Abs 1.3  0.7 - 4.0 (K/uL)    Monocytes Relative 10  3 - 12 (%)    Monocytes Absolute 1.5 (*) 0.1 - 1.0 (K/uL)    Eosinophils Relative 0  0 - 5 (%)    Eosinophils Absolute 0.0  0.0 - 0.7 (K/uL)  Basophils Relative 0  0 - 1 (%)    Basophils Absolute 0.0  0.0 - 0.1 (K/uL)     Dg Tibia/fibula Left  12/18/2011  *RADIOLOGY REPORT*  Clinical Data: Postop IM nail tib-fib fracture  LEFT TIBIA AND FIBULA - 2 VIEW  Comparison: 12/18/2011  Findings: IM nail with two proximal and two distal interlocking screws transfixing an oblique distal tibial  fracture.  The fracture fragments are now in near anatomic alignment and position.  Possible fibular fracture is not imaged.  IMPRESSION: Satisfactory appearance status post ORIF of a distal tibial fracture.  Original Report Authenticated By: Charline Bills, M.D.   Dg Tibia/fibula Left  12/18/2011  *RADIOLOGY REPORT*  Clinical Data: 46 year old male with possible fall.  Lower extremity deformity.  Hypoglycemia.  LEFT TIBIA AND FIBULA - 2 VIEW  Comparison: None.  Findings: Spiral fracture of the distal third left tibia shaft. Distal fragment posteriorly displaced by one half shaft with, laterally displaced by one half shaft with, and anteriorly angulated.  Spiral fracture of the left fibula proximal third shaft.  Lateral displacement of nearly one full shaft with.  Anterior displacement of less than one full shaft width and posterior angulation.  Grossly normal alignment at the left knee and ankle.  IMPRESSION: 1.  Distal third left tibia shaft spiral fracture with displacement and angulation as above. 2.  Proximal third left fibula spiral shaft fracture.  Original Report Authenticated By: Harley Hallmark, M.D.   Dg Tibia/fibula Right  12/18/2011  *RADIOLOGY REPORT*  Clinical Data: Postop IM nail of tib-fib fracture  RIGHT TIBIA AND FIBULA - 2 VIEW  Comparison: 12/18/2011  Findings: IM nail with two proximal and two distal interlocking screws transfixing an oblique/spiral distal tibial fracture.  The fracture fragments are in near anatomic alignment and position.  No proximal fibular fracture is not well visualized.  IMPRESSION: Satisfactory appearance status post picture ORIF of a distal tibial fracture.  Original Report Authenticated By: Charline Bills, M.D.   Dg Tibia/fibula Right  12/18/2011  *RADIOLOGY REPORT*  Clinical Data: Hypoglycemia.  Fall.  Pain and deformity.  RIGHT TIBIA AND FIBULA - 2 VIEW  Comparison: None.  Findings: There is a spiral fracture of the distal tibial diaphysis with lateral  displacement of the distal fragment by 8 mm.  There is an oblique fracture of the proximal diaphysis of the fibula. Distal fragment is displaced laterally 5 mm.  IMPRESSION: Oblique fracture of the proximal fibular diaphysis with lateral displacement of the distal fragment 5 mm.  Spiral fracture of the distal tibial diaphysis with lateral displacement of the distal fragment 7 mm.  Original Report Authenticated By: Thomasenia Sales, M.D.    Review of Systems  Constitutional: Negative.   HENT: Negative.   Eyes: Negative.   Respiratory: Positive for shortness of breath.   Cardiovascular: Positive for chest pain.  Gastrointestinal: Positive for nausea.  Genitourinary: Negative.   Musculoskeletal:       S/p tib/fib fracture s/p surgery  Skin: Negative.   Neurological: Negative.   Psychiatric/Behavioral: Negative.    Blood pressure 133/72, pulse 109, temperature 98.1 F (36.7 C), temperature source Oral, resp. rate 14, height 5\' 6"  (1.676 m), weight 177 lb (80.287 kg), SpO2 93.00%. Physical Exam  Constitutional: He is oriented to person, place, and time. He appears well-developed and well-nourished. He appears distressed.       Painful distress  HENT:  Head: Normocephalic.  Mouth/Throat: No oropharyngeal exudate.  Eyes: Right eye exhibits no discharge. No scleral  icterus.  Neck: No JVD present. No tracheal deviation present. No thyromegaly present.  Cardiovascular: Regular rhythm, S1 normal, S2 normal and normal pulses.  Tachycardia present.   Murmur heard.  Systolic murmur is present with a grade of 1/6  Respiratory: Breath sounds normal.  GI: Soft. There is no tenderness.  Musculoskeletal:       S/p right intramedullary pinning of tib/fib communited fracture  Neurological: He is alert and oriented to person, place, and time.  Skin: Skin is warm and dry.  Psychiatric: He has a normal mood and affect.    Assessment/Plan: NSTEMI  Recommendations ASA 325mg  STAT Plavix 300mg  STAT  then 75mg  daily Spoke with Dr Thomasena Edis (Orthopedic surgeon) risk of anticoagulation at this time(7hrs post op) includes compartmental syndrome with need for re-operation and possible. Compartmental syndrome can be a threat to limb loss as well. Given this, I will hold recommendations for heparin anticoagulation for now. Once risk for compartmental syndrome is deemed low enough, pls start Heparin drip. Statin Betablockers ACEI NTG 0.5 STAT q X 3 doses Start NTG drip if third dose of NTG sublingual does not relieve pain and transfer to intensive care unit 2D echo  Serial EKGs and cardiac enzymes Once patient is cleared for possible anticoagulation, he can have a cardiac catheterization done (possibly Monday).    Nasra Counce 12/19/2011, 7:02 AM

## 2011-12-19 NOTE — Progress Notes (Signed)
VASCULAR LAB PRELIMINARY  PRELIMINARY  PRELIMINARY  PRELIMINARY  Bilateral lower extremity venous Dopplers completed.    Preliminary report:  Limited study secondary to surgical dressings.  No obvious evidence of DVT noted in the right popliteal or femoral veins.  Unable to visualize common femoral secondary to line and dressings in groin.  No obvious evidence of DVT noted in the femoral or common femoral veins of the left lower extremities.  Sherren Kerns [place], 12/19/2011, 4:53 PM

## 2011-12-19 NOTE — Progress Notes (Signed)
Subjective:  Still having chest pain.  Has not received beta blocker yet. Heart rate 132 sinus tachycardia. Mild dyspnea.  Objective:  Vital Signs in the last 24 hours: Temp:  [97.4 F (36.3 C)-98.5 F (36.9 C)] 98.1 F (36.7 C) (04/20 0015) Pulse Rate:  [96-110] 109  (04/20 0102) Resp:  [10-22] 14  (04/20 0102) BP: (132-158)/(52-79) 133/72 mmHg (04/20 0102) SpO2:  [93 %-99 %] 93 % (04/20 0102) Weight:  [80.287 kg (177 lb)] 80.287 kg (177 lb) (04/20 0120)  Intake/Output from previous day: 04/19 0701 - 04/20 0700 In: 3255 [I.V.:2200; IV Piggyback:1055] Out: 1850 [Urine:1475; Blood:375] Intake/Output from this shift:       . alum & mag hydroxide-simeth  30 mL Oral Once  . aspirin  325 mg Oral Once  . atorvastatin  40 mg Oral q1800  . buPROPion  150 mg Oral Daily  . clopidogrel  300 mg Oral Once  . clopidogrel  75 mg Oral Q breakfast  . enoxaparin (LOVENOX) injection  30 mg Subcutaneous Q12H  . HYDROmorphone      . insulin aspart  0-5 Units Subcutaneous QHS  . insulin aspart  0-9 Units Subcutaneous TID WC  . insulin glargine  25 Units Subcutaneous QHS  . levothyroxine  200 mcg Oral QAC breakfast  . lisinopril  40 mg Oral Daily  . metoprolol tartrate  25 mg Oral BID  .  morphine injection  1 mg Intravenous Once  . morphine      . nitroGLYCERIN      . sodium chloride  3 mL Intravenous Q12H  . DISCONTD: buPROPion  100 mg Oral BID  . DISCONTD: enoxaparin  30 mg Subcutaneous Q24H  . DISCONTD: enoxaparin (LOVENOX) injection  40 mg Subcutaneous Q24H      . sodium chloride 125 mL/hr at 12/19/11 0121  . lactated ringers    . lactated ringers      Physical Exam: The patient appears to be in moderate distress.  Head and neck exam reveals that the pupils are equal and reactive.  The extraocular movements are full.  There is no scleral icterus.  Mouth and pharynx are benign.  No lymphadenopathy.  No carotid bruits.  The jugular venous pressure is normal.  Thyroid is not  enlarged or tender.  Chest is clear to percussion and auscultation.  No rales or rhonchi.  Expansion of the chest is symmetrical.  Heart reveals no abnormal lift or heave.  First and second heart sounds are normal.  There is no murmur gallop rub or click. Pulse rapid and regular.  The abdomen is soft and nontender.  Bowel sounds are normoactive.  There is no hepatosplenomegaly or mass.  There are no abdominal bruits.  Extremities: Both legs in post-op wraps..  Neurologic exam is normal strength and no lateralizing weakness.  No sensory deficits.  Integument reveals no rash  Lab Results:  Basename 12/19/11 0346 12/18/11 1410  WBC 15.4* 17.7*  HGB 10.5* 14.5  PLT 347 350    Basename 12/19/11 0346 12/18/11 1410  NA 131* 133*  K 5.5* 4.7  CL 100 101  CO2 23 20  GLUCOSE 343* 238*  BUN 19 20  CREATININE 0.94 0.69    Basename 12/19/11 0346  TROPONINI 0.79*   Hepatic Function Panel  Basename 12/19/11 0346  PROT 5.0*  ALBUMIN 2.7*  AST 25  ALT 20  ALKPHOS 82  BILITOT 0.9  BILIDIR --  IBILI --   No results found for this basename: CHOL  in the last 72 hours No results found for this basename: PROTIME in the last 72 hours  Imaging: Dg Tibia/fibula Left  12/18/2011  *RADIOLOGY REPORT*  Clinical Data: Postop IM nail tib-fib fracture  LEFT TIBIA AND FIBULA - 2 VIEW  Comparison: 12/18/2011  Findings: IM nail with two proximal and two distal interlocking screws transfixing an oblique distal tibial fracture.  The fracture fragments are now in near anatomic alignment and position.  Possible fibular fracture is not imaged.  IMPRESSION: Satisfactory appearance status post ORIF of a distal tibial fracture.  Original Report Authenticated By: Charline Bills, M.D.   Dg Tibia/fibula Left  12/18/2011  *RADIOLOGY REPORT*  Clinical Data: 46 year old male with possible fall.  Lower extremity deformity.  Hypoglycemia.  LEFT TIBIA AND FIBULA - 2 VIEW  Comparison: None.  Findings: Spiral  fracture of the distal third left tibia shaft. Distal fragment posteriorly displaced by one half shaft with, laterally displaced by one half shaft with, and anteriorly angulated.  Spiral fracture of the left fibula proximal third shaft.  Lateral displacement of nearly one full shaft with.  Anterior displacement of less than one full shaft width and posterior angulation.  Grossly normal alignment at the left knee and ankle.  IMPRESSION: 1.  Distal third left tibia shaft spiral fracture with displacement and angulation as above. 2.  Proximal third left fibula spiral shaft fracture.  Original Report Authenticated By: Harley Hallmark, M.D.   Dg Tibia/fibula Right  12/18/2011  *RADIOLOGY REPORT*  Clinical Data: Postop IM nail of tib-fib fracture  RIGHT TIBIA AND FIBULA - 2 VIEW  Comparison: 12/18/2011  Findings: IM nail with two proximal and two distal interlocking screws transfixing an oblique/spiral distal tibial fracture.  The fracture fragments are in near anatomic alignment and position.  No proximal fibular fracture is not well visualized.  IMPRESSION: Satisfactory appearance status post picture ORIF of a distal tibial fracture.  Original Report Authenticated By: Charline Bills, M.D.   Dg Tibia/fibula Right  12/18/2011  *RADIOLOGY REPORT*  Clinical Data: Hypoglycemia.  Fall.  Pain and deformity.  RIGHT TIBIA AND FIBULA - 2 VIEW  Comparison: None.  Findings: There is a spiral fracture of the distal tibial diaphysis with lateral displacement of the distal fragment by 8 mm.  There is an oblique fracture of the proximal diaphysis of the fibula. Distal fragment is displaced laterally 5 mm.  IMPRESSION: Oblique fracture of the proximal fibular diaphysis with lateral displacement of the distal fragment 5 mm.  Spiral fracture of the distal tibial diaphysis with lateral displacement of the distal fragment 7 mm.  Original Report Authenticated By: Thomasenia Sales, M.D.    Cardiac Studies:  Assessment/Plan:    Patient Active Hospital Problem List: Tibial fracture (12/18/2011)   Assessment: Per ortho   Plan: No IV heparin at this point because of high risk of developing compartment syndrome.  DIABETES MELLITUS, TYPE I (05/14/2010)   Assessment: Blood sugars poorly controlled at present   Plan: Adjust insulin per primary hospitalist. HYPERCHOLESTEROLEMIA (05/14/2010)   Assessment: On statin   Plan: Check lipid panel  NSTEMI (non-ST elevated myocardial infarction) (12/19/2011)   Assessment: Still tachycardic   Plan: Beta blocker, ASA,Plavix, ACEi ,statin, SL NTG   LOS: 1 day    Cassell Clement 12/19/2011, 7:42 AM

## 2011-12-19 NOTE — Progress Notes (Signed)
[facility] hospitalist progress note. Chief complaint. Elevated troponin, chest pain. History of present illness. This 46 year old male with history depression, diabetes, hypertension was admitted following a syncopal event. During his fall he sustained a tibia fracture bilaterally. He was taken to surgery by Dr. Thomasena Edis. Cardiac enzymes were ordered cycled every 8 hours for 3 sets and the first set has now resulted showing CK elevated 488, MB high at 13.5 and troponin elevated at 0.79. I came up to see the patient for this elevated troponin and requested a 12-lead EKG be done. This appears significant for ST depression in V4 through V6. The patient has had no complaints of chest pain until now where he complains of central chest pain. He has been given one of 3 sublingual nitroglycerin with some relief reported but not totally foveae chest pain. There is no associated radiation, diaphoresis, or nausea. Patient denies any personal history of coronary artery disease. He has not previously seen a cardiologist. He states he may have a positive family history for coronary artery disease but is really unsure. Vital signs. Temperature 98.1, pulse 130, respiration 14, blood pressure 133/72. O2 sats 93%. General appearance. Well-developed but somewhat obese middle-aged male. He complaints of chest pain but is in no visible distress. Cardiac. Rate tachycardic, rhythm regular. 12-lead EKG suggests sinus tachycardia. No jugular venous distention or significant edema. Lungs. Breath sounds are clear and equal bilaterally. Abdomen. Soft and obese with positive bowel sounds. No pain. Impression/plan. Problem #1 chest pain, EKG changes, elevated troponin. Will continue to administer nitroglycerin sublingual for a total of 3 doses as needed for chest pain relief. I will also give him one dose of morphine sulfate 1 mg IV. I did contact [facility] on-call and spoke with Dr. Fausto Skillern. He will see the patient as soon as  possible this a.m. I will defer initiation of heparin to cardiology and surgery. I will give the patient a one-time dose of aspirin 325 mg. Further cardiac enzymes are pending.

## 2011-12-19 NOTE — Procedures (Signed)
Arterial Catheter Insertion Procedure Note YURI FANA [id number] [birth year]  Procedure: Insertion of Arterial Catheter  Indications: Blood pressure monitoring  Procedure Details Consent: Risks of procedure as well as the alternatives and risks of each were explained to the (patient/caregiver).  Consent for procedure obtained. Time Out: Verified patient identification, verified procedure, site/side was marked, verified correct patient position, special equipment/implants available, medications/allergies/relevent history reviewed, required imaging and test results available.  Performed  Maximum sterile technique was used including antiseptics, cap, gloves, gown, hand hygiene, mask and sheet. Skin prep: Chlorhexidine; local anesthetic administered 20 gauge catheter was inserted into right femoral artery using the Seldinger technique.  Evaluation Blood flow good; BP tracing good. Complications: No apparent complications.  [place] gudiance No radial pulse  Trelon Plush J. 12/19/2011

## 2011-12-19 NOTE — Progress Notes (Signed)
ANTICOAGULATION CONSULT NOTE - Initial Consult  Pharmacy Consult for Heparin Indication: ACS/STEMI   No Known Allergies  Patient Measurements: Height: 5\' 6"  (167.6 cm) Weight: 177 lb (80.287 kg) IBW/kg (Calculated) : 63.8  Heparin Dosing Weight: 79.9kg  Vital Signs: BP: 67/42 mmHg (04/20 1145)  Labs:  Basename 12/19/11 1604 12/19/11 1603 12/19/11 1335 12/19/11 1330 12/19/11 0346 12/18/11 1410  HGB 9.5* -- -- -- 10.5* --  HCT 29.0* -- -- -- 32.3* 43.3  PLT 275 -- -- -- 347 350  APTT -- -- -- -- -- --  LABPROT -- -- -- -- 14.7 --  INR -- -- -- -- 1.13 --  HEPARINUNFRC -- -- -- -- -- --  CREATININE -- 1.32 -- 1.43* 0.94 --  CKTOTAL -- -- 1478* -- 488* --  CKMB -- -- 207.5* -- 13.5* --  TROPONINI -- -- 25.00* -- 0.79* --   Estimated Creatinine Clearance: 70.4 ml/min (by C-G formula based on Cr of 1.32).  Medical History: Past Medical History  Diagnosis Date  . Diabetes mellitus type 1   . Hyperlipidemia   . Hypothyroidism   . Depression   . Umbilical hernia   . Hypertension     Medications:  Scheduled:    . alum & mag hydroxide-simeth  30 mL Oral Once  . aspirin  325 mg Oral Once  . atorvastatin  40 mg Oral q1800  . calcium chloride  IVPB  1 g Intravenous Once  . clopidogrel  300 mg Oral Once  . clopidogrel  75 mg Oral Q breakfast  . dextrose  1 ampule Intravenous Once  . enoxaparin (LOVENOX) injection  30 mg Subcutaneous Q12H  . etomidate      . fentaNYL      . furosemide  40 mg Intravenous Once  . HYDROmorphone      . insulin aspart  5 Units Intravenous Once  . insulin aspart  7 Units Subcutaneous Once  . levothyroxine  200 mcg Oral QAC breakfast  . lidocaine (cardiac) 100 mg/42ml      . magnesium sulfate 1 - 4 g bolus IVPB  2 g Intravenous Once  . midazolam      .  morphine injection  1 mg Intravenous Once  . morphine      . nitroGLYCERIN      . pantoprazole (PROTONIX) IV  40 mg Intravenous QHS  . piperacillin-tazobactam (ZOSYN)  IV  3.375 g  Intravenous Q8H  . rocuronium      . sodium bicarbonate      . sodium chloride  1,000 mL Intravenous Once  . sodium chloride  1,000 mL Intravenous Once  . sodium chloride  3 mL Intravenous Q12H  . sodium polystyrene  30 g Per Tube Once  . succinylcholine      . vancomycin  1,000 mg Intravenous Q8H  . vecuronium      . DISCONTD: buPROPion  150 mg Oral Daily  . DISCONTD: buPROPion  100 mg Oral BID  . DISCONTD: calcium gluconate  1 g Intravenous Once  . DISCONTD: enoxaparin  30 mg Subcutaneous Q24H  . DISCONTD: enoxaparin (LOVENOX) injection  40 mg Subcutaneous Q24H  . DISCONTD: insulin aspart  0-5 Units Subcutaneous QHS  . DISCONTD: insulin aspart  0-9 Units Subcutaneous TID WC  . DISCONTD: insulin glargine  25 Units Subcutaneous QHS  . DISCONTD: insulin regular  5 Units Intravenous Once  . DISCONTD: lisinopril  40 mg Oral Daily  . DISCONTD: metoprolol tartrate  12.5 mg Oral  BID  . DISCONTD: metoprolol tartrate  25 mg Oral BID  . DISCONTD: sodium bicarbonate  50 mEq Intravenous Once   Infusions:    . sodium chloride    . sodium chloride 75 mL (12/19/11 1702)  . fentaNYL infusion INTRAVENOUS 100 mcg/hr (12/19/11 1400)  . insulin (NOVOLIN-R) infusion 6.4 Units/hr (12/19/11 1903)  . midazolam (VERSED) infusion 5 mg/hr (12/19/11 1913)  . norepinephrine (LEVOPHED) Adult infusion 5 mcg/min (12/19/11 1442)  . DISCONTD: fentaNYL infusion INTRAVENOUS    . DISCONTD: lactated ringers    . DISCONTD: lactated ringers    . DISCONTD: midazolam (VERSED) infusion    . DISCONTD: phenylephrine (NEO-SYNEPHRINE) Adult infusion     PRN: fentaNYL, midazolam, ondansetron (ZOFRAN) IV, ondansetron, DISCONTD: 0.9 % irrigation (POUR BTL), DISCONTD: ALPRAZolam, DISCONTD: bupivacaine, DISCONTD: fentaNYL, DISCONTD: HYDROcodone-acetaminophen, DISCONTD: HYDROmorphone, DISCONTD:  HYDROmorphone (DILAUDID) injection, DISCONTD: HYDROmorphone, DISCONTD: methocarbamol (ROBAXIN) IV, DISCONTD: midazolam, DISCONTD:  nitroGLYCERIN, DISCONTD: oxyCODONE-acetaminophen, DISCONTD: promethazine  Assessment: 46 yo male with Dx ACS/ Stemi Goal of Therapy:  Heaprin Goal = 0.3-0.7   Plan:  Discontinue Lovenox  Begin Heparin infusion at 950 Units/hour  Check heparin level 6 hours after starting heparin    Reginald Gutierrez 12/19/2011,7:33 PM

## 2011-12-19 NOTE — Progress Notes (Signed)
PT Cancellation Note  Treatment cancelled today due to medical issues with patient which prohibited therapy and patient receiving procedure or test. Pt had NSTEMI this a.m.  Will hold PT today.  Plan to initiate  Anterior/posterior WC transfers tomorrow if stable from cardiac standpoint. Pt will need WC with elevating leg rests for DC.   Ralene Bathe Kistler 12/19/2011, 9:02 AM [phone]

## 2011-12-19 NOTE — Progress Notes (Signed)
Patient ID: Reginald Gutierrez, male   DOB: [birth year], 46 y.o.   MRN: [id number]  Subjective: Called by nurse, pt is hypotensive and agitated.  Objective:  Vital signs in last 24 hours:  Filed Vitals:   12/19/11 1035 12/19/11 1120 12/19/11 1130 12/19/11 1145  BP: 105/90 72/51 64/35  67/42  Pulse:      Temp:      TempSrc:      Resp:      Height:      Weight:      SpO2:        Intake/Output from previous day:   Intake/Output Summary (Last 24 hours) at 12/19/11 1250 Last data filed at 12/19/11 0700  Gross per 24 hour  Intake   3255 ml  Output   1850 ml  Net   1405 ml    Physical Exam: General: Alert, awake, oriented x3, agitated and pulling lines, follows commands. Skin is cold and clammy HEENT: No bruits, no goiter. Moist mucous membranes, no scleral icterus, no conjunctival pallor. Heart: Regular rate and rhythm, S1/S2 +, no murmurs, rubs, gallops. Lungs: scattered crackles. No wheezing, no rhonchi, no rales.  Abdomen: Soft, nontender, nondistended, positive bowel sounds. Extremities: No clubbing or cyanosis, bilateral lower extremities in ACE wraps, no evident edema Neuro: Pt agitated but follows commands, oriented x 3, no evident focal deficits  Lab Results:  Lab 12/19/11 0346 12/18/11 1410  WBC 15.4* 17.7*  HGB 10.5* 14.5  HCT 32.3* 43.3  PLT 347 350  MCV 89.5 89.1  MCH 29.1 29.8  MCHC 32.5 33.5  RDW 14.5 14.6  LYMPHSABS 1.3 1.0  MONOABS 1.5* 0.6  EOSABS 0.0 0.0  BASOSABS 0.0 0.0  BANDABS -- --    Lab 12/19/11 0346 12/18/11 1410  NA 131* 133*  K 5.5* 4.7  CL 100 101  CO2 23 20  GLUCOSE 343* 238*  BUN 19 20  CREATININE 0.94 0.69  CALCIUM 8.1* 9.3  MG 1.6 --    Lab 12/19/11 0346  INR 1.13  PROTIME --   Cardiac markers:  Lab 12/19/11 0346  CKMB 13.5*  TROPONINI 0.79*  MYOGLOBIN --    Studies/Results:  Dg Tibia/fibula Left 12/18/2011 IMPRESSION: Satisfactory appearance status post ORIF of a distal tibial fracture.    Dg Tibia/fibula  Left 12/18/2011  IMPRESSION: 1.  Distal third left tibia shaft spiral fracture with displacement and angulation as above. 2.  Proximal third left fibula spiral shaft fracture.   Dg Tibia/fibula Right 12/18/2011  IMPRESSION: Satisfactory appearance status post picture ORIF of a distal tibial fracture.   Dg Tibia/fibula Right 12/18/2011   IMPRESSION: Oblique fracture of the proximal fibular diaphysis with lateral displacement of the distal fragment 5 mm.  Spiral fracture of the distal tibial diaphysis with lateral displacement of the distal fragment 7 mm.    Medications: Scheduled Meds:   . alum & mag hydroxide-simeth  30 mL Oral Once  . aspirin  325 mg Oral Once  . atorvastatin  40 mg Oral q1800  . buPROPion  150 mg Oral Daily  . calcium gluconate  1 g Intravenous Once  . clopidogrel  300 mg Oral Once  . clopidogrel  75 mg Oral Q breakfast  . enoxaparin (LOVENOX) injection  30 mg Subcutaneous Q12H  . HYDROmorphone      . insulin aspart  0-5 Units Subcutaneous QHS  . insulin aspart  0-9 Units Subcutaneous TID WC  . insulin aspart  7 Units Subcutaneous Once  . insulin glargine  25  Units Subcutaneous QHS  . levothyroxine  200 mcg Oral QAC breakfast  . metoprolol tartrate  12.5 mg Oral BID  .  morphine injection  1 mg Intravenous Once  . morphine      . nitroGLYCERIN      . sodium chloride  1,000 mL Intravenous Once  . sodium chloride  1,000 mL Intravenous Once  . sodium chloride  3 mL Intravenous Q12H  . DISCONTD: buPROPion  100 mg Oral BID  . DISCONTD: enoxaparin  30 mg Subcutaneous Q24H  . DISCONTD: enoxaparin (LOVENOX) injection  40 mg Subcutaneous Q24H  . DISCONTD: lisinopril  40 mg Oral Daily  . DISCONTD: metoprolol tartrate  25 mg Oral BID   Continuous Infusions:   . sodium chloride 125 mL/hr at 12/19/11 0121  . lactated ringers    . lactated ringers    . phenylephrine (NEO-SYNEPHRINE) Adult infusion     PRN Meds:.ALPRAZolam, HYDROmorphone (DILAUDID) injection,  HYDROmorphone (DILAUDID) injection, HYDROmorphone (DILAUDID) injection, methocarbamol (ROBAXIN) IV, nitroGLYCERIN, ondansetron (ZOFRAN) IV, ondansetron, oxyCODONE-acetaminophen, DISCONTD: 0.9 % irrigation (POUR BTL), DISCONTD: bupivacaine, DISCONTD: HYDROcodone-acetaminophen, DISCONTD: HYDROmorphone, DISCONTD: HYDROmorphone, DISCONTD: promethazine  Assessment/Plan:  Principal Problem:   *Hypotension, SHOCK  - unclear what the exact etiology is at this time - pt has received 2 L bolus of NS on the floor and has not had significant response  - we will transfer the pt to ICU for further evaluation and management - obtain following: blood cultures x 2, lactic acid, random cortisol level, procalcitonin, CXR - start broad spectrum ABX for now: Vancomycin and Zosyn - start phenylephrine drip - per cardiology recommendations continue low dose metoprolol 12.5 mg BID if possible  Active Problems:  Tibial fracture - ortho following   Anemia due to chronic illness - Hg drop from 14 --> 10 - repeat stat CBC - no transfusion at this time   Hyperkalemia - new this AM - discontinue Lisinopril - gave 1 dose of calcium gluconate on the floor - repeat BMP stat - supplement Mg (Mg this AM 1.6)   HYPOTHYROIDISM - TSH pending   DIABETES MELLITUS, TYPE I - A1C pending - ICU hyperglycemia protocol   NSTEMI (non-ST elevated myocardial infarction) - conitnnue Aspirin and Plavix as per cardiology recommendations   EDUCATION - test results and diagnostic studies were discussed with patient and pt's family who was present at the bedside (sister) - patient and family have verbalized the understanding - questions were answered at the bedside and contact information was provided for additional questions or concerns   LOS: 1 day   MAGICK-Helio Lack 12/19/2011, 12:50 PM  [facility] Pager: [phone]

## 2011-12-19 NOTE — Progress Notes (Signed)
ANTIBIOTIC CONSULT NOTE - INITIAL  Pharmacy Consult for Vancomycin/Zosyn Indication: Septic Shock  No Known Allergies  Patient Measurements: Height: 5\' 6"  (167.6 cm) Weight: 177 lb (80.287 kg) IBW/kg (Calculated) : 63.8   Vital Signs: Temp: 98 F (36.7 C) (04/20 0525) Temp src: Oral (04/20 0525) BP: 67/42 mmHg (04/20 1145) Pulse Rate: 128  (04/20 0545) Intake/Output from previous day: 04/19 0701 - 04/20 0700 In: 3255 [I.V.:2200; IV Piggyback:1055] Out: 1850 [Urine:1475; Blood:375] Intake/Output from this shift:    Labs:  [facility] 12/19/11 0346 12/18/11 1410  WBC 15.4* 17.7*  HGB 10.5* 14.5  PLT 347 350  LABCREA -- --  CREATININE 0.94 0.69   Estimated Creatinine Clearance: 98.8 ml/min (by C-G formula based on Cr of 0.94). No results found for this basename: VANCOTROUGH:2,VANCOPEAK:2,VANCORANDOM:2,GENTTROUGH:2,GENTPEAK:2,GENTRANDOM:2,TOBRATROUGH:2,TOBRAPEAK:2,TOBRARND:2,AMIKACINPEAK:2,AMIKACINTROU:2,AMIKACIN:2, in the last 72 hours   Microbiology: No results found for this or any previous visit (from the past 720 hour(s)).  Medical History: Past Medical History  Diagnosis Date  . Diabetes mellitus type 1   . Hyperlipidemia   . Hypothyroidism   . Depression   . Umbilical hernia   . Hypertension     Assessment: 45yof to begin broad spectrum antibiotics with vancomycin and zosyn for septic shock.  CrCl~98.   Goal of Therapy:  Vancomycin trough level 15-20 mcg/ml  Plan:  Zosyn 3.375g IV q8h (4 hour infusion time). Vancomycin 1g IV q8h.  Check VT at steady state. F/u SCr for dose adjustments.  F/u culture results.  Clance Boll 12/19/2011,12:59 PM

## 2011-12-19 NOTE — Progress Notes (Signed)
sbar has beenreviewed by [employer], no questions

## 2011-12-19 NOTE — Progress Notes (Signed)
Was  called by patients mother in the room, patient is pale, with cold ,clammy perspiration,  very restless,agitated, yelling O2 nasal cannula was  on the bed, O2 placed back on but patient removed O2 frequently .patient is communicative, verbally responsive at this time.. Vital signs checked, noted to be hypotensive SBP 71, CBG 231. Dr. Izola Price on the floor and made aware, ordered to give NS 1litter bolus. MD came in to check patient, not responsive to NS bolus still hypotensive, ordered STAT ABG. Called rapid response team, manual BP taken 76/54. Patient suddenly  Passed out and came back responsive and  restless  and a code was called,patient was then transferred  to ICU.

## 2011-12-19 NOTE — Progress Notes (Signed)
*  PRELIMINARY RESULTS* Echocardiogram 2D Echocardiogram has been performed.  Katheren Puller 12/19/2011, 3:25 PM

## 2011-12-19 NOTE — Procedures (Signed)
Intubation Procedure Note Reginald Gutierrez [id number] 07-12-1966  Procedure: Intubation Indications: Respiratory insufficiency  Procedure Details Consent: Risks of procedure as well as the alternatives and risks of each were explained to the (patient/caregiver).  Consent for procedure obtained. Time Out: Verified patient identification, verified procedure, site/side was marked, verified correct patient position, special equipment/implants available, medications/allergies/relevent history reviewed, required imaging and test results available.  Performed  Maximum sterile technique was used including gloves and gown.  MAC    Evaluation Hemodynamic Status: Transient hypertension requiring treatment; O2 sats: transiently fell during during procedure Patient's Current Condition: stable Complications: No apparent complications Patient did tolerate procedure well. Chest X-ray ordered to verify placement.  CXR: wnl ett  Small airway orifice, mild anterior airway   [name]. 12/19/2011  Mcarthur Rossetti. Tyson Alias, MD, FACP Pgr: [phone] [facility]

## 2011-12-19 NOTE — Progress Notes (Signed)
On or about 0525 am, patient complained of severe mid chest pain described as crushing pain, pain level 10/10. Reginald Coons NP notified, seen and examined the patient. EKG done, Nitroglycerine SL x3 given with some relief. BP stable, HR at 120's-130's sinus tach. Reginald Gutierrez made new orders. Awaiting to be seen by cardiologist. Will continue to monitor patient.

## 2011-12-19 NOTE — Procedures (Signed)
Central Venous Catheter Insertion Procedure Note Reginald Gutierrez [id number] 01-05-1966  Procedure: Insertion of Central Venous Catheter Indications: Drug and/or fluid administration  Procedure Details Consent: Risks of procedure as well as the alternatives and risks of each were explained to the (patient/caregiver).  Consent for procedure obtained. Time Out: Verified patient identification, verified procedure, site/side was marked, verified correct patient position, special equipment/implants available, medications/allergies/relevent history reviewed, required imaging and test results available.  Performed  Maximum sterile technique was used including antiseptics, cap, gloves, gown, hand hygiene, mask and sheet. Skin prep: Chlorhexidine; local anesthetic administered A antimicrobial bonded/coated triple lumen catheter was placed in the right internal jugular vein using the Seldinger technique.  Evaluation Blood flow good Complications: No apparent complications Patient did tolerate procedure well. Chest X-ray ordered to verify placement.  CXR: normal  [place] gudiance.  Reginald Gutierrez 12/19/2011, 2:29 PM

## 2011-12-19 NOTE — Progress Notes (Signed)
eLink Physician-Brief Progress Note Patient Name: Reginald Gutierrez DOB: 12/06/1965 MRN: [id number]  Date of Service  12/19/2011   HPI/Events of Note     eICU Interventions  CPOE for verbal orders given by dr Tyson Alias   Intervention Category Minor Interventions: Communication with other healthcare providers and/or family  Reginald Gutierrez V. 12/19/2011, 3:38 PM

## 2011-12-19 NOTE — Progress Notes (Signed)
Subjective: 1 Day Post-Op Procedure(s) (LRB): INTRAMEDULLARY (IM) NAIL TIBIAL (Bilateral) Patient reports pain as moderate pain with his chest and some pain with both legs.   Patient seen in rounds with Dr. Lequita Halt. Patient has complaints of chest pain and being worked up.  Both legs with some pain.  Both elevated on pillows Being seen by Dr. Patty Sermons. NSTEMI (non-ST elevated myocardial infarction) (12/19/2011) Assessment: Still tachycardic Plan: Beta blocker, ASA,Plavix, ACEi ,statin, SL NTG   Objective: Vital signs in last 24 hours: Temp:  [97.4 F (36.3 C)-98.5 F (36.9 C)] 98 F (36.7 C) (04/20 0525) Pulse Rate:  [96-130] 127  (04/20 0535) Resp:  [10-22] 18  (04/20 0535) BP: (113-158)/(52-79) 119/64 mmHg (04/20 0535) SpO2:  [88 %-99 %] 88 % (04/20 0535) Weight:  [80.287 kg (177 lb)] 80.287 kg (177 lb) (04/20 0120)  Intake/Output from previous day:  Intake/Output Summary (Last 24 hours) at 12/19/11 0832 Last data filed at 12/19/11 0700  Gross per 24 hour  Intake   3255 ml  Output   1850 ml  Net   1405 ml    Intake/Output this shift:    Labs:  Basename 12/19/11 0346 12/18/11 1410  HGB 10.5* 14.5    Basename 12/19/11 0346 12/18/11 1410  WBC 15.4* 17.7*  RBC 3.61* 4.86  HCT 32.3* 43.3  PLT 347 350    Basename 12/19/11 0346 12/18/11 1410  NA 131* 133*  K 5.5* 4.7  CL 100 101  CO2 23 20  BUN 19 20  CREATININE 0.94 0.69  GLUCOSE 343* 238*  CALCIUM 8.1* 9.3    Basename 12/19/11 0346  LABPT --  INR 1.13    Exam - Neurovascular intact Sensation intact distally to both legs Dressing - clean, dry, no drainage to both lower legs Motor function intact - moving feet and toes well on exam.   Past Medical History  Diagnosis Date  . Diabetes mellitus type 1   . Hyperlipidemia   . Hypothyroidism   . Depression   . Umbilical hernia   . Hypertension     Assessment/Plan: 1 Day Post-Op Procedure(s) (LRB): INTRAMEDULLARY (IM) NAIL TIBIAL  (Bilateral) Principal Problem:  *Tibial fracture Active Problems:  HYPOTHYROIDISM  DIABETES MELLITUS, TYPE I  HYPERCHOLESTEROLEMIA  DEPRESSION  Syncope and collapse  NSTEMI (non-ST elevated myocardial infarction)  DVT Prophylaxis - Lovenox and Plavix per Cards. Non Weight-Bearing as tolerated to both legs. Wheelchair and Transfers  Reginald Gutierrez 12/19/2011, 8:32 AM

## 2011-12-19 NOTE — Progress Notes (Signed)
CRITICAL VALUE ALERT  Critical value received:  CK,MB 13.5, Troponin 0.79  Date of notification:  12/19/2011  Time of notification:  0440  Critical value read back:yes  Nurse who received alert:  L. Vergel de Lucy Chris RN  MD notified (1st page):  Benedetto Coons NP  Time of first page:  [phone]  MD notified (2nd page):  Time of second page:  Responding MD:  Benedetto Coons NP  Time MD responded:  [phone]

## 2011-12-20 ENCOUNTER — Inpatient Hospital Stay (HOSPITAL_COMMUNITY): Payer: Medicare Other

## 2011-12-20 DIAGNOSIS — J690 Pneumonitis due to inhalation of food and vomit: Secondary | ICD-10-CM | POA: Diagnosis present

## 2011-12-20 DIAGNOSIS — E162 Hypoglycemia, unspecified: Secondary | ICD-10-CM

## 2011-12-20 DIAGNOSIS — I214 Non-ST elevation (NSTEMI) myocardial infarction: Secondary | ICD-10-CM

## 2011-12-20 DIAGNOSIS — R579 Shock, unspecified: Secondary | ICD-10-CM | POA: Diagnosis present

## 2011-12-20 DIAGNOSIS — J9600 Acute respiratory failure, unspecified whether with hypoxia or hypercapnia: Secondary | ICD-10-CM | POA: Diagnosis present

## 2011-12-20 LAB — COMPREHENSIVE METABOLIC PANEL
Albumin: 2.2 g/dL — ABNORMAL LOW (ref 3.5–5.2)
BUN: 16 mg/dL (ref 6–23)
Calcium: 7.8 mg/dL — ABNORMAL LOW (ref 8.4–10.5)
Creatinine, Ser: 0.99 mg/dL (ref 0.50–1.35)
GFR calc Af Amer: >90 mL/min (ref >90)
Glucose, Bld: 157 mg/dL — ABNORMAL HIGH (ref 70–99)
Total Protein: 4.6 g/dL — ABNORMAL LOW (ref 6.0–8.3)

## 2011-12-20 LAB — GLUCOSE, CAPILLARY
Glucose-Capillary: 293 mg/dL — ABNORMAL HIGH (ref 70–99)
Glucose-Capillary: 149 mg/dL — ABNORMAL HIGH (ref 70–99)
Glucose-Capillary: 157 mg/dL — ABNORMAL HIGH (ref 70–99)
Glucose-Capillary: 170 mg/dL — ABNORMAL HIGH (ref 70–99)
Glucose-Capillary: 202 mg/dL — ABNORMAL HIGH (ref 70–99)
Glucose-Capillary: 255 mg/dL — ABNORMAL HIGH (ref 70–99)
Glucose-Capillary: 120 mg/dL — ABNORMAL HIGH (ref 70–99)
Glucose-Capillary: 171 mg/dL — ABNORMAL HIGH (ref 70–99)
Glucose-Capillary: 227 mg/dL — ABNORMAL HIGH (ref 70–99)

## 2011-12-20 LAB — CBC
HCT: 24.4 % — ABNORMAL LOW (ref 39.0–52.0)
Hemoglobin: 8.1 g/dL — ABNORMAL LOW (ref 13.0–17.0)
Hemoglobin: 8.6 g/dL — ABNORMAL LOW (ref 13.0–17.0)
Hemoglobin: 8.1 g/dL — ABNORMAL LOW (ref 13.0–17.0)
MCH: 29.2 pg (ref 26.0–34.0)
MCHC: 34.1 g/dL (ref 30.0–36.0)
MCHC: 33.3 g/dL (ref 30.0–36.0)
MCHC: 32.7 g/dL (ref 30.0–36.0)
MCHC: 33.2 g/dL (ref 30.0–36.0)
MCV: 86.4 fL (ref 78.0–100.0)
MCV: 89.7 fL (ref 78.0–100.0)
Platelets: 277 10*3/uL (ref 150–400)
Platelets: 253 10*3/uL (ref 150–400)
Platelets: 241 10*3/uL (ref 150–400)
Platelets: 272 10*3/uL (ref 150–400)
Platelets: 212 10*3/uL (ref 150–400)
RBC: 2.95 MIL/uL — ABNORMAL LOW (ref 4.22–5.81)
RBC: 3.01 MIL/uL — ABNORMAL LOW (ref 4.22–5.81)
RBC: 2.73 MIL/uL — ABNORMAL LOW (ref 4.22–5.81)
RDW: 14.4 % (ref 11.5–15.5)
RDW: 14.9 % (ref 11.5–15.5)
RDW: 14.8 % (ref 11.5–15.5)
RDW: 15 % (ref 11.5–15.5)
WBC: 9.9 10*3/uL (ref 4.0–10.5)
WBC: 10.1 10*3/uL (ref 4.0–10.5)
WBC: 10.9 10*3/uL — ABNORMAL HIGH (ref 4.0–10.5)

## 2011-12-20 LAB — BASIC METABOLIC PANEL
BUN: 17 mg/dL (ref 6–23)
BUN: 15 mg/dL (ref 6–23)
BUN: 15 mg/dL (ref 6–23)
BUN: 17 mg/dL (ref 6–23)
CO2: 22 mEq/L (ref 19–32)
Calcium: 7.5 mg/dL — ABNORMAL LOW (ref 8.4–10.5)
Calcium: 7.6 mg/dL — ABNORMAL LOW (ref 8.4–10.5)
Calcium: 7.7 mg/dL — ABNORMAL LOW (ref 8.4–10.5)
Calcium: 7.6 mg/dL — ABNORMAL LOW (ref 8.4–10.5)
Chloride: 104 mEq/L (ref 96–112)
Creatinine, Ser: 1 mg/dL (ref 0.50–1.35)
Creatinine, Ser: 0.99 mg/dL (ref 0.50–1.35)
Creatinine, Ser: 1.14 mg/dL (ref 0.50–1.35)
GFR calc Af Amer: >90 mL/min (ref >90)
GFR calc Af Amer: >90 mL/min (ref >90)
GFR calc Af Amer: >90 mL/min (ref >90)
GFR calc non Af Amer: 89 mL/min — ABNORMAL LOW (ref >90)
GFR calc non Af Amer: 76 mL/min — ABNORMAL LOW (ref >90)
GFR calc non Af Amer: 83 mL/min — ABNORMAL LOW (ref >90)
Glucose, Bld: 198 mg/dL — ABNORMAL HIGH (ref 70–99)
Glucose, Bld: 224 mg/dL — ABNORMAL HIGH (ref 70–99)
Glucose, Bld: 165 mg/dL — ABNORMAL HIGH (ref 70–99)
Potassium: 3.3 mEq/L — ABNORMAL LOW (ref 3.5–5.1)
Potassium: 4 mEq/L (ref 3.5–5.1)
Sodium: 136 mEq/L (ref 135–145)
Sodium: 138 mEq/L (ref 135–145)
Sodium: 139 mEq/L (ref 135–145)

## 2011-12-20 LAB — BLOOD GAS, ARTERIAL
Acid-base deficit: 2.2 mmol/L — ABNORMAL HIGH (ref 0.0–2.0)
Acid-base deficit: 2.4 mmol/L — ABNORMAL HIGH (ref 0.0–2.0)
FIO2: 0.4 %
MECHVT: 500 mL
MECHVT: 500 mL
O2 Saturation: 99.3 %
Patient temperature: 37
Patient temperature: 37
TCO2: 18.5 mmol/L (ref 0–100)
TCO2: 19.8 mmol/L (ref 0–100)
pCO2 arterial: 26.8 mmHg — ABNORMAL LOW (ref 35.0–45.0)
pH, Arterial: 7.432 (ref 7.350–7.450)

## 2011-12-20 LAB — T4, FREE: Free T4: 1.63 ng/dL (ref 0.80–1.80)

## 2011-12-20 LAB — DIFFERENTIAL
Basophils Absolute: 0 10*3/uL (ref 0.0–0.1)
Lymphocytes Relative: 17 % (ref 12–46)
Neutro Abs: 7.4 10*3/uL (ref 1.7–7.7)

## 2011-12-20 LAB — HEPARIN LEVEL (UNFRACTIONATED)
Heparin Unfractionated: 0.11 IU/mL — ABNORMAL LOW (ref 0.30–0.70)
Heparin Unfractionated: 0.19 IU/mL — ABNORMAL LOW (ref 0.30–0.70)

## 2011-12-20 LAB — PROCALCITONIN: Procalcitonin: 7.2 ng/mL

## 2011-12-20 LAB — LIPID PANEL
HDL: 35 mg/dL — ABNORMAL LOW (ref >39)
LDL Cholesterol: 23 mg/dL (ref 0–99)
Total CHOL/HDL Ratio: 2.2 RATIO

## 2011-12-20 LAB — CARDIAC PANEL(CRET KIN+CKTOT+MB+TROPI)
CK, MB: 48.2 ng/mL (ref 0.3–4.0)
Relative Index: 5 — ABNORMAL HIGH (ref 0.0–2.5)
Total CK: 972 U/L — ABNORMAL HIGH (ref 7–232)
Troponin I: >25 ng/mL (ref <0.30)

## 2011-12-20 LAB — PROTIME-INR: Prothrombin Time: 18.1 seconds — ABNORMAL HIGH (ref 11.6–15.2)

## 2011-12-20 LAB — APTT: aPTT: 48 seconds — ABNORMAL HIGH (ref 24–37)

## 2011-12-20 MED ORDER — HEPARIN (PORCINE) IN NACL 100-0.45 UNIT/ML-% IJ SOLN
1600.0000 [IU]/h | INTRAMUSCULAR | Status: DC
  Administered 2011-12-20: 1450 [IU]/h via INTRAVENOUS
  Filled 2011-12-20 (×5): qty 250

## 2011-12-20 MED ORDER — JEVITY 1.2 CAL PO LIQD
1000.0000 mL | ORAL | Status: DC
  Administered 2011-12-20 – 2011-12-21 (×2): 1000 mL
  Administered 2011-12-22: 03:00:00

## 2011-12-20 MED ORDER — POTASSIUM CHLORIDE 20 MEQ/15ML (10%) PO LIQD
40.0000 meq | Freq: Once | ORAL | Status: AC
  Administered 2011-12-20: 40 meq
  Filled 2011-12-20: qty 30

## 2011-12-20 MED ORDER — IOHEXOL 300 MG/ML  SOLN: 100.0000 mL | Freq: Once | INTRAMUSCULAR | Status: AC | PRN

## 2011-12-20 MED ORDER — METOPROLOL TARTRATE 1 MG/ML IV SOLN
2.5000 mg | Freq: Four times a day (QID) | INTRAVENOUS | Status: DC
  Administered 2011-12-20 – 2011-12-22 (×8): 2.5 mg via INTRAVENOUS
  Filled 2011-12-20 (×16): qty 5

## 2011-12-20 MED ORDER — IOHEXOL 300 MG/ML  SOLN
100.0000 mL | Freq: Once | INTRAMUSCULAR | Status: AC | PRN
  Administered 2011-12-20: 100 mL via INTRAVENOUS

## 2011-12-20 MED ORDER — BIOTENE DRY MOUTH MT LIQD
15.0000 mL | Freq: Four times a day (QID) | OROMUCOSAL | Status: DC
  Administered 2011-12-20 – 2011-12-22 (×9): 15 mL via OROMUCOSAL

## 2011-12-20 MED ORDER — JEVITY 1.2 CAL PO LIQD: 1000.0000 mL | ORAL | Status: DC

## 2011-12-20 MED ORDER — CHLORHEXIDINE GLUCONATE 0.12 % MT SOLN
15.0000 mL | Freq: Two times a day (BID) | OROMUCOSAL | Status: DC
  Administered 2011-12-20 – 2012-01-07 (×37): 15 mL via OROMUCOSAL
  Filled 2011-12-20 (×37): qty 15

## 2011-12-20 NOTE — Progress Notes (Addendum)
Subjective:  The patient is sedated on vent. Rhythm is sinus tachycardia.  Had runs of VT prior to midnight, none since.  Not yet on beta blocker because of low BP EKG this am shows sinus tachycardia.  Previous ST depression has resolved.  No acute changes. Hgb has stabilized.  Ortho has found no complications from the heparin so far. Chest xray shows normal heart size with patchy bilateral infiltrates. Patient will get a CT angio of chest with contrast later today.  Objective:  Vital Signs in the last 24 hours: Temp:  [97.4 F (36.3 C)-103.1 F (39.5 C)] 102.3 F (39.1 C) (04/21 0806) Pulse Rate:  [74-125] 74  (04/21 0700) Resp:  [15-35] 32  (04/21 0700) BP: (64-130)/(35-90) 128/50 mmHg (04/21 0318) SpO2:  [85 %-100 %] 85 % (04/21 0700) Arterial Line BP: (113-142)/(66-97) 113/66 mmHg (04/20 1430) FiO2 (%):  [40 %-100 %] 40 % (04/21 0806) Weight:  [80.287 kg (177 lb)] 80.287 kg (177 lb) (04/20 1310)  Intake/Output from previous day: 04/20 0701 - 04/21 0700 In: 2478.5 [I.V.:1618.5; NG/GT:125; IV Piggyback:735] Out: 1760 [Urine:1760] Intake/Output from this shift: Total I/O In: 12.5 [IV Piggyback:12.5] Out: -      . antiseptic oral rinse  15 mL Mouth Rinse QID  . atorvastatin  40 mg Oral q1800  . calcium chloride  IVPB  1 g Intravenous Once  . chlorhexidine  15 mL Mouth Rinse BID  . clopidogrel  75 mg Oral Q breakfast  . dextrose  1 ampule Intravenous Once  . etomidate      . feeding supplement (JEVITY 1.2 CAL)  1,000 mL Per Tube Q24H  . fentaNYL      . HYDROmorphone      . insulin aspart  5 Units Intravenous Once  . lidocaine (cardiac) 100 mg/70ml      . magnesium sulfate 1 - 4 g bolus IVPB  2 g Intravenous Once  . midazolam      . pantoprazole (PROTONIX) IV  40 mg Intravenous QHS  . piperacillin-tazobactam (ZOSYN)  IV  3.375 g Intravenous Q8H  . potassium chloride  40 mEq Per Tube Once  . rocuronium      . sodium bicarbonate      . sodium chloride  1,000 mL  Intravenous Once  . sodium chloride  1,000 mL Intravenous Once  . sodium chloride  3 mL Intravenous Q12H  . sodium polystyrene  30 g Per Tube Once  . succinylcholine      . vancomycin  1,000 mg Intravenous Q8H  . vecuronium      . DISCONTD: buPROPion  150 mg Oral Daily  . DISCONTD: calcium gluconate  1 g Intravenous Once  . DISCONTD: enoxaparin (LOVENOX) injection  30 mg Subcutaneous Q12H  . DISCONTD: feeding supplement (JEVITY 1.2 CAL)  1,000 mL Per Tube Q24H  . DISCONTD: furosemide  40 mg Intravenous Once  . DISCONTD: insulin aspart  0-5 Units Subcutaneous QHS  . DISCONTD: insulin aspart  0-9 Units Subcutaneous TID WC  . DISCONTD: insulin glargine  25 Units Subcutaneous QHS  . DISCONTD: insulin regular  5 Units Intravenous Once  . DISCONTD: levothyroxine  200 mcg Oral QAC breakfast  . DISCONTD: lisinopril  40 mg Oral Daily  . DISCONTD: metoprolol tartrate  12.5 mg Oral BID  . DISCONTD: metoprolol tartrate  25 mg Oral BID  . DISCONTD: sodium bicarbonate  50 mEq Intravenous Once      . sodium chloride    . fentaNYL infusion INTRAVENOUS  150 mcg/hr (12/20/11 0700)  . heparin 15 Units/kg/hr (12/20/11 0700)  . insulin (NOVOLIN-R) infusion 2 Units/hr (12/20/11 0741)  . midazolam (VERSED) infusion 5 mg/hr (12/20/11 0700)  . norepinephrine (LEVOPHED) Adult infusion 5 mcg/min (12/19/11 1442)  . DISCONTD: sodium chloride 75 mL (12/19/11 1702)  . DISCONTD: fentaNYL infusion INTRAVENOUS    . DISCONTD: lactated ringers    . DISCONTD: lactated ringers    . DISCONTD: midazolam (VERSED) infusion    . DISCONTD: phenylephrine (NEO-SYNEPHRINE) Adult infusion      Physical Exam: The patient appears to be in no distress, on ventilator, sedated.   Chest is clear anteriorly.  No rales or rhonchi.  Expansion of the chest is symmetrical.  Heart reveals no abnormal lift or heave.  First and second heart sounds are normal.  There is no murmur gallop rub or click.  The abdomen is soft and no  apparent tenderness. Bowel sounds are normoactive.  There is no hepatosplenomegaly or mass.  There are no abdominal bruits.  Extremities have ace wraps bilat.  Toes are warm and well-perfused.  Neurologic exam: Sedated on vent.  Integument reveals no rash  Lab Results:  Basename 12/20/11 0430 12/20/11 0100  WBC 10.1 9.9  HGB 10.8* 8.9*  PLT 253 277    Basename 12/20/11 0610 12/20/11 0100  NA 137 136  K 3.5 3.3*  CL 107 104  CO2 22 22  GLUCOSE 157* 198*  BUN 16 17  CREATININE 0.99 1.05    Basename 12/20/11 0610 12/19/11 2040  TROPONINI >25.00* >25.00*   Hepatic Function Panel  Basename 12/20/11 0610  PROT 4.6*  ALBUMIN 2.2*  AST 212*  ALT 83*  ALKPHOS 88  BILITOT 0.9  BILIDIR --  IBILI --   No results found for this basename: CHOL in the last 72 hours No results found for this basename: PROTIME in the last 72 hours  Imaging: Dg Tibia/fibula Left  12/18/2011  *RADIOLOGY REPORT*  Clinical Data: Postop IM nail tib-fib fracture  LEFT TIBIA AND FIBULA - 2 VIEW  Comparison: 12/18/2011  Findings: IM nail with two proximal and two distal interlocking screws transfixing an oblique distal tibial fracture.  The fracture fragments are now in near anatomic alignment and position.  Possible fibular fracture is not imaged.  IMPRESSION: Satisfactory appearance status post ORIF of a distal tibial fracture.  Original Report Authenticated By: Charline Bills, M.D.   Dg Tibia/fibula Left  12/18/2011  *RADIOLOGY REPORT*  Clinical Data: 46 year old male with possible fall.  Lower extremity deformity.  Hypoglycemia.  LEFT TIBIA AND FIBULA - 2 VIEW  Comparison: None.  Findings: Spiral fracture of the distal third left tibia shaft. Distal fragment posteriorly displaced by one half shaft with, laterally displaced by one half shaft with, and anteriorly angulated.  Spiral fracture of the left fibula proximal third shaft.  Lateral displacement of nearly one full shaft with.  Anterior displacement  of less than one full shaft width and posterior angulation.  Grossly normal alignment at the left knee and ankle.  IMPRESSION: 1.  Distal third left tibia shaft spiral fracture with displacement and angulation as above. 2.  Proximal third left fibula spiral shaft fracture.  Original Report Authenticated By: Harley Hallmark, M.D.   Dg Tibia/fibula Right  12/18/2011  *RADIOLOGY REPORT*  Clinical Data: Postop IM nail of tib-fib fracture  RIGHT TIBIA AND FIBULA - 2 VIEW  Comparison: 12/18/2011  Findings: IM nail with two proximal and two distal interlocking screws transfixing an oblique/spiral distal tibial  fracture.  The fracture fragments are in near anatomic alignment and position.  No proximal fibular fracture is not well visualized.  IMPRESSION: Satisfactory appearance status post picture ORIF of a distal tibial fracture.  Original Report Authenticated By: Charline Bills, M.D.   Dg Tibia/fibula Right  12/18/2011  *RADIOLOGY REPORT*  Clinical Data: Hypoglycemia.  Fall.  Pain and deformity.  RIGHT TIBIA AND FIBULA - 2 VIEW  Comparison: None.  Findings: There is a spiral fracture of the distal tibial diaphysis with lateral displacement of the distal fragment by 8 mm.  There is an oblique fracture of the proximal diaphysis of the fibula. Distal fragment is displaced laterally 5 mm.  IMPRESSION: Oblique fracture of the proximal fibular diaphysis with lateral displacement of the distal fragment 5 mm.  Spiral fracture of the distal tibial diaphysis with lateral displacement of the distal fragment 7 mm.  Original Report Authenticated By: Thomasenia Sales, M.D.   Ct Head Wo Contrast  12/19/2011  *RADIOLOGY REPORT*  Clinical Data: Altered mental status, postop  CT HEAD WITHOUT CONTRAST  Technique:  Contiguous axial images were obtained from the base of the skull through the vertex without contrast.  Comparison: None.  Findings: No evidence of parenchymal hemorrhage or extra-axial fluid collection. No mass lesion,  mass effect, or midline shift.  No CT evidence of acute infarction.  Intracranial atherosclerosis.  Cerebral volume is age appropriate.  No ventriculomegaly.  The visualized paranasal sinuses are essentially clear. The mastoid air cells are unopacified.  No evidence of calvarial fracture.  IMPRESSION: No evidence of acute intracranial abnormality.  Intracranial atherosclerosis.  Original Report Authenticated By: Charline Bills, M.D.   Ct Chest Wo Contrast  12/19/2011  *RADIOLOGY REPORT*  Clinical Data: Postop, shortness of breath, hypoxia, confusion  CT CHEST WITHOUT CONTRAST  Technique:  Multidetector CT imaging of the chest was performed following the standard protocol without IV contrast.  Comparison: Chest radiographs dated 12/19/2011  Findings: Pulmonary arteries cannot be assessed in the absence of intravenous contrast.  Multifocal patchy opacities in the posterior upper lobes and bilateral lower lobes, right greater than left, suspicious for multifocal pneumonia, likely on the basis of aspiration.  Small bilateral pleural effusions.  No pneumothorax.  Endotracheal tube terminates 1.5 cm above the carina.  The heart is top normal in size.  No pericardial effusion. Coronary atherosclerosis right IJ venous catheter terminates in the upper SVC.  No suspicious mediastinal, hilar, or axillary lymphadenopathy.  Visualized upper abdomen is notable for an enteric tube coursing into the stomach.  Mild degenerative changes of the visualized thoracolumbar spine.  IMPRESSION: Multifocal patchy opacities, suspicious for multifocal pneumonia, likely on the basis of aspiration.  Small bilateral pleural effusions.  Pulmonary arteries cannot be assessed in the absence of intravenous contrast.  Age advanced coronary atherosclerosis.  Support apparatus as above.  Original Report Authenticated By: Charline Bills, M.D.   Dg Chest Port 1 View  12/20/2011  *RADIOLOGY REPORT*  Clinical Data: Check endotracheal tube.   PORTABLE CHEST - 1 VIEW  Comparison: 12/19/2011 CT and chest x-ray  Findings: Endotracheal tube is in place, 2.5 cm above carina. Right IJ central line tip overlies the level of superior vena cava. Nasogastric tube is in place, tip off the film but beyond the level of the gastroesophageal junction.  The heart size is normal.  There is patchy infiltrate involving the right lower lobe.  Mild pulmonary infiltrates are again identified, showing little interval change accounting for differences in technique.  IMPRESSION:  1.  Little interval change. 2.  Persistent bilateral infiltrates.  Original Report Authenticated By: Patterson Hammersmith, M.D.   Dg Chest Port 1 View  12/19/2011  *RADIOLOGY REPORT*  Clinical Data: Central line and ET tube placement.  PORTABLE CHEST - 1 VIEW  Comparison: Chest 12/19/2011 at 12:47 p.m.  Findings: The patient has a new endotracheal tube in place with the tip 2.2 cm above the carina.  Right IJ catheter tip is in the lower superior vena cava.  No pneumothorax.  There has been interval worsening of right greater than left airspace disease. Cardiomegaly.  IMPRESSION:  1.  ET tube tip is 2.2 cm above the carina. 2.  Right IJ catheter in good position.  No pneumothorax. 3.  Some progression of bilateral airspace disease.  Original Report Authenticated By: Bernadene Bell. Maricela Curet, M.D.   Dg Chest Port 1v Same Day  12/19/2011  *RADIOLOGY REPORT*  Clinical Data: Hypertension.  Bilateral tib-fib fractures last night.  PORTABLE CHEST - 1 VIEW SAME DAY  Comparison: Plain film chest 09/13/2009.  Findings: The patient has asymmetric airspace disease, much greater on the right.  Heart size is upper normal.  No pneumothorax or effusion.  IMPRESSION: Right much worse than left airspace disease could be due to asymmetric edema, fat emboli or possibly pneumonia.  Original Report Authenticated By: Bernadene Bell. Maricela Curet, M.D.    Cardiac Studies: Study Conclusions 2D echo  - Left ventricle: The cavity  size was normal. Wall thickness was normal. Systolic function was severely reduced. The estimated ejection fraction was in the range of 25% to 30%. There is akinesis of the mid-distalanteroseptal and apical myocardium. Left ventricular diastolic function parameters were normal. - Mitral valve: Mild regurgitation. - Right ventricle: Systolic function was mildly reduced. Transthoracic echocardiography. M-mode, complete 2D,   Assessment/Plan:  Patient Active Hospital Problem List:     Tibial fracture (12/18/2011)   Assessment: Stable after surgery.  Per ortho no evidence of compartment syndrome.   Plan: Continue IV heparin NSTEMI (non-ST elevated myocardial infarction) (12/19/2011)   Assessment: EKG improved. BP improved.  Still tachycardic.  Runs of VT last pm.   Plan: Add low dose metoprolol with parameters. Eventual cardiac cath before going home but no hurry.   LOS: 2 days    Cassell Clement 12/20/2011, 9:50 AM

## 2011-12-20 NOTE — Progress Notes (Signed)
ANTICOAGULATION CONSULT NOTE - Follow Up Consult  Pharmacy Consult for Heparin Indication: ACS/STEMI   No Known Allergies  Patient Measurements: Height: 5\' 6"  (167.6 cm) Weight: 177 lb (80.287 kg) IBW/kg (Calculated) : 63.8  Heparin Dosing Weight:   Vital Signs: Temp: 101.6 F (38.7 C) (04/21 0400) Temp src: Oral (04/21 0400) BP: 128/50 mmHg (04/21 0318) Pulse Rate: 119  (04/21 0400)  Labs:  Basename 12/20/11 0430 12/20/11 0100 12/19/11 2040 12/19/11 2030 12/19/11 1603 12/19/11 1335 12/19/11 0346  HGB 10.8* 8.9* -- -- -- -- --  HCT 32.4* 26.1* -- 28.2* -- -- --  PLT 253 277 -- 275 -- -- --  APTT 48* -- 29 -- -- -- --  LABPROT 18.1* -- 17.4* -- -- -- 14.7  INR 1.47 -- 1.40 -- -- -- 1.13  HEPARINUNFRC 0.11* -- -- -- -- -- --  CREATININE -- 1.05 -- 1.08 1.32 -- --  CKTOTAL -- -- 1658* -- -- 1478* 488*  CKMB -- -- 261.2* -- -- 207.5* 13.5*  TROPONINI -- -- >25.00* -- -- >25.00* 0.79*   Estimated Creatinine Clearance: 88.5 ml/min (by C-G formula based on Cr of 1.05).   Medications:  Infusions:    . sodium chloride    . sodium chloride 75 mL (12/19/11 1702)  . fentaNYL infusion INTRAVENOUS 150 mcg/hr (12/20/11 0400)  . heparin 11.831 Units/kg/hr (12/20/11 0400)  . insulin (NOVOLIN-R) infusion 0.7 Units/hr (12/20/11 0524)  . midazolam (VERSED) infusion 5 mg/hr (12/20/11 0400)  . norepinephrine (LEVOPHED) Adult infusion 5 mcg/min (12/19/11 1442)  . DISCONTD: fentaNYL infusion INTRAVENOUS    . DISCONTD: lactated ringers    . DISCONTD: lactated ringers    . DISCONTD: midazolam (VERSED) infusion    . DISCONTD: phenylephrine (NEO-SYNEPHRINE) Adult infusion      Assessment: Patient with low heparin level.  No issues with drip per RN.  Goal of Therapy:  Heparin level 0.3-0.7 units/ml   Plan:  Increase heparin drip to 1200 units/hr, recheck level at [address] [place] 12/20/2011,6:10 AM

## 2011-12-20 NOTE — Progress Notes (Signed)
Patient was coding. The Code was continuing when Chaplain arrived. Several staff, along with the patient's sister were present. The patient's sister, Fritzi Mandes, is a Engineer, civil (consulting) at [employer] on the 5th floor. The Chaplain saw the patient's mother first. While she was on the phone, he went in to see what was going on and bring her back an update. Chaplain returned from the code in room 1225 (it had begun in room 1408 and the patient was moved) to the mother in the waiting area. We sat and chatted for along time, discussing the patient's past, his medical history, his drug history, prior dangerous events and violence in his life, and discussed what they mean for the present. We talked about how he has been taken care of for years, apparently, despite what we might view on the surface of the events of his life. We discussed the possibility of death.  Eventually, Dr. Tyson Alias came out to talk to the patient's mother. He gave her a very straightforward picture of her son with his daughter present. When the doctor returned to the patient, Lunette Stands and daughter reviewed what the doctor had told her. We spoke of the good and the bad the doctor related, and helped her process what the doctor had been attempting to convey. The mother demonstrates some forgetfulness, repeating questions, forgetting she has written something down, etc. It is necessary to review various points of import for her.   While the code had ended, there was a constant flow of treatment and test being administered. Dr. Tyson Alias cam eout agian to let the famil;y know the patient had a heart attack and his stress reading was off the charts, along with other "wrap-up" information he didn't have the first time. He shared his interventions and course of action freely with the family. The mother is in a process of accepting the seriousness and reality of what her son has cultivated in himself by his actions and choices (e.g. drugs, poor diet for a life time diabetic,  etc.) She goes back and forth between a gloomy outcome and a hopeful one.   The Chaplain also spoke privately to the daughter (age 46) who is a single mom, with a current serious boyfriend, 4 years removed from her not so amicable divorce. He shared the viability of funeral arrangements, which her mother is not yet ready for at all, and the confusion and memory interruptions he has noticed with her mom. He also told her he'd return tomorrow, but that should not limit her asking for the Chaplain anytime she felt one was needed. He also said he was available for her as well as her mom. Chaplain also mentioned looping her 54 year old daughter into the mix, which she said she will when she gets back from out of town.  Rema Jasmine, Chaplain Group Pager: [phone] Local Pager:  [phone]

## 2011-12-20 NOTE — Progress Notes (Signed)
Subjective: 2 Days Post-Op Procedure(s) (LRB): INTRAMEDULLARY (IM) NAIL TIBIAL (Bilateral) Patient intubated and sedated.  Family member in room. Patient seen in rounds with Dr. Lequita Halt.  Objective: Vital signs in last 24 hours: Temp:  [97.4 F (36.3 C)-103.1 F (39.5 C)] 102.3 F (39.1 C) (04/21 0806) Pulse Rate:  [74-125] 74  (04/21 0700) Resp:  [15-35] 32  (04/21 0700) BP: (64-130)/(35-90) 128/50 mmHg (04/21 0318) SpO2:  [85 %-100 %] 85 % (04/21 0700) Arterial Line BP: (113-142)/(66-97) 113/66 mmHg (04/20 1430) FiO2 (%):  [40 %-100 %] 40 % (04/21 0806) Weight:  [80.287 kg (177 lb)] 80.287 kg (177 lb) (04/20 1310)  Intake/Output from previous day:  Intake/Output Summary (Last 24 hours) at 12/20/11 1022 Last data filed at 12/20/11 0741  Gross per 24 hour  Intake 2491.02 ml  Output   1760 ml  Net 731.02 ml    Intake/Output this shift: Total I/O In: 12.5 [IV Piggyback:12.5] Out: -   Labs:  Basename 12/20/11 0954 12/20/11 0430 12/20/11 0100 12/19/11 2030 12/19/11 1604  HGB 8.1* 10.8* 8.9* 9.3* 9.5*    Basename 12/20/11 0954 12/20/11 0430  WBC 11.9* 10.1  RBC 2.75* 3.67*  HCT 24.3* 32.4*  PLT 241 253    Basename 12/20/11 0610 12/20/11 0100  NA 137 136  K 3.5 3.3*  CL 107 104  CO2 22 22  BUN 16 17  CREATININE 0.99 1.05  GLUCOSE 157* 198*  CALCIUM 7.8* 7.8*    Basename 12/20/11 0430 12/19/11 2040  LABPT -- --  INR 1.47 1.40    Exam - Neurovascular intact Dressing/Incision - clean, dry, no drainage, compartments are soft on both legs, no abnormal swelling appreciated. Cap refill is good.  Past Medical History  Diagnosis Date  . Diabetes mellitus type 1   . Hyperlipidemia   . Hypothyroidism   . Depression   . Umbilical hernia   . Hypertension     Assessment/Plan: 2 Days Post-Op Procedure(s) (LRB): INTRAMEDULLARY (IM) NAIL TIBIAL (Bilateral) Principal Problem:  *Hypotension Active Problems:  HYPOTHYROIDISM  DIABETES MELLITUS, TYPE I  HYPERCHOLESTEROLEMIA  DEPRESSION  Tibial fracture  NSTEMI (non-ST elevated myocardial infarction)  Anemia due to chronic illness  Hyperkalemia  Acute respiratory failure  Shock  Aspiration pneumonia   DVT Prophylaxis - Heparin currently NON Weight-Bearing to both leg Hold therapy until medically stable and able to cooperate.  Gunnison Chahal 12/20/2011, 10:22 AM

## 2011-12-20 NOTE — Progress Notes (Signed)
Visited with patient just as he was being transported to get a CT scan about 10:45am.  He looked more alert than yesterday, sort of in-between awake and asleep.  He was visually to the Chaplain less agitated.  With him going downstairs, Chaplain called his mother (in the hospital) on her cell phone to let her know he was in the hospital and he was being taken for his scan.  We talked a little and the Chaplain said he'd be seeing her later.    At 1:10pm, the Chaplain was coming to see the mother and ran into the patient's sister.  She wanted to talk about some family dynamics issues (that surfaced with this event in their lives) and they spent about 35 minutes together, with the Chaplain getting to meet the sister's daughter.  We all proceeded to joint the mother who was in the waiting area with the daughter's Renato Gails.  We all chatted for a while.  The Chaplain briefly gave the Renato Gails a quick "theological summary" of the balance between hope and the reality of what is occurring that he has been working on with the mother.  He also chatted with the mother, providing a review of the aspects and points they have discussed previously.  At about 2:15, the Chaplain departed.  Rema Jasmine, Chaplain Group Pager: [phone] Local Pager:  [phone]

## 2011-12-20 NOTE — Progress Notes (Signed)
INITIAL ADULT NUTRITION ASSESSMENT Date: 12/20/2011   Time: 8:42 AM Reason for Assessment: Consult for TF  ASSESSMENT: Male 46 y.o.  Dx: Bilateral tibia Fx,- repair 4/19, 4/20- acute SOB, hypoxia, confusion, refractory shock now intubated, NSTEMI  Hx: cocaine Past Medical History  Diagnosis Date  . Diabetes mellitus type 1   . Hyperlipidemia   . Hypothyroidism   . Depression   . Umbilical hernia   . Hypertension    Past Surgical History  Procedure Date  . Appendectomy     open    Related Meds: biotene, lipitor, calcium chloride, plavix, novolog, magnesium sulfate, protonix, KCl, kayexalate, vancocin  Ht: 5\' 6"  (167.6 cm)  Wt: 177 lb (80.287 kg)  Ideal Wt: 64.5kg % Ideal Wt: 124  Usual Wt:  Wt Readings from Last 3 Encounters:  12/19/11 177 lb (80.287 kg)  12/19/11 177 lb (80.287 kg)  05/10/11 180 lb (81.647 kg)   % Usual Wt: 98  Body mass index is 28.57 kg/(m^2).  Food/Nutrition Related Hx: unknown  Labs:  CMP     Component Value Date/Time   NA 137 12/20/2011 0610   K 3.5 12/20/2011 0610   CL 107 12/20/2011 0610   CO2 22 12/20/2011 0610   GLUCOSE 157* 12/20/2011 0610   BUN 16 12/20/2011 0610   CREATININE 0.99 12/20/2011 0610   CALCIUM 7.8* 12/20/2011 0610   PROT 4.6* 12/20/2011 0610   ALBUMIN 2.2* 12/20/2011 0610   AST 212* 12/20/2011 0610   ALT 83* 12/20/2011 0610   ALKPHOS 88 12/20/2011 0610   BILITOT 0.9 12/20/2011 0610   GFRNONAA >90 12/20/2011 0610   GFRAA >90 12/20/2011 0610   Lab Results  Component Value Date   HGBA1C 5.9* 12/19/2011   HGBA1C 6.7 09/23/2010   HGBA1C 6.2 05/14/2010   Lab Results  Component Value Date   MICROALBUR 1.51 05/14/2010   LDLCALC 126* 06/11/2010   CREATININE 0.99 12/20/2011    I/O last 3 completed shifts: In: 5733.5 [I.V.:3818.5; NG/GT:125; IV Piggyback:1790] Out: 3610 [Urine:3235; Blood:375] Total I/O In: 12.5 [IV Piggyback:12.5] Out: -     Diet Order:  NPO  Supplements/Tube Feeding:  Jevity 1.2 at 20 ml/hr,  increase every 4 hours to goal of 40 ml/hr  IVF:    sodium chloride   fentaNYL infusion INTRAVENOUS Last Rate: 150 mcg/hr (12/20/11 0700)  heparin Last Rate: 15 Units/kg/hr (12/20/11 0700)  insulin (NOVOLIN-R) infusion Last Rate: 2 Units/hr (12/20/11 0741)  midazolam (VERSED) infusion Last Rate: 5 mg/hr (12/20/11 0700)  norepinephrine (LEVOPHED) Adult infusion Last Rate: 5 mcg/min (12/19/11 1442)  DISCONTD: sodium chloride Last Rate: 75 mL (12/19/11 1702)  DISCONTD: fentaNYL infusion INTRAVENOUS   DISCONTD: lactated ringers   DISCONTD: lactated ringers   DISCONTD: midazolam (VERSED) infusion   DISCONTD: phenylephrine (NEO-SYNEPHRINE) Adult infusion     Estimated Nutritional Needs:  Per day   Kcal: 2314-2545  Protein: 100-115 g protein Fluid: >2.2L  Diet hx unknown. A1C indicate good glucose control.  Hypoglycemic prior to admit.   Hx of Cocaine use.  2% weight loss in the past 6 months.  Meets criteria for overweight.  Pt to begin TF after CT.  NUTRITION DIAGNOSIS: -Inadequate oral intake (NI-2.1).  Status: Ongoing  RELATED TO: vent  AS EVIDENCE BY: NPO status  MONITORING/EVALUATION(Goals): Monitor:  TF tolerance, labs, weight trend Goal:  TF to meet 100% estimated needs.  EDUCATION NEEDS: -No education needs identified at this time  INTERVENTION: Pt to begin TF after CT per OG tube.  Jevity 1.2 at  20 ml/hr to increase 10 ml every 4 hours to goal of 80 ml/hr to provide:  2304 kcals, 107 g protein, 1555 ml free water.  Dietitian [phone] week end/ [phone] week day  DOCUMENTATION CODES Per approved criteria  -Not Applicable    Jeoffrey Massed 12/20/2011, 8:42 AM

## 2011-12-20 NOTE — Progress Notes (Signed)
Pt clamping down on ETT peak pressures in the high 40's, attempted to place oral airway, pt became very agitated/combative. RN had given bolus, attempt not successful. RN aware.

## 2011-12-20 NOTE — Progress Notes (Signed)
PT Cancellation Note  Treatment cancelled today due to medical issues with patient which prohibited therapy. Noted events of yesterday. Will hold PT until pt is medically stable. Please re-order when appropriate. Thanks.  Ralene Bathe Kistler 12/20/2011, 8:42 AM [phone]

## 2011-12-20 NOTE — Progress Notes (Signed)
ANTICOAGULATION CONSULT NOTE - Follow Up Consult  Pharmacy Consult for IV Heparin Indication: NSTEMI  No Known Allergies  Patient Measurements: Height: 5\' 6"  (167.6 cm) Weight: 177 lb (80.287 kg) IBW/kg (Calculated) : 63.8  Heparin Dosing Weight: 80 kg  Vital Signs: Temp: 102.3 F (39.1 C) (04/21 0806) Temp src: Oral (04/21 0806) BP: 128/50 mmHg (04/21 0318) Pulse Rate: 74  (04/21 0700)  Labs:  Basename 12/20/11 1205 12/20/11 0954 12/20/11 0945 12/20/11 0610 12/20/11 0430 12/20/11 0100 12/19/11 2040 12/19/11 1335 12/19/11 0346  HGB -- 8.1* -- -- 10.8* -- -- -- --  HCT -- 24.3* -- -- 32.4* 26.1* -- -- --  PLT -- 241 -- -- 253 277 -- -- --  APTT -- -- -- -- 48* -- 29 -- --  LABPROT -- -- -- -- 18.1* -- 17.4* -- 14.7  INR -- -- -- -- 1.47 -- 1.40 -- 1.13  HEPARINUNFRC 0.19* -- -- -- 0.11* -- -- -- --  CREATININE -- -- 1.00 0.99 -- 1.05 -- -- --  CKTOTAL -- -- -- 972* -- -- 1658* 1478* --  CKMB -- -- -- 48.2* -- -- 261.2* 207.5* --  TROPONINI -- -- -- >25.00* -- -- >25.00* >25.00* --   Estimated Creatinine Clearance: 92.9 ml/min (by C-G formula based on Cr of 1).   Assessment:  45 yom on IV heparin for NSTEMI. Pt is s/p surgery for tibial fracture 4/19.  Heparin level remains subtherapeutic despite rate increase.  Not providing boluses considering recent surgery.  CBC ok. No bleeding/complications reported.   Goal of Therapy:  Heparin level 0.3-0.7 units/ml   Plan:  Increase IV heparin rate to 1450 units/hr. Recheck heparin level in 6 hours.  Clance Boll 12/20/2011,12:34 PM

## 2011-12-20 NOTE — Progress Notes (Signed)
Name: Reginald Gutierrez MRN: [id number] DOB: 21-Feb-1966    LOS: 2  PCCM ADMISSION NOTE  History of Present Illness: 46 yr old type 1 DM, prior coc abuse admitted with bilateral tibia Fx. To OR 4/19 for repair of tibai Fx. In am 4/20 acute SOB, hypoxia, confusin, hypoxia and refractory Shock. To ICU, confused, some tan secretions. No CP. Had small rise trop noted, ECG with reslving ST depression. Found to be acidotic , hypoxic required emergent intubation. Pressors treated and A line require.Unclear etiology.  Lines / Drains: 4/20 ETT>>> 4/20 line rt ij>>> 4/20 Aline rt fem>>>  Abx: Zosyn 4/20>>> vanc 4/20>>>  Cultures: BC 4/20>>> Sputum 4/20>>>  Tests / Events: 4/19- repair bilat tibia Fx 4/20- refractory shock, hypoxic resp failure., intubated 4/20- bilateral infiltrates bases rt greaterleft 4/20 CT head - neg acute 4/20- hep started, CT chest spiral contrast avoided secondary to ARF, crt rise 4/20 echo-Systolic function was severely reduced. The estimated ejection fraction was in the range of 25% to 30%. There is akinesis of the mid-distalanteroseptal and apical myocardium. Left ventricular diastolic function parameters were normal. - Mitral valve: Mild regurgitation. - Right ventricle: Systolic function was mildly reduced. 4/20- hyperkalemia 4/21- improved k, renal failure, agitation  Vital Signs: Temp:  [97.4 F (36.3 C)-103.1 F (39.5 C)] 102.8 F (39.3 C) (04/21 0600) Pulse Rate:  [74-125] 74  (04/21 0700) Resp:  [15-35] 32  (04/21 0700) BP: (64-130)/(35-90) 128/50 mmHg (04/21 0318) SpO2:  [85 %-100 %] 85 % (04/21 0700) Arterial Line BP: (113-142)/(66-97) 113/66 mmHg (04/20 1430) FiO2 (%):  [40 %-100 %] 40 % (04/21 0700) Weight:  [80.287 kg (177 lb)] 80.287 kg (177 lb) (04/20 1310) I/O last 3 completed shifts: In: 5706.8 [I.V.:3791.8; NG/GT:125; IV Piggyback:1790] Out: 3610 [Urine:3235; Blood:375]  Physical Examination: General: rass -1, agitation Neuro:   Nonfocal, equal power, perrl 3 mm HEENT:  obese Neck:  Obsese, line some blood Cardiovascular:  s1 s2 rrt reduced Lungs:  Rhonchi bases Abdomen:  Soft, BS wnl no r, reducible hernia Musculoskeletal: bilat lower ext  casts Skin:  No rash  Ventilator settings: Vent Mode:  [-] PRVC FiO2 (%):  [40 %-100 %] 40 % Set Rate:  [30 bmp-35 bmp] 35 bmp Vt Set:  [450 mL-500 mL] 500 mL PEEP:  [5 cmH20] 5 cmH20 Plateau Pressure:  [22 cmH20-28 cmH20] 27 cmH20  Labs and Imaging:  Reviewed.  Please refer to the Assessment and Plan section for relevant results.  Assessment and Plan: 1. Refractory Shock Etiology: r/o cardiogenic (ischemic) vs septic, r/o obstructive PE , r/o fat emboli syndrome -off all pressors this am , responded to volume well -consider dc a line in am  -follow repeat lactic acid -echo reviewed, RV likely does not explain PE as primary cause -see ischemia NSTEMI section -cortisol 30, no role steroids -cvp noted 16, will need lasix soon, would like to CT chest today with contrast, hold lasix for now as ARF risk  2. Hypoxic Acute resp failure -CT chest reviewed, aspiration likely, fat emboli also still remains a possibility -ABg now, consider SBT, no intention to extubate today likely -r/o alkalosis now, likely will need reduction in MV -heparin continue , eval CT spiral -doppler legs result pending  3. NSTEMI ( st changes back to normal at 11 am from 5 am ecg) -from outside stress vs primary event -Echo reviewed, akinesis noted, likely has had a undx cardiomyoapthy and has sig CAD, he has "failed his stress test" in am yesterday, but my  sense is That this was not the primary event. Will need a cath at some time pre dc -plavix given, asa -shock improved, but MAP 65, when able then beta blocker -tox screen neg coc -statin -hep drip  4. R/o fat emoboli syndrome -confusion out of proportion to ph , fever, ARF, and bilat tibia fx increases suspcision CT chest now with  contrast as renals resolved -RV does not explain shock state -supportive if fat emboli  5. Change in MS -r/o metabolic enceph, r/o fat emboli -requires versed, fent drip still, rass -1 to -2 as goal -if BP tolerates could transition to propofol  6. R/o sepsis, aspiration, nosocomial Continue vanc, zosyn BC sent Pct algorthym to narrow and shorten course  7. DM, hyperglycemia Insulin drip Synthroid home med, hold ( in setting tachy,. MI) reduce in future , see TSH R/o sick euthyroid contribution, T3 T4  8. Tibial fx -hep tolerated thus far -etiology of breaks? Still a mystery, will need social workers Updated ortho, they are ok with heparin if life saving of course  9. Malnurished -start T post CT  ppi  Best practices / Disposition: -->ICU status under PCCM -->full code -->hep drip -->Protonix for GI Px -->ventilator bundle -->diet TF 4/21 -->family updated  The patient is critically ill with multiple organ systems failure and requires high complexity decision making for assessment and support, frequent evaluation and titration of therapies, application of advanced monitoring technologies and extensive interpretation of multiple databases. Critical Care Time devoted to patient care services described in this note is 50 minutes.  [name] 12/20/2011, 7:41 AM

## 2011-12-20 NOTE — Progress Notes (Signed)
Summation of event for 12/19/2011; Patient was an RRT call and subsequently code blue called d/t seizure& hypoxia.  Transferred to ICU, CCM called to evaluate; patient was electively intubated to protect airway (sister at bedside for conversation of patient status and procedures) Kirsten (sister) gave verbal consent for intubation, arterial line insertion and central line insertion.  Critical result verbally given to Dr. Hazle Quant ( positve troponing, CKMB, elevated blood sugar and potassium) orders were received and carried out.  Patient was a 1:1 from admission to ICU 1300 till 1900.

## 2011-12-21 ENCOUNTER — Inpatient Hospital Stay (HOSPITAL_COMMUNITY): Payer: Medicare Other

## 2011-12-21 DIAGNOSIS — E46 Unspecified protein-calorie malnutrition: Secondary | ICD-10-CM | POA: Diagnosis not present

## 2011-12-21 LAB — GLUCOSE, CAPILLARY
Glucose-Capillary: 153 mg/dL — ABNORMAL HIGH (ref 70–99)
Glucose-Capillary: 137 mg/dL — ABNORMAL HIGH (ref 70–99)
Glucose-Capillary: 139 mg/dL — ABNORMAL HIGH (ref 70–99)
Glucose-Capillary: 203 mg/dL — ABNORMAL HIGH (ref 70–99)
Glucose-Capillary: 192 mg/dL — ABNORMAL HIGH (ref 70–99)
Glucose-Capillary: 195 mg/dL — ABNORMAL HIGH (ref 70–99)
Glucose-Capillary: 146 mg/dL — ABNORMAL HIGH (ref 70–99)
Glucose-Capillary: 145 mg/dL — ABNORMAL HIGH (ref 70–99)
Glucose-Capillary: 158 mg/dL — ABNORMAL HIGH (ref 70–99)
Glucose-Capillary: 123 mg/dL — ABNORMAL HIGH (ref 70–99)
Glucose-Capillary: 155 mg/dL — ABNORMAL HIGH (ref 70–99)
Glucose-Capillary: 193 mg/dL — ABNORMAL HIGH (ref 70–99)

## 2011-12-21 LAB — BLOOD GAS, ARTERIAL
Bicarbonate: 23 mEq/L (ref 20.0–24.0)
Bicarbonate: 21.9 mEq/L (ref 20.0–24.0)
Drawn by: 103701
MECHVT: 0.5 mL
MECHVT: 520 mL
MECHVT: 450 mL
O2 Saturation: 96.1 %
PEEP: 5 cmH2O
PEEP: 8 cmH2O
PEEP: 8 cmH2O
Patient temperature: 99.3
Patient temperature: 99.4
Patient temperature: 99.9
TCO2: 20.9 mmol/L (ref 0–100)
pCO2 arterial: 41.7 mmHg (ref 35.0–45.0)
pCO2 arterial: 42.5 mmHg (ref 35.0–45.0)
pH, Arterial: 7.364 (ref 7.350–7.450)
pH, Arterial: 7.356 (ref 7.350–7.450)
pH, Arterial: 7.358 (ref 7.350–7.450)

## 2011-12-21 LAB — COMPREHENSIVE METABOLIC PANEL
ALT: 59 U/L — ABNORMAL HIGH (ref 0–53)
AST: 104 U/L — ABNORMAL HIGH (ref 0–37)
Calcium: 7.5 mg/dL — ABNORMAL LOW (ref 8.4–10.5)
Sodium: 137 mEq/L (ref 135–145)
Total Protein: 4.7 g/dL — ABNORMAL LOW (ref 6.0–8.3)

## 2011-12-21 LAB — VANCOMYCIN, TROUGH: Vancomycin Tr: 21.8 ug/mL — ABNORMAL HIGH (ref 10.0–20.0)

## 2011-12-21 LAB — CBC
HCT: 22.6 % — ABNORMAL LOW (ref 39.0–52.0)
Hemoglobin: 7.4 g/dL — ABNORMAL LOW (ref 13.0–17.0)
MCH: 29.5 pg (ref 26.0–34.0)
MCV: 90 fL (ref 78.0–100.0)
RBC: 2.51 MIL/uL — ABNORMAL LOW (ref 4.22–5.81)

## 2011-12-21 LAB — DIFFERENTIAL
Eosinophils Absolute: 0.2 10*3/uL (ref 0.0–0.7)
Eosinophils Relative: 2 % (ref 0–5)
Lymphs Abs: 1.7 10*3/uL (ref 0.7–4.0)
Monocytes Relative: 10 % (ref 3–12)
Neutrophils Relative %: 75 % (ref 43–77)

## 2011-12-21 LAB — HEPARIN LEVEL (UNFRACTIONATED): Heparin Unfractionated: 0.4 IU/mL (ref 0.30–0.70)

## 2011-12-21 MED ORDER — INSULIN GLARGINE 100 UNIT/ML ~~LOC~~ SOLN
38.0000 [IU] | SUBCUTANEOUS | Status: DC
  Administered 2011-12-21: 38 [IU] via SUBCUTANEOUS

## 2011-12-21 MED ORDER — VANCOMYCIN HCL 1000 MG IV SOLR
1250.0000 mg | Freq: Two times a day (BID) | INTRAVENOUS | Status: DC
  Administered 2011-12-22: 1250 mg via INTRAVENOUS
  Filled 2011-12-21 (×2): qty 1250

## 2011-12-21 MED ORDER — SODIUM CHLORIDE 0.9 % IV BOLUS (SEPSIS)
500.0000 mL | Freq: Once | INTRAVENOUS | Status: AC
  Administered 2011-12-21: 500 mL via INTRAVENOUS

## 2011-12-21 MED ORDER — INSULIN ASPART 100 UNIT/ML ~~LOC~~ SOLN
0.0000 [IU] | SUBCUTANEOUS | Status: DC
  Administered 2011-12-21 (×2): 6 [IU] via SUBCUTANEOUS
  Administered 2011-12-22: 10 [IU] via SUBCUTANEOUS

## 2011-12-21 MED ORDER — PANTOPRAZOLE SODIUM 40 MG PO PACK
40.0000 mg | PACK | Freq: Every day | ORAL | Status: DC
  Administered 2011-12-21 – 2011-12-22 (×2): 40 mg
  Filled 2011-12-21 (×2): qty 20

## 2011-12-21 MED ORDER — VANCOMYCIN HCL IN DEXTROSE 1-5 GM/200ML-% IV SOLN
1000.0000 mg | Freq: Three times a day (TID) | INTRAVENOUS | Status: DC
  Administered 2011-12-21: 1000 mg via INTRAVENOUS
  Filled 2011-12-21 (×3): qty 200

## 2011-12-21 MED ORDER — DEXTROSE 5 % IV SOLN
160.0000 mg | Freq: Once | INTRAVENOUS | Status: AC
  Administered 2011-12-21: 160 mg via INTRAVENOUS
  Filled 2011-12-21 (×2): qty 16

## 2011-12-21 MED ORDER — DEXTROSE 10 % IV SOLN: INTRAVENOUS | Status: DC

## 2011-12-21 MED ORDER — FUROSEMIDE 10 MG/ML IJ SOLN
60.0000 mg | Freq: Once | INTRAMUSCULAR | Status: AC
  Administered 2011-12-21: 60 mg via INTRAVENOUS
  Filled 2011-12-21: qty 6

## 2011-12-21 NOTE — Progress Notes (Signed)
Name: Reginald Gutierrez MRN: [id number] DOB: Jun 26, 1966    LOS: 3  PCCM PROGRESS NOTE  History of Present Illness: 46 yr old type 1 DM, prior coc abuse admitted with bilateral tibia Fx s/p syncopal episode. To OR 4/19 for repair of tibai Fx. In am 4/20 acute SOB, hypoxia, confusin, hypoxia and refractory Shock. To ICU, confused, some tan secretions. No CP. Had small rise trop noted, ECG with resolving ST depression. Found to be acidotic , hypoxic required emergent intubation. Pressors treated and A line require.Unclear etiology.  Lines / Drains: 4/20 ETT>>> 4/20 line rt ij>>> 4/20 Aline rt fem>>>  Cultures: BC 4/20>>> Sputum 4/20>>>NF  Abx: Zosyn 4/20>>> vanc 4/20>>>4/22  Tests / Events: 4/19- repair bilat tibia Fx 4/20- refractory shock, hypoxic resp failure., intubated 4/20- bilateral infiltrates bases rt greaterleft 4/20 CT head - neg acute 4/20- hep started, CT chest spiral contrast avoided secondary to ARF, crt rise 4/20 echo-Systolic function was severely reduced. The estimated ejection fraction was in the range of 25% to 30%. There is akinesis of the mid-distalanteroseptal and apical myocardium. Left ventricular diastolic function parameters were normal. - Mitral valve: Mild regurgitation. - Right ventricle: Systolic function was mildly reduced. 4/21 CT angio chest: No central pulmonary embolism. Slight progression of right greater than left airspace disease.  Subjective/interval Agitated and desaturated briefly this am, responded to PEEP and sedation  Vital Signs: BP 75/52  Pulse 133  Temp(Src) 101.5 F (38.6 C) (Oral)  Resp 17  Ht 5\' 6"  (1.676 m)  Wt 89.6 kg (197 lb 8.5 oz)  BMI 31.88 kg/m2  SpO2 92% .4 CVP:  [9 mmHg-12 mmHg] 10 mmHg    . sodium chloride 50 mL/hr at 12/21/11 1000  . fentaNYL infusion INTRAVENOUS 150 mcg/hr (12/21/11 0945)  . heparin 1,600 Units/hr (12/21/11 0900)  . insulin (NOVOLIN-R) infusion 3.4 mL/hr at 12/21/11 0900  . midazolam (VERSED)  infusion 3 mg/hr (12/21/11 0945)   I/O last 3 completed shifts: In: 4643.9 [I.V.:2628.9; NG/GT:770; IV Piggyback:1245] Out: 1804 [Urine:1804]  Physical Examination: General: sedated currently, was very agitated.  Neuro:  Nonfocal, equal power, perrl 3 mm HEENT:  obese Neck:  Obsese, line some blood Cardiovascular:  s1 s2 rrt reduced Lungs:  Rhonchi diffuse Abdomen:  Soft, BS wnl no r, reducible hernia Musculoskeletal: bilat lower ext  casts Skin:  No rash  Ventilator settings: Vent Mode:  [-] PRVC FiO2 (%):  [30 %-40 %] 30 % Set Rate:  [18 bmp] 18 bmp Vt Set:  [500 mL] 500 mL PEEP:  [5 cmH20] 5 cmH20 Pressure Support:  [5 cmH20] 5 cmH20 Plateau Pressure:  [21 cmH20-26 cmH20] 25 cmH20  Labs and Imaging:   Lab 12/21/11 0410 12/20/11 2250 12/20/11 1948  NA 137 139 138  K 4.3 4.0 3.8  CL 107 109 107  CO2 24 25 24   BUN 20 17 18   CREATININE 1.21 1.06 1.14  GLUCOSE 208* 165* 225*    Lab 12/21/11 0410 12/20/11 2250 12/20/11 1948  HGB 7.4* 8.1* 8.7*  HCT 22.6* 24.4* 27.0*  WBC 12.0* 12.5* 10.9*  PLT 253 270 212    Lab 12/21/11 0410 12/20/11 2250 12/20/11 1948 12/20/11 1543 12/20/11 0945 12/19/11 1335 12/19/11 1330  PROCALCITON -- -- -- -- 7.20 -- 8.56  WBC 12.0* 12.5* 10.9* 15.4* -- -- --  LATICACIDVEN -- -- -- -- 2.9* 3.8* --   PCXR: personally reviewed. Now w/ increasing R>L airspace disease.   Assessment and Plan: Shock (multifactorial), most likely cardiogenic +/- sepsis.  Now resolved.  Plan: -f/u procalcitonin in am  Hypoxic Acute resp failure in setting of R>L bilateral infiltrates: most likely due to RLL PNA, possible aspiration and what may be evolving ALI vs edema.  Fat emboli also still remains a possibility, CT chest was neg for VTE. CXR on 4/22 showing R>L bilateral airspace disease.  Plan: -ARDS protocol -sedation protocol -scheduled BDs -f/u CXR and abg -daily assessment for weaning -re-eval on 4/23 to consider diuresis -cont HCAP coverage, stop  vanc as no evidence of MRSA  NSTEMI ? The primary event given "syncopal episode". Has significant systolic heart failure.   Lab 12/20/11 0610 12/19/11 2040 12/19/11 1335  CKTOTAL 972* 1658* 1478*  CKMB 48.2* 261.2* 207.5*  TROPONINI >25.00* >25.00* >25.00*  Plan: -will need a cath at some time pre dc -plavix given, asa -shock improved, but MAP 65, when able then beta blocker -statin -hep drip  R/o fat emoboli syndrome Plan: -supportive if fat emboli  Change in MS/ acute encephalopathy:  metabolic enceph vs  fat emboli Plan -supportive care   DM, hyperglycemia  CBG (last 3)   Basename 12/21/11 0922 12/21/11 0819 12/21/11 0714  GLUCAP 144* 145* 146*  plan: -cont insulin  sick euthyroid Plan: Synthroid home med, held ( in setting tachy,. MI) reduce in future  Tibial fx -hep tolerated thus far -etiology of breaks? Still a mystery, Updated ortho, they are ok with heparin if life saving of course  Malnutrition -cont tubefeeds.   Best practices / Disposition: -->ICU status under PCCM -->full code -->hep drip -->Protonix for GI Px -->ventilator bundle -->diet TF 4/21   BABCOCK,PETE 12/21/2011, 10:41 AM  Reviewed above, examined pt, and agree with assessment/plan.  He has pneumonia likely from aspiration.  Will continue current abx including vancomycin pending culture results.  Still requires full vent support.  Increased PEEP to maintain oxygenation, and increased Vt to 8 cc/kg (520).  Adjusted inspiratory flow rate to allow better vent synchrony.    Has acute systolic CHF from NSTEMI.  Appreciate help from cardiology.  Will need cath at some point.  If BP stabilizes would likely benefit from diuresis at some point.  Post-op care for b/l leg fractures per ortho.  Critical care time 45 minutes.  Coralyn Helling, MD 12/21/2011, 12:18 PM Pager:  [phone]

## 2011-12-21 NOTE — Progress Notes (Signed)
ANTIBIOTIC CONSULT NOTE - follow up  Pharmacy Consult for Vancomycin/Zosyn Indication: aspiration vs HAP PNA  No Known Allergies  Patient Measurements: Height: 5\' 6"  (167.6 cm) Weight: 197 lb 8.5 oz (89.6 kg) IBW/kg (Calculated) : 63.8   Vital Signs: Temp: 99.9 F (37.7 C) (04/22 1300) Temp src: Oral (04/22 1200) BP: 75/52 mmHg (04/22 1000) Pulse Rate: 95  (04/22 1500) Intake/Output from previous day: 04/21 0701 - 04/22 0700 In: 2902.9 [I.V.:1385.4; NG/GT:770; IV Piggyback:747.5] Out: 844 [Urine:844] Intake/Output from this shift: Total I/O In: 622 [I.V.:432; Other:60; NG/GT:130] Out: 95 [Urine:95]  Labs:  [facility] 12/21/11 0410 12/20/11 2250 12/20/11 1948  WBC 12.0* 12.5* 10.9*  HGB 7.4* 8.1* 8.7*  PLT 253 270 212  LABCREA -- -- --  CREATININE 1.21 1.06 1.14   Estimated Creatinine Clearance: 80.8 ml/min (by C-G formula based on Cr of 1.21).  Basename 12/21/11 1400  VANCOTROUGH 21.8*  VANCOPEAK --  Drue Dun --  GENTTROUGH --  GENTPEAK --  GENTRANDOM --  TOBRATROUGH --  TOBRAPEAK --  TOBRARND --  AMIKACINPEAK --  AMIKACINTROU --  AMIKACIN --     Microbiology: Recent Results (from the past 720 hour(s))  MRSA PCR SCREENING     Status: Normal   Collection Time   12/19/11  2:44 PM      Component Value Range Status Comment   MRSA by PCR NEGATIVE  NEGATIVE  Final   CULTURE, BLOOD (ROUTINE X 2)     Status: Normal (Preliminary result)   Collection Time   12/19/11  3:43 PM      Component Value Range Status Comment   Specimen Description BLOOD LEFT ARM  5 ML IN [facility] BOTTLE   Final    Special Requests NONE   Final    Culture  Setup Time [id number]   Final    Culture     Final    Value:        BLOOD CULTURE RECEIVED NO GROWTH TO DATE CULTURE WILL BE HELD FOR 5 DAYS BEFORE ISSUING A FINAL NEGATIVE REPORT   Report Status PENDING   Incomplete   CULTURE, BLOOD (ROUTINE X 2)     Status: Normal (Preliminary result)   Collection Time   12/19/11  3:52 PM   Component Value Range Status Comment   Specimen Description BLOOD LEFT HAND  3 ML IN [facility] BOTTLE   Final    Special Requests NONE   Final    Culture  Setup Time [id number]   Final    Culture     Final    Value:        BLOOD CULTURE RECEIVED NO GROWTH TO DATE CULTURE WILL BE HELD FOR 5 DAYS BEFORE ISSUING A FINAL NEGATIVE REPORT   Report Status PENDING   Incomplete   CULTURE, RESPIRATORY     Status: Normal (Preliminary result)   Collection Time   12/19/11  5:31 PM      Component Value Range Status Comment   Specimen Description TRACHEAL ASPIRATE   Final    Special Requests NONE   Final    Gram Stain     Final    Value: RARE WBC PRESENT,BOTH PMN AND MONONUCLEAR     RARE SQUAMOUS EPITHELIAL CELLS PRESENT     NO ORGANISMS SEEN   Culture Non-Pathogenic Oropharyngeal-type Flora Isolated.   Final    Report Status PENDING   Incomplete     Medical History: Past Medical History  Diagnosis Date  . Diabetes mellitus type 1   .  Hyperlipidemia   . Hypothyroidism   . Depression   . Umbilical hernia   . Hypertension     Assessment:  Day 3 Vancomycin/Zosyn for aspiration vs HAP  Vanc Trough slightly elevated above goal  SCr increased  Patient still febrile  Goal of Therapy:  Vancomycin trough level 15-20 mcg/ml  Plan:  1. Will change Vancomycin from 1g IV q8 to 1250mg  IV q12.  2. Continue Zosyn EI 3. Will recheck a vanc trough again as necessary   Hessie Knows, PharmD, BCPS Pager [phone] 12/21/2011 3:30 PM

## 2011-12-21 NOTE — Progress Notes (Signed)
The patient is receiving Protonix by the intravenous route.  Based on criteria approved by the Pharmacy and Therapeutics Committee and the Medical Executive Committee, the medication is being converted to the equivalent oral dose form.  These criteria include: -No Active GI bleeding -Able to tolerate diet of full liquids (or better) or tube feeding OR able to tolerate other medications by the oral or enteral route  If you have any questions about this conversion, please contact the Pharmacy Department (ext [phone]).  Thank you.  Berkley Harvey, [facility] 12/21/2011 7:10 AM

## 2011-12-21 NOTE — Progress Notes (Signed)
Patient ID: Reginald Gutierrez, male   DOB: 18-Mar-1966, 46 y.o.   MRN: [id number]   Subjective:  The patient is sedated on vent. Rhythm is sinus tachycardia.  NSVT      Objective:  Vital Signs in the last 24 hours: Temp:  [98.6 F (37 C)-102 F (38.9 C)] 102 F (38.9 C) (04/22 0400) Pulse Rate:  [101-139] 112  (04/22 0600) Resp:  [12-30] 17  (04/22 0600) BP: (105-109)/(46-55) 105/46 mmHg (04/22 0346) SpO2:  [96 %-100 %] 100 % (04/22 0600) FiO2 (%):  [30 %-40 %] 30 % (04/22 0801) Weight:  [89.6 kg (197 lb 8.5 oz)] 89.6 kg (197 lb 8.5 oz) (04/22 0431)  Intake/Output from previous day: 04/21 0701 - 04/22 0700 In: 2902.9 [I.V.:1385.4; NG/GT:770; IV Piggyback:747.5] Out: 844 [Urine:844] Intake/Output from this shift:       . antiseptic oral rinse  15 mL Mouth Rinse QID  . atorvastatin  40 mg Oral q1800  . chlorhexidine  15 mL Mouth Rinse BID  . clopidogrel  75 mg Oral Q breakfast  . feeding supplement (JEVITY 1.2 CAL)  1,000 mL Per Tube Q24H  . insulin aspart  5 Units Intravenous Once  . metoprolol  2.5 mg Intravenous Q6H  . pantoprazole sodium  40 mg Per Tube Q1200  . piperacillin-tazobactam (ZOSYN)  IV  3.375 g Intravenous Q8H  . sodium chloride  1,000 mL Intravenous Once  . sodium chloride  3 mL Intravenous Q12H  . vancomycin  1,000 mg Intravenous Q8H  . DISCONTD: feeding supplement (JEVITY 1.2 CAL)  1,000 mL Per Tube Q24H  . DISCONTD: pantoprazole (PROTONIX) IV  40 mg Intravenous QHS      . sodium chloride 50 mL/hr at 12/21/11 0600  . fentaNYL infusion INTRAVENOUS 150 mcg/hr (12/21/11 0600)  . heparin 1,600 Units/hr (12/21/11 0600)  . insulin (NOVOLIN-R) infusion 3.4 Units/hr (12/21/11 0717)  . midazolam (VERSED) infusion 3 mg/hr (12/21/11 0600)  . norepinephrine (LEVOPHED) Adult infusion 5 mcg/min (12/19/11 1442)  . DISCONTD: heparin 15 Units/kg/hr (12/20/11 0700)    Physical Exam: The patient appears to be in no distress, on ventilator, sedated.   Chest is clear  anteriorly.  No rales or rhonchi.  Expansion of the chest is symmetrical.  Heart reveals no abnormal lift or heave.  First and second heart sounds are normal.  There is no murmur gallop rub or click.  The abdomen is soft and no apparent tenderness. Bowel sounds are normoactive.  There is no hepatosplenomegaly or mass.  There are no abdominal bruits.  Extremities have ace wraps bilat.  Toes are warm and well-perfused.  Neurologic exam: Sedated on vent.  Integument reveals no rash  Right IJ access  Lab Results:  Basename 12/21/11 0410 12/20/11 2250  WBC 12.0* 12.5*  HGB 7.4* 8.1*  PLT 253 270    Basename 12/21/11 0410 12/20/11 2250  NA 137 139  K 4.3 4.0  CL 107 109  CO2 24 25  GLUCOSE 208* 165*  BUN 20 17  CREATININE 1.21 1.06    Basename 12/20/11 0610 12/19/11 2040  TROPONINI >25.00* >25.00*   Hepatic Function Panel  Basename 12/21/11 0410  PROT 4.7*  ALBUMIN 2.0*  AST 104*  ALT 59*  ALKPHOS 83  BILITOT 0.5  BILIDIR --  IBILI --    Basename 12/20/11 0610  CHOL 77   No results found for this basename: PROTIME in the last 72 hours  Imaging:  Cardiac Studies: Study Conclusions 2D echo  - Left ventricle:  The cavity size was normal. Wall thickness was normal. Systolic function was severely reduced. The estimated ejection fraction was in the range of 25% to 30%. There is akinesis of the mid-distalanteroseptal and apical myocardium. Left ventricular diastolic function parameters were normal. - Mitral valve: Mild regurgitation. - Right ventricle: Systolic function was mildly reduced. Transthoracic echocardiography. M-mode, complete 2D,   Assessment/Plan:  Patient Active Hospital Problem List:     Tibial fracture (12/18/2011)   Assessment: Stable after surgery.  Per ortho no evidence of compartment syndrome.   Plan: Continue IV heparin NSTEMI (non-ST elevated myocardial infarction) (12/19/2011)   Assessment: EKG improved. BP improved.  Still  tachycardic.  NSVT    Plan: Add low dose metoprolol with parameters. Eventual cardiac cath before going home but no hurry.   LOS: 3 days    Charlton Haws 12/21/2011, 8:11 AM

## 2011-12-21 NOTE — Progress Notes (Signed)
eLink Physician-Brief Progress Note Patient Name: Reginald Gutierrez DOB: [birth year] MRN: [id number]  Date of Service  12/21/2011   HPI/Events of Note   Pt remains oliguric  eICU Interventions  Lasix IVP given x one dose    Intervention Category Intermediate Interventions: Oliguria - evaluation and management  Shan Levans 12/21/2011, 3:58 PM

## 2011-12-21 NOTE — Progress Notes (Signed)
Subjective: Pt intubated but reacts to verbal   Objective: Vital signs in last 24 hours: Temp:  [98.6 F (37 C)-102.3 F (39.1 C)] 102 F (38.9 C) (04/22 0400) Pulse Rate:  [101-139] 112  (04/22 0600) Resp:  [12-30] 17  (04/22 0600) BP: (105-109)/(46-55) 105/46 mmHg (04/22 0346) SpO2:  [96 %-100 %] 100 % (04/22 0600) FiO2 (%):  [30 %-40 %] 30 % (04/22 0600) Weight:  [89.6 kg (197 lb 8.5 oz)] 89.6 kg (197 lb 8.5 oz) (04/22 0431)  Intake/Output from previous day: 04/21 0701 - 04/22 0700 In: 2902.9 [I.V.:1385.4; NG/GT:770; IV Piggyback:747.5] Out: 844 [Urine:844] Intake/Output this shift:     Basename 12/21/11 0410 12/20/11 2250 12/20/11 1948 12/20/11 1543 12/20/11 0954  HGB 7.4* 8.1* 8.7* 8.6* 8.1*    Basename 12/21/11 0410 12/20/11 2250  WBC 12.0* 12.5*  RBC 2.51* 2.73*  HCT 22.6* 24.4*  PLT 253 270    Basename 12/21/11 0410 12/20/11 2250  NA 137 139  K 4.3 4.0  CL 107 109  CO2 24 25  BUN 20 17  CREATININE 1.21 1.06  GLUCOSE 208* 165*  CALCIUM 7.5* 7.6*    Basename 12/20/11 0430 12/19/11 2040  LABPT -- --  INR 1.47 1.40    Pt on vent appears comfortable, Bilateral lower leg wounds clean and approx with staples. Calf on both legs soft no pain reaction, 1+ DP bilateral feet, minimal swelling of feet, skin warm with brisk cap refill.  Assessment/Plan: POD#3 S/P Bilateral IM nailing of Tibial Fx stable. Plan continue with elevation and Ice bilateral legs. Non weight bearing for 6-8 weeks. Will need SNF placement when stable and discharged. Will start PT when stable and ok from Cardiology for ROM of knees and ankles.   Reginald Gutierrez 12/21/2011, 7:07 AM

## 2011-12-21 NOTE — Progress Notes (Addendum)
PT Cancellation Note  Treatment cancelled today due to medical issues with patient which prohibited therapy.  Pt on vent, PT was discontinued by MD also, will sign-off.  Please re-order PT when appropriate.  Thank you.  [facility] 12/21/2011, 8:42 AM

## 2011-12-21 NOTE — Progress Notes (Signed)
CSW reviewed chart and spoke with the team. Pt on vent, no family present currently. CSW will follow and assist. CSW will meet with family later today or in the am.  Vennie Homans, Theresia Majors     12/21/2011 12:03 PM #[phone]

## 2011-12-21 NOTE — Progress Notes (Signed)
CARE MANAGEMENT NOTE 12/21/2011  Patient:  LYNWOOD, KUBISIAK   Account Number:  [id number]  Date Initiated:  12/21/2011  Documentation initiated by:  Daney Moor  Subjective/Objective Assessment:   pt with known diabetes noncompliatent,fell at home sustaining bilateral lower tib-fib fractures,admitted and reciveringon floor, had seizure and resp. arrest requiring intubataion     Action/Plan:   lives at  home unsure of needed outcome poss snf for rehab or ltac due to resp status   Anticipated DC Date:  12/24/2011   Anticipated DC Plan:  SKILLED NURSING FACILITY         Choice offered to / List presented to:             Status of service:  In process, will continue to follow Medicare Important Message given?   (If response is "NO", the following Medicare IM given date fields will be blank) Date Medicare IM given:   Date Additional Medicare IM given:    Discharge Disposition:    Per UR Regulation:  Reviewed for med. necessity/level of care/duration of stay  If discussed at Long Length of Stay Meetings, dates discussed:    Comments:  [id number]/Jaishon Krisher Lorrin Mais Case Management [phone]

## 2011-12-21 NOTE — Progress Notes (Addendum)
ANTICOAGULATION CONSULT NOTE - Follow Up Consult  Pharmacy Consult for IV Heparin Indication: NSTEMI  No Known Allergies  Patient Measurements: Height: 5\' 6"  (167.6 cm) Weight: 197 lb 8.5 oz (89.6 kg) IBW/kg (Calculated) : 63.8  Heparin Dosing Weight: 80 kg  Vital Signs: Temp: 102 F (38.9 C) (04/22 0400) Temp src: Oral (04/22 0400) BP: 105/46 mmHg (04/22 0346) Pulse Rate: 112  (04/22 0600)  Labs:  Basename 12/21/11 0410 12/20/11 2250 12/20/11 1948 12/20/11 1205 12/20/11 0610 12/20/11 0430 12/19/11 2040 12/19/11 1335 12/19/11 0346  HGB 7.4* 8.1* -- -- -- -- -- -- --  HCT 22.6* 24.4* 27.0* -- -- -- -- -- --  PLT 253 270 212 -- -- -- -- -- --  APTT -- -- -- -- -- 48* 29 -- --  LABPROT -- -- -- -- -- 18.1* 17.4* -- 14.7  INR -- -- -- -- -- 1.47 1.40 -- 1.13  HEPARINUNFRC 0.40 -- 0.32 0.19* -- -- -- -- --  CREATININE 1.21 1.06 1.14 -- -- -- -- -- --  CKTOTAL -- -- -- -- 972* -- 1658* 1478* --  CKMB -- -- -- -- 48.2* -- 261.2* 207.5* --  TROPONINI -- -- -- -- >25.00* -- >25.00* >25.00* --   Estimated Creatinine Clearance: 80.8 ml/min (by C-G formula based on Cr of 1.21).   Assessment:  45 yom on IV heparin for NSTEMI. Pt is s/p surgery for tibial fracture 4/19.  Heparin level therapeutic x 2 now.     Note that Hgb dropping - await Md orders if needed.  No bleeding/complications reported.   Goal of Therapy:  Heparin level 0.3-0.7 units/ml   Plan:   Continue IV heparin 1600 units/hr  Recheck heparin level in AM   Hessie Knows, PharmD, BCPS Pager [phone] 12/21/2011 7:15 AM   Addendum:   A/P: Upon further investigation, a rate change in the heparin was made when the level was 0.32 from 1450 to 1600 units/hr. So, only 1 heparin level has been therapeutic at the current rate of 1600 units/hr. Will recheck another level to verify current rate yields another therapeutic level.   Hessie Knows, PharmD, BCPS Pager [phone] 12/21/2011 3:00 PM

## 2011-12-22 ENCOUNTER — Inpatient Hospital Stay (HOSPITAL_COMMUNITY): Payer: Medicare Other

## 2011-12-22 DIAGNOSIS — E875 Hyperkalemia: Secondary | ICD-10-CM | POA: Diagnosis not present

## 2011-12-22 DIAGNOSIS — N179 Acute kidney failure, unspecified: Secondary | ICD-10-CM | POA: Diagnosis not present

## 2011-12-22 DIAGNOSIS — D62 Acute posthemorrhagic anemia: Secondary | ICD-10-CM

## 2011-12-22 DIAGNOSIS — K567 Ileus, unspecified: Secondary | ICD-10-CM

## 2011-12-22 LAB — ABO/RH: ABO/RH(D): A POS

## 2011-12-22 LAB — BLOOD GAS, ARTERIAL
Acid-base deficit: 5 mmol/L — ABNORMAL HIGH (ref 0.0–2.0)
Drawn by: 336861
FIO2: 0.4 %
RATE: 24 resp/min
pCO2 arterial: 50.5 mmHg — ABNORMAL HIGH (ref 35.0–45.0)
pH, Arterial: 7.249 — ABNORMAL LOW (ref 7.350–7.450)
pO2, Arterial: 79.5 mmHg — ABNORMAL LOW (ref 80.0–100.0)

## 2011-12-22 LAB — VANCOMYCIN, TROUGH: Vancomycin Tr: 47.8 ug/mL (ref 10.0–20.0)

## 2011-12-22 LAB — POCT I-STAT 3, ART BLOOD GAS (G3+)
Bicarbonate: 22.7 mEq/L (ref 20.0–24.0)
Bicarbonate: 21.5 mEq/L (ref 20.0–24.0)
O2 Saturation: 85 %
O2 Saturation: 98 %
O2 Saturation: 99 %
pCO2 arterial: 52.2 mmHg — ABNORMAL HIGH (ref 35.0–45.0)
pCO2 arterial: 55.1 mmHg — ABNORMAL HIGH (ref 35.0–45.0)
pCO2 arterial: 63.2 mmHg (ref 35.0–45.0)
pO2, Arterial: 60 mmHg — ABNORMAL LOW (ref 80.0–100.0)
pO2, Arterial: 129 mmHg — ABNORMAL HIGH (ref 80.0–100.0)
pO2, Arterial: 165 mmHg — ABNORMAL HIGH (ref 80.0–100.0)

## 2011-12-22 LAB — PROCALCITONIN: Procalcitonin: 17.52 ng/mL

## 2011-12-22 LAB — COMPREHENSIVE METABOLIC PANEL
AST: 73 U/L — ABNORMAL HIGH (ref 0–37)
Alkaline Phosphatase: 101 U/L (ref 39–117)
BUN: 40 mg/dL — ABNORMAL HIGH (ref 6–23)
CO2: 22 mEq/L (ref 19–32)
Chloride: 104 mEq/L (ref 96–112)
Creatinine, Ser: 4.2 mg/dL — ABNORMAL HIGH (ref 0.50–1.35)
GFR calc non Af Amer: 16 mL/min — ABNORMAL LOW (ref >90)
Potassium: 5.3 mEq/L — ABNORMAL HIGH (ref 3.5–5.1)
Total Bilirubin: 0.4 mg/dL (ref 0.3–1.2)

## 2011-12-22 LAB — URINE MICROSCOPIC-ADD ON

## 2011-12-22 LAB — GLUCOSE, CAPILLARY
Glucose-Capillary: 101 mg/dL — ABNORMAL HIGH (ref 70–99)
Glucose-Capillary: 110 mg/dL — ABNORMAL HIGH (ref 70–99)
Glucose-Capillary: 124 mg/dL — ABNORMAL HIGH (ref 70–99)
Glucose-Capillary: 132 mg/dL — ABNORMAL HIGH (ref 70–99)
Glucose-Capillary: 152 mg/dL — ABNORMAL HIGH (ref 70–99)
Glucose-Capillary: 159 mg/dL — ABNORMAL HIGH (ref 70–99)
Glucose-Capillary: 160 mg/dL — ABNORMAL HIGH (ref 70–99)
Glucose-Capillary: 180 mg/dL — ABNORMAL HIGH (ref 70–99)
Glucose-Capillary: 210 mg/dL — ABNORMAL HIGH (ref 70–99)
Glucose-Capillary: 280 mg/dL — ABNORMAL HIGH (ref 70–99)

## 2011-12-22 LAB — BASIC METABOLIC PANEL
Calcium: 7.8 mg/dL — ABNORMAL LOW (ref 8.4–10.5)
Chloride: 105 mEq/L (ref 96–112)
Creatinine, Ser: 4.91 mg/dL — ABNORMAL HIGH (ref 0.50–1.35)
GFR calc Af Amer: 15 mL/min — ABNORMAL LOW (ref >90)
GFR calc non Af Amer: 11 mL/min — ABNORMAL LOW (ref >90)
Glucose, Bld: 102 mg/dL — ABNORMAL HIGH (ref 70–99)
Potassium: 5.5 mEq/L — ABNORMAL HIGH (ref 3.5–5.1)
Sodium: 137 mEq/L (ref 135–145)

## 2011-12-22 LAB — URINALYSIS, ROUTINE W REFLEX MICROSCOPIC
Glucose, UA: 100 mg/dL — AB
Ketones, ur: NEGATIVE mg/dL
Nitrite: NEGATIVE
Protein, ur: 100 mg/dL — AB

## 2011-12-22 LAB — PREPARE RBC (CROSSMATCH)

## 2011-12-22 LAB — CBC
HCT: 21.8 % — ABNORMAL LOW (ref 39.0–52.0)
MCV: 93.2 fL (ref 78.0–100.0)
RBC: 2.34 MIL/uL — ABNORMAL LOW (ref 4.22–5.81)
WBC: 12.5 10*3/uL — ABNORMAL HIGH (ref 4.0–10.5)

## 2011-12-22 LAB — CREATININE, URINE, RANDOM: Creatinine, Urine: 183.1 mg/dL

## 2011-12-22 LAB — CULTURE, RESPIRATORY W GRAM STAIN

## 2011-12-22 LAB — UREA NITROGEN, URINE: Urea Nitrogen, Ur: 102 mg/dL

## 2011-12-22 MED ORDER — CISATRACURIUM BOLUS VIA INFUSION
10.0000 mg | Freq: Once | INTRAVENOUS | Status: DC
  Filled 2011-12-22 (×3): qty 10

## 2011-12-22 MED ORDER — PIPERACILLIN-TAZOBACTAM IN DEX 2-0.25 GM/50ML IV SOLN
2.2500 g | Freq: Three times a day (TID) | INTRAVENOUS | Status: DC
  Administered 2011-12-22 – 2011-12-23 (×3): 2.25 g via INTRAVENOUS
  Filled 2011-12-22 (×7): qty 50

## 2011-12-22 MED ORDER — SODIUM CHLORIDE 0.9 % IV SOLN
INTRAVENOUS | Status: DC
  Administered 2011-12-22: 5.1 [IU]/h via INTRAVENOUS
  Filled 2011-12-22 (×2): qty 1

## 2011-12-22 MED ORDER — HEPARIN BOLUS VIA INFUSION (CRRT)
1000.0000 [IU] | INTRAVENOUS | Status: DC | PRN
  Administered 2011-12-23 (×2): 1000 [IU] via INTRAVENOUS_CENTRAL
  Filled 2011-12-22: qty 1000

## 2011-12-22 MED ORDER — HEPARIN (PORCINE) 2000 UNITS/L FOR CRRT
INTRAVENOUS_CENTRAL | Status: DC | PRN
  Filled 2011-12-22: qty 1000

## 2011-12-22 MED ORDER — HEPARIN SODIUM (PORCINE) 5000 UNIT/ML IJ SOLN
5000.0000 [IU] | Freq: Three times a day (TID) | INTRAMUSCULAR | Status: DC
  Administered 2011-12-23 – 2012-01-07 (×43): 5000 [IU] via SUBCUTANEOUS
  Filled 2011-12-22 (×53): qty 1

## 2011-12-22 MED ORDER — HEPARIN SODIUM (PORCINE) 1000 UNIT/ML IJ SOLN
4000.0000 [IU] | Freq: Once | INTRAMUSCULAR | Status: AC
  Filled 2011-12-22: qty 4

## 2011-12-22 MED ORDER — SODIUM BICARBONATE 8.4 % IV SOLN
INTRAVENOUS | Status: DC
  Administered 2011-12-23: 01:00:00 via INTRAVENOUS
  Filled 2011-12-22 (×3): qty 150

## 2011-12-22 MED ORDER — SODIUM CHLORIDE 0.9 % IJ SOLN
250.0000 [IU]/h | INTRAMUSCULAR | Status: DC
  Administered 2011-12-23: 1350 [IU]/h via INTRAVENOUS_CENTRAL
  Administered 2011-12-23: 1600 [IU]/h via INTRAVENOUS_CENTRAL
  Administered 2011-12-24: 1800 [IU]/h via INTRAVENOUS_CENTRAL
  Administered 2011-12-24: 1650 [IU]/h via INTRAVENOUS_CENTRAL
  Administered 2011-12-24: 1600 [IU]/h via INTRAVENOUS_CENTRAL
  Administered 2011-12-24: 1800 [IU]/h via INTRAVENOUS_CENTRAL
  Filled 2011-12-22 (×10): qty 2

## 2011-12-22 MED ORDER — PRISMASOL BGK 4/2.5 32-4-2.5 MEQ/L IV SOLN
INTRAVENOUS | Status: DC
  Administered 2011-12-23: 07:00:00 via INTRAVENOUS_CENTRAL
  Filled 2011-12-22 (×7): qty 5000

## 2011-12-22 MED ORDER — NOREPINEPHRINE BITARTRATE 1 MG/ML IJ SOLN
2.0000 ug/min | INTRAVENOUS | Status: DC
  Administered 2011-12-22: 2 ug/min via INTRAVENOUS
  Administered 2011-12-23: 7 ug/min via INTRAVENOUS
  Filled 2011-12-22 (×4): qty 4

## 2011-12-22 MED ORDER — SODIUM CHLORIDE 0.9 % IJ SOLN
INTRAMUSCULAR | Status: AC
  Filled 2011-12-22: qty 3

## 2011-12-22 MED ORDER — SODIUM CHLORIDE 0.9 % IV SOLN
3.0000 ug/kg/min | INTRAVENOUS | Status: DC
  Administered 2011-12-22: 3 ug/kg/min via INTRAVENOUS
  Administered 2011-12-23: 6 ug/kg/min via INTRAVENOUS
  Administered 2011-12-23: 5 ug/kg/min via INTRAVENOUS
  Administered 2011-12-23: 6.007 ug/kg/min via INTRAVENOUS
  Filled 2011-12-22 (×8): qty 20

## 2011-12-22 MED ORDER — HEPARIN SODIUM (PORCINE) 1000 UNIT/ML DIALYSIS
1000.0000 [IU] | INTRAMUSCULAR | Status: DC | PRN
  Administered 2011-12-26 (×2): 1200 [IU] via INTRAVENOUS_CENTRAL
  Administered 2011-12-27: 2400 [IU] via INTRAVENOUS_CENTRAL
  Filled 2011-12-22 (×2): qty 3
  Filled 2011-12-22: qty 6

## 2011-12-22 MED ORDER — STERILE WATER FOR INJECTION IV SOLN
INTRAVENOUS | Status: DC
  Administered 2011-12-23 (×3): via INTRAVENOUS_CENTRAL
  Filled 2011-12-22 (×12): qty 150

## 2011-12-22 MED ORDER — SODIUM BICARBONATE 8.4 % IV SOLN
INTRAVENOUS | Status: DC
  Administered 2011-12-23 (×4): via INTRAVENOUS_CENTRAL
  Filled 2011-12-22 (×12): qty 150

## 2011-12-22 MED ORDER — PANTOPRAZOLE SODIUM 40 MG IV SOLR
40.0000 mg | INTRAVENOUS | Status: DC
  Administered 2011-12-22 – 2011-12-26 (×5): 40 mg via INTRAVENOUS
  Filled 2011-12-22 (×7): qty 40

## 2011-12-22 NOTE — Progress Notes (Signed)
eLink Physician-Brief Progress Note Patient Name: Reginald Gutierrez DOB: Jul 27, 1966 MRN: [id number]  Date of Service  12/22/2011   HPI/Events of Note  Hb 6.8   eICU Interventions  Hold heparin, transfuse 1 U, guiiac stool   Intervention Category Intermediate Interventions: Bleeding - evaluation and treatment with blood products  Rafael Salway V. 12/22/2011, 5:07 AM

## 2011-12-22 NOTE — Consult Note (Signed)
Reginald Gutierrez 12/22/2011 Dmitry Macomber D Requesting Physician:  Dr. Craige Cotta  Reason for Consult:  Acute renal failure HPI: The patient is a 46 y.o. year-old WM with type I DM since childhood admitted on 12/18/2011 with AMS, fall and bilateral LE tibial fractures. He had HTN and was taking lisinopril. Hospital course complicated by NSTEMI and also extensive bilat PNA felt to be due to aspiration, with vent-dependent resp failure. Creatinine stable up until yesterday up slightly to 1.20 and then today is up to 4.20. UOP over last 36 hours has been less than 11 cc/hr. He rec'd 1 dose of the ACEI on admission then that was stopped.  He did receive IV contrast with a chest CT angio on 4/21 with 100 cc of iohexol. Patient provides no history, sedated on the ventilator. BP has been low the last 36 hours in the 80's at worst. Today BP up 120's, not on pressors. Is getting scheduled IV lopressor every 6 hrs for NSTEMI.   Creatinine, Ser  Date/Time Value Range Status  12/22/2011  4:15 AM 4.20* 0.50-1.35 (mg/dL) Final     DELTA CHECK NOTED     REPEATED TO VERIFY  12/21/2011  4:10 AM 1.21  0.50-1.35 (mg/dL) Final  4/54/0981 19:14 PM 1.06  0.50-1.35 (mg/dL) Final  7/82/9562  1:30 PM 1.14  0.50-1.35 (mg/dL) Final  8/65/7846  9:62 PM 0.99  0.50-1.35 (mg/dL) Final  9/52/8413  2:44 AM 1.00  0.50-1.35 (mg/dL) Final  0/05/2724  3:66 AM 0.99  0.50-1.35 (mg/dL) Final  4/40/3474  2:59 AM 1.05  0.50-1.35 (mg/dL) Final  5/63/8756  4:33 PM 1.08  0.50-1.35 (mg/dL) Final  2/95/1884  1:66 PM 1.32  0.50-1.35 (mg/dL) Final  0/63/0160  1:09 PM 1.43* 0.50-1.35 (mg/dL) Final     DELTA CHECK NOTED     REPEATED TO VERIFY  12/19/2011  3:46 AM 0.94  0.50-1.35 (mg/dL) Final  11/21/5571  2:20 PM 0.69  0.50-1.35 (mg/dL) Final  2/54/2706  2:37 PM 0.77  0.40-1.50 (mg/dL) Final  02/25/3150  7:61 PM 0.80  0.4-1.5 (mg/dL) Final    Past Medical History:  Past Medical History  Diagnosis Date  . Diabetes mellitus type 1   . Hyperlipidemia    . Hypothyroidism   . Depression   . Umbilical hernia   . Hypertension     Past Surgical History:  Past Surgical History  Procedure Date  . Appendectomy     open    Family History: History reviewed. No pertinent family history. Social History:  reports that he has been smoking Cigarettes.  He has been smoking about .5 packs per day. He has never used smokeless tobacco. He reports that he does not drink alcohol or use illicit drugs.  Allergies: No Known Allergies  Home medications: Prior to Admission medications   Medication Sig Start Date End Date Taking? Authorizing Provider  atorvastatin (LIPITOR) 40 MG tablet Take 40 mg by mouth daily.    Yes Historical Provider, MD  buPROPion (WELLBUTRIN SR) 150 MG 12 hr tablet Take 150 mg by mouth daily.   Yes Historical Provider, MD  insulin glargine (LANTUS) 100 UNIT/ML injection Inject 25 Units into the skin at bedtime.    Yes Historical Provider, MD  insulin lispro (HUMALOG) 100 UNIT/ML injection Inject 5-10 Units into the skin 3 (three) times daily before meals.    Yes Historical Provider, MD  levothyroxine (SYNTHROID, LEVOTHROID) 200 MCG tablet Take 200 mcg by mouth daily.     Yes Historical Provider, MD  lisinopril (PRINIVIL,ZESTRIL) 40  MG tablet Take 40 mg by mouth daily. For blood pressure   Yes Historical Provider, MD    Inpatient medications:    . antiseptic oral rinse  15 mL Mouth Rinse QID  . chlorhexidine  15 mL Mouth Rinse BID  . furosemide  160 mg Intravenous Once  . furosemide  60 mg Intravenous Once  . metoprolol  2.5 mg Intravenous Q6H  . piperacillin-tazobactam (ZOSYN)  IV  3.375 g Intravenous Q8H  . sodium chloride  3 mL Intravenous Q12H  . sodium chloride      . DISCONTD: atorvastatin  40 mg Oral q1800  . DISCONTD: clopidogrel  75 mg Oral Q breakfast  . DISCONTD: feeding supplement (JEVITY 1.2 CAL)  1,000 mL Per Tube Q24H  . DISCONTD: insulin aspart  0-10 Units Subcutaneous Q4H  . DISCONTD: insulin aspart  5  Units Intravenous Once  . DISCONTD: insulin glargine  38 Units Subcutaneous Q24H  . DISCONTD: pantoprazole sodium  40 mg Per Tube Q1200  . DISCONTD: vancomycin  1,250 mg Intravenous Q12H  . DISCONTD: vancomycin  1,000 mg Intravenous Q8H    Review of Systems Not available.   Physical Exam:  Blood pressure 127/64, pulse 116, temperature 99.8 F (37.7 C), temperature source Oral, resp. rate 31, height 5\' 6"  (1.676 m), weight 96 kg (211 lb 10.3 oz), SpO2 91.00%.  Gen: on vent sedated Skin: no rash, cyanosis Neck: no JVD, bruits or LAN Chest: coarse BS, no fluid in the ETT Heart: regular, no rub or gallop Abdomen: soft, nondistended, + BS Ext: diffuse nonpitting edema 2+ of all extremities Neuro: alert, Ox3, no focal deficit Heme/Lymph: no bruising or LAN  Labs: Basic Metabolic Panel:  Lab 12/22/11 1610 12/21/11 0410 12/20/11 2250 12/20/11 1948 12/20/11 1543 12/20/11 0945 12/20/11 0610 12/19/11 0346  NA 133* 137 139 138 137 136 137 --  K 5.3* 4.3 4.0 3.8 4.1 3.7 3.5 --  CL 104 107 109 107 105 104 107 --  CO2 22 24 25 24 23 21 22  --  GLUCOSE 355* 208* 165* 225* 244* 224* 157* --  BUN 40* 20 17 18 15 15 16  --  CREATININE 4.20* 1.21 1.06 1.14 0.99 1.00 0.99 --  ALB -- -- -- -- -- -- -- --  CALCIUM 7.7* 7.5* 7.6* 7.7* 7.6* 7.5* 7.8* --  PHOS -- -- -- -- -- -- -- 4.1   Liver Function Tests:  Lab 12/22/11 0415 12/21/11 0410 12/20/11 0610  AST 73* 104* 212*  ALT 59* 59* 83*  ALKPHOS 101 83 88  BILITOT 0.4 0.5 0.9  PROT 5.2* 4.7* 4.6*  ALBUMIN 2.0* 2.0* 2.2*   No results found for this basename: LIPASE:3,AMYLASE:3 in the last 168 hours No results found for this basename: AMMONIA:3 in the last 168 hours CBC:  Lab 12/22/11 0415 12/21/11 0410 12/20/11 2250 12/20/11 1948 12/20/11 0430 12/19/11 0346 12/18/11 1410  WBC 12.5* 12.0* 12.5* 10.9* -- -- --  NEUTROABS -- 9.0* -- -- 7.4 12.6* 16.1*  HGB 6.8* 7.4* 8.1* 8.7* -- -- --  HCT 21.8* 22.6* 24.4* 27.0* -- -- --  MCV 93.2 90.0  89.4 89.7 -- -- --  PLT 249 253 270 212 -- -- --   PT/INR: @labrcntip (inr:5) Cardiac Enzymes:  Lab 12/20/11 0610 12/19/11 2040 12/19/11 1335 12/19/11 0346  CKTOTAL 972* 1658* 1478* 488*  CKMB 48.2* 261.2* 207.5* 13.5*  CKMBINDEX -- -- -- --  TROPONINI >25.00* >25.00* >25.00* 0.79*   CBG:  Lab 12/22/11 0316 12/21/11 2357 12/21/11 1940  12/21/11 1732 12/21/11 1702  GLUCAP 346* 280* 193* 161* 164*    Iron Studies: No results found for this basename: IRON:30,TIBC:30,TRANSFERRIN:30,FERRITIN:30 in the last 168 hours  Xrays/Other Studies: Ct Abdomen Pelvis Wo Contrast  12/22/2011  *RADIOLOGY REPORT*  Clinical Data: Patient found unresponsive with bilateral lower leg fractures.  Shortness of breath and hypoxia.  Abdominal distention. Question retroperitoneal hematoma.  CT ABDOMEN AND PELVIS WITHOUT CONTRAST  Technique:  Multidetector CT imaging of the abdomen and pelvis was performed following the standard protocol without intravenous contrast.  Comparison: CT chest 12/19/2011.  Findings: Right greater than left pleural effusions persist and appear slightly increased.  There has been interval worsening of bilateral lower lobe airspace disease, greater on the right.  Air bronchograms are identified.  No pericardial effusion.  NG tube is in place with the tip at the ligament of Treitz.  The gallbladder, liver, spleen, adrenal glands, pancreas and kidneys appear normal.  There is no hematoma or other fluid collection within the abdomen.  Foley catheter is in place in a decompressed urinary bladder.  Body wall edema is noted.  There is no evidence of bowel obstruction. Fat containing ventral hernia is noted.  No lymphadenopathy.  No focal bony abnormality.  IMPRESSION:  1.  Negative for retroperitoneal hematoma or other acute abnormality. 2.  Increased bilateral pleural effusions and basilar airspace disease since chest CT 12/19/2011.  Original Report Authenticated By: Bernadene Bell. Maricela Curet, M.D.   US  Renal Port  12/22/2011  *RADIOLOGY REPORT*  Clinical Data: Acute renal failure  RENAL/URINARY TRACT ULTRASOUND COMPLETE  Comparison:  None.  Findings:  Examination is minimally degraded secondary to patient body habitus and medical state.  Right Kidney:  Normal cortical thickness, echogenicity and size, measuring 12.0 cm in length.  No focal renal lesions.  No echogenic renal stones.  No urinary obstruction.  Left Kidney:  Normal cortical thickness, echogenicity and size, measuring 12.4 cm in length.  No focal renal lesions.  No echogenic renal stones.  No urinary obstruction.  Bladder:  Decompressed with a Foley catheter.  IMPRESSION: No explanation for patient's acute renal failure, specifically, no evidence of hydronephrosis.  Original Report Authenticated By: Waynard Reeds, M.D.   Dg Chest Port 1 View  12/22/2011  *RADIOLOGY REPORT*  Clinical Data: Evaluate endotracheal tube  PORTABLE CHEST - 1 VIEW  Comparison: 12/21/2011; 12/20/2011; chest CT - 12/20/2011  Findings: Grossly unchanged cardiac silhouette and mediastinal contours gave an decreased lung volumes and patient rotation. Stable position of support apparatus.  The pulmonary vasculature remains indistinct.  Grossly unchanged bilateral mid lung heterogeneous air space opacities, right greater than left.  Likely unchanged small bilateral pleural effusions are suspected.  No definite pneumothorax.  Grossly unchanged bones.  IMPRESSION: 1.  Stable positioning of support apparatus.  No pneumothorax. 2.  Grossly unchanged findings of pulmonary edema and bilateral mid lung heterogeneous opacities, right greater than left, atelectasis versus infiltrate.  Original Report Authenticated By: Waynard Reeds, M.D.   Dg Chest Port 1 View  12/21/2011  *RADIOLOGY REPORT*  Clinical Data: Check ETT  PORTABLE CHEST - 1 VIEW  Comparison: CT chest dated 12/20/2011  Findings: Multifocal patchy opacities, right greater than left, possibly reflecting pneumonia or  aspiration.  Underlying interstitial edema is suspected.  Cardiomegaly with small bilateral pleural effusions.  No pneumothorax.  Endotracheal tube terminates 2 cm above the carina.  Enteric tube courses below the diaphragm.  Stable right IJ venous catheter.  IMPRESSION: Multifocal patchy opacities, right  greater than left, possibly reflecting pneumonia or aspiration.  Underlying interstitial edema is suspected.  Cardiomegaly with small bilateral pleural effusions.  Endotracheal tube terminates 2 cm above the carina.  Original Report Authenticated By: Charline Bills, M.D.   Dg Abd Portable 1v  12/22/2011  *RADIOLOGY REPORT*  Clinical Data: Abdominal distension  PORTABLE ABDOMEN - 1 VIEW  Comparison: Chest radiograph - 12/22/2011  Findings:  Limited visualization of the lower thorax demonstrates improved inspiration with persistent bilateral perihilar heterogeneous opacities, right greater than left.  Persistent mild elevation right hemidiaphragm.  No definite pleural effusion.  There is mild gaseous distension of a rather featureless loop of likely small bowel within the right mid hemiabdomen measuring approximately 4.3 cm in diameter. Minimal distal colonic gas identified.  Redundant enteric tubing is coiled within the stomach. No supine evidence of pneumoperitoneum pneumatosis.  Lumbar spine degenerative change.  Support apparatus overlies the right thigh.  IMPRESSION:  1.  Mild dilatation of a loop of likely small bowel in the right mid hemiabdomen with relative paucity of distal colonic gas may represent a partial small bowel obstruction.  Pain attention on follow-up is recommended.  2.  Support apparatus overlies the right upper thigh and may be external to the patient.  If this is a central venous catheter, it is pointing peripherally.  Clinical correlation is advised.  This was made a call report.  Original Report Authenticated By: Waynard Reeds, M.D.   UA showed 10-20 WBC and 20-50 RBC's, +  bacteria, 100 prot Renal US was unremarkable ECHO EF 25-30% Assessment/Recommendations 1. AKI due to ATN from combination of IV dye and hypotension/shock related to acute illness. No indication for acute RRT, but could head in that direction over the next few days. Would limit K+ intake and d/c IVF's. He is vol overloaded. Agree with checking vanc level, low threshold to d/c given AKI.  2. Acute NSTEMI with ++ CE's and wall motion abnormalities on ECHO 3. TYPE I DM 4. Asp pneumonia 5. VDRF on full support 6. S/p fall with bilat LE tibial fracture 7. Mild hyperkalemia 8. Hx HTN- bp meds on hold due to low bp's   Thanks for the referral, will follow.   Vinson Moselle  MD [facility] [phone] pgr    [phone] cell 12/22/2011, 12:15 PM

## 2011-12-22 NOTE — Progress Notes (Signed)
Patient ID: Reginald Gutierrez, male   DOB: Apr 20, 1966, 46 y.o.   MRN: [id number] Physician Discharge Summary  Patient ID: Reginald Gutierrez MRN: [id number] DOB/AGE: 1965/11/10 46 y.o.  Admit date: 12/18/2011 Discharge date: 12/22/2011  Admission Diagnoses: Leg fracture   Diagnoses at time of transfer:  Active Problems:  HYPOTHYROIDISM  DIABETES MELLITUS, TYPE I  HYPERCHOLESTEROLEMIA  DEPRESSION  Tibial fracture  NSTEMI (non-ST elevated myocardial infarction)  Acute blood loss anemia  Acute respiratory failure  Aspiration pneumonia  Metabolic encephalopathy  Protein calorie malnutrition  Acute renal failure  Hyperkalemia  Ileus  Patient Details:    Reginald Gutierrez is an 46 y.o. male.  History of Present Illness: 46 yr old type 1 DM, prior coc abuse admitted with bilateral tibia Fx s/p syncopal episode. To OR 4/19 for repair of tibai Fx. In am 4/20 acute SOB, hypoxia, confusin, hypoxia and refractory Shock. To ICU, confused, some tan secretions. No CP. Had small rise trop noted, ECG with resolving ST depression. Found to be acidotic , hypoxic required emergent intubation. Pressors treated and A line require.Unclear etiology.   Lines / Drains:  4/20 ETT>>>  4/20 line rt ij>>>  4/20 Aline rt fem>>>   Cultures:  BC 4/20>>>  Sputum 4/20>>>NF   Abx:  Zosyn 4/20>>>  vanc 4/20>>>   Tests / Events:  4/19- repair bilat tibia Fx  4/20- refractory shock, hypoxic resp failure., intubated  4/20- bilateral infiltrates bases rt greaterleft  4/20 CT head - neg acute  4/20- hep started, CT chest spiral contrast avoided secondary to ARF, crt rise  4/20 Echo>>EF 25 to 30%, akinesis of mid/distal anteroseptal and apical areas, mild MR, mild reduction RV systolic fx 4/21 CT angio chest: No central pulmonary embolism. Slight progression of right greater than left airspace disease. 4/23 Renal US>>> prelim: neg 4/23 Fractional Urea>>>  4/23 Abd pressures>>>  4/23: CT abd: no RPH, no  obstruction  Subjective/interval  Marked rise in creatinine overnight. Now oliguric even after diuretic challenge. Hgb drifting down, No obvious bleeding, but abd is tight and distended.   Vital Signs:  BP 127/64  Pulse 116  Temp(Src) 99.8 F (37.7 C) (Oral)  Resp 31  Ht 5\' 6"  (1.676 m)  Wt 96 kg (211 lb 10.3 oz)  BMI 34.16 kg/m2  SpO2 91%  Physical Examination:  General: sedated currently, no distress  Neuro: Nonfocal, equal power, perrl 3 mm  HEENT: obese  Neck: Obese, right IJ unremarkable  Cardiovascular: s1 s2 rrt reduced  Lungs: Rhonchi diffuse  Abdomen: Soft, BS wnl no r, reducible hernia  Musculoskeletal: bilat lower ext casts  Skin: No rash  Assessment and Plan:  Hypoxic Acute resp failure in setting of R>L bilateral infiltrates: most likely due to RLL PNA, possible aspiration and what may be evolving ALI vs edema. Fat emboli also still remains a possibility, CT chest was neg for VTE. CXR on 4/22 showing R>L bilateral airspace disease.  Plan:  -ARDS protocol  -sedation protocol  -scheduled BDs  -f/u CXR and abg  -cont HCAP coverage, cont broad spec abx  -no wean today   NSTEMI ? The primary event given "syncopal episode" or possibly related to acute illness and representing a "stress test" Has significant systolic heart failure.   Lab 12/20/11 0610 12/19/11 2040 12/19/11 1335  CKTOTAL 972* 1658* 1478*  CKMB 48.2* 261.2* 207.5*  TROPONINI >25.00* >25.00* >25.00*  Plan:  -cont b blocker rx as BP allows -holding asa, heparin and plavix due to acute  renal failure   Acute renal failure: Probably due to CT contrast and Hypotension  Recent Labs  [facility] 12/22/11 1132 12/22/11 0415 12/21/11 0410   CREATININE 4.91* 4.20* 1.21  plan: -maintain hemodynamics -follow up creatinine -move to [facility] in case he needs HD. Hyperkalemia may factor that pushes this.   Acute anemia. No clear evidence of bleeding, CT negative for [facility]   Lab 12/22/11 0415 12/21/11 0410  12/20/11 2250  HGB 6.8* 7.4* 8.1*  Plan:  -heme occult stools  -hold heparin, asa and plavix   Hyperkalemia   Lab 12/22/11 1132 12/22/11 0415 12/21/11 0410  K 5.1 5.3* 4.3  plan:  -recheck, may need HD if this is real   Fever/leukocytosis/ sepsis: presume this is d/t PNA.   Lab 12/22/11 0415 12/21/11 0410 12/20/11 2250 12/20/11 1948 12/20/11 0945 12/19/11 1335 12/19/11 1330  PROCALCITON 17.52 -- -- -- 7.20 -- 8.56  WBC 12.5* 12.0* 12.5* 10.9* -- -- --  LATICACIDVEN -- -- -- -- 2.9* 3.8* --  Plan:  -re-culture  -cont current abx   Change in MS/ acute encephalopathy: metabolic enceph vs fat emboli  Plan  -supportive care   DM, hyperglycemia  CBG (last 3)   Basename 12/22/11 0316 12/21/11 2357 12/21/11 1940  GLUCAP 346* 280* 193*  plan:  -cont insulin gtt   sick euthyroid  Plan:  Synthroid home med, held ( in setting tachy,. MI) reduce in future   Tibial fx  -hep tolerated thus far  -etiology of breaks? Still a mystery,  Updated ortho, they are ok with heparin if life saving of course   Ileus 4/23 Plan: -rest gut -hold tube feeds -NGT to LIWS  Malnutrition  -cont tubefeeds.    Labs at time of transfer Lab Results  Component Value Date   CREATININE 4.91* 12/22/2011   BUN 42* 12/22/2011   NA 138 12/22/2011   K 5.1 12/22/2011   CL 107 12/22/2011   CO2 24 12/22/2011   Lab Results  Component Value Date   WBC 12.5* 12/22/2011   HGB 6.8* 12/22/2011   HCT 21.8* 12/22/2011   MCV 93.2 12/22/2011   PLT 249 12/22/2011   Lab Results  Component Value Date   ALT 59* 12/22/2011   AST 73* 12/22/2011   ALKPHOS 101 12/22/2011   BILITOT 0.4 12/22/2011   Lab Results  Component Value Date   INR 1.47 12/20/2011   INR 1.40 12/19/2011   INR 1.13 12/19/2011    Disposition: [facility]  Scheduled Meds:   . antiseptic oral rinse  15 mL Mouth Rinse QID  . chlorhexidine  15 mL Mouth Rinse BID  . furosemide  160 mg Intravenous Once  . furosemide  60 mg Intravenous Once  . metoprolol   2.5 mg Intravenous Q6H  . pantoprazole (PROTONIX) IV  40 mg Intravenous Q24H  . piperacillin-tazobactam (ZOSYN)  IV  2.25 g Intravenous Q8H  . sodium chloride  3 mL Intravenous Q12H  . sodium chloride      . DISCONTD: atorvastatin  40 mg Oral q1800  . DISCONTD: clopidogrel  75 mg Oral Q breakfast  . DISCONTD: feeding supplement (JEVITY 1.2 CAL)  1,000 mL Per Tube Q24H  . DISCONTD: insulin aspart  0-10 Units Subcutaneous Q4H  . DISCONTD: insulin aspart  5 Units Intravenous Once  . DISCONTD: insulin glargine  38 Units Subcutaneous Q24H  . DISCONTD: pantoprazole sodium  40 mg Per Tube Q1200  . DISCONTD: piperacillin-tazobactam (ZOSYN)  IV  3.375 g Intravenous Q8H  .  DISCONTD: vancomycin  1,250 mg Intravenous Q12H  . DISCONTD: vancomycin  1,000 mg Intravenous Q8H   Continuous Infusions:   . fentaNYL infusion INTRAVENOUS Stopped (12/22/11 1315)  . insulin (NOVOLIN-R) infusion 4.6 Units/hr (12/22/11 0855)  . midazolam (VERSED) infusion Stopped (12/22/11 0745)  . DISCONTD: sodium chloride 50 mL/hr at 12/22/11 0800  . DISCONTD: dextrose    . DISCONTD: heparin 1,600 Units/hr (12/22/11 0100)  . DISCONTD: insulin (NOVOLIN-R) infusion 4.9 Units/hr (12/21/11 1150)   PRN Meds:.fentaNYL, midazolam, ondansetron (ZOFRAN) IV, DISCONTD: acetaminophen (TYLENOL) oral liquid 160 mg/5 mL, DISCONTD: ondansetron    Discharged Condition: critical  Signed: BABCOCK,PETE 12/22/2011, 12:21 PM   Attending Addendum:  I have seen the patient, discussed the issues, test results and plans with Kreg Shropshire, NP. I agree with the Assessment and Plans as ammended above.   Levy Pupa, MD, PhD 12/22/2011, 4:20 PM [facility] [phone] or if no answer [phone]

## 2011-12-22 NOTE — Progress Notes (Signed)
eLink Physician-Brief Progress Note Patient Name: Reginald Gutierrez DOB: 1966-01-22 MRN: [id number]  Date of Service  12/22/2011   HPI/Events of Note   Pt with respiratory and metabolic acidosis  eICU Interventions  Start NaHCO3 drip,  Will need HD or CVVH soon   Intervention Category Major Interventions: Acid-Base disturbance - evaluation and management  Shan Levans 12/22/2011, 9:15 PM

## 2011-12-22 NOTE — Progress Notes (Signed)
Name: Reginald Gutierrez MRN: [id number] DOB: 26-May-1966    LOS: 4  PCCM PROGRESS NOTE  History of Present Illness: 46 yr old type 1 DM, prior coc abuse admitted with bilateral tibia Fx s/p syncopal episode. To OR 4/19 for repair of tibai Fx. In am 4/20 acute SOB, hypoxia, confusin, hypoxia and refractory Shock. To ICU, confused, some tan secretions. No CP. Had small rise trop noted, ECG with resolving ST depression. Found to be acidotic , hypoxic required emergent intubation. Pressors treated and A line require.Unclear etiology.  Lines / Drains: 4/20 ETT>>> 4/20 line rt ij>>> 4/20 Aline rt fem>>>  Cultures: BC 4/20>>> Sputum 4/20>>>NF  Abx: Zosyn 4/20>>> vanc 4/20>>>  Tests / Events: 4/19- repair bilat tibia Fx 4/20- refractory shock, hypoxic resp failure., intubated 4/20- bilateral infiltrates bases rt greaterleft 4/20 CT head - neg acute 4/20- hep started, CT chest spiral contrast avoided secondary to ARF, crt rise 4/20 Echo>>EF 25 to 30%, akinesis of mid/distal anteroseptal and apical areas, mild MR, mild reduction RV systolic fx 4/21 CT angio chest: No central pulmonary embolism. Slight progression of right greater than left airspace disease. 4/23 Renal US>>> 4/23 Fractional Urea>>> 4/23 Abd pressures>>>  Subjective/interval Marked rise in creatinine overnight. Now oliguric even after diuretic challenge. Hgb drifting down, No obvious bleeding, but abd is tight and distended.   Vital Signs: BP 85/48  Pulse 114  Temp(Src) 98 F (36.7 C) (Axillary)  Resp 14  Ht 5\' 6"  (1.676 m)  Wt 96 kg (211 lb 10.3 oz)  BMI 34.16 kg/m2  SpO2 98% .4 CVP:  [22 mmHg-32 mmHg] 22 mmHg      . sodium chloride 50 mL/hr at 12/22/11 0700  . fentaNYL infusion INTRAVENOUS Stopped (12/22/11 0745)  . insulin (NOVOLIN-R) infusion 4.6 Units/hr (12/22/11 0855)  . midazolam (VERSED) infusion Stopped (12/22/11 0745)    I/O last 3 completed shifts: In: 5664.1 [I.V.:2664.1; NG/GT:2140; IV  Piggyback:860] Out: 352 [Urine:352]  Physical Examination: General: sedated currently, no distress Neuro:  Nonfocal, equal power, perrl 3 mm HEENT:  obese Neck:  Obese, right IJ unremarkable Cardiovascular:  s1 s2 rrt reduced Lungs:  Rhonchi diffuse Abdomen:  Soft, BS wnl no r, reducible hernia Musculoskeletal: bilat lower ext  casts Skin:  No rash  Ventilator settings: Vent Mode:  [-] PRVC FiO2 (%):  [30 %-40 %] 30 % Set Rate:  [20 bmp-30 bmp] 30 bmp Vt Set:  [380 mL-450 mL] 380 mL PEEP:  [5 cmH20-8 cmH20] 5 cmH20 Plateau Pressure:  [21 cmH20-29 cmH20] 25 cmH20  Labs and Imaging:   Lab 12/22/11 0415 12/21/11 0410 12/20/11 2250  NA 133* 137 139  K 5.3* 4.3 4.0  CL 104 107 109  CO2 22 24 25   BUN 40* 20 17  CREATININE 4.20* 1.21 1.06  GLUCOSE 355* 208* 165*    Lab 12/22/11 0415 12/21/11 0410 12/20/11 2250  HGB 6.8* 7.4* 8.1*  HCT 21.8* 22.6* 24.4*  WBC 12.5* 12.0* 12.5*  PLT 249 253 270    PCXR: personally reviewed. No sig change in R>L airspace disease w/ right basilar infiltrate vs atx  Assessment and Plan:  Hypoxic Acute resp failure in setting of R>L bilateral infiltrates: most likely due to RLL PNA, possible aspiration and what may be evolving ALI vs edema.  Fat emboli also still remains a possibility, CT chest was neg for VTE. CXR on 4/22 showing R>L bilateral airspace disease. Plan: -ARDS protocol -sedation protocol -scheduled BDs -f/u CXR and abg -cont HCAP coverage, cont broad  spec abx -no wean today  NSTEMI ? The primary event given "syncopal episode" or possibly related to acute illness and representing a "stress test"  Has significant systolic heart failure.   Lab 12/20/11 0610 12/19/11 2040 12/19/11 1335  CKTOTAL 972* 1658* 1478*  CKMB 48.2* 261.2* 207.5*  TROPONINI >25.00* >25.00* >25.00*  Plan: -cont b blocker rx -holding asa, heparin and plavix due to acute renal failure  Acute renal failure: had only isolated hypotension on 4/22, doubt  that this is a pre-renal process. Worried about nephro-toxin vs obstruction. Also w/ abd distention would be concerned about increased abd compartment pressure Recent Labs  Basename 12/22/11 0415 12/21/11 0410 12/20/11 2250   CREATININE 4.20* 1.21 1.06  plan: -check fractional urea -check renal US -check abd pressures to r/o abd compartment -Nephrology consult -renal dosing of meds.   Acute anemia. No clear evidence of bleeding. Abd is distended and firm so worry about [facility]   Lab 12/22/11 0415 12/21/11 0410 12/20/11 2250  HGB 6.8* 7.4* 8.1*  Plan: -heme occult stools -hold heparin, asa and plavix -may need CT abd  Hyperkalemia   Lab 12/22/11 0415 12/21/11 0410 12/20/11 2250  K 5.3* 4.3 4.0  plan: -recheck, may need HD if this is real   Fever/leukocytosis/ sepsis: presume this is d/t PNA.    Lab 12/22/11 0415 12/21/11 0410 12/20/11 2250 12/20/11 1948 12/20/11 0945 12/19/11 1335 12/19/11 1330  PROCALCITON 17.52 -- -- -- 7.20 -- 8.56  WBC 12.5* 12.0* 12.5* 10.9* -- -- --  LATICACIDVEN -- -- -- -- 2.9* 3.8* --  Plan: -re-culture -cont current abx  Change in MS/ acute encephalopathy:  metabolic enceph vs  fat emboli Plan -supportive care   DM, hyperglycemia  CBG (last 3)   Basename 12/22/11 0316 12/21/11 2357 12/21/11 1940  GLUCAP 346* 280* 193*  plan: -cont insulin gtt  sick euthyroid Plan: Synthroid home med, held ( in setting tachy,. MI) reduce in future  Tibial fx -hep tolerated thus far -etiology of breaks? Still a mystery, Updated ortho, they are ok with heparin if life saving of course  Malnutrition -cont tubefeeds.   Best practices / Disposition: -->ICU status under PCCM -->full code -->hep drip -->Protonix for GI Px -->ventilator bundle -->diet TF 4/21   BABCOCK,PETE 12/22/2011, 8:59 AM   Reviewed above, examined pt and agree with assessment/plan.    He had worsening anemia requiring PRBC transfusion.  He has worsening renal function with  hyperkalemia.  He has increased abdominal distention and bladder pressure of 25.  ?if he has retroperitoneal bleed.  Could have renal failure from contrast load with previous CT's.  Have consulted renal.  Will arrange for CT abd/pelvis w/o contrast.  Continue full vent support.  Appreciate help from cardiology.  Continue current Abx.  Critical care time 40 minutes  Coralyn Helling, MD 12/22/2011, 9:59 AM Pager:  [phone]

## 2011-12-22 NOTE — Significant Event (Deleted)
CT abd negative for RPH, Flat plate abd c/w ileus. Spoke w/ Nephrology. Suspect that AKI was due to combined CT contrast and  shock. Hopeful that he will make spontaneous recovery, however given possibility for HD, and further Cardiac work-up we will need to move him to [facility].   Current problem list include the following:   Hypoxic Acute resp failure in setting of R>L bilateral infiltrates: most likely due to RLL PNA, possible aspiration and what may be evolving ALI vs edema. Fat emboli also still remains a possibility, CT chest was neg for VTE. CXR on 4/22 showing R>L bilateral airspace disease.  Plan:  -ARDS protocol  -sedation protocol  -scheduled BDs  -f/u CXR and abg  -cont HCAP coverage, cont broad spec abx  -no wean today   NSTEMI ? The primary event given "syncopal episode" or possibly related to acute illness and representing a "stress test" Has significant systolic heart failure.   Lab  12/20/11 0610  12/19/11 2040  12/19/11 1335   CKTOTAL  972*  1658*  1478*   CKMB  48.2*  261.2*  207.5*   TROPONINI  >25.00*  >25.00*  >25.00*   Plan:  -cont b blocker rx as BP allows -holding asa, heparin and plavix due to acute renal failure   Acute renal failure: Probably due to CT contrast and Hypotension  Recent Labs  Basename 12/22/11 0415 12/21/11 0410 12/20/11 2250   CREATININE 4.20* 1.21 1.06  plan: -maintain hemodynamics -follow up creatinine -move to [facility] in case he needs HD. Hyperkalemia may factor that pushes this.   Acute anemia. No clear evidence of bleeding, CT negative for [facility]   Lab 12/22/11 0415 12/21/11 0410 12/20/11 2250  HGB 6.8* 7.4* 8.1*  Plan:  -heme occult stools  -hold heparin, asa and plavix   Hyperkalemia   Lab 12/22/11 1132 12/22/11 0415 12/21/11 0410  K 5.1 5.3* 4.3  plan:  -recheck, may need HD if this is real   Fever/leukocytosis/ sepsis: presume this is d/t PNA.   Lab 12/22/11 0415 12/21/11 0410 12/20/11 2250 12/20/11 1948 12/20/11 0945  12/19/11 1335 12/19/11 1330  PROCALCITON 17.52 -- -- -- 7.20 -- 8.56  WBC 12.5* 12.0* 12.5* 10.9* -- -- --  LATICACIDVEN -- -- -- -- 2.9* 3.8* --  Plan:  -re-culture  -cont current abx   Change in MS/ acute encephalopathy: metabolic enceph vs fat emboli  Plan  -supportive care   DM, hyperglycemia  CBG (last 3)  CBG (last 3)   Basename 12/22/11 0316 12/21/11 2357 12/21/11 1940  GLUCAP 346* 280* 193*  plan:  -cont insulin gtt   sick euthyroid  Plan:  Synthroid home med, held ( in setting tachy,. MI) reduce in future   Tibial fx  -hep tolerated thus far  -etiology of breaks? Still a mystery,  Updated ortho, they are ok with heparin if life saving of course   Ileus 4/23 Plan: -rest gut -hold tube feeds -NGT to LIWS  Malnutrition  -cont tubefeeds.

## 2011-12-22 NOTE — Progress Notes (Signed)
eLink Physician-Brief Progress Note Patient Name: ESTEN DOLLAR DOB: 1966-07-16 MRN: [id number]  Date of Service  12/22/2011   HPI/Events of Note     eICU Interventions  CBGs persistently high after transition off  - resume insulin drip   Intervention Category Intermediate Interventions: Hyperglycemia - evaluation and treatment  Evamae Rowen V. 12/22/2011, 4:07 AM

## 2011-12-22 NOTE — Progress Notes (Signed)
eLink Physician-Brief Progress Note Patient Name: Reginald Gutierrez DOB: Apr 30, 1966 MRN: [id number]  Date of Service  12/22/2011   HPI/Events of Note   Pt now hypotensive on NMB  eICU Interventions  Start levophed drip   Intervention Category Major Interventions: Hypotension - evaluation and management  Shan Levans 12/22/2011, 7:16 PM

## 2011-12-22 NOTE — Progress Notes (Signed)
Patient ID: Reginald Gutierrez, male   DOB: 1966-05-23, 46 y.o.   MRN: [id number] Physician Progress Summary  Patient ID: Reginald Gutierrez MRN: [id number] DOB/AGE: 1966/08/02 46 y.o.  Admit date: 12/18/2011 Discharge date: 12/22/2011  Admission Diagnoses: Leg fracture   Diagnoses at time of transfer:  Active Problems:  HYPOTHYROIDISM  DIABETES MELLITUS, TYPE I  HYPERCHOLESTEROLEMIA  DEPRESSION  Tibial fracture  NSTEMI (non-ST elevated myocardial infarction)  Acute blood loss anemia  Acute respiratory failure  Aspiration pneumonia  Metabolic encephalopathy  Protein calorie malnutrition  Acute renal failure  Hyperkalemia  Ileus  Patient Details:    Reginald Gutierrez is an 46 y.o. male.  History of Present Illness: 46 yr old type 1 DM, prior cocaine abuse admitted with bilateral tibia Fx s/p syncopal episode. To OR 4/19 for repair of tibai Fx. In am 4/20 acute SOB, hypoxia, confusin, hypoxia and refractory Shock. To ICU, confused, some tan secretions. No CP. Had small rise trop noted, ECG with resolving ST depression. Found to be acidotic , hypoxic required emergent intubation. Pressors treated and A line require.Unclear etiology.   Lines / Drains:  4/20 ETT>>>  4/20 line rt ij>>>  4/20 Aline rt fem>>>   Cultures:  BC 4/20>>>  Sputum 4/20>>>NF   Abx:  Zosyn 4/20>>>  vanc 4/20>>>   Tests / Events:  4/19- repair bilat tibia Fx  4/20- refractory shock, hypoxic resp failure., intubated  4/20- bilateral infiltrates bases rt greaterleft  4/20 CT head - neg acute  4/20- hep started, CT chest spiral contrast avoided secondary to ARF, crt rise  4/20 Echo>>EF 25 to 30%, akinesis of mid/distal anteroseptal and apical areas, mild MR, mild reduction RV systolic fx 4/21 CT angio chest: No central pulmonary embolism. Slight progression of right greater than left airspace disease. 4/23 Renal US>>> prelim: neg 4/23 Fractional Urea>>>  4/23 Abd pressures>>>  4/23: CT abd: no RPH, no  obstruction  Subjective/interval  Marked rise in creatinine overnight. Now oliguric even after diuretic challenge. Hgb drifting down, No obvious bleeding, but abd is tight and distended.   Vital Signs:  BP 127/64  Pulse 119  Temp(Src) 101.7 F (38.7 C) (Core (Comment))  Resp 17  Ht 5\' 6"  (1.676 m)  Wt 96 kg (211 lb 10.3 oz)  BMI 34.16 kg/m2  SpO2 96%  Physical Examination:  General: sedated currently, no distress  Neuro: Nonfocal, equal power, perrl 3 mm  HEENT: obese  Neck: Obese, right IJ unremarkable  Cardiovascular: s1 s2 rrt reduced  Lungs: Rhonchi diffuse  Abdomen: Soft, BS wnl no r, reducible hernia  Musculoskeletal: bilat lower ext casts  Skin: No rash  Assessment and Plan:  Hypoxic Acute resp failure in setting of R>L bilateral infiltrates: most likely due to RLL PNA, possible aspiration and what may be evolving ALI vs edema. Fat emboli also still remains a possibility, CT chest was neg for VTE. CXR on 4/22 showing R>L bilateral airspace disease.  Plan:  -ARDS protocol clearly ineffective here.  Patient is profoundly asynchronous, if ARDS protocol is to be continued will need to paralyze patient.  In the meantime, best approach is lighter sedation, increase PEEP to 10 and PS to 10 with SIMV pressure of 10 over PEEP with F/U ABG. -Aedation protocol but decrease to maintain MV of 10 l/min. -Scheduled BDs. -F/u CXR and abg. -Cont HCAP coverage, cont broad spec abx. -No wean while renal status is unstable.  NSTEMI ? The primary event given "syncopal episode" or possibly related to  acute illness and representing a "stress test" Has significant systolic heart failure.   Lab 12/20/11 0610 12/19/11 2040 12/19/11 1335  CKTOTAL 972* 1658* 1478*  CKMB 48.2* 261.2* 207.5*  TROPONINI >25.00* >25.00* >25.00*  Plan:  -Cont b blocker rx as BP allows -Holding asa, heparin and plavix due to acute renal failure   Acute renal failure: Probably due to CT contrast and Hypotension   Recent Labs  Basename 12/22/11 1132 12/22/11 0415 12/21/11 0410   CREATININE 4.91* 4.20* 1.21  plan: -Maintain hemodynamics -Follow up creatinine -Renal to decide on dialysis in AM when seen.   Acute anemia. No clear evidence of bleeding, CT negative for [facility]   Lab 12/22/11 0415 12/21/11 0410 12/20/11 2250  HGB 6.8* 7.4* 8.1*  Plan:  -Heme occult stools  -Hold heparin, asa and plavix   Hyperkalemia   Lab 12/22/11 1132 12/22/11 0415 12/21/11 0410  K 5.1 5.3* 4.3  plan:  -Recheck, may need HD if this is real   Fever/leukocytosis/ sepsis: presume this is d/t PNA.   Lab 12/22/11 0415 12/21/11 0410 12/20/11 2250 12/20/11 1948 12/20/11 0945 12/19/11 1335 12/19/11 1330  PROCALCITON 17.52 -- -- -- 7.20 -- 8.56  WBC 12.5* 12.0* 12.5* 10.9* -- -- --  LATICACIDVEN -- -- -- -- 2.9* 3.8* --  Plan:  -re-culture  -cont current abx   Change in MS/ acute encephalopathy: metabolic enceph vs fat emboli  Plan  -supportive care   DM, hyperglycemia  CBG (last 3)   Basename 12/22/11 1557 12/22/11 1453 12/22/11 1331  GLUCAP 138* 159* 180*  plan:  -cont insulin gtt   sick euthyroid  Plan:  Synthroid home med, held ( in setting tachy,. MI) reduce in future   Tibial fx  -hep tolerated thus far  -etiology of breaks? Still a mystery,  Updated ortho, they are ok with heparin if life saving of course   Ileus 4/23 Plan: -rest gut -hold tube feeds -NGT to LIWS  Malnutrition  -cont tubefeeds.     Lab Results  Component Value Date   CREATININE 4.91* 12/22/2011   BUN 42* 12/22/2011   NA 138 12/22/2011   K 5.1 12/22/2011   CL 107 12/22/2011   CO2 24 12/22/2011   Lab Results  Component Value Date   WBC 12.5* 12/22/2011   HGB 6.8* 12/22/2011   HCT 21.8* 12/22/2011   MCV 93.2 12/22/2011   PLT 249 12/22/2011   Lab Results  Component Value Date   ALT 59* 12/22/2011   AST 73* 12/22/2011   ALKPHOS 101 12/22/2011   BILITOT 0.4 12/22/2011   Lab Results  Component Value Date   INR 1.47  12/20/2011   INR 1.40 12/19/2011   INR 1.13 12/19/2011   Scheduled Meds:    . chlorhexidine  15 mL Mouth Rinse BID  . furosemide  160 mg Intravenous Once  . metoprolol  2.5 mg Intravenous Q6H  . pantoprazole (PROTONIX) IV  40 mg Intravenous Q24H  . piperacillin-tazobactam (ZOSYN)  IV  2.25 g Intravenous Q8H  . sodium chloride  3 mL Intravenous Q12H  . sodium chloride      . DISCONTD: antiseptic oral rinse  15 mL Mouth Rinse QID  . DISCONTD: atorvastatin  40 mg Oral q1800  . DISCONTD: clopidogrel  75 mg Oral Q breakfast  . DISCONTD: feeding supplement (JEVITY 1.2 CAL)  1,000 mL Per Tube Q24H  . DISCONTD: insulin aspart  0-10 Units Subcutaneous Q4H  . DISCONTD: insulin aspart  5 Units Intravenous Once  .  DISCONTD: insulin glargine  38 Units Subcutaneous Q24H  . DISCONTD: pantoprazole sodium  40 mg Per Tube Q1200  . DISCONTD: piperacillin-tazobactam (ZOSYN)  IV  3.375 g Intravenous Q8H  . DISCONTD: vancomycin  1,250 mg Intravenous Q12H   Continuous Infusions:    . fentaNYL infusion INTRAVENOUS 100 mcg/hr (12/22/11 1652)  . insulin (NOVOLIN-R) infusion 0.8 Units/hr (12/22/11 1652)  . midazolam (VERSED) infusion 2 mg/hr (12/22/11 1652)  . DISCONTD: sodium chloride 50 mL/hr at 12/22/11 0800  . DISCONTD: dextrose    . DISCONTD: heparin 1,600 Units/hr (12/22/11 0100)  . DISCONTD: insulin (NOVOLIN-R) infusion 4.9 Units/hr (12/21/11 1150)   PRN Meds:.fentaNYL, midazolam, ondansetron (ZOFRAN) IV, DISCONTD: acetaminophen (TYLENOL) oral liquid 160 mg/5 mL, DISCONTD: ondansetron  Patient will be seen again in AM to decide on need for HD catheter.  F/U ABG ordered and is to be called to the eLink.  CC time 40 minutes.  Patient seen and examined, agree with above note.  I dictated the care and orders written for this patient under my direction.  Koren Bound, M.D. [phone]

## 2011-12-22 NOTE — Progress Notes (Signed)
eLink Physician-Brief Progress Note Patient Name: Reginald Gutierrez DOB: 01-08-1966 MRN: [id number]  Date of Service  12/22/2011   HPI/Events of Note   Pt worse with gas exchange.  Pt failed ARDS protocol and PCV  eICU Interventions  Start HFOV   Intervention Category Major Interventions: Respiratory failure - evaluation and management  Shan Levans 12/22/2011, 6:00 PM

## 2011-12-22 NOTE — Progress Notes (Signed)
Subjective: Patient sedated on vent. Sister present. Events noted.  Objective: Vital signs in last 24 hours: Temp:  [98 F (36.7 C)-102.7 F (39.3 C)] 101.7 F (38.7 C) (04/23 1645) Pulse Rate:  [94-128] 119  (04/23 1645) Resp:  [14-32] 17  (04/23 1645) BP: (85-127)/(46-64) 127/64 mmHg (04/23 1142) SpO2:  [87 %-100 %] 96 % (04/23 1645) FiO2 (%):  [30 %-100 %] 100 % (04/23 1623) Weight:  [96 kg (211 lb 10.3 oz)] 96 kg (211 lb 10.3 oz) (04/23 0500)  Intake/Output from previous day: 04/22 0701 - 04/23 0700 In: 3908.7 [I.V.:1903.7; NG/GT:1630; IV Piggyback:375] Out: 90 [Urine:90] Intake/Output this shift: Total I/O In: 885 [I.V.:342.5; Blood:12.5; NG/GT:480; IV Piggyback:50] Out: 240 [Urine:15; Emesis/NG output:225]   Basename 12/22/11 0415 12/21/11 0410 12/20/11 2250 12/20/11 1948 12/20/11 1543  HGB 6.8* 7.4* 8.1* 8.7* 8.6*    Basename 12/22/11 0415 12/21/11 0410  WBC 12.5* 12.0*  RBC 2.34* 2.51*  HCT 21.8* 22.6*  PLT 249 253    Basename 12/22/11 1132 12/22/11 0415  NA 138 133*  K 5.1 5.3*  CL 107 104  CO2 24 22  BUN 42* 40*  CREATININE 4.91* 4.20*  GLUCOSE 99 355*  CALCIUM 7.8* 7.7*    Basename 12/20/11 0430 12/19/11 2040  LABPT -- --  INR 1.47 1.40   Dressings changed  Wounds excellent  Compartments soft  circ intact distally Intact pulses distally  Assessment/Plan: Ortho stable  No active bleeding ,compts soft., no evidence of infection  Discussed with his sister.  Barbarita Hutmacher ANDREW 12/22/2011, 5:29 PM

## 2011-12-22 NOTE — Progress Notes (Signed)
0500-Increased RR to 30 BPM per ARDS protocol, PH on ABG this am was 7.24, RT to monitor and assess as  needed

## 2011-12-22 NOTE — Progress Notes (Signed)
CSW met with Pt's sister, Fritzi Mandes at the bedside to offer support and explain role of CSW in ICU. Sister is RN on 5E and this CSW has worked with her in the past doing discharges. Sister appreciative of support. Very concerned about Pt and stated that he had "been close to death several other times". Kirsten stated she has been the main Special educational needs teacher and reporter for the rest of the family. Her mother was RN too, but appears to have a more limited understanding currently. Sister also very perplexed as to how he broke both legs. She states he has had many social issues in the past that has led to many health issues. Pt was living alone currently, per sister. Pt has good support from family. Kirsten to call CSW as needed and CSW will follow closely.  Vennie Homans, [place] 12/22/2011 11:07 AM [phone]

## 2011-12-22 NOTE — Progress Notes (Addendum)
ANTIBIOTIC CONSULT NOTE - follow up  Pharmacy Consult for Vancomycin/Zosyn Indication: aspiration vs HAP PNA  No Known Allergies  Patient Measurements: Height: 5\' 6"  (167.6 cm) Weight: 211 lb 10.3 oz (96 kg) IBW/kg (Calculated) : 63.8   Vital Signs: Temp: 100.8 F (38.2 C) (04/23 0400) Temp src: Oral (04/23 0400) BP: 85/48 mmHg (04/23 0421) Pulse Rate: 105  (04/23 0700) Intake/Output from previous day: 04/22 0701 - 04/23 0700 In: 3836.5 [I.V.:1831.5; NG/GT:1630; IV Piggyback:375] Out: 90 [Urine:90] Intake/Output from this shift:    Labs:  Basename 12/22/11 0415 12/21/11 0410 12/20/11 2250  WBC 12.5* 12.0* 12.5*  HGB 6.8* 7.4* 8.1*  PLT 249 253 270  LABCREA -- -- --  CREATININE 4.20* 1.21 1.06   Estimated Creatinine Clearance: 24.1 ml/min (by C-G formula based on Cr of 4.2).  Basename 12/21/11 1400  VANCOTROUGH 21.8*  VANCOPEAK --  Drue Dun --  GENTTROUGH --  GENTPEAK --  GENTRANDOM --  TOBRATROUGH --  TOBRAPEAK --  TOBRARND --  AMIKACINPEAK --  AMIKACINTROU --  AMIKACIN --     Microbiology: Recent Results (from the past 720 hour(s))  MRSA PCR SCREENING     Status: Normal   Collection Time   12/19/11  2:44 PM      Component Value Range Status Comment   MRSA by PCR NEGATIVE  NEGATIVE  Final   CULTURE, BLOOD (ROUTINE X 2)     Status: Normal (Preliminary result)   Collection Time   12/19/11  3:43 PM      Component Value Range Status Comment   Specimen Description BLOOD LEFT ARM  5 ML IN [facility] BOTTLE   Final    Special Requests NONE   Final    Culture  Setup Time [id number]   Final    Culture     Final    Value:        BLOOD CULTURE RECEIVED NO GROWTH TO DATE CULTURE WILL BE HELD FOR 5 DAYS BEFORE ISSUING A FINAL NEGATIVE REPORT   Report Status PENDING   Incomplete   CULTURE, BLOOD (ROUTINE X 2)     Status: Normal (Preliminary result)   Collection Time   12/19/11  3:52 PM      Component Value Range Status Comment   Specimen Description BLOOD LEFT  HAND  3 ML IN [facility] BOTTLE   Final    Special Requests NONE   Final    Culture  Setup Time [id number]   Final    Culture     Final    Value:        BLOOD CULTURE RECEIVED NO GROWTH TO DATE CULTURE WILL BE HELD FOR 5 DAYS BEFORE ISSUING A FINAL NEGATIVE REPORT   Report Status PENDING   Incomplete   CULTURE, RESPIRATORY     Status: Normal (Preliminary result)   Collection Time   12/19/11  5:31 PM      Component Value Range Status Comment   Specimen Description TRACHEAL ASPIRATE   Final    Special Requests NONE   Final    Gram Stain     Final    Value: RARE WBC PRESENT,BOTH PMN AND MONONUCLEAR     RARE SQUAMOUS EPITHELIAL CELLS PRESENT     NO ORGANISMS SEEN   Culture Non-Pathogenic Oropharyngeal-type Flora Isolated.   Final    Report Status PENDING   Incomplete     Medical History: Past Medical History  Diagnosis Date  . Diabetes mellitus type 1   . Hyperlipidemia   .  Hypothyroidism   . Depression   . Umbilical hernia   . Hypertension     Assessment:  Day 4 Vancomycin/Zosyn for aspiration vs HAP  SCr more than doubled  Patient still febrile  Goal of Therapy:  Vancomycin trough level 15-20 mcg/ml  Plan:  1. Due to such large jump in SCr, will hold vancomycin now and check another vancomycin level at NOON today 2. Recheck vanc levels as needed  Hessie Knows, PharmD, BCPS Pager [phone] 12/22/2011 7:21 AM    Addendum:   Labs: Vanc Trough 47.8  A/P: Vancomycin trough supratherapeutic as expected due to acute renal failure. Continue to hold vancomycin. Will check a random vancomycin level tomorrow AM to see if patient clearing vanc at all  Hessie Knows, PharmD, BCPS Pager [phone] 12/22/2011 1:44 PM

## 2011-12-22 NOTE — Progress Notes (Signed)
Patient ID: Reginald Gutierrez, male   DOB: 05-20-1966, 46 y.o.   MRN: [id number]   Subjective:  The patient is sedated on vent. Rhythm is sinus tachycardia.     Objective:  Vital Signs in the last 24 hours: Temp:  [99.9 F (37.7 C)-102.6 F (39.2 C)] 100.8 F (38.2 C) (04/23 0400) Pulse Rate:  [94-133] 105  (04/23 0700) Resp:  [14-30] 30  (04/23 0700) BP: (75-102)/(46-52) 85/48 mmHg (04/23 0421) SpO2:  [92 %-100 %] 98 % (04/23 0700) FiO2 (%):  [30 %-40 %] 40 % (04/23 0500) Weight:  [96 kg (211 lb 10.3 oz)] 96 kg (211 lb 10.3 oz) (04/23 0500)  Intake/Output from previous day: 04/22 0701 - 04/23 0700 In: 3858.7 [I.V.:1853.7; NG/GT:1630; IV Piggyback:375] Out: 90 [Urine:90] Intake/Output from this shift:       . antiseptic oral rinse  15 mL Mouth Rinse QID  . atorvastatin  40 mg Oral q1800  . chlorhexidine  15 mL Mouth Rinse BID  . clopidogrel  75 mg Oral Q breakfast  . feeding supplement (JEVITY 1.2 CAL)  1,000 mL Per Tube Q24H  . furosemide  160 mg Intravenous Once  . furosemide  60 mg Intravenous Once  . metoprolol  2.5 mg Intravenous Q6H  . pantoprazole sodium  40 mg Per Tube Q1200  . piperacillin-tazobactam (ZOSYN)  IV  3.375 g Intravenous Q8H  . sodium chloride  500 mL Intravenous Once  . sodium chloride  3 mL Intravenous Q12H  . DISCONTD: insulin aspart  0-10 Units Subcutaneous Q4H  . DISCONTD: insulin aspart  5 Units Intravenous Once  . DISCONTD: insulin glargine  38 Units Subcutaneous Q24H  . DISCONTD: vancomycin  1,250 mg Intravenous Q12H  . DISCONTD: vancomycin  1,000 mg Intravenous Q8H  . DISCONTD: vancomycin  1,000 mg Intravenous Q8H      . sodium chloride 50 mL/hr at 12/21/11 2030  . fentaNYL infusion INTRAVENOUS 175 mcg/hr (12/22/11 0600)  . insulin (NOVOLIN-R) infusion 8.8 Units/hr (12/22/11 0630)  . midazolam (VERSED) infusion 5 mg/hr (12/22/11 0600)  . DISCONTD: dextrose    . DISCONTD: heparin 1,600 Units/hr (12/22/11 0100)  . DISCONTD: insulin  (NOVOLIN-R) infusion 4.9 Units/hr (12/21/11 1150)  . DISCONTD: norepinephrine (LEVOPHED) Adult infusion 5 mcg/min (12/19/11 1442)    Physical Exam: The patient appears to be in no distress, on ventilator, sedated.   Chest is clear anteriorly.  No rales or rhonchi.  Expansion of the chest is symmetrical.  Heart reveals no abnormal lift or heave.  First and second heart sounds are normal.  There is no murmur gallop rub or click.  The abdomen is soft and no apparent tenderness. Bowel sounds are normoactive.  There is no hepatosplenomegaly or mass.  There are no abdominal bruits.  Extremities have ace wraps bilat.  Toes are warm and well-perfused.  Neurologic exam: Sedated on vent.  Integument reveals no rash  Right IJ access  Lab Results:  Basename 12/22/11 0415 12/21/11 0410  WBC 12.5* 12.0*  HGB 6.8* 7.4*  PLT 249 253    Basename 12/22/11 0415 12/21/11 0410  NA 133* 137  K 5.3* 4.3  CL 104 107  CO2 22 24  GLUCOSE 355* 208*  BUN 40* 20  CREATININE 4.20* 1.21    Basename 12/20/11 0610 12/19/11 2040  TROPONINI >25.00* >25.00*   Hepatic Function Panel  Basename 12/22/11 0415  PROT 5.2*  ALBUMIN 2.0*  AST 73*  ALT 59*  ALKPHOS 101  BILITOT 0.4  BILIDIR --  IBILI --    Basename 12/20/11 0610  CHOL 77   No results found for this basename: PROTIME in the last 72 hours  Imaging:  Cardiac Studies: Study Conclusions 2D echo  - Left ventricle: The cavity size was normal. Wall thickness was normal. Systolic function was severely reduced. The estimated ejection fraction was in the range of 25% to 30%. There is akinesis of the mid-distalanteroseptal and apical myocardium. Left ventricular diastolic function parameters were normal. - Mitral valve: Mild regurgitation. - Right ventricle: Systolic function was mildly reduced. Transthoracic echocardiography. M-mode, complete 2D,   Assessment/Plan:  Patient Active Hospital Problem List:     Tibial fracture  (12/18/2011)   Assessment: Stable after surgery.  Per ortho no evidence of compartment syndrome.   Plan: Continue IV heparin NSTEMI (non-ST elevated myocardial infarction) (12/19/2011)   Assessment: EKG improved. BP improved.  Still tachycardic.  NSVT    Plan:continue low dose i.v. lopresser  ARF:  Marked jump in Cr.  Poor UO  Consider [place] to R/O nephrosis and renal consult   LOS: 4 days    Charlton Haws 12/22/2011, 7:43 AM

## 2011-12-23 ENCOUNTER — Inpatient Hospital Stay (HOSPITAL_COMMUNITY): Payer: Medicare Other

## 2011-12-23 ENCOUNTER — Encounter (HOSPITAL_COMMUNITY): Payer: Self-pay | Admitting: Anesthesiology

## 2011-12-23 DIAGNOSIS — R579 Shock, unspecified: Secondary | ICD-10-CM

## 2011-12-23 DIAGNOSIS — J80 Acute respiratory distress syndrome: Secondary | ICD-10-CM | POA: Diagnosis present

## 2011-12-23 DIAGNOSIS — G9340 Encephalopathy, unspecified: Secondary | ICD-10-CM

## 2011-12-23 LAB — POCT I-STAT 3, ART BLOOD GAS (G3+)
Acid-Base Excess: 12 mmol/L — ABNORMAL HIGH (ref 0.0–2.0)
Acid-Base Excess: 14 mmol/L — ABNORMAL HIGH (ref 0.0–2.0)
Acid-Base Excess: 8 mmol/L — ABNORMAL HIGH (ref 0.0–2.0)
Acid-Base Excess: 5 mmol/L — ABNORMAL HIGH (ref 0.0–2.0)
Acid-Base Excess: 5 mmol/L — ABNORMAL HIGH (ref 0.0–2.0)
Acid-base deficit: 4 mmol/L — ABNORMAL HIGH (ref 0.0–2.0)
Bicarbonate: 39.3 mEq/L — ABNORMAL HIGH (ref 20.0–24.0)
Bicarbonate: 33.5 mEq/L — ABNORMAL HIGH (ref 20.0–24.0)
Bicarbonate: 32.8 mEq/L — ABNORMAL HIGH (ref 20.0–24.0)
O2 Saturation: 100 %
O2 Saturation: 97 %
O2 Saturation: 84 %
O2 Saturation: 93 %
O2 Saturation: 98 %
Patient temperature: 99.3
Patient temperature: 97
Patient temperature: 97
Patient temperature: 97
TCO2: 26 mmol/L (ref 0–100)
TCO2: 38 mmol/L (ref 0–100)
TCO2: 41 mmol/L (ref 0–100)
TCO2: 37 mmol/L (ref 0–100)
TCO2: 37 mmol/L (ref 0–100)
pCO2 arterial: 46.5 mmHg — ABNORMAL HIGH (ref 35.0–45.0)
pCO2 arterial: 53.8 mmHg — ABNORMAL HIGH (ref 35.0–45.0)
pH, Arterial: 7.234 — ABNORMAL LOW (ref 7.350–7.450)
pH, Arterial: 7.423 (ref 7.350–7.450)
pH, Arterial: 7.32 — ABNORMAL LOW (ref 7.350–7.450)
pH, Arterial: 7.242 — ABNORMAL LOW (ref 7.350–7.450)
pO2, Arterial: 194 mmHg — ABNORMAL HIGH (ref 80.0–100.0)
pO2, Arterial: 79 mmHg — ABNORMAL LOW (ref 80.0–100.0)
pO2, Arterial: 34 mmHg — CL (ref 80.0–100.0)
pO2, Arterial: 82 mmHg (ref 80.0–100.0)
pO2, Arterial: 106 mmHg — ABNORMAL HIGH (ref 80.0–100.0)

## 2011-12-23 LAB — CBC
HCT: 24.8 % — ABNORMAL LOW (ref 39.0–52.0)
MCH: 29.1 pg (ref 26.0–34.0)
MCHC: 32.3 g/dL (ref 30.0–36.0)
MCV: 90.2 fL (ref 78.0–100.0)
Platelets: 300 10*3/uL (ref 150–400)
RDW: 15.5 % (ref 11.5–15.5)

## 2011-12-23 LAB — MAGNESIUM: Magnesium: 2.2 mg/dL (ref 1.5–2.5)

## 2011-12-23 LAB — BASIC METABOLIC PANEL
CO2: 34 mEq/L — ABNORMAL HIGH (ref 19–32)
Calcium: 6.9 mg/dL — ABNORMAL LOW (ref 8.4–10.5)
Creatinine, Ser: 4.47 mg/dL — ABNORMAL HIGH (ref 0.50–1.35)
GFR calc non Af Amer: 15 mL/min — ABNORMAL LOW (ref >90)
Sodium: 141 mEq/L (ref 135–145)

## 2011-12-23 LAB — POCT ACTIVATED CLOTTING TIME
Activated Clotting Time: 149 seconds
Activated Clotting Time: 160 seconds
Activated Clotting Time: 160 seconds
Activated Clotting Time: 171 seconds
Activated Clotting Time: 182 seconds
Activated Clotting Time: 188 seconds
Activated Clotting Time: 193 seconds

## 2011-12-23 LAB — COMPREHENSIVE METABOLIC PANEL
Albumin: 1.8 g/dL — ABNORMAL LOW (ref 3.5–5.2)
BUN: 50 mg/dL — ABNORMAL HIGH (ref 6–23)
Calcium: 7.2 mg/dL — ABNORMAL LOW (ref 8.4–10.5)
Chloride: 101 mEq/L (ref 96–112)
Creatinine, Ser: 5.74 mg/dL — ABNORMAL HIGH (ref 0.50–1.35)
Total Bilirubin: 0.5 mg/dL (ref 0.3–1.2)
Total Protein: 4.5 g/dL — ABNORMAL LOW (ref 6.0–8.3)

## 2011-12-23 LAB — RENAL FUNCTION PANEL
Albumin: 1.9 g/dL — ABNORMAL LOW (ref 3.5–5.2)
Chloride: 96 mEq/L (ref 96–112)
Creatinine, Ser: 4.2 mg/dL — ABNORMAL HIGH (ref 0.50–1.35)
GFR calc non Af Amer: 16 mL/min — ABNORMAL LOW (ref >90)
Phosphorus: 5.5 mg/dL — ABNORMAL HIGH (ref 2.3–4.6)
Potassium: 4.1 mEq/L (ref 3.5–5.1)

## 2011-12-23 LAB — GLUCOSE, CAPILLARY
Glucose-Capillary: 130 mg/dL — ABNORMAL HIGH (ref 70–99)
Glucose-Capillary: 158 mg/dL — ABNORMAL HIGH (ref 70–99)
Glucose-Capillary: 181 mg/dL — ABNORMAL HIGH (ref 70–99)
Glucose-Capillary: 190 mg/dL — ABNORMAL HIGH (ref 70–99)
Glucose-Capillary: 103 mg/dL — ABNORMAL HIGH (ref 70–99)
Glucose-Capillary: 188 mg/dL — ABNORMAL HIGH (ref 70–99)
Glucose-Capillary: 211 mg/dL — ABNORMAL HIGH (ref 70–99)
Glucose-Capillary: 107 mg/dL — ABNORMAL HIGH (ref 70–99)
Glucose-Capillary: 129 mg/dL — ABNORMAL HIGH (ref 70–99)
Glucose-Capillary: 92 mg/dL (ref 70–99)

## 2011-12-23 LAB — PROCALCITONIN: Procalcitonin: 9.54 ng/mL

## 2011-12-23 LAB — POCT I-STAT, CHEM 8
Calcium, Ion: 0.87 mmol/L — ABNORMAL LOW (ref 1.12–1.32)
Chloride: 98 mEq/L (ref 96–112)
Creatinine, Ser: 4.6 mg/dL — ABNORMAL HIGH (ref 0.50–1.35)
Glucose, Bld: 98 mg/dL (ref 70–99)
Potassium: 3.1 mEq/L — ABNORMAL LOW (ref 3.5–5.1)

## 2011-12-23 LAB — URINE CULTURE: Culture: NO GROWTH

## 2011-12-23 LAB — VANCOMYCIN, RANDOM: Vancomycin Rm: 38.3 ug/mL

## 2011-12-23 LAB — PHOSPHORUS: Phosphorus: 6.7 mg/dL — ABNORMAL HIGH (ref 2.3–4.6)

## 2011-12-23 MED ORDER — PRISMASOL BGK 4/2.5 32-4-2.5 MEQ/L IV SOLN
INTRAVENOUS | Status: DC
  Administered 2011-12-23 – 2011-12-26 (×6): via INTRAVENOUS_CENTRAL
  Filled 2011-12-23 (×6): qty 5000

## 2011-12-23 MED ORDER — LEVOFLOXACIN IN D5W 250 MG/50ML IV SOLN
250.0000 mg | INTRAVENOUS | Status: DC
  Administered 2011-12-23 – 2011-12-25 (×3): 250 mg via INTRAVENOUS
  Filled 2011-12-23 (×3): qty 50

## 2011-12-23 MED ORDER — ASPIRIN EC 81 MG PO TBEC: 81.0000 mg | DELAYED_RELEASE_TABLET | Freq: Every day | ORAL | Status: DC

## 2011-12-23 MED ORDER — INSULIN ASPART 100 UNIT/ML ~~LOC~~ SOLN
0.0000 [IU] | SUBCUTANEOUS | Status: DC
  Administered 2011-12-23: 4 [IU] via SUBCUTANEOUS
  Administered 2011-12-24: 7 [IU] via SUBCUTANEOUS
  Administered 2011-12-24: 4 [IU] via SUBCUTANEOUS

## 2011-12-23 MED ORDER — VANCOMYCIN HCL IN DEXTROSE 1-5 GM/200ML-% IV SOLN
1000.0000 mg | INTRAVENOUS | Status: DC
  Administered 2011-12-24: 1000 mg via INTRAVENOUS
  Filled 2011-12-23 (×2): qty 200

## 2011-12-23 MED ORDER — PRISMASOL BGK 4/2.5 32-4-2.5 MEQ/L IV SOLN
INTRAVENOUS | Status: DC
  Administered 2011-12-23 – 2011-12-24 (×7): via INTRAVENOUS_CENTRAL
  Filled 2011-12-23 (×11): qty 5000

## 2011-12-23 MED ORDER — ARTIFICIAL TEARS OP OINT
TOPICAL_OINTMENT | Freq: Three times a day (TID) | OPHTHALMIC | Status: DC
  Administered 2011-12-23: 06:00:00 via OPHTHALMIC
  Filled 2011-12-23: qty 3.5

## 2011-12-23 MED ORDER — MILRINONE IN DEXTROSE 200-5 MCG/ML-% IV SOLN
0.1250 ug/kg/min | INTRAVENOUS | Status: DC
  Administered 2011-12-23 – 2011-12-28 (×4): 0.125 ug/kg/min via INTRAVENOUS
  Filled 2011-12-23 (×7): qty 100

## 2011-12-23 MED ORDER — SODIUM CHLORIDE 0.9 % IV SOLN
INTRAVENOUS | Status: DC
  Administered 2011-12-23: 20 mL/h via INTRAVENOUS
  Administered 2011-12-26: 20:00:00 via INTRAVENOUS

## 2011-12-23 MED ORDER — PIPERACILLIN-TAZOBACTAM IN DEX 2-0.25 GM/50ML IV SOLN
2.2500 g | Freq: Four times a day (QID) | INTRAVENOUS | Status: DC
  Administered 2011-12-23 – 2011-12-25 (×8): 2.25 g via INTRAVENOUS
  Filled 2011-12-23 (×10): qty 50

## 2011-12-23 MED ORDER — ASPIRIN 81 MG PO CHEW
81.0000 mg | CHEWABLE_TABLET | Freq: Every day | ORAL | Status: DC
  Administered 2011-12-23 – 2012-01-07 (×15): 81 mg
  Filled 2011-12-23 (×15): qty 1

## 2011-12-23 MED ORDER — PRISMASOL BGK 0/2.5 32-2.5 MEQ/L IV SOLN
INTRAVENOUS | Status: DC
  Filled 2011-12-23 (×4): qty 5000

## 2011-12-23 MED ORDER — PRISMASOL BGK 4/2.5 32-4-2.5 MEQ/L IV SOLN
INTRAVENOUS | Status: DC
  Administered 2011-12-23 – 2011-12-25 (×3): via INTRAVENOUS_CENTRAL
  Filled 2011-12-23 (×5): qty 5000

## 2011-12-23 MED ORDER — STERILE WATER FOR INJECTION IV SOLN
INTRAVENOUS | Status: DC
  Administered 2011-12-23: 10:00:00 via INTRAVENOUS_CENTRAL
  Filled 2011-12-23 (×7): qty 150

## 2011-12-23 MED ORDER — BIOTENE DRY MOUTH MT LIQD
15.0000 mL | Freq: Four times a day (QID) | OROMUCOSAL | Status: DC
  Administered 2011-12-23 – 2012-01-07 (×59): 15 mL via OROMUCOSAL

## 2011-12-23 MED ORDER — ARTIFICIAL TEARS OP OINT: TOPICAL_OINTMENT | OPHTHALMIC | Status: DC | PRN

## 2011-12-23 NOTE — Progress Notes (Signed)
Inpatient Diabetes Program Recommendations  AACE/ADA: New Consensus Statement on Inpatient Glycemic Control (2009)  Target Ranges:  Prepandial:   less than 140 mg/dL      Peak postprandial:   less than 180 mg/dL (1-2 hours)      Critically ill patients:  140 - 180 mg/dL   Reason for Visit: Note patient currently on CRRT.  He has history of Type 1 diabetes.  Agree with use of insulin drip at this time.  Would not attempt transition off IV insulin at this time.

## 2011-12-23 NOTE — Progress Notes (Signed)
SUBJECTIVE: Intubated and sedated.   BP 84/63  Pulse 89  Temp(Src) 97 F (36.1 C) (Core (Comment))  Resp 0  Ht 5\' 6"  (1.676 m)  Wt 211 lb 10.3 oz (96 kg)  BMI 34.16 kg/m2  SpO2 100%  Intake/Output Summary (Last 24 hours) at 12/23/11 0939 Last data filed at 12/23/11 0902  Gross per 24 hour  Intake 2841.6 ml  Output   1645 ml  Net 1196.6 ml    PHYSICAL EXAM General: Intubated and sedated.  Neck: No masses Lungs: Coarse breath sounds bilaterally. Difficult exam with oscillating ventilatorwith no wheezes or rhonci noted.  Heart: Tachy, exam difficult with oscillating ventilator  Abdomen: Soft   Extremities: warm to touch  LABS: Basic Metabolic Panel:  Basename 12/23/11 0418 12/23/11 0417 12/22/11 1753  NA 138 -- 137  K 6.0* -- 5.5*  CL 101 -- 105  CO2 22 -- 22  GLUCOSE 234* -- 102*  BUN 50* -- 47*  CREATININE 5.74* -- 5.52*  CALCIUM 7.2* -- 8.0*  MG -- 2.2 --  PHOS -- 6.7* --   CBC:  Basename 12/23/11 0418 12/22/11 0415 12/21/11 0410  WBC 18.0* 12.5* --  NEUTROABS -- -- 9.0*  HGB 8.0* 6.8* --  HCT 24.8* 21.8* --  MCV 90.2 93.2 --  PLT 300 249 --    Current Meds:    . antiseptic oral rinse  15 mL Mouth Rinse QID  . chlorhexidine  15 mL Mouth Rinse BID  . cisatracurium  10 mg Intravenous Once  . heparin  4,000 Units Intravenous Once  . heparin  4,000 Units Intravenous Once  . heparin  5,000 Units Subcutaneous Q8H  . levofloxacin (LEVAQUIN) IV  250 mg Intravenous Q24H  . pantoprazole (PROTONIX) IV  40 mg Intravenous Q24H  . piperacillin-tazobactam (ZOSYN)  IV  2.25 g Intravenous Q6H  . sodium chloride  3 mL Intravenous Q12H  . sodium chloride      . vancomycin  1,000 mg Intravenous Q24H  . DISCONTD: antiseptic oral rinse  15 mL Mouth Rinse QID  . DISCONTD: artificial tears   Both Eyes Q8H  . DISCONTD: atorvastatin  40 mg Oral q1800  . DISCONTD: feeding supplement (JEVITY 1.2 CAL)  1,000 mL Per Tube Q24H  . DISCONTD: metoprolol  2.5 mg  Intravenous Q6H  . DISCONTD: pantoprazole sodium  40 mg Per Tube Q1200  . DISCONTD: piperacillin-tazobactam (ZOSYN)  IV  2.25 g Intravenous Q8H  . DISCONTD: piperacillin-tazobactam (ZOSYN)  IV  3.375 g Intravenous Q8H     ASSESSMENT AND PLAN: 46 yo male with h/o tobacco abuse, cocaine abuse, type 1 DM, HTN, HLD admitted to [facility] 12/18/11 after syncopal event sustaining bilateral tibial fractures, now s/p surgical repair. On POD#1, pt had c/o dyspnea, was confused and hypoxic and was intubated. He has subsequently ruled in for MI with troponin over 25. Initial EKG on 12/19/11 with ST depression. He has also developed acute renal failure with minimal urine output and is now on CVVHD. Acute respiratory failure complicated by pneumonia, now on IV antibiotics and oscillating ventilation. Echo 12/19/11 with LVEF of 25-30% with akinesis of the mid-distal-anteroseptal and apical myocardium.   1. NSTEMI: Pt has multiple risk factors for CAD including tobacco abuse, cocaine abuse, long standing type 1 DM, HTN, HLD who is admitted after a syncopal event sustaining bilateral broken tibia. Echo with segmental LV dysfunction suggesting coronary occlusion. It is not clear if he had ACS leading to the syncope before  admission. More likely that his ACS happened during or after surgery given the curve of his troponin. He has been off of heparin gtt, Plavix and ASA because of anemia. He remains hypotensive likely due to septic and cardiogenic shock. IV beta blockers have been used as BP tolerated but currently on hold. He will need a cardiac cath when acute issues resolved. We cannot plan cardiac cath at this time given renal failure.     -Consider inotropic agent such as milrinone for cardiogenic shock.   - Check CVP and plan diuresis based on results.   - ASA  when appropriate from bleeding standpoint.   - beta blockers on hold secondary to hypotension   -  We will follow with you.     Reginald Gutierrez  4/24/20139:39 AM

## 2011-12-23 NOTE — Progress Notes (Signed)
Subjective: Started on CRRT early this morning due to worsening acidosis and hyperK+  Objective Vital signs in last 24 hours: Filed Vitals:   12/23/11 1022 12/23/11 1030 12/23/11 1045 12/23/11 1100  BP: 82/61 92/71 101/59   Pulse: 87 84 85 84  Temp:  96.3 F (35.7 C) 96.2 F (35.7 C) 96.1 F (35.6 C)  TempSrc:      Resp:      Height:      Weight:      SpO2: 100% 100% 100% 100%   Weight change:   Intake/Output Summary (Last 24 hours) at 12/23/11 1106 Last data filed at 12/23/11 1100  Gross per 24 hour  Intake 2829.2 ml  Output   2031 ml  Net  798.2 ml   Labs: Basic Metabolic Panel:  Lab 12/23/11 1610 12/23/11 0417 12/22/11 1753 12/22/11 1132 12/22/11 0415 12/21/11 0410 12/20/11 2250 12/20/11 1948 12/19/11 0346  NA 138 -- 137 138 133* 137 139 138 --  K 6.0* -- 5.5* 5.1 5.3* 4.3 4.0 3.8 --  CL 101 -- 105 107 104 107 109 107 --  CO2 22 -- 22 24 22 24 25 24  --  GLUCOSE 234* -- 102* 99 355* 208* 165* 225* --  BUN 50* -- 47* 42* 40* 20 17 18  --  CREATININE 5.74* -- 5.52* 4.91* 4.20* 1.21 1.06 1.14 --  ALB -- -- -- -- -- -- -- -- --  CALCIUM 7.2* -- 8.0* 7.8* 7.7* 7.5* 7.6* 7.7* --  PHOS -- 6.7* -- -- -- -- -- -- 4.1   Liver Function Tests:  Lab 12/23/11 0418 12/22/11 0415 12/21/11 0410  AST 52* 73* 104*  ALT 50 59* 59*  ALKPHOS 117 101 83  BILITOT 0.5 0.4 0.5  PROT 4.5* 5.2* 4.7*  ALBUMIN 1.8* 2.0* 2.0*   No results found for this basename: LIPASE:3,AMYLASE:3 in the last 168 hours No results found for this basename: AMMONIA:3 in the last 168 hours CBC:  Lab 12/23/11 0418 12/22/11 0415 12/21/11 0410 12/20/11 2250 12/20/11 0430 12/19/11 0346 12/18/11 1410  WBC 18.0* 12.5* 12.0* 12.5* -- -- --  NEUTROABS -- -- 9.0* -- 7.4 12.6* 16.1*  HGB 8.0* 6.8* 7.4* 8.1* -- -- --  HCT 24.8* 21.8* 22.6* 24.4* -- -- --  MCV 90.2 93.2 90.0 89.4 -- -- --  PLT 300 249 253 270 -- -- --   PT/INR: @labrcntip (inr:5) Cardiac Enzymes:  Lab 12/20/11 0610 12/19/11 2040 12/19/11 1335  12/19/11 0346  CKTOTAL 972* 1658* 1478* 488*  CKMB 48.2* 261.2* 207.5* 13.5*  CKMBINDEX -- -- -- --  TROPONINI >25.00* >25.00* >25.00* 0.79*   CBG:  Lab 12/23/11 0617 12/23/11 0515 12/23/11 0415 12/23/11 0313 12/23/11 0214  GLUCAP 227* 190* 231* 177* 181*    Iron Studies: No results found for this basename: IRON:30,TIBC:30,TRANSFERRIN:30,FERRITIN:30 in the last 168 hours Studies/Results: Ct Abdomen Pelvis Wo Contrast  12/22/2011  *RADIOLOGY REPORT*  Clinical Data: Patient found unresponsive with bilateral lower leg fractures.  Shortness of breath and hypoxia.  Abdominal distention. Question retroperitoneal hematoma.  CT ABDOMEN AND PELVIS WITHOUT CONTRAST  Technique:  Multidetector CT imaging of the abdomen and pelvis was performed following the standard protocol without intravenous contrast.  Comparison: CT chest 12/19/2011.  Findings: Right greater than left pleural effusions persist and appear slightly increased.  There has been interval worsening of bilateral lower lobe airspace disease, greater on the right.  Air bronchograms are identified.  No pericardial effusion.  NG tube is in place with the tip at  the ligament of Treitz.  The gallbladder, liver, spleen, adrenal glands, pancreas and kidneys appear normal.  There is no hematoma or other fluid collection within the abdomen.  Foley catheter is in place in a decompressed urinary bladder.  Body wall edema is noted.  There is no evidence of bowel obstruction. Fat containing ventral hernia is noted.  No lymphadenopathy.  No focal bony abnormality.  IMPRESSION:  1.  Negative for retroperitoneal hematoma or other acute abnormality. 2.  Increased bilateral pleural effusions and basilar airspace disease since chest CT 12/19/2011.  Original Report Authenticated By: Bernadene Bell. Maricela Curet, M.D.   US Renal Port  12/22/2011  *RADIOLOGY REPORT*  Clinical Data: Acute renal failure  RENAL/URINARY TRACT ULTRASOUND COMPLETE  Comparison:  None.  Findings:   Examination is minimally degraded secondary to patient body habitus and medical state.  Right Kidney:  Normal cortical thickness, echogenicity and size, measuring 12.0 cm in length.  No focal renal lesions.  No echogenic renal stones.  No urinary obstruction.  Left Kidney:  Normal cortical thickness, echogenicity and size, measuring 12.4 cm in length.  No focal renal lesions.  No echogenic renal stones.  No urinary obstruction.  Bladder:  Decompressed with a Foley catheter.  IMPRESSION: No explanation for patient's acute renal failure, specifically, no evidence of hydronephrosis.  Original Report Authenticated By: Waynard Reeds, M.D.   Dg Chest Port 1 View  12/23/2011  *RADIOLOGY REPORT*  Clinical Data: Hemodialysis catheter placement  PORTABLE CHEST - 1 VIEW  Comparison: 12/22/2011  Findings: Left IJ approach hemodialysis catheter terminates over the disc ligated.  Other support apparatus is unchanged.  Diffuse perihilar airspace opacities are minimally improved which could signify edema. No pneumothorax.  IMPRESSION: Left IJ approach hemodialysis catheter with tip over the distal IJ vein.  Original Report Authenticated By: Harrel Lemon, M.D.   Dg Chest Port 1 View  12/22/2011  *RADIOLOGY REPORT*  Clinical Data: Intubated  PORTABLE CHEST - 1 VIEW  Comparison: 12/22/2011  Findings: Endotracheal tube is appropriately positioned. Nasogastric tube terminates below the level of the diaphragms but the tip is not included on the film.  Right IJ central line tip terminates over the mid SVC.  Diffuse perihilar airspace opacities are noted, increased.  Trace pleural effusions again noted.  Heart size at upper limits of normal.  IMPRESSION: Support apparatus as above.  Increasing ill-defined perihilar airspace opacities and trace effusions which could reflect worsening edema or other alveolar filling process.  Original Report Authenticated By: Harrel Lemon, M.D.   Dg Chest Port 1 View  12/22/2011   *RADIOLOGY REPORT*  Clinical Data: Evaluate endotracheal tube  PORTABLE CHEST - 1 VIEW  Comparison: 12/21/2011; 12/20/2011; chest CT - 12/20/2011  Findings: Grossly unchanged cardiac silhouette and mediastinal contours gave an decreased lung volumes and patient rotation. Stable position of support apparatus.  The pulmonary vasculature remains indistinct.  Grossly unchanged bilateral mid lung heterogeneous air space opacities, right greater than left.  Likely unchanged small bilateral pleural effusions are suspected.  No definite pneumothorax.  Grossly unchanged bones.  IMPRESSION: 1.  Stable positioning of support apparatus.  No pneumothorax. 2.  Grossly unchanged findings of pulmonary edema and bilateral mid lung heterogeneous opacities, right greater than left, atelectasis versus infiltrate.  Original Report Authenticated By: Waynard Reeds, M.D.   Dg Abd Portable 1v  12/22/2011  *RADIOLOGY REPORT*  Clinical Data: Abdominal distension  PORTABLE ABDOMEN - 1 VIEW  Comparison: Chest radiograph - 12/22/2011  Findings:  Limited  visualization of the lower thorax demonstrates improved inspiration with persistent bilateral perihilar heterogeneous opacities, right greater than left.  Persistent mild elevation right hemidiaphragm.  No definite pleural effusion.  There is mild gaseous distension of a rather featureless loop of likely small bowel within the right mid hemiabdomen measuring approximately 4.3 cm in diameter. Minimal distal colonic gas identified.  Redundant enteric tubing is coiled within the stomach. No supine evidence of pneumoperitoneum pneumatosis.  Lumbar spine degenerative change.  Support apparatus overlies the right thigh.  IMPRESSION:  1.  Mild dilatation of a loop of likely small bowel in the right mid hemiabdomen with relative paucity of distal colonic gas may represent a partial small bowel obstruction.  Pain attention on follow-up is recommended.  2.  Support apparatus overlies the right upper  thigh and may be external to the patient.  If this is a central venous catheter, it is pointing peripherally.  Clinical correlation is advised.  This was made a call report.  Original Report Authenticated By: Waynard Reeds, M.D.   Medications:    . sodium chloride 20 mL/hr (12/23/11 1004)  . cisatracurium (NIMBEX) infusion 5 mcg/kg/min (12/23/11 1019)  . fentaNYL infusion INTRAVENOUS 275 mcg/hr (12/23/11 0802)  . heparin 10,000 units/ 20 mL infusion syringe 1,350 Units/hr (12/23/11 1014)  . insulin (NOVOLIN-R) infusion 7.7 mL/hr at 12/23/11 0802  . midazolam (VERSED) infusion 8 mg/hr (12/23/11 0445)  . norepinephrine (LEVOPHED) Adult infusion 8 mcg/min (12/23/11 0630)  . dialysis replacement fluid (prismasate)    . dialysis replacement fluid (prismasate)    . dialysate (PRISMASATE)    . DISCONTD: sodium chloride 50 mL/hr at 12/22/11 0800  . DISCONTD: dialysate (PRISMASATE) 1,000 mL/hr at 12/23/11 0859  . DISCONTD: dialysate (PRISMASATE) 1,000 mL/hr at 12/23/11 0648  . DISCONTD:  sodium bicarbonate infusion 1000 mL Stopped (12/23/11 0941)  . DISCONTD: sodium bicarbonate (isotonic) 1000 mL infusion 500 mL/hr at 12/23/11 0859  . DISCONTD: sodium bicarbonate (isotonic) 1000 mL infusion 500 mL/hr at 12/23/11 0911  . DISCONTD: sodium bicarbonate (isotonic) 1000 mL infusion 500 mL/hr at 12/23/11 1028      . antiseptic oral rinse  15 mL Mouth Rinse QID  . aspirin  81 mg Per Tube Daily  . chlorhexidine  15 mL Mouth Rinse BID  . cisatracurium  10 mg Intravenous Once  . heparin  4,000 Units Intravenous Once  . heparin  4,000 Units Intravenous Once  . heparin  5,000 Units Subcutaneous Q8H  . levofloxacin (LEVAQUIN) IV  250 mg Intravenous Q24H  . pantoprazole (PROTONIX) IV  40 mg Intravenous Q24H  . piperacillin-tazobactam (ZOSYN)  IV  2.25 g Intravenous Q6H  . sodium chloride  3 mL Intravenous Q12H  . sodium chloride      . vancomycin  1,000 mg Intravenous Q24H  . DISCONTD: antiseptic  oral rinse  15 mL Mouth Rinse QID  . DISCONTD: artificial tears   Both Eyes Q8H  . DISCONTD: aspirin EC  81 mg Oral Daily  . DISCONTD: atorvastatin  40 mg Oral q1800  . DISCONTD: feeding supplement (JEVITY 1.2 CAL)  1,000 mL Per Tube Q24H  . DISCONTD: metoprolol  2.5 mg Intravenous Q6H  . DISCONTD: pantoprazole sodium  40 mg Per Tube Q1200  . DISCONTD: piperacillin-tazobactam (ZOSYN)  IV  2.25 g Intravenous Q8H  . DISCONTD: piperacillin-tazobactam (ZOSYN)  IV  3.375 g Intravenous Q8H    I  have reviewed scheduled and prn medications.  Physical Exam:  Blood pressure 101/59, pulse 84, temperature  96.1 F (35.6 C), temperature source Core (Comment), resp. rate 0, height 5\' 6"  (1.676 m), weight 96 kg (211 lb 10.3 oz), SpO2 100.00%.  Gen: on vent sedated  Skin: no rash, cyanosis  Neck: no JVD, bruits or LAN  Chest: coarse BS, no fluid in the ETT  Heart: regular, no rub or gallop  Abdomen: soft, nondistended, + BS  Ext: diffuse nonpitting edema 2+ of all extremities  Neuro: alert, Ox3, no focal deficit  Heme/Lymph: no bruising or LAN   Assessment/Recommendations  1. AKI due to ATN from combination of IV dye and hypotension/shock- oliguric,  CRRT started early am today for worsening hyperkalemia and acidosis. Repeat ABG and K+ show resolution of acidosis and hyperK+, will adjust dialysate and replacement fluids accordingly. On heparin AC for CRRT. Keeping even, on pressors, CXR pending today.  2. Hyperkalemia- resolved 3. Metabolic acidosis- resolved with CRRT 4. Acute NSTEMI with ++ CE's and wall motion abnormalities on ECHO 5. TYPE I DM 6. Asp pneumonia 7. VDRF on full support 8. S/p fall with bilat LE tibial fracture 9. Hx HTN- bp meds on hold due to low bp's    Reginald Moselle  MD [facility] [phone] pgr    [phone] cell 12/23/2011, 11:06 AM

## 2011-12-23 NOTE — Progress Notes (Addendum)
ANTIBIOTIC CONSULT NOTE - INITIAL  Pharmacy Consult for Vancomycin  Indication: rule out pneumonia  No Known Allergies  Patient Measurements: Height: 5\' 6"  (167.6 cm) Weight: 211 lb 10.3 oz (96 kg) IBW/kg (Calculated) : 63.8  Vital Signs: Temp: 97.1 F (36.2 C) (04/24 0745) Temp src: Core (Comment) (04/24 0000) BP: 91/59 mmHg (04/24 0745) Pulse Rate: 90  (04/24 0730) Intake/Output from previous day: 04/23 0701 - 04/24 0700 In: 3004.9 [I.V.:2034.9; Blood:350; NG/GT:510; IV Piggyback:110] Out: 1179 [Urine:40; Emesis/NG output:285] Intake/Output from this shift: Total I/O In: 265.4 [I.V.:215.4; IV Piggyback:50] Out: 253 [Urine:20; Other:233]  Labs:  [facility] 12/23/11 0418 12/22/11 1753 12/22/11 1132 12/22/11 0930 12/22/11 0415 12/21/11 0410  WBC 18.0* -- -- -- 12.5* 12.0*  HGB 8.0* -- -- -- 6.8* 7.4*  PLT 300 -- -- -- 249 253  LABCREA -- -- -- 183.1 -- --  CREATININE 5.74* 5.52* 4.91* -- -- --   Estimated Creatinine Clearance: 17.6 ml/min (by C-G formula based on Cr of 5.74).  Basename 12/23/11 0418 12/22/11 1227 12/21/11 1400  VANCOTROUGH -- 47.8* 21.8*  VANCOPEAK -- -- --  Drue Dun 38.3 -- --  GENTTROUGH -- -- --  GENTPEAK -- -- --  GENTRANDOM -- -- --  TOBRATROUGH -- -- --  TOBRAPEAK -- -- --  TOBRARND -- -- --  AMIKACINPEAK -- -- --  AMIKACINTROU -- -- --  AMIKACIN -- -- --     Microbiology: Recent Results (from the past 720 hour(s))  MRSA PCR SCREENING     Status: Normal   Collection Time   12/19/11  2:44 PM      Component Value Range Status Comment   MRSA by PCR NEGATIVE  NEGATIVE  Final   CULTURE, BLOOD (ROUTINE X 2)     Status: Normal (Preliminary result)   Collection Time   12/19/11  3:43 PM      Component Value Range Status Comment   Specimen Description BLOOD LEFT ARM  5 ML IN [facility] BOTTLE   Final    Special Requests NONE   Final    Culture  Setup Time [id number]   Final    Culture     Final    Value:        BLOOD CULTURE RECEIVED NO GROWTH  TO DATE CULTURE WILL BE HELD FOR 5 DAYS BEFORE ISSUING A FINAL NEGATIVE REPORT   Report Status PENDING   Incomplete   CULTURE, BLOOD (ROUTINE X 2)     Status: Normal (Preliminary result)   Collection Time   12/19/11  3:52 PM      Component Value Range Status Comment   Specimen Description BLOOD LEFT HAND  3 ML IN [facility] BOTTLE   Final    Special Requests NONE   Final    Culture  Setup Time [id number]   Final    Culture     Final    Value:        BLOOD CULTURE RECEIVED NO GROWTH TO DATE CULTURE WILL BE HELD FOR 5 DAYS BEFORE ISSUING A FINAL NEGATIVE REPORT   Report Status PENDING   Incomplete   CULTURE, RESPIRATORY     Status: Normal   Collection Time   12/19/11  5:31 PM      Component Value Range Status Comment   Specimen Description TRACHEAL ASPIRATE   Final    Special Requests NONE   Final    Gram Stain     Final    Value: RARE WBC PRESENT,BOTH PMN AND MONONUCLEAR  RARE SQUAMOUS EPITHELIAL CELLS PRESENT     NO ORGANISMS SEEN   Culture Non-Pathogenic Oropharyngeal-type Flora Isolated.   Final    Report Status 12/22/2011 FINAL   Final     Medical History: Past Medical History  Diagnosis Date  . Diabetes mellitus type 1   . Hyperlipidemia   . Hypothyroidism   . Depression   . Umbilical hernia   . Hypertension     Medications:  Anti-infectives     Start     Dose/Rate Route Frequency Ordered Stop   12/22/11 1300  piperacillin-tazobactam (ZOSYN) IVPB 2.25 g       2.25 g 100 mL/hr over 30 Minutes Intravenous Every 8 hours 12/22/11 1233     12/21/11 2359   vancomycin (VANCOCIN) 1,250 mg in sodium chloride 0.9 % 250 mL IVPB  Status:  Discontinued        1,250 mg 166.7 mL/hr over 90 Minutes Intravenous Every 12 hours 12/21/11 1532 12/22/11 0722   12/21/11 1330   vancomycin (VANCOCIN) IVPB 1000 mg/200 mL premix  Status:  Discontinued        1,000 mg 200 mL/hr over 60 Minutes Intravenous Every 8 hours 12/21/11 1225 12/21/11 1532   12/19/11 1330   vancomycin (VANCOCIN) IVPB  1000 mg/200 mL premix  Status:  Discontinued        1,000 mg 200 mL/hr over 60 Minutes Intravenous Every 8 hours 12/19/11 1302 12/21/11 1107   12/19/11 1315   piperacillin-tazobactam (ZOSYN) IVPB 3.375 g  Status:  Discontinued        3.375 g 12.5 mL/hr over 240 Minutes Intravenous Every 8 hours 12/19/11 1302 12/22/11 1233         Assessment: 45 yom admitted for bilateral tibial fracture s/p syncope (was stable after surgery 4/19), but then transferred to ICU with s/sx's of septic shock and NSTEMI. Being intubated, pressors started as well as broad spectrum abx.  Infectious Disease: Sepsis-source?, No evidence of infection at fracture site, on day #4 Vanc/Zosyn for likely aspiration PNA vs HAP. ARF- on CVVHD.WBC 18 (up), Tm 102.4, PCT 17.5 >> 9.54.   Abx: 4/20 zosyn >> 4/20 vanc >>   Cultures: 4/23 blood: pending 4/23 urine: pending 4/20 trach aspirate: NPF 4/20 blood: ngtd 4/20 MRSA PCR negative  Drug Levels/Dose Changes:  4/22: VT @ 1400 = 21.8 - change vanc from 1g q8 to 1250mg  q12 4/23: VT @ 1200 = 47.8 - continue to hold vanc 4/24 Random Vanc @ 0500 = 38.3 - (Ke 0.0129; t1/2 54hrs), but now started CVVDHF at 1AM. (assume CrCl~30 and restart Vanc after 24hrs of treatment.   Goal of Therapy:  Vancomycin trough level 15-20 mcg/ml  Plan:  1. Vancomycin 1g IV q24h starting 4/25 AM. 2. Follow-up levels as needed.  3. Follow-up CRRT plan.   Link Snuffer, PharmD, BCPS Clinical Pharmacist [phone] 12/23/2011,8:24 AM  Addendum, 12/23/2011, 9:10 AM Adjusted Zosyn to 2.25g IV q6h while on CRRT. Follow-up CRRT plans.  Link Snuffer, PharmD, BCPS Clinical Pharmacist [phone] 12/23/2011,9:10 AM

## 2011-12-23 NOTE — Progress Notes (Signed)
Name: Reginald Gutierrez MRN: [id number] DOB: [birth year]    LOS: 5  PCCM PROGRESS NOTE Active Problems:  NSTEMI (non-ST elevated myocardial infarction)  Acute respiratory failure  Aspiration pneumonia  Acute renal failure  ARDS (adult respiratory distress syndrome)  Tibial fracture  Acute blood loss anemia  Metabolic encephalopathy  Protein calorie malnutrition  Hyperkalemia  Encephalopathy acute  HYPOTHYROIDISM  DIABETES MELLITUS, TYPE I  Ileus  History of Present Illness: 46 y/o with history of DM and cocaine abuse admitted with bilateral tibia fracture s/p syncopal episode on 4/19. On 4/20 he developed acute hypoxia and refractory shock, transient ST depression and troponin leak.  Course is complicated by ARDS requiring HFOV and acute renal failure requiring CVVHD.  Lines / Drains: 4/20 ETT>>> 4/20 line rt ij>>> 4/20 Aline rt fem>>> 4/23 L IJ CVL  Cultures: 4/20  Blood >>> 4/20  Sputum >>>  Abx: Zosyn 4/20 (empirical, G-, anaerobes) >>> Vanc 4/20 (empirical, MRSA) >>> Levaquin 4/24 (empirical, atypical) >>>  Tests / Events: 4/19  Admission, repair of bilat tibia Fx 4/20  Refractory shock, hypoxemic resp failure, NSTEMI.  Intubated 4/20  CT head >>> NAD 4/20  TTE >>> EF 25 to 30%, akinesis of mid/distal anteroseptal and apical area 4/21  Chest CT angio >>> No central pulmonary embolism. Slight progression of right greater than left airspace disease. 4/23  Renal US >>> no hydronephrosis, no urine obstruction 4/23  HFOV due to refractory hypoxemia.  Vasopressors.  CVVHD.  Subjective/interval Marked rise in creatinine since 4/23, still trending up (5.74 now), hyperkalemia (K 6.0), Now oliguric even after diuretic challenge. Hgb drifting down, No obvious bleeding, but abd is tight and distended.   The patient is sedated, intubated and unable to provide history, which was obtained for available medical records.    Past Medical History  Diagnosis Date  . Diabetes mellitus  type 1   . Hyperlipidemia   . Hypothyroidism   . Depression   . Umbilical hernia   . Hypertension    Past Surgical History  Procedure Date  . Appendectomy     open   Prior to Admission medications   Medication Sig Start Date End Date Taking? Authorizing Provider  atorvastatin (LIPITOR) 40 MG tablet Take 40 mg by mouth daily.    Yes Historical Provider, MD  buPROPion (WELLBUTRIN SR) 150 MG 12 hr tablet Take 150 mg by mouth daily.   Yes Historical Provider, MD  insulin glargine (LANTUS) 100 UNIT/ML injection Inject 25 Units into the skin at bedtime.    Yes Historical Provider, MD  insulin lispro (HUMALOG) 100 UNIT/ML injection Inject 5-10 Units into the skin 3 (three) times daily before meals.    Yes Historical Provider, MD  levothyroxine (SYNTHROID, LEVOTHROID) 200 MCG tablet Take 200 mcg by mouth daily.     Yes Historical Provider, MD  lisinopril (PRINIVIL,ZESTRIL) 40 MG tablet Take 40 mg by mouth daily. For blood pressure   Yes Historical Provider, MD   Allergies No Known Allergies  Family History History reviewed. No pertinent family history.  Social History  reports that he has been smoking Cigarettes.  He has been smoking about .5 packs per day. He has never used smokeless tobacco. He reports that he does not drink alcohol or use illicit drugs.  Review Of Systems  Patient unable to provide  Vital Signs: Temp:  [96 F (35.6 C)-102.7 F (39.3 C)] 97.1 F (36.2 C) (04/24 0745) Pulse Rate:  [90-129] 90  (04/24 0730) Resp:  [0-32] 0  (  04/23 2000) BP: (91-127)/(48-66) 91/59 mmHg (04/24 0745) SpO2:  [87 %-100 %] 100 % (04/24 0745) Arterial Line BP: (92-173)/(45-122) 104/54 mmHg (04/24 0745) FiO2 (%):  [40 %-100 %] 70 % (04/24 0745) I/O last 3 completed shifts: In: 5076.6 [I.V.:2981.6; Blood:350; NG/GT:1310; IV Piggyback:435] Out: 1219 [Urine:80; Emesis/NG output:285; Other:854]  Physical Examination: General:  Intubated, mechanically ventilated, no acute distress Neuro:   Sedated, synchronous, nonfocal HEENT:  PERRL, pink conjunctivae, moist membranes Neck:  Supple, no JVD   Cardiovascular:  difficult to assess due to HFOV Lungs: difficult to assess due to HFOV Abdomen:  distended, difficult to assess BS due to HFOV Musculoskeletal:  Paralyzed Skin:  No rash  Ventilator settings: Vent Mode:  [-] PRVC FiO2 (%):  [40 %-100 %] 70 % Set Rate:  [30 bmp] 30 bmp Vt Set:  [380 mL] 380 mL PEEP:  [5 cmH20-10 cmH20] 10 cmH20 Mean Airway Pressure:  [30 cmH2O] 30 cmH2O Plateau Pressure:  [29 cmH20-32 cmH20] 32 cmH20  Labs and Imaging:  Reviewed.  Please refer to the Assessment and Plan section for relevant results.  ASSESSMENT AND PLAN  NEUROLOGIC A:  Acute encephalopathy (metabolic, medications).  History of Cocaine abuse. P: -->  Versed / Fentanyl gtt to goal RASS 0 to -1 -->  Nimbex as on HFOV -->  BIS / train of 4  PULMONARY  Lab 12/23/11 0424 12/22/11 2054 12/22/11 1941 12/22/11 1744 12/22/11 0437  PHART 7.234* 7.172* 7.199* 7.246* 7.249*  PCO2ART 58.3* 63.2* 55.1* 52.2* 50.5*  PO2ART 80.0 165.0* 129.0* 60.0* 79.5*  HCO3 24.5* 22.6 21.5 22.7 21.4  O2SAT 92.0 99.0 98.0 85.0 95.9   4/23  CXR >>> Lines / tubes in good position, bilateral airspace disease.  A:   Acute hypoxemic / hypercarbic respiratory failure.  ARDS vs pulmonary edema.  Suspected fat emboli.  Question "crack lung". Question DAH as also Hb drop. P:  -->  HIFOV, though not clear what constituted "failure of conventional ventilation" as PEEP was no higher then 10 -->  Goals SpO2 > 88%, pH > 7.25 -->  Consider IV steroids for possible "crack lung" if worsening oxygenation - anecdotal reports suggest benefit especially if underline problem is eosinophilic pneumonia. Will also help if DAH. -->  Bronchoscopy is contraindicated as severe hypoxemia  CARDIOVASCULAR  Lab 12/23/11 0418 12/21/11 0410 12/20/11 0945 12/20/11 0610 12/19/11 2040 12/19/11 1335 12/19/11 0346  TROPONINI -- -- --  >25.00* >25.00* >25.00* 0.79*  LATICACIDVEN -- -- 2.9* -- -- 3.8* --  PROBNP 16645.0* 3459.0* -- -- -- -- 118.4   4/20  ECHO EF 25%, some regional wall motion abnormalities.  A:  NSTEMI, likely intraoperative (on the background of poorly controlled DM) vs cocaine induced.  Acute cardiomyopathy / congestive heart failure.  Cardiogenic shock, less likely septic. Heparin gtt x 48 hours completed. -->  D/c Metoprolol as in shock -->  Holding ASA, Plavix as acute anemia / unknown source -->  Levophed, goal MAP > 60 -->  Will add Milrinone as positive inotrope also decrease pulmonary vascular resitance (likely elevated secondary to severe hypotension) -->  Will consider placing PA catheter  RENAL  Lab 12/23/11 0418 12/23/11 0417 12/22/11 1753 12/22/11 1132 12/22/11 0415 12/21/11 0410 12/19/11 0346  NA 138 -- 137 138 133* 137 --  K 6.0* -- 5.5* -- -- -- --  CL 101 -- 105 107 104 107 --  CO2 22 -- 22 24 22 24  --  BUN 50* -- 47* 42* 40* 20 --  CREATININE 5.74* --  5.52* 4.91* 4.20* 1.21 --  CALCIUM 7.2* -- 8.0* 7.8* 7.7* 7.5* --  MG -- 2.2 -- -- -- -- 1.6  PHOS -- 6.7* -- -- -- -- 4.1   CVP = 30  A:  Acute renal failure / AKI.  Hyperkalemia.  Unknown fluid status. P: -->  SCVP checks q4h, goal < 4 if BP tolerates -->  CVVHF, goal negative 100 mL/h -->  BMP in AM -->  D/c bicarbonate gtt  GASTROINTESTINAL  Lab 12/23/11 0418 12/22/11 0415 12/21/11 0410 12/20/11 0610 12/19/11 1330  AST 52* 73* 104* 212* 164*  ALT 50 59* 59* 83* 54*  ALKPHOS 117 101 83 88 71  BILITOT 0.5 0.4 0.5 0.9 0.8  PROT 4.5* 5.2* 4.7* 4.6* 4.4*  ALBUMIN 1.8* 2.0* 2.0* 2.2* 2.2*   A:  Shocked liver.  Ileus.  Malnutrition.  P: -->  Hold TF while on HFVO -->  GI Px  HEMATOLOGIC  Lab 12/23/11 0418 12/22/11 0415 12/21/11 0410 12/20/11 2250 12/20/11 1948 12/20/11 0430 12/19/11 2040 12/19/11 0346  HGB 8.0* 6.8* 7.4* 8.1* 8.7* -- -- --  HCT 24.8* 21.8* 22.6* 24.4* 27.0* -- -- --  PLT 300 249 253 270 212 -- --  --  INR -- -- -- -- -- 1.47 1.40 1.13  APTT 45* -- -- -- -- 48* 29 --   A: Acute anemia.  No overt hemorrhage.  Question DAH as above. P: --> CBC an AM  INFECTIOUS  Lab 12/23/11 0418 12/22/11 0415 12/21/11 0410 12/20/11 2250 12/20/11 1948 12/20/11 0945 12/19/11 1330  WBC 18.0* 12.5* 12.0* 12.5* 10.9* -- --  PROCALCITON 9.54 17.52 -- -- -- 7.20 8.56   A:  Suspected pneumonia. P: -->  Continue Zosyn / Vancomycin  -->  Add Levaquin for atypicals / double G- coverage  ENDOCRINE  Lab 12/23/11 0617 12/23/11 0515 12/23/11 0415 12/23/11 0313 12/23/11 0214  GLUCAP 227* 190* 231* 177* 181*   THS 0.07 Cortisol 30.4  A:  DM, hyperglycemia. Normal adrenal / thyroid function. P: -->  Insulin gtt  MUSCULOSKELETAL: A:  Tibia fracture P:   Per Ortho  BEST PRACTICE / DISPOSITION -->  ICU status under PCCM -->  Cardiology, Nephrology and Ortho consulting -->  Full code -->  NPO -->  Heparin for DVT Px -->  Protonix IV for GI Px  Patient examined.  Records reviewed.  Assessment and plan above is edited and discussed with ICU Resident Team.  The patient is critically ill with multiple organ systems failure and requires high complexity decision making for assessment and support, frequent evaluation and titration of therapies, application of advanced monitoring technologies and extensive interpretation of multiple databases. Critical Care Time devoted to patient care services described in this note is 60 minutes.  Orlean Bradford, M.D., F.C.C.P. Pulmonary and Critical Care Medicine [facility] Cell: [phone] Pager: [phone]  12/23/2011, 8:03 AM

## 2011-12-23 NOTE — Progress Notes (Signed)
Nutrition Follow-up  Patient transferred from [facility]; now on oscillator vent.  Noted plans to hold TF due to ileus.  Diet Order:  NPO  Previous order for Jevity 1.2 at 80 ml/h via OG tube has been discontinued.  Meds: Scheduled Meds:   . antiseptic oral rinse  15 mL Mouth Rinse QID  . aspirin  81 mg Per Tube Daily  . chlorhexidine  15 mL Mouth Rinse BID  . cisatracurium  10 mg Intravenous Once  . heparin  4,000 Units Intravenous Once  . heparin  4,000 Units Intravenous Once  . heparin  5,000 Units Subcutaneous Q8H  . levofloxacin (LEVAQUIN) IV  250 mg Intravenous Q24H  . pantoprazole (PROTONIX) IV  40 mg Intravenous Q24H  . piperacillin-tazobactam (ZOSYN)  IV  2.25 g Intravenous Q6H  . sodium chloride  3 mL Intravenous Q12H  . sodium chloride      . vancomycin  1,000 mg Intravenous Q24H  . DISCONTD: antiseptic oral rinse  15 mL Mouth Rinse QID  . DISCONTD: artificial tears   Both Eyes Q8H  . DISCONTD: aspirin EC  81 mg Oral Daily  . DISCONTD: atorvastatin  40 mg Oral q1800  . DISCONTD: feeding supplement (JEVITY 1.2 CAL)  1,000 mL Per Tube Q24H  . DISCONTD: metoprolol  2.5 mg Intravenous Q6H  . DISCONTD: pantoprazole sodium  40 mg Per Tube Q1200  . DISCONTD: piperacillin-tazobactam (ZOSYN)  IV  2.25 g Intravenous Q8H  . DISCONTD: piperacillin-tazobactam (ZOSYN)  IV  3.375 g Intravenous Q8H   Continuous Infusions:   . sodium chloride 20 mL/hr (12/23/11 1004)  . cisatracurium (NIMBEX) infusion 5 mcg/kg/min (12/23/11 1019)  . fentaNYL infusion INTRAVENOUS 275 mcg/hr (12/23/11 0802)  . heparin 10,000 units/ 20 mL infusion syringe 1,350 Units/hr (12/23/11 1014)  . insulin (NOVOLIN-R) infusion 7.7 mL/hr at 12/23/11 0802  . midazolam (VERSED) infusion 8 mg/hr (12/23/11 0445)  . norepinephrine (LEVOPHED) Adult infusion 8 mcg/min (12/23/11 0630)  . dialysis replacement fluid (prismasate)    . dialysis replacement fluid (prismasate)    . dialysate (PRISMASATE)    . DISCONTD: sodium  chloride 50 mL/hr at 12/22/11 0800  . DISCONTD: dialysate (PRISMASATE) 1,000 mL/hr at 12/23/11 0859  . DISCONTD: dialysate (PRISMASATE) 1,000 mL/hr at 12/23/11 0648  . DISCONTD:  sodium bicarbonate infusion 1000 mL Stopped (12/23/11 0941)  . DISCONTD: sodium bicarbonate (isotonic) 1000 mL infusion 500 mL/hr at 12/23/11 0859  . DISCONTD: sodium bicarbonate (isotonic) 1000 mL infusion 500 mL/hr at 12/23/11 0911  . DISCONTD: sodium bicarbonate (isotonic) 1000 mL infusion 500 mL/hr at 12/23/11 1028   PRN Meds:.artificial tears, fentaNYL, heparin, heparin, heparin, midazolam, ondansetron (ZOFRAN) IV, DISCONTD: acetaminophen (TYLENOL) oral liquid 160 mg/5 mL, DISCONTD: ondansetron  Labs:  CMP     Component Value Date/Time   NA 138 12/23/2011 0418   K 6.0* 12/23/2011 0418   CL 101 12/23/2011 0418   CO2 22 12/23/2011 0418   GLUCOSE 234* 12/23/2011 0418   BUN 50* 12/23/2011 0418   CREATININE 5.74* 12/23/2011 0418   CALCIUM 7.2* 12/23/2011 0418   PROT 4.5* 12/23/2011 0418   ALBUMIN 1.8* 12/23/2011 0418   AST 52* 12/23/2011 0418   ALT 50 12/23/2011 0418   ALKPHOS 117 12/23/2011 0418   BILITOT 0.5 12/23/2011 0418   GFRNONAA 11* 12/23/2011 0418   GFRAA 12* 12/23/2011 0418     Intake/Output Summary (Last 24 hours) at 12/23/11 1058 Last data filed at 12/23/11 1019  Gross per 24 hour  Intake 2874.9 ml  Output  1866 ml  Net 1008.9 ml    Weight Status:  96 kg (up from 80.3 kg on 4/21 with positive fluid balance)  Re-estimated needs:  2065 kcals, 100-115 grams protein, ~2 liters fluid daily  Nutrition Dx:  Inadequate oral intake, ongoing.  Goal:  TF to meet 90-100% estimated needs, unmet.  Intervention:  When able to resume TF, recommend Oxepa at 20 ml/h, increase by 10 ml every 4 hours to goal rate of 50 ml/h with Prostat 30 ml BID to provide 1944 kcals, 105 grams protein, 942 ml free water daily.  Monitor:  TF initiation, tolerance of TF, labs, weight trend.   Hettie Holstein Pager #:   [phone]

## 2011-12-23 NOTE — Procedures (Signed)
Hemodialysis Catheter Insertion Procedure Note Reginald Gutierrez [id number] 1966/01/30  Procedure: Insertion of Central Venous Catheter Indications: hemodialysis  Procedure Details Consent: Risks of procedure as well as the alternatives and risks of each were explained to the (patient/caregiver).  Consent for procedure obtained. Time Out: Verified patient identification, verified procedure, site/side was marked, verified correct patient position, special equipment/implants available, medications/allergies/relevent history reviewed, required imaging and test results available.  Performed  Maximum sterile technique was used including antiseptics, cap, gloves, gown, hand hygiene, mask and sheet. Skin prep: Chlorhexidine; local anesthetic administered A antimicrobial bonded/coated triple lumen catheter was placed in the left internal jugular vein using the Seldinger technique. Ultrasound was used for vessel identification.  Evaluation Blood flow good Complications: No apparent complications Patient did tolerate procedure well. Chest X-ray ordered to verify placement.  CXR: pending.  Emmalyne Giacomo 12/23/2011, 12:03 AM

## 2011-12-24 ENCOUNTER — Inpatient Hospital Stay (HOSPITAL_COMMUNITY): Payer: Medicare Other

## 2011-12-24 DIAGNOSIS — G9340 Encephalopathy, unspecified: Secondary | ICD-10-CM

## 2011-12-24 LAB — POCT I-STAT 3, ART BLOOD GAS (G3+)
Acid-Base Excess: 3 mmol/L — ABNORMAL HIGH (ref 0.0–2.0)
Bicarbonate: 30.8 mEq/L — ABNORMAL HIGH (ref 20.0–24.0)
O2 Saturation: 88 %
Patient temperature: 98.1

## 2011-12-24 LAB — GLUCOSE, CAPILLARY
Glucose-Capillary: 129 mg/dL — ABNORMAL HIGH (ref 70–99)
Glucose-Capillary: 134 mg/dL — ABNORMAL HIGH (ref 70–99)
Glucose-Capillary: 136 mg/dL — ABNORMAL HIGH (ref 70–99)
Glucose-Capillary: 145 mg/dL — ABNORMAL HIGH (ref 70–99)
Glucose-Capillary: 149 mg/dL — ABNORMAL HIGH (ref 70–99)
Glucose-Capillary: 162 mg/dL — ABNORMAL HIGH (ref 70–99)
Glucose-Capillary: 188 mg/dL — ABNORMAL HIGH (ref 70–99)
Glucose-Capillary: 226 mg/dL — ABNORMAL HIGH (ref 70–99)
Glucose-Capillary: 273 mg/dL — ABNORMAL HIGH (ref 70–99)
Glucose-Capillary: 275 mg/dL — ABNORMAL HIGH (ref 70–99)

## 2011-12-24 LAB — CBC
HCT: 23.5 % — ABNORMAL LOW (ref 39.0–52.0)
HCT: 21.7 % — ABNORMAL LOW (ref 39.0–52.0)
Hemoglobin: 7.8 g/dL — ABNORMAL LOW (ref 13.0–17.0)
Hemoglobin: 7.2 g/dL — ABNORMAL LOW (ref 13.0–17.0)
MCHC: 33.2 g/dL (ref 30.0–36.0)
MCV: 88.3 fL (ref 78.0–100.0)
Platelets: 278 10*3/uL (ref 150–400)
RBC: 2.66 MIL/uL — ABNORMAL LOW (ref 4.22–5.81)
RBC: 2.52 MIL/uL — ABNORMAL LOW (ref 4.22–5.81)
WBC: 17.2 10*3/uL — ABNORMAL HIGH (ref 4.0–10.5)
WBC: 17.8 10*3/uL — ABNORMAL HIGH (ref 4.0–10.5)

## 2011-12-24 LAB — RENAL FUNCTION PANEL
Albumin: 1.9 g/dL — ABNORMAL LOW (ref 3.5–5.2)
Albumin: 1.9 g/dL — ABNORMAL LOW (ref 3.5–5.2)
BUN: 33 mg/dL — ABNORMAL HIGH (ref 6–23)
BUN: 26 mg/dL — ABNORMAL HIGH (ref 6–23)
CO2: 26 mEq/L (ref 19–32)
Chloride: 95 mEq/L — ABNORMAL LOW (ref 96–112)
Chloride: 98 mEq/L (ref 96–112)
Creatinine, Ser: 3.55 mg/dL — ABNORMAL HIGH (ref 0.50–1.35)
GFR calc non Af Amer: 19 mL/min — ABNORMAL LOW (ref >90)
GFR calc non Af Amer: 25 mL/min — ABNORMAL LOW (ref >90)
Phosphorus: 5.5 mg/dL — ABNORMAL HIGH (ref 2.3–4.6)
Potassium: 5.2 mEq/L — ABNORMAL HIGH (ref 3.5–5.1)
Potassium: 3.5 mEq/L (ref 3.5–5.1)

## 2011-12-24 LAB — POCT ACTIVATED CLOTTING TIME: Activated Clotting Time: 193 seconds

## 2011-12-24 LAB — PREPARE RBC (CROSSMATCH)

## 2011-12-24 MED ORDER — PRO-STAT SUGAR FREE PO LIQD
30.0000 mL | Freq: Every day | ORAL | Status: DC
  Administered 2011-12-24 – 2011-12-28 (×18): 30 mL
  Filled 2011-12-24 (×25): qty 30

## 2011-12-24 MED ORDER — PHENYLEPHRINE HCL 10 MG/ML IJ SOLN
30.0000 ug/min | INTRAVENOUS | Status: DC
  Administered 2011-12-24: 50 ug/min via INTRAVENOUS
  Filled 2011-12-24: qty 1

## 2011-12-24 MED ORDER — SODIUM CHLORIDE 0.9 % IV SOLN
INTRAVENOUS | Status: DC
  Administered 2011-12-24: 1.7 [IU]/h via INTRAVENOUS
  Filled 2011-12-24 (×2): qty 1

## 2011-12-24 MED ORDER — OXEPA PO LIQD
1000.0000 mL | ORAL | Status: DC
  Administered 2011-12-24: 1000 mL
  Filled 2011-12-24 (×2): qty 1000

## 2011-12-24 MED ORDER — PHENYLEPHRINE HCL 10 MG/ML IJ SOLN
30.0000 ug/min | INTRAVENOUS | Status: DC
  Administered 2011-12-24: 150 ug/min via INTRAVENOUS
  Administered 2011-12-24: 145 ug/min via INTRAVENOUS
  Administered 2011-12-25: 200 ug/min via INTRAVENOUS
  Administered 2011-12-26: 175 ug/min via INTRAVENOUS
  Administered 2011-12-26: 160 ug/min via INTRAVENOUS
  Administered 2011-12-26: 175 ug/min via INTRAVENOUS
  Administered 2011-12-27: 180 ug/min via INTRAVENOUS
  Administered 2011-12-27: 170 ug/min via INTRAVENOUS
  Administered 2011-12-27: 200 ug/min via INTRAVENOUS
  Administered 2011-12-27: 175 ug/min via INTRAVENOUS
  Administered 2011-12-28: 120 ug/min via INTRAVENOUS
  Administered 2011-12-28: 180 ug/min via INTRAVENOUS
  Administered 2011-12-28: 120 ug/min via INTRAVENOUS
  Administered 2011-12-29: 150 ug/min via INTRAVENOUS
  Administered 2011-12-29: 100 ug/min via INTRAVENOUS
  Administered 2011-12-29: 80 ug/min via INTRAVENOUS
  Administered 2011-12-29: 200 ug/min via INTRAVENOUS
  Administered 2011-12-30: 20 ug/min via INTRAVENOUS
  Filled 2011-12-24 (×28): qty 4

## 2011-12-24 MED ORDER — PRISMASOL BGK 0/2.5 32-2.5 MEQ/L IV SOLN
INTRAVENOUS | Status: DC
  Administered 2011-12-24 – 2011-12-25 (×6): via INTRAPERITONEAL
  Filled 2011-12-24 (×17): qty 5000

## 2011-12-24 NOTE — Progress Notes (Signed)
UR Completed.  Reginald Gutierrez Jane [phone] 12/24/2011  

## 2011-12-24 NOTE — Progress Notes (Signed)
SUBJECTIVE: Intubated and sedated. On CRRT.  BP 113/49  Pulse 125  Temp(Src) 97.2 F (36.2 C) (Core (Comment))  Resp 30  Ht 5\' 6"  (1.676 m)  Wt 94.5 kg (208 lb 5.4 oz)  BMI 33.63 kg/m2  SpO2 93%  Intake/Output Summary (Last 24 hours) at 12/24/11 0959 Last data filed at 12/24/11 0900  Gross per 24 hour  Intake 3188.88 ml  Output   4511 ml  Net -1322.12 ml    PHYSICAL EXAM General: Intubated and sedated.  Neck: No masses Lungs: Coarse breath sounds bilaterally. Difficult exam with oscillating ventilatorwith no wheezes or rhonci noted.  Heart: Tachy, exam difficult with oscillating ventilator and coarse diffuse rhonchi.  Abdomen: Soft   Extremities: warm to touch  LABS: Basic Metabolic Panel:  Basename 12/24/11 0246 12/23/11 1527 12/23/11 0417  NA 134* 138 --  K 5.2* 4.1 --  CL 95* 96 --  CO2 29 31 --  GLUCOSE 208* 139* --  BUN 33* 38* --  CREATININE 3.55* 4.20* --  CALCIUM 8.0* 7.6* --  MG 2.3 -- 2.2  PHOS 5.5* 5.5* --   CBC:  Basename 12/24/11 0246 12/23/11 1016 12/23/11 0418  WBC 17.2* -- 18.0*  NEUTROABS -- -- --  HGB 7.8* 7.1* --  HCT 23.5* 21.0* --  MCV 88.3 -- 90.2  PLT 278 -- 300    Current Meds:    . antiseptic oral rinse  15 mL Mouth Rinse QID  . aspirin  81 mg Per Tube Daily  . chlorhexidine  15 mL Mouth Rinse BID  . cisatracurium  10 mg Intravenous Once  . heparin  5,000 Units Subcutaneous Q8H  . levofloxacin (LEVAQUIN) IV  250 mg Intravenous Q24H  . pantoprazole (PROTONIX) IV  40 mg Intravenous Q24H  . piperacillin-tazobactam (ZOSYN)  IV  2.25 g Intravenous Q6H  . sodium chloride  3 mL Intravenous Q12H  . vancomycin  1,000 mg Intravenous Q24H  . DISCONTD: aspirin EC  81 mg Oral Daily  . DISCONTD: insulin aspart  0-7 Units Subcutaneous Q4H     ASSESSMENT AND PLAN: 46 yo male with h/o tobacco abuse, cocaine abuse, type 1 DM, HTN, HLD admitted to [facility] 12/18/11 after syncopal event sustaining bilateral tibial  fractures, now s/p surgical repair. On POD#1, pt had c/o dyspnea, was confused and hypoxic and was intubated. He has subsequently ruled in for MI with troponin over 25. Initial EKG on 12/19/11 with ST depression. He has also developed acute renal failure with minimal urine output and is now on CVVHD. Acute respiratory failure complicated by pneumonia, now on IV antibiotics and oscillating ventilation. Echo 12/19/11 with LVEF of 25-30% with akinesis of the mid-distal-anteroseptal and apical myocardium.   1. NSTEMI: Pt has multiple risk factors for CAD including tobacco abuse, cocaine abuse, long standing type 1 DM, HTN, HLD who is admitted after a syncopal event sustaining bilateral broken tibia. Echo with segmental LV dysfunction suggesting coronary occlusion. It is not clear if he had ACS leading to the syncope before admission. More likely that his ACS happened during or after surgery given the curve of his troponin.  He remains hypotensive likely due to septic and cardiogenic shock. IV beta blockers have been used as BP tolerated but currently on hold. He will need a cardiac cath when acute issues resolved. We cannot plan cardiac cath at this time given renal failure.     -Continue pressor support as necessary   - Continue CRRT per nephrology   -  ASA  when appropriate from bleeding standpoint.   - beta blockers on hold secondary to hypotension   -  We will follow with you.    Cassell Clement  4/25/20139:59 AM

## 2011-12-24 NOTE — Progress Notes (Signed)
Received a call at 3:20am that the patient has become tachycardic (sinus tach with rate 130/min) and hypotensive and was started on levophed, at the time his CVP was 14, last Free T4 1.6, and Hg 7.8. On chart review the patient has multiple medical problems including NSTEMI, Cardiogenic shock, ARF, and ARDS. For now I have changed his Levophed to neo-synephrine to avoid tachycardia related to Levophed. And Titrate down Milrinone as this may be one of the causes of his tachycardia. Await for morning labs for further trends of electrolytes and hemoglobin. If tachycardia does not resolve by morning, consider contacting cardiology.

## 2011-12-24 NOTE — Progress Notes (Signed)
Name: MARBIN OLSHEFSKI MRN: [id number] DOB: Mar 13, 1966  ELECTRONIC ICU PHYSICIAN NOTE  Problem:  Tf residuals on paralytics  Intervention:  Hold tf for tonight  Sandrea Hughs 12/24/2011, 8:39 PM

## 2011-12-24 NOTE — Progress Notes (Signed)
Subjective: Started on CRRT early this morning due to worsening acidosis and hyperK+  Objective Vital signs in last 24 hours: Filed Vitals:   12/24/11 0807 12/24/11 0900 12/24/11 1000 12/24/11 1100  BP: 113/49 98/58 104/56 96/55  Pulse:  125 122 117  Temp:  96.7 F (35.9 C) 95.9 F (35.5 C) 97 F (36.1 C)  TempSrc:      Resp: 30     Height:      Weight:      SpO2: 93% 95% 96% 98%   Weight change:   Intake/Output Summary (Last 24 hours) at 12/24/11 1150 Last data filed at 12/24/11 1102  Gross per 24 hour  Intake 3512.67 ml  Output   4530 ml  Net -1017.33 ml   Labs: Basic Metabolic Panel:  Lab 12/24/11 1610 12/23/11 1527 12/23/11 1025 12/23/11 1016 12/23/11 0418 12/23/11 0417 12/22/11 1753 12/22/11 1132 12/22/11 0415 12/19/11 0346  NA 134* 138 141 141 138 -- 137 138 -- --  K 5.2* 4.1 3.1* 3.1* 6.0* -- 5.5* 5.1 -- --  CL 95* 96 98 98 101 -- 105 107 -- --  CO2 29 31 34* -- 22 -- 22 24 22  --  GLUCOSE 208* 139* 99 98 234* -- 102* 99 -- --  BUN 33* 38* 40* 38* 50* -- 47* 42* -- --  CREATININE 3.55* 4.20* 4.47* 4.60* 5.74* -- 5.52* 4.91* -- --  ALB -- -- -- -- -- -- -- -- -- --  CALCIUM 8.0* 7.6* 6.9* -- 7.2* -- 8.0* 7.8* 7.7* --  PHOS 5.5* 5.5* -- -- -- 6.7* -- -- -- 4.1   Liver Function Tests:  Lab 12/24/11 0246 12/23/11 1527 12/23/11 0418 12/22/11 0415 12/21/11 0410  AST -- -- 52* 73* 104*  ALT -- -- 50 59* 59*  ALKPHOS -- -- 117 101 83  BILITOT -- -- 0.5 0.4 0.5  PROT -- -- 4.5* 5.2* 4.7*  ALBUMIN 1.9* 1.9* 1.8* -- --   No results found for this basename: LIPASE:3,AMYLASE:3 in the last 168 hours No results found for this basename: AMMONIA:3 in the last 168 hours CBC:  Lab 12/24/11 0246 12/23/11 1016 12/23/11 0418 12/22/11 0415 12/21/11 0410 12/20/11 0430 12/19/11 0346 12/18/11 1410  WBC 17.2* -- 18.0* 12.5* 12.0* -- -- --  NEUTROABS -- -- -- -- 9.0* 7.4 12.6* 16.1*  HGB 7.8* 7.1* 8.0* 6.8* -- -- -- --  HCT 23.5* 21.0* 24.8* 21.8* -- -- -- --  MCV 88.3 -- 90.2  93.2 90.0 -- -- --  PLT 278 -- 300 249 253 -- -- --   PT/INR: @labrcntip (inr:5) Cardiac Enzymes:  Lab 12/20/11 0610 12/19/11 2040 12/19/11 1335 12/19/11 0346  CKTOTAL 972* 1658* 1478* 488*  CKMB 48.2* 261.2* 207.5* 13.5*  CKMBINDEX -- -- -- --  TROPONINI >25.00* >25.00* >25.00* 0.79*   CBG:  Lab 12/24/11 0739 12/24/11 0342 12/23/11 2345 12/23/11 1855 12/23/11 1807  GLUCAP 226* 205* 188* 194* 173*    I  have reviewed scheduled and prn medications.  Physical Exam:  Blood pressure 96/55, pulse 117, temperature 97 F (36.1 C), temperature source Core (Comment), resp. rate 30, height 5\' 6"  (1.676 m), weight 94.5 kg (208 lb 5.4 oz), SpO2 98.00%.  Gen: on vent sedated Skin: no rash, cyanosis  Neck: no JVD, bruits or LAN  Chest: coarse BS, no fluid in the ETT  Heart: regular, no rub or gallop  Abdomen: soft, nondistended, + BS  Ext: diffuse nonpitting edema 2+ of all extremities  Neuro: alert,  Ox3, no focal deficit  Heme/Lymph: no bruising or LAN   Assessment/Recommendations  1. AKI due to ATN from combination of IV dye and shock- continue CVVHD day #2. Resume low K dialysate. On heparin AC for CRRT. Pulling fluid at 100cc/hr, will increase to UF -150cc/hr. Marked volume overload.  Pressors are being weaned down and possibly off today. 2. Hyperkalemia- better 3. Metabolic acidosis- resolved. Has a resp acidosis today. 4. Acute NSTEMI with ++ CE's and wall motion abnormalities on ECHO 5. TYPE I DM 6. Asp pneumonia 7. VDRF on full support 8. S/p fall with bilat LE tibial fracture   Reginald Moselle  MD [facility] [phone] pgr    [phone] cell 12/24/2011, 11:50 AM

## 2011-12-24 NOTE — Progress Notes (Signed)
Nutrition Follow-up / Consult  OG tube in place; Received consult for TF initiation and management; Patient on ARDS Protocol; also receiving CRRT.  Diet Order:  NPO  Meds: Scheduled Meds:   . antiseptic oral rinse  15 mL Mouth Rinse QID  . aspirin  81 mg Per Tube Daily  . chlorhexidine  15 mL Mouth Rinse BID  . cisatracurium  10 mg Intravenous Once  . heparin  5,000 Units Subcutaneous Q8H  . levofloxacin (LEVAQUIN) IV  250 mg Intravenous Q24H  . pantoprazole (PROTONIX) IV  40 mg Intravenous Q24H  . piperacillin-tazobactam (ZOSYN)  IV  2.25 g Intravenous Q6H  . sodium chloride  3 mL Intravenous Q12H  . vancomycin  1,000 mg Intravenous Q24H  . DISCONTD: insulin aspart  0-7 Units Subcutaneous Q4H   Continuous Infusions:   . sodium chloride 20 mL/hr (12/23/11 1004)  . cisatracurium (NIMBEX) infusion 3 mcg/kg/min (12/24/11 0200)  . fentaNYL infusion INTRAVENOUS 175 mcg/hr (12/24/11 0400)  . heparin 10,000 units/ 20 mL infusion syringe 1,650 Units/hr (12/24/11 0800)  . insulin (NOVOLIN-R) infusion 4.3 Units/hr (12/24/11 1015)  . midazolam (VERSED) infusion 4 mg/hr (12/24/11 0400)  . milrinone 0.125 mcg/kg/min (12/24/11 0844)  . phenylephrine (NEO-SYNEPHRINE) Adult infusion 150 mcg/min (12/24/11 0650)  . dialysate (PRISMASATE) 2,000 mL/hr at 12/24/11 0917  . dialysis replacement fluid (prismasate) 200 mL/hr at 12/24/11 0600  . dialysis replacement fluid (prismasate) 200 mL/hr at 12/24/11 0600  . DISCONTD: insulin (NOVOLIN-R) infusion 7.7 mL/hr at 12/23/11 0802  . DISCONTD: norepinephrine (LEVOPHED) Adult infusion 15 mcg/min (12/24/11 0650)  . DISCONTD: phenylephrine (NEO-SYNEPHRINE) Adult infusion Stopped (12/24/11 0615)  . DISCONTD: dialysate (PRISMASATE)    . DISCONTD: dialysate (PRISMASATE) 2,000 mL/hr at 12/24/11 0300  . DISCONTD: sodium bicarbonate (isotonic) 1000 mL infusion 500 mL/hr at 12/23/11 0911  . DISCONTD: sodium bicarbonate (isotonic) 1000 mL infusion 500 mL/hr at  12/23/11 1028   PRN Meds:.artificial tears, fentaNYL, heparin, heparin, heparin, midazolam, ondansetron (ZOFRAN) IV  Labs:  CMP     Component Value Date/Time   NA 134* 12/24/2011 0246   K 5.2* 12/24/2011 0246   CL 95* 12/24/2011 0246   CO2 29 12/24/2011 0246   GLUCOSE 208* 12/24/2011 0246   BUN 33* 12/24/2011 0246   CREATININE 3.55* 12/24/2011 0246   CALCIUM 8.0* 12/24/2011 0246   PROT 4.5* 12/23/2011 0418   ALBUMIN 1.9* 12/24/2011 0246   AST 52* 12/23/2011 0418   ALT 50 12/23/2011 0418   ALKPHOS 117 12/23/2011 0418   BILITOT 0.5 12/23/2011 0418   GFRNONAA 19* 12/24/2011 0246   GFRAA 22* 12/24/2011 0246   CBG (last 3)   Basename 12/24/11 0739 12/24/11 0342 12/23/11 2345  GLUCAP 226* 205* 188*     Intake/Output Summary (Last 24 hours) at 12/24/11 1018 Last data filed at 12/24/11 1015  Gross per 24 hour  Intake 3417.67 ml  Output   4456 ml  Net -1038.33 ml    Weight Status:  94.5 kg down from 96 kg yesterday; Weight has trended up from admission weight of 80.3 kg due to positive fluid balance.    Re-estimated needs using admission weight of 80.3 kg:  2050 kcals, 140-160 grams protein daily  Nutrition Dx:  Inadequate oral intake, ongoing.  Goal:  TF to meet 90-100% estimated needs, unmet.  Intervention:  Start TF with Oxepa at 15 ml/h, increase by 10 ml every 4 hours to goal rate of 45 ml/h with Prostat 30 ml 5 times daily to provide 1980 kcals, 143 grams  protein, 848 ml free water daily.  Monitor:  TF tolerance, labs, weight gain.   Hettie Holstein Pager #:  [phone]

## 2011-12-24 NOTE — Progress Notes (Signed)
Subjective: Pt sedated on multiple drips on the vent.  Objective: Vital signs in last 24 hours: Temp:  [93.7 F (34.3 C)-99.2 F (37.3 C)] 95 F (35 C) (04/25 1500) Pulse Rate:  [74-133] 94  (04/25 1500) Resp:  [28-35] 30  (04/25 1533) BP: (76-116)/(43-59) 112/52 mmHg (04/25 1533) SpO2:  [72 %-100 %] 100 % (04/25 1500) Arterial Line BP: (88-125)/(37-66) 100/47 mmHg (04/25 1500) FiO2 (%):  [40 %-100 %] 50 % (04/25 1533) Weight:  [94.5 kg (208 lb 5.4 oz)] 94.5 kg (208 lb 5.4 oz) (04/25 0400)  Intake/Output from previous day: 04/24 0701 - 04/25 0700 In: 3327.8 [I.V.:2887.8; NG/GT:30; IV Piggyback:310] Out: 4613 [Urine:255; Emesis/NG output:200; Stool:50] Intake/Output this shift: Total I/O In: 1544.9 [I.V.:1244.9; IV Piggyback:300] Out: 1989 [Other:1989]   Basename 12/24/11 0246 12/23/11 1016 12/23/11 0418 12/22/11 0415  HGB 7.8* 7.1* 8.0* 6.8*    Basename 12/24/11 0246 12/23/11 1016 12/23/11 0418  WBC 17.2* -- 18.0*  RBC 2.66* -- 2.75*  HCT 23.5* 21.0* --  PLT 278 -- 300    Basename 12/24/11 0246 12/23/11 1527  NA 134* 138  K 5.2* 4.1  CL 95* 96  CO2 29 31  BUN 33* 38*  CREATININE 3.55* 4.20*  GLUCOSE 208* 139*  CALCIUM 8.0* 7.6*   No results found for this basename: LABPT:2,INR:2 in the last 72 hours  Pt vented sedated. Bilateral legs wounds well approx with staples, no signs of infection, DP pulses present with doppler. Calfs soft.  Assessment/Plan: POD # 6 S/P Bilateral tibial IM nailing due FX Stable.  Plan- Daily dry dressing changes, elevation.   Jamelle Rushing 12/24/2011, 3:34 PM

## 2011-12-24 NOTE — Progress Notes (Signed)
Summary for night shift 12-23-11   Initially at beginning of shift pt hypothermic, ST 110-115, BP WNL not requiring pressors  As midnight approached pt's BP decreased with MAP consistently 58-60, his HR had increased to 120's,  tried to initially decrease sedation and adjust CRRT to not take off fluid but this did not help and levophed was started.  Pt HR increased to 130's CCM and Cardiology called, CCM said to transition pt off levo onto neo, cardiology said when levo lowered or off to decrease milrinone by half.  During the early am pt's pulse ox was reading 88%, no improvement with suctioning and 100% oxygen x .  RT notified.  Recruitment maneuver performed and FiO2 increased from 40% to 60% by RT.  Pt's temp increased to 99.2 via foley temp.  I removed the CRRT return line warmer and the bear hugger was removed.  I later decreased Milrinone to 0.1 mcg/kg/min (gardrails low limit)  Heparin syringe ran out and while waiting on a replacement syringe the TMP on the CRRT machine increased and the filter clotted.

## 2011-12-24 NOTE — Progress Notes (Signed)
Name: Reginald Gutierrez MRN: [id number] DOB: 19-Jun-1966    LOS: 6  PCCM PROGRESS NOTE Active Problems:  NSTEMI (non-ST elevated myocardial infarction)  Acute respiratory failure  Aspiration pneumonia  Acute renal failure  ARDS (adult respiratory distress syndrome)  Tibial fracture  Acute blood loss anemia  Metabolic encephalopathy  Protein calorie malnutrition  Hyperkalemia  Encephalopathy acute  HYPOTHYROIDISM  DIABETES MELLITUS, TYPE I  Ileus  History of Present Illness: 46 y/o with history of DM and cocaine abuse admitted with bilateral tibia fracture s/p syncopal episode on 4/19.  Postoperatively he developed acute hypoxia and refractory shock, transient ST depression and troponin leak.  Course is complicated by ARDS requiring HFOV and acute renal failure requiring CVVHD.  Lines / Drains: 4/20 OETT >>> 4/20  OGT >>> 4/20 R IJ CVL >>> 4/20 R fem A-line >>> 4/23 L IJ DH >>>  Cultures: 4/20  Blood >>> NTD 4/20  Sputum >>> NTD 4/23  Blood >>> NTD 4/23  Urine >>> NTD  Abx: Zosyn 4/20 (empirical, G-, anaerobes) >>> Vanc 4/20 (empirical, MRSA) >>> Levaquin 4/24 (empirical, atypical) >>>  Tests / Events: 4/19  Admission, repair of bilat tibia Fx 4/20  Refractory shock, hypoxemic resp failure, NSTEMI.  Intubated 4/20  CT head >>> NAD 4/20  TTE >>> EF 25 to 30%, akinesis of mid/distal anteroseptal and apical area 4/21  Chest CT angio >>> No central pulmonary embolism. Slight progression of right greater than left airspace disease. 4/23  Renal US >>> no hydronephrosis, no urine obstruction 4/23  HFOV due to refractory hypoxemia.  Vasopressors.  CVVHD. 4/24  Off HFVO  Subjective/interval: 1. Patient was transfused with 1 U of blood. Hbg is increased from 7.1 to 7.8.  2. Patient had tachycardic (sinus tach with rate 130/min) and hypotensive. Changed Levophed to neo-synephrine. Titrated down Milrinone to 0.1 mcg/kg/min  3. Switched HFOV to conventional ventilator.  Vital  Signs: Temp:  [93.7 F (34.3 C)-99.2 F (37.3 C)] 99.1 F (37.3 C) (04/25 0500) Pulse Rate:  [74-133] 126  (04/25 0500) Resp:  [28-35] 35  (04/25 0338) BP: (81-126)/(44-105) 101/54 mmHg (04/25 0500) SpO2:  [72 %-100 %] 97 % (04/25 0500) Arterial Line BP: (90-125)/(42-66) 102/50 mmHg (04/25 0500) FiO2 (%):  [40 %-100 %] 60 % (04/25 0338) Weight:  [208 lb 5.4 oz (94.5 kg)] 208 lb 5.4 oz (94.5 kg) (04/25 0400) I/O last 3 completed shifts: In: 4701.7 [I.V.:3491.7; Blood:350; NG/GT:540; IV Piggyback:320] Out: 3600 [Urine:130; Emesis/NG output:285; Other:3185]  Physical Examination: General:  Intubated, mechanically ventilated, no acute distress Neuro:  Sedated, synchronous, nonfocal HEENT:  PERRL, pink conjunctivae, moist membranes Neck:  Supple, no JVD   Cardiovascular:  RRR, no M/G/R Lungs: diffused rhonchi and rales bilaterally Abdomen:  distended, decreased BS.  Musculoskeletal:  Paralyzed Skin:  No rash  Ventilator settings: Vent Mode:  [-] PCV FiO2 (%):  [40 %-100 %] 60 % Set Rate:  [28 bmp-30 bmp] 30 bmp PEEP:  [10 cmH20-12 cmH20] 10 cmH20 Mean Airway Pressure:  [28 cmH2O-30 cmH2O] 28 cmH2O Plateau Pressure:  [29 cmH20-32 cmH20] 32 cmH20  Labs and Imaging:  Reviewed.  Please refer to the Assessment and Plan section for relevant results.  ASSESSMENT AND PLAN  NEUROLOGIC A:  Acute encephalopathy (metabolic, medications).  History of Cocaine abuse. P: -->  Versed / Fentanyl gtt to goal RASS 0 to -1 -->  Nimbex -->  No WUA as paralyzed   PULMONARY  Lab 12/24/11 0249 12/23/11 2014 12/23/11 1847 12/23/11 1740 12/23/11 1622  PHART 7.267* 7.319* 7.238* 7.242* 7.320*  PCO2ART 67.4* 63.0* 78.6* 79.3* 67.6*  PO2ART 64.0* 106.0* 82.0 56.0* 34.0*  HCO3 30.8* 32.8* 33.5* 34.5* 35.9*  O2SAT 88.0 98.0 93.0 84.0 66.0   4/25  CXR >>> Lines / tubes in good position, bilateral airspace disease, no significant changes since prior  A:   Acute hypoxemic / hypercarbic respiratory  failure.  ARDS vs pulmonary edema.   P:  -->  Pressure control ventilation -->  Goal pH > 7.25, SpO2 > 88, FiO2 < 0.5 -->  No SBT as paralized  CARDIOVASCULAR  Lab 12/23/11 0418 12/21/11 0410 12/20/11 0945 12/20/11 0610 12/19/11 2040 12/19/11 1335 12/19/11 0346  TROPONINI -- -- -- >25.00* >25.00* >25.00* 0.79*  LATICACIDVEN -- -- 2.9* -- -- 3.8* --  PROBNP 16645.0* 3459.0* -- -- -- -- 118.4   4/20  ECHO EF 25%, some regional wall motion abnormalities.  A:  NSTEMI, likely intraoperative (on the background of poorly controlled DM).  Acute cardiomyopathy / congestive heart failure.  Cardiogenic shock, less likely septic. -->  ASA, Heparin completed -->  Hold Metoprolol as in shock -->  Holding Plavix as acute blood loss anemia -->  For BP support would prefer to use positive inotrope and avoid agents increasing afterload given cardiogenic shock.  Increase Milrinone to original dosing.  Will taper off Levophed first and then Neo-Synephrine (given sinus tachycardia) -->  Now that is off HFOV would ask Cardiology opinion re cardiac cath / IABP placement  RENAL  Lab 12/24/11 0246 12/23/11 1527 12/23/11 1025 12/23/11 1016 12/23/11 0418 12/23/11 0417 12/22/11 1753 12/19/11 0346  NA 134* 138 141 141 138 -- -- --  K 5.2* 4.1 -- -- -- -- -- --  CL 95* 96 98 98 101 -- -- --  CO2 29 31 34* -- 22 -- 22 --  BUN 33* 38* 40* 38* 50* -- -- --  CREATININE 3.55* 4.20* 4.47* 4.60* 5.74* -- -- --  CALCIUM 8.0* 7.6* 6.9* -- 7.2* -- 8.0* --  MG 2.3 -- -- -- -- 2.2 -- 1.6  PHOS 5.5* 5.5* -- -- -- 6.7* -- 4.1   CVP = 12  A:  Acute renal failure / AKI.  Hyperkalemia.  Hyponatremia.  Hyperphosphatemia. P: --> CVVHD, increase goal to 150 mL/h if BP tolaerates --> CVP checks q4h, goal < 4 if BP tolerates --> BMP in AM  GASTROINTESTINAL  Lab 12/24/11 0246 12/23/11 1527 12/23/11 0418 12/22/11 0415 12/21/11 0410 12/20/11 0610 12/19/11 1330  AST -- -- 52* 73* 104* 212* 164*  ALT -- -- 50 59* 59* 83* 54*    ALKPHOS -- -- 117 101 83 88 71  BILITOT -- -- 0.5 0.4 0.5 0.9 0.8  PROT -- -- 4.5* 5.2* 4.7* 4.6* 4.4*  ALBUMIN 1.9* 1.9* 1.8* 2.0* 2.0* -- --   A:  Shocked liver.  Ileus.  Malnutrition.  P: -->  Re-start TF, now that is off HFOV -->  GI Px  HEMATOLOGIC  Lab 12/24/11 0246 12/23/11 1016 12/23/11 0418 12/22/11 0415 12/21/11 0410 12/20/11 2250 12/20/11 0430 12/19/11 2040 12/19/11 0346  HGB 7.8* 7.1* 8.0* 6.8* 7.4* -- -- -- --  HCT 23.5* 21.0* 24.8* 21.8* 22.6* -- -- -- --  PLT 278 -- 300 249 253 270 -- -- --  INR -- -- -- -- -- -- 1.47 1.40 1.13  APTT 69* -- 45* -- -- -- 48* 29 --   A: Acute anemia.  No overt hemorrhage. P: --> CBC an AM -->  Goal Hb > 8, given AMI  INFECTIOUS  Lab 12/24/11 0246 12/23/11 0418 12/22/11 0415 12/21/11 0410 12/20/11 2250 12/20/11 0945 12/19/11 1330  WBC 17.2* 18.0* 12.5* 12.0* 12.5* -- --  PROCALCITON -- 9.54 17.52 -- -- 7.20 8.56   A:  Suspected pneumonia. P: -->  Continue Zosyn / Vancomycin / Levaquin  ENDOCRINE  Lab 12/24/11 0342 12/23/11 2345 12/23/11 1855 12/23/11 1807 12/23/11 1720  GLUCAP 205* 188* 194* 173* 162*   THS 0.07 Cortisol 30.4  A:  IDDM, hyperglycemia. Normal adrenal / thyroid function. P: --> Insulin gtt  MUSCULOSKELETAL A:  Tibia fracture P:  Per Ortho  BEST PRACTICE / DISPOSITION -->  ICU status under PCCM -->  Cardiology, Nephrology and Ortho consulting -->  Full code -->  NPO -->  Heparin for DVT Px -->  Protonix IV for GI Px  Lorretta Harp, MD PGY1, Internal Medicine Teaching Service Pager: [phone]  12/24/2011, 6:17 AM  Patient examined.  Records reviewed.  Assessment and plan above is edited and discussed with ICU Resident Team.  The patient is critically ill with multiple organ systems failure and requires high complexity decision making for assessment and support, frequent evaluation and titration of therapies, application of advanced monitoring technologies and extensive interpretation of multiple  databases. Critical Care Time devoted to patient care services described in this note is 35 minutes.  Orlean Bradford, M.D., F.C.C.P. Pulmonary and Critical Care Medicine [facility] Cell: [phone] Pager: [phone]

## 2011-12-24 NOTE — Progress Notes (Signed)
Clinical Social Work Department BRIEF PSYCHOSOCIAL ASSESSMENT 12/24/2011  Patient:  RICKI, CLACK     Account Number:  [id number]     Admit date:  12/18/2011  Clinical Social Worker:  Margaree Mackintosh  Date/Time:  12/24/2011 04:37 PM  Referred by:  Physician  Date Referred:  12/24/2011 Referred for  Psychosocial assessment   Other Referral:   Interview type:  Family Other interview type:    PSYCHOSOCIAL DATA Living Status:  ALONE Admitted from facility:   Level of care:   Primary support name:  [phone] Primary support relationship to patient:  PARENT Degree of support available:   adequate    CURRENT CONCERNS Current Concerns  Adjustment to Illness   Other Concerns:    SOCIAL WORK ASSESSMENT / PLAN Clinical Social Worker phoned pt's mother to offer support. CSW introduced self and explained role.  CSW provided support and opportunity for mother to process difficult feelings.  Mother shared that Oliver currently lives alone and has difficulties managing his diabetes.  Pt's mother is planning to come visit pt tomorrow with his sister (who is a Charity fundraiser at [employer]).  Mother shared that Torrell enjoys music and sports and has two close friends.  CSW to continue to follow and assist as needed.   Assessment/plan status:  Psychosocial Support/Ongoing Assessment of Needs Other assessment/ plan:   Information/referral to community resources:    PATIENT'S/FAMILY'S RESPONSE TO PLAN OF CARE: Pt currently unable to participate in assessment, mother was pleasant and appropriately engaged. Mother was receptive to CSW intervention.        Angelia Mould, MSW, [place] [phone]

## 2011-12-25 ENCOUNTER — Inpatient Hospital Stay (HOSPITAL_COMMUNITY): Payer: Medicare Other

## 2011-12-25 LAB — RENAL FUNCTION PANEL
Albumin: 1.9 g/dL — ABNORMAL LOW (ref 3.5–5.2)
BUN: 29 mg/dL — ABNORMAL HIGH (ref 6–23)
CO2: 28 mEq/L (ref 19–32)
CO2: 26 mEq/L (ref 19–32)
Calcium: 8.5 mg/dL (ref 8.4–10.5)
Chloride: 98 mEq/L (ref 96–112)
Chloride: 97 mEq/L (ref 96–112)
Creatinine, Ser: 2.55 mg/dL — ABNORMAL HIGH (ref 0.50–1.35)
GFR calc Af Amer: 33 mL/min — ABNORMAL LOW (ref >90)
GFR calc non Af Amer: 29 mL/min — ABNORMAL LOW (ref >90)
GFR calc non Af Amer: 28 mL/min — ABNORMAL LOW (ref >90)
Glucose, Bld: 158 mg/dL — ABNORMAL HIGH (ref 70–99)
Potassium: 3.5 mEq/L (ref 3.5–5.1)

## 2011-12-25 LAB — CULTURE, BLOOD (ROUTINE X 2)
Culture  Setup Time: 201304202147
Culture  Setup Time: 201304202147
Culture: NO GROWTH

## 2011-12-25 LAB — POCT ACTIVATED CLOTTING TIME
Activated Clotting Time: 193 seconds
Activated Clotting Time: 199 seconds
Activated Clotting Time: 177 seconds
Activated Clotting Time: 182 seconds
Activated Clotting Time: 193 seconds
Activated Clotting Time: 210 seconds

## 2011-12-25 LAB — POCT I-STAT 3, ART BLOOD GAS (G3+)
Acid-Base Excess: 1 mmol/L (ref 0.0–2.0)
O2 Saturation: 90 %
TCO2: 32 mmol/L (ref 0–100)
pCO2 arterial: 66 mmHg (ref 35.0–45.0)
pO2, Arterial: 64 mmHg — ABNORMAL LOW (ref 80.0–100.0)

## 2011-12-25 LAB — MAGNESIUM: Magnesium: 2.6 mg/dL — ABNORMAL HIGH (ref 1.5–2.5)

## 2011-12-25 LAB — TYPE AND SCREEN
ABO/RH(D): A POS
Antibody Screen: NEGATIVE

## 2011-12-25 LAB — CBC
Hemoglobin: 9.1 g/dL — ABNORMAL LOW (ref 13.0–17.0)
Platelets: 379 10*3/uL (ref 150–400)
RBC: 3.1 MIL/uL — ABNORMAL LOW (ref 4.22–5.81)
WBC: 21 10*3/uL — ABNORMAL HIGH (ref 4.0–10.5)

## 2011-12-25 LAB — APTT: aPTT: 85 seconds — ABNORMAL HIGH (ref 24–37)

## 2011-12-25 LAB — GLUCOSE, CAPILLARY
Glucose-Capillary: 167 mg/dL — ABNORMAL HIGH (ref 70–99)
Glucose-Capillary: 183 mg/dL — ABNORMAL HIGH (ref 70–99)
Glucose-Capillary: 164 mg/dL — ABNORMAL HIGH (ref 70–99)
Glucose-Capillary: 191 mg/dL — ABNORMAL HIGH (ref 70–99)
Glucose-Capillary: 242 mg/dL — ABNORMAL HIGH (ref 70–99)

## 2011-12-25 LAB — PROCALCITONIN: Procalcitonin: 7.08 ng/mL

## 2011-12-25 MED ORDER — SODIUM CHLORIDE 0.9 % IV SOLN
3.0000 ug/kg/min | INTRAVENOUS | Status: DC
  Filled 2011-12-25: qty 20

## 2011-12-25 MED ORDER — DEXTROSE 10 % IV SOLN: INTRAVENOUS | Status: DC

## 2011-12-25 MED ORDER — POTASSIUM CHLORIDE 10 MEQ/100ML IV SOLN
10.0000 meq | INTRAVENOUS | Status: AC
  Administered 2011-12-25 (×2): 10 meq via INTRAVENOUS
  Filled 2011-12-25 (×2): qty 100

## 2011-12-25 MED ORDER — INSULIN ASPART 100 UNIT/ML ~~LOC~~ SOLN
0.0000 [IU] | SUBCUTANEOUS | Status: DC
  Administered 2011-12-25: 2 [IU] via SUBCUTANEOUS
  Administered 2011-12-25: 4 [IU] via SUBCUTANEOUS

## 2011-12-25 MED ORDER — PRISMASOL BGK 4/2.5 32-4-2.5 MEQ/L IV SOLN
INTRAVENOUS | Status: DC
  Administered 2011-12-25 – 2011-12-26 (×2): via INTRAVENOUS_CENTRAL
  Filled 2011-12-25 (×15): qty 5000

## 2011-12-25 MED ORDER — NOREPINEPHRINE BITARTRATE 1 MG/ML IJ SOLN
2.0000 ug/min | INTRAMUSCULAR | Status: DC
  Administered 2011-12-25: 10 ug/min via INTRAVENOUS
  Administered 2011-12-26: 4 ug/min via INTRAVENOUS
  Filled 2011-12-25 (×5): qty 4

## 2011-12-25 MED ORDER — LEVOFLOXACIN IN D5W 250 MG/50ML IV SOLN
250.0000 mg | INTRAVENOUS | Status: AC
  Administered 2011-12-26 – 2011-12-29 (×4): 250 mg via INTRAVENOUS
  Filled 2011-12-25 (×4): qty 50

## 2011-12-25 MED ORDER — INSULIN GLARGINE 100 UNIT/ML ~~LOC~~ SOLN
24.0000 [IU] | SUBCUTANEOUS | Status: DC
  Administered 2011-12-25 – 2011-12-27 (×3): 24 [IU] via SUBCUTANEOUS

## 2011-12-25 MED ORDER — INSULIN ASPART 100 UNIT/ML ~~LOC~~ SOLN
0.0000 [IU] | SUBCUTANEOUS | Status: DC
  Administered 2011-12-25: 8 [IU] via SUBCUTANEOUS
  Administered 2011-12-25: 5 [IU] via SUBCUTANEOUS
  Administered 2011-12-25: 8 [IU] via SUBCUTANEOUS
  Administered 2011-12-25 – 2011-12-26 (×2): 3 [IU] via SUBCUTANEOUS
  Administered 2011-12-26: 100 [IU] via SUBCUTANEOUS
  Administered 2011-12-26: 3 [IU] via SUBCUTANEOUS
  Administered 2011-12-26: 5 [IU] via SUBCUTANEOUS
  Administered 2011-12-26: 100 [IU] via SUBCUTANEOUS
  Administered 2011-12-27: 3 [IU] via SUBCUTANEOUS
  Administered 2011-12-27: 8 [IU] via SUBCUTANEOUS
  Administered 2011-12-27: 3 [IU] via SUBCUTANEOUS
  Administered 2011-12-27: 5 [IU] via SUBCUTANEOUS
  Administered 2011-12-27: 3 [IU] via SUBCUTANEOUS
  Administered 2011-12-27: 8 [IU] via SUBCUTANEOUS
  Administered 2011-12-28: 3 [IU] via SUBCUTANEOUS
  Administered 2011-12-28 (×3): 8 [IU] via SUBCUTANEOUS
  Administered 2011-12-28: 2 [IU] via SUBCUTANEOUS
  Administered 2011-12-28: 3 [IU] via SUBCUTANEOUS
  Administered 2011-12-29 (×2): 5 [IU] via SUBCUTANEOUS
  Administered 2011-12-29 (×3): 3 [IU] via SUBCUTANEOUS
  Administered 2011-12-30: 13 [IU] via SUBCUTANEOUS
  Administered 2011-12-30 (×2): 5 [IU] via SUBCUTANEOUS
  Administered 2011-12-30 (×2): 2 [IU] via SUBCUTANEOUS
  Administered 2011-12-31: 3 [IU] via SUBCUTANEOUS
  Administered 2011-12-31: 5 [IU] via SUBCUTANEOUS
  Administered 2011-12-31: 2 [IU] via SUBCUTANEOUS
  Administered 2012-01-01 – 2012-01-02 (×3): 3 [IU] via SUBCUTANEOUS
  Administered 2012-01-02: 2 [IU] via SUBCUTANEOUS
  Administered 2012-01-03: 3 [IU] via SUBCUTANEOUS
  Administered 2012-01-03: 5 [IU] via SUBCUTANEOUS
  Administered 2012-01-03 – 2012-01-04 (×3): 2 [IU] via SUBCUTANEOUS
  Administered 2012-01-04: 5 [IU] via SUBCUTANEOUS
  Administered 2012-01-04: 3 [IU] via SUBCUTANEOUS
  Administered 2012-01-04: 5 [IU] via SUBCUTANEOUS
  Administered 2012-01-05 (×2): 2 [IU] via SUBCUTANEOUS
  Administered 2012-01-06 (×2): 3 [IU] via SUBCUTANEOUS
  Administered 2012-01-06: 8 [IU] via SUBCUTANEOUS
  Administered 2012-01-06: 5 [IU] via SUBCUTANEOUS
  Administered 2012-01-06: 11 [IU] via SUBCUTANEOUS
  Administered 2012-01-06: 2 [IU] via SUBCUTANEOUS
  Administered 2012-01-07 (×2): 11 [IU] via SUBCUTANEOUS
  Administered 2012-01-07: 5 [IU] via SUBCUTANEOUS
  Administered 2012-01-07 (×2): 3 [IU] via SUBCUTANEOUS

## 2011-12-25 MED ORDER — NOREPINEPHRINE BITARTRATE 1 MG/ML IJ SOLN
2.0000 ug/min | Freq: Once | INTRAVENOUS | Status: AC
  Administered 2011-12-25: 12 ug/min via INTRAVENOUS
  Filled 2011-12-25: qty 4

## 2011-12-25 NOTE — Progress Notes (Signed)
Name: Reginald Gutierrez MRN: [id number] DOB: [birth year]    LOS: 7  PCCM PROGRESS NOTE Active Problems:  HYPOTHYROIDISM  DIABETES MELLITUS, TYPE I  Tibial fracture  NSTEMI (non-ST elevated myocardial infarction)  Acute blood loss anemia  Acute respiratory failure  Aspiration pneumonia  Metabolic encephalopathy  Protein calorie malnutrition  Acute renal failure  Hyperkalemia  Ileus  ARDS (adult respiratory distress syndrome)  Encephalopathy acute  History of Present Illness: 46 y/o with history of DM and cocaine abuse admitted with bilateral tibia fracture s/p syncopal episode on 4/19.  Postoperatively he developed acute hypoxia and refractory shock, transient ST depression and troponin leak.  Course is complicated by ARDS requiring HFOV and acute renal failure requiring CVVHD.  Lines / Drains: 4/20 OETT >>> 4/20  OGT >>> 4/20 R IJ CVL >>> 4/20 R fem A-line >>> 4/23 L IJ DH >>>  Cultures: 4/20  Blood >>> NTD 4/20  Sputum >>> NTD 4/23  Blood >>> NTD 4/23  Urine >>> NTD  Abx: Zosyn 4/20 (empirical, G-, anaerobes) >>> Vanc 4/20 (empirical, MRSA) >>> Levaquin 4/24 (empirical, atypical) >>>  Tests / Events: 4/19  Admission, repair of bilat tibia Fx 4/20  Refractory shock, hypoxemic resp failure, NSTEMI.  Intubated 4/20  CT head >>> NAD 4/20  TTE >>> EF 25 to 30%, akinesis of mid/distal anteroseptal and apical area 4/21  Chest CT angio >>> No central pulmonary embolism. Slight progression of right greater than left airspace disease. 4/23  Renal US >>> no hydronephrosis, no urine obstruction 4/23  HFOV due to refractory hypoxemia.  Vasopressors.  CVVHD. 4/24  Off HFVO  Subjective/interval: 1. transfused 1 U of blood. Hbg increased from 7.2 to 9.1.  2. Off Levophed and Neo 3. Hold TF due to residuals   Vital Signs: Temp:  [95 F (35 C)-98 F (36.7 C)] 95.6 F (35.3 C) (04/26 0615) Pulse Rate:  [94-126] 114  (04/26 0615) Resp:  [28-30] 28  (04/26 0304) BP:  (94-129)/(49-61) 103/55 mmHg (04/26 0600) SpO2:  [86 %-100 %] 94 % (04/26 0624) Arterial Line BP: (96-133)/(44-57) 117/57 mmHg (04/26 0615) FiO2 (%):  [45 %-60 %] 50 % (04/26 0624) Weight:  [205 lb 0.4 oz (93 kg)] 205 lb 0.4 oz (93 kg) (04/26 0500) I/O last 3 completed shifts: In: 46 [I.V.:4520; Other:100; NG/GT:105; IV Piggyback:660] Out: 8295 [Urine:255; Emesis/NG output:200; AOZHY:8657; Stool:50]  Physical Examination: General:  Intubated, mechanically ventilated, no acute distress Neuro:  Sedated, synchronous, nonfocal HEENT:  PERRL, pink conjunctivae, moist membranes Neck:  Supple, no JVD   Cardiovascular:  RRR, no M/G/R Lungs: diffused rhonchi bilaterally Abdomen:  distended, decreased BS.  Musculoskeletal:  Paralyzed Skin:  No rash  Ventilator settings: Vent Mode:  [-] PCV FiO2 (%):  [45 %-60 %] 50 % Set Rate:  [30 bmp] 30 bmp PEEP:  [8 cmH20-10 cmH20] 8 cmH20 Plateau Pressure:  [27 cmH20-31 cmH20] 29 cmH20  Labs and Imaging:  Reviewed.  Please refer to the Assessment and Plan section for relevant results.  ASSESSMENT AND PLAN  NEUROLOGIC A:  Acute encephalopathy (metabolic, medications, anoxia, hypoglycemia).  History of Cocaine abuse (clean 5-10 years). P: -->  Versed / Fentanyl gtt to goal RASS 0 to -1 -->  Hold Nimbex (as now decreased oxygen requirements) -->  Attempt WUA if tolerates being off Nibmbex  PULMONARY  Lab 12/25/11 0513 12/24/11 0249 12/23/11 2014 12/23/11 1847 12/23/11 1740  PHART 7.254* 7.267* 7.319* 7.238* 7.242*  PCO2ART 66.0* 67.4* 63.0* 78.6* 79.3*  PO2ART 64.0* 64.0* 106.0*  82.0 56.0*  HCO3 29.8* 30.8* 32.8* 33.5* 34.5*  O2SAT 90.0 88.0 98.0 93.0 84.0   4/26  CXR >>> Stable support apparatus, improved aeration, bilateral effusions  A:   Acute hypoxemic / hypercarbic respiratory failure.  Pulmoanry edema more likely as baseline decreased EF and acute MI. Also, oxygenation improved significantly after fluid is off.  Less likely ARDS. P:   -->  Pressure control ventilation -->  Goal pH > 7.25, SpO2 > 88, FiO2 < 0.5 -->  Start daily SBT  CARDIOVASCULAR  Lab 12/23/11 0418 12/21/11 0410 12/20/11 0945 12/20/11 0610 12/19/11 2040 12/19/11 1335 12/19/11 0346  TROPONINI -- -- -- >25.00* >25.00* >25.00* 0.79*  LATICACIDVEN -- -- 2.9* -- -- 3.8* --  PROBNP 16645.0* 3459.0* -- -- -- -- 118.4   4/20  ECHO EF 25%, some regional wall motion abnormalities.  A:  NSTEMI, likely intraoperative (on the background of poorly controlled DM).  Acute cardiomyopathy / congestive heart failure.  Cardiogenic shock, less likely septic.  Transient hypotension this AM. -->  Continue ASA,  -->  Heparin completed -->  Hold Metoprolol as in shock -->  Holding Plavix as acute blood loss anemia -->  Continue Milrinone to optimize cardiac output -->  Neo-Synephrine (1st choice) then Levophed (2d choice) to keep MAP > 60 -->  Should consider cardiac cath / possible IABP  RENAL  Lab 12/25/11 0439 12/24/11 1638 12/24/11 0246 12/23/11 1527 12/23/11 1025 12/23/11 0417 12/19/11 0346  NA 135 134* 134* 138 141 -- --  K 3.4* 3.5 -- -- -- -- --  CL 98 98 95* 96 98 -- --  CO2 28 26 29 31  34* -- --  BUN 25* 26* 33* 38* 40* -- --  CREATININE 2.55* 2.87* 3.55* 4.20* 4.47* -- --  CALCIUM 8.5 8.2* 8.0* 7.6* 6.9* -- --  MG 2.6* -- 2.3 -- -- 2.2 1.6  PHOS 4.4 3.6 5.5* 5.5* -- 6.7* --   CVP = 18 I/O + - 3229  A:  Acute renal failure / AKI.  Hypokalemia. Hypermagnesemia. P: -->  CVVHD, increase goal to 150 mL/h if BP tolaerates -->  CVP checks q4h, goal < 4 if BP tolerates -->  Replace K -->  BMP in AM  GASTROINTESTINAL  Lab 12/25/11 0439 12/24/11 1638 12/24/11 0246 12/23/11 1527 12/23/11 0418 12/22/11 0415 12/21/11 0410 12/20/11 0610 12/19/11 1330  AST -- -- -- -- 52* 73* 104* 212* 164*  ALT -- -- -- -- 50 59* 59* 83* 54*  ALKPHOS -- -- -- -- 117 101 83 88 71  BILITOT -- -- -- -- 0.5 0.4 0.5 0.9 0.8  PROT -- -- -- -- 4.5* 5.2* 4.7* 4.6* 4.4*  ALBUMIN  2.1* 1.9* 1.9* 1.9* 1.8* -- -- -- --   A:  Shocked liver.  Ileus.  Malnutrition.  P: -->  TF -->  GI Px  HEMATOLOGIC  Lab 12/25/11 0439 12/24/11 1712 12/24/11 0246 12/23/11 1016 12/23/11 0418 12/22/11 0415 12/20/11 0430 12/19/11 2040 12/19/11 0346  HGB 9.1* 7.2* 7.8* 7.1* 8.0* -- -- -- --  HCT 27.4* 21.7* 23.5* 21.0* 24.8* -- -- -- --  PLT 379 326 278 -- 300 249 -- -- --  INR -- -- -- -- -- -- 1.47 1.40 1.13  APTT 85* -- 69* -- 45* -- 48* 29 --   A: Acute anemia.  No overt hemorrhage.  Transfused 1 unit of PRBC overnight. P: --> CBC an AM --> Goal Hb > 8, given AMI  INFECTIOUS  Lab  12/25/11 0439 12/24/11 1712 12/24/11 0246 12/23/11 0418 12/22/11 0415 12/20/11 0945 12/19/11 1330  WBC 21.0* 17.8* 17.2* 18.0* 12.5* -- --  PROCALCITON -- -- -- 9.54 17.52 7.20 8.56   A:  Suspected pneumonia. P: -->  Continue Zosyn / Vancomycin / Levaquin, stop date 4/30 -->  PCT today  ENDOCRINE  Lab 12/25/11 0512 12/25/11 0408 12/25/11 0322 12/25/11 0212 12/25/11 0107  GLUCAP 131* 139* 163* 183* 167*   TSH 0.07, Cortisol 30.4  A:  IDDM, hyperglycemia. History of hypothyroidism, but low TSH on admission. P: -->  D/c Insulin gtt -->  Start SSI/CBG -->  Recheck TSH  MUSCULOSKELETAL A:  Tibia fracture P:  Per Ortho  BEST PRACTICE / DISPOSITION -->  ICU status under PCCM -->  Cardiology, Nephrology and Ortho consulting -->  Full code -->  TF -->  Heparin for DVT Px -->  Protonix IV for GI Px -->  Family updated at the bdside  Lorretta Harp, MD PGY1, Internal Medicine Teaching Service Pager: [phone]  12/25/2011, 6:32 AM  Patient examined.  Records reviewed.  Assessment and plan above is edited and discussed with ICU Resident Team.  The patient is critically ill with multiple organ systems failure and requires high complexity decision making for assessment and support, frequent evaluation and titration of therapies, application of advanced monitoring technologies and extensive  interpretation of multiple databases. Critical Care Time devoted to patient care services described in this note is 45 minutes.  Orlean Bradford, M.D., F.C.C.P. Pulmonary and Critical Care Medicine [facility] Cell: [phone] Pager: [phone]

## 2011-12-25 NOTE — Progress Notes (Signed)
SUBJECTIVE: Intubated and sedated.  Off pressors now but still on low-dose milrinone.  Rhythm is sinus tachycardia BP 82/43  Pulse 122  Temp(Src) 98.5 F (36.9 C) (Core (Comment))  Resp 28  Ht 5\' 6"  (1.676 m)  Wt 93 kg (205 lb 0.4 oz)  BMI 33.09 kg/m2  SpO2 92%  Intake/Output Summary (Last 24 hours) at 12/25/11 1257 Last data filed at 12/25/11 1241  Gross per 24 hour  Intake 3055.09 ml  Output   6781 ml  Net -3725.91 ml    PHYSICAL EXAM General: Intubated and sedated.  Neck: No masses Lungs: Coarse breath sounds bilaterally. Difficult exam with oscillating ventilatorwith no wheezes or rhonci noted.  Heart: Tachy, exam difficult with oscillating ventilator and coarse diffuse rhonchi.  Abdomen: Soft   Extremities: warm to touch  LABS: Basic Metabolic Panel:  Basename 12/25/11 0439 12/24/11 1638 12/24/11 0246  NA 135 134* --  K 3.4* 3.5 --  CL 98 98 --  CO2 28 26 --  GLUCOSE 158* 147* --  BUN 25* 26* --  CREATININE 2.55* 2.87* --  CALCIUM 8.5 8.2* --  MG 2.6* -- 2.3  PHOS 4.4 3.6 --   CBC:  Basename 12/25/11 0439 12/24/11 1712  WBC 21.0* 17.8*  NEUTROABS -- --  HGB 9.1* 7.2*  HCT 27.4* 21.7*  MCV 88.4 86.1  PLT 379 326    Current Meds:    . antiseptic oral rinse  15 mL Mouth Rinse QID  . aspirin  81 mg Per Tube Daily  . chlorhexidine  15 mL Mouth Rinse BID  . cisatracurium  10 mg Intravenous Once  . feeding supplement  30 mL Per Tube 5 X Daily  . heparin  5,000 Units Subcutaneous Q8H  . insulin aspart  0-15 Units Subcutaneous Q4H  . insulin glargine  24 Units Subcutaneous Q24H  . levofloxacin (LEVAQUIN) IV  250 mg Intravenous Q24H  . norepinephrine (LEVOPHED) Adult infusion  2-50 mcg/min Intravenous Once  . pantoprazole (PROTONIX) IV  40 mg Intravenous Q24H  . potassium chloride  10 mEq Intravenous Q1 Hr x 2  . sodium chloride  3 mL Intravenous Q12H  . DISCONTD: insulin aspart  0-7 Units Subcutaneous Q4H  . DISCONTD: levofloxacin (LEVAQUIN) IV   250 mg Intravenous Q24H  . DISCONTD: piperacillin-tazobactam (ZOSYN)  IV  2.25 g Intravenous Q6H  . DISCONTD: vancomycin  1,000 mg Intravenous Q24H     ASSESSMENT AND PLAN: 46 yo male with h/o tobacco abuse, cocaine abuse, type 1 DM, HTN, HLD admitted to [facility] 12/18/11 after syncopal event sustaining bilateral tibial fractures, now s/p surgical repair. On POD#1, pt had c/o dyspnea, was confused and hypoxic and was intubated. He has subsequently ruled in for MI with troponin over 25. Initial EKG on 12/19/11 with ST depression. He has also developed acute renal failure with minimal urine output and is now on CVVHD. Acute respiratory failure complicated by pneumonia, now on IV antibiotics and oscillating ventilation. Echo 12/19/11 with LVEF of 25-30% with akinesis of the mid-distal-anteroseptal and apical myocardium.   1. NSTEMI: Pt has multiple risk factors for CAD including tobacco abuse, cocaine abuse, long standing type 1 DM, HTN, HLD who is admitted after a syncopal event sustaining bilateral broken tibia. Echo with segmental LV dysfunction suggesting coronary occlusion. It is not clear if he had ACS leading to the syncope before admission. More likely that his ACS happened during or after surgery given the curve of his troponin.  He remains  hypotensive likely due to septic and cardiogenic shock. IV beta blockers have been used as BP tolerated but currently on hold. He will need a cardiac cath when acute issues resolved. We cannot plan cardiac cath at this time given renal failure.     -Continue pressor support as necessary   - Continue CRRT per nephrology   - ASA  when appropriate from bleeding standpoint.   - Patient remains critically ill with multiorgan failure.  From a cardiac standpoint would continue current management.  Depending on clinical course over the weekend he may be ready for cardiac catheterization early next week.  No indication for intra-aortic balloon pump at this  time.   -  We will follow with you.    Cassell Clement  4/26/201312:57 PM

## 2011-12-25 NOTE — Progress Notes (Signed)
Spoke with MD Hyman Hopes to notify of second filter clotting off of the day, machine changed and filter changed at this time. MD stated that we will not restart heparin or start trisodium citrate for coagulation needs of CVVH, will stop CVVH if this filter clots and will readdress in am with MD Schertz. Blood flow rate at 100 for filter pressure purposes.

## 2011-12-25 NOTE — Progress Notes (Signed)
Subjective: Doing better, off pressors, kept on milrinone for low EF. Net - 3L yesterday  Objective Vital signs in last 24 hours: Filed Vitals:   12/25/11 0700 12/25/11 0800 12/25/11 0900 12/25/11 1000  BP: 102/53 107/52 97/47 117/60  Pulse: 115 114 118 123  Temp: 95.5 F (35.3 C) 96 F (35.6 C) 96.8 F (36 C) 97.7 F (36.5 C)  TempSrc:      Resp:      Height:      Weight:      SpO2: 92% 94% 98% 98%   Weight change: -1.5 kg (-3 lb 4.9 oz)  Intake/Output Summary (Last 24 hours) at 12/25/11 1054 Last data filed at 12/25/11 1001  Gross per 24 hour  Intake 3344.28 ml  Output   7168 ml  Net -3823.72 ml   Labs: Basic Metabolic Panel:  Lab 12/25/11 7829 12/24/11 1638 12/24/11 0246 12/23/11 1527 12/23/11 1025 12/23/11 1016 12/23/11 0418 12/23/11 0417 12/22/11 1753 12/19/11 0346  NA 135 134* 134* 138 141 141 138 -- -- --  K 3.4* 3.5 5.2* 4.1 3.1* 3.1* 6.0* -- -- --  CL 98 98 95* 96 98 98 101 -- -- --  CO2 28 26 29 31  34* -- 22 -- 22 --  GLUCOSE 158* 147* 208* 139* 99 98 234* -- -- --  BUN 25* 26* 33* 38* 40* 38* 50* -- -- --  CREATININE 2.55* 2.87* 3.55* 4.20* 4.47* 4.60* 5.74* -- -- --  ALB -- -- -- -- -- -- -- -- -- --  CALCIUM 8.5 8.2* 8.0* 7.6* 6.9* -- 7.2* -- 8.0* --  PHOS 4.4 3.6 5.5* 5.5* -- -- -- 6.7* -- 4.1   Liver Function Tests:  Lab 12/25/11 0439 12/24/11 1638 12/24/11 0246 12/23/11 0418 12/22/11 0415 12/21/11 0410  AST -- -- -- 52* 73* 104*  ALT -- -- -- 50 59* 59*  ALKPHOS -- -- -- 117 101 83  BILITOT -- -- -- 0.5 0.4 0.5  PROT -- -- -- 4.5* 5.2* 4.7*  ALBUMIN 2.1* 1.9* 1.9* -- -- --   No results found for this basename: LIPASE:3,AMYLASE:3 in the last 168 hours No results found for this basename: AMMONIA:3 in the last 168 hours CBC:  Lab 12/25/11 0439 12/24/11 1712 12/24/11 0246 12/23/11 1016 12/23/11 0418 12/21/11 0410 12/20/11 0430 12/19/11 0346 12/18/11 1410  WBC 21.0* 17.8* 17.2* -- 18.0* -- -- -- --  NEUTROABS -- -- -- -- -- 9.0* 7.4 12.6* 16.1*    HGB 9.1* 7.2* 7.8* 7.1* -- -- -- -- --  HCT 27.4* 21.7* 23.5* 21.0* -- -- -- -- --  MCV 88.4 86.1 88.3 -- 90.2 -- -- -- --  PLT 379 326 278 -- 300 -- -- -- --   PT/INR: @labrcntip (inr:5) Cardiac Enzymes:  Lab 12/20/11 0610 12/19/11 2040 12/19/11 1335 12/19/11 0346  CKTOTAL 972* 1658* 1478* 488*  CKMB 48.2* 261.2* 207.5* 13.5*  CKMBINDEX -- -- -- --  TROPONINI >25.00* >25.00* >25.00* 0.79*   CBG:  Lab 12/25/11 0512 12/25/11 0408 12/25/11 0322 12/25/11 0212 12/25/11 0107  GLUCAP 131* 139* 163* 183* 167*    I  have reviewed scheduled and prn medications.  Physical Exam:  Blood pressure 117/60, pulse 123, temperature 97.7 F (36.5 C), temperature source Core (Comment), resp. rate 28, height 5\' 6"  (1.676 m), weight 93 kg (205 lb 0.4 oz), SpO2 98.00%.  Gen: on vent sedated Skin: no rash, cyanosis  Neck: no JVD, bruits or LAN  Chest: coarse BS, no fluid in  the ETT  Heart: regular, no rub or gallop  Abdomen: soft, nondistended, + BS  Ext: diffuse nonpitting edema 2+ of all extremities  Neuro: alert, Ox3, no focal deficit  Heme/Lymph: no bruising or LAN   Assessment/Recommendations  1. AKI due to ATN from combination of IV dye and shock- continue CRRT day #3. Will resume 4K+ dialysate and stop heparin (ptt>80). Continue to remove volume as tolerated (3L net off yest). Off pressors.  2. Volume excess/pulm edema- removing fluid with CRRT, neg 75-150 cc/hr 3. Acute NSTEMI with ++ CE's and wall motion abnormalities on ECHO 4. TYPE I DM 5. Asp pneumonia 6. VDRF on full support 7. S/p fall with bilat LE tibial fracture   Vinson Moselle  MD [facility] [phone] pgr    [phone] cell 12/25/2011, 10:54 AM

## 2011-12-26 ENCOUNTER — Inpatient Hospital Stay (HOSPITAL_COMMUNITY): Payer: Medicare Other

## 2011-12-26 DIAGNOSIS — J9600 Acute respiratory failure, unspecified whether with hypoxia or hypercapnia: Secondary | ICD-10-CM

## 2011-12-26 LAB — TYPE AND SCREEN: Unit division: 0

## 2011-12-26 LAB — POCT I-STAT, CHEM 8
Calcium, Ion: 1.06 mmol/L — ABNORMAL LOW (ref 1.12–1.32)
Calcium, Ion: 0.45 mmol/L — CL (ref 1.12–1.32)
Glucose, Bld: 234 mg/dL — ABNORMAL HIGH (ref 70–99)
Glucose, Bld: 214 mg/dL — ABNORMAL HIGH (ref 70–99)
HCT: 23 % — ABNORMAL LOW (ref 39.0–52.0)
HCT: 26 % — ABNORMAL LOW (ref 39.0–52.0)
Hemoglobin: 7.8 g/dL — ABNORMAL LOW (ref 13.0–17.0)
Hemoglobin: 8.8 g/dL — ABNORMAL LOW (ref 13.0–17.0)
Potassium: 3.7 mEq/L (ref 3.5–5.1)
TCO2: 23 mmol/L (ref 0–100)

## 2011-12-26 LAB — GLUCOSE, CAPILLARY
Glucose-Capillary: 168 mg/dL — ABNORMAL HIGH (ref 70–99)
Glucose-Capillary: 218 mg/dL — ABNORMAL HIGH (ref 70–99)

## 2011-12-26 LAB — RENAL FUNCTION PANEL
Albumin: 1.9 g/dL — ABNORMAL LOW (ref 3.5–5.2)
Albumin: 1.8 g/dL — ABNORMAL LOW (ref 3.5–5.2)
BUN: 30 mg/dL — ABNORMAL HIGH (ref 6–23)
BUN: 38 mg/dL — ABNORMAL HIGH (ref 6–23)
Calcium: 8.2 mg/dL — ABNORMAL LOW (ref 8.4–10.5)
Calcium: 8 mg/dL — ABNORMAL LOW (ref 8.4–10.5)
Creatinine, Ser: 2.89 mg/dL — ABNORMAL HIGH (ref 0.50–1.35)
Creatinine, Ser: 3.58 mg/dL — ABNORMAL HIGH (ref 0.50–1.35)
GFR calc non Af Amer: 25 mL/min — ABNORMAL LOW (ref >90)
Glucose, Bld: 253 mg/dL — ABNORMAL HIGH (ref 70–99)
Phosphorus: 2.4 mg/dL (ref 2.3–4.6)
Phosphorus: 1.8 mg/dL — ABNORMAL LOW (ref 2.3–4.6)
Potassium: 3.2 mEq/L — ABNORMAL LOW (ref 3.5–5.1)

## 2011-12-26 LAB — TSH: TSH: 3.746 u[IU]/mL (ref 0.350–4.500)

## 2011-12-26 LAB — MAGNESIUM: Magnesium: 2.6 mg/dL — ABNORMAL HIGH (ref 1.5–2.5)

## 2011-12-26 MED ORDER — POTASSIUM CHLORIDE 20 MEQ/15ML (10%) PO LIQD
40.0000 meq | Freq: Once | ORAL | Status: AC
  Administered 2011-12-26: 40 meq
  Filled 2011-12-26: qty 30

## 2011-12-26 MED ORDER — POTASSIUM CHLORIDE 20 MEQ/15ML (10%) PO LIQD
40.0000 meq | Freq: Once | ORAL | Status: AC
  Administered 2011-12-26: 40 meq via ORAL
  Filled 2011-12-26: qty 30

## 2011-12-26 MED ORDER — PRISMASOL B22GK 4/0 22-4 MEQ/L IV SOLN
INTRAVENOUS | Status: DC
  Administered 2011-12-26 – 2011-12-27 (×6): via INTRAVENOUS_CENTRAL
  Filled 2011-12-26 (×33): qty 5000

## 2011-12-26 MED ORDER — ACETAMINOPHEN 160 MG/5ML PO SOLN
650.0000 mg | ORAL | Status: DC | PRN
  Administered 2011-12-26 – 2012-01-06 (×5): 650 mg
  Filled 2011-12-26 (×5): qty 20.3

## 2011-12-26 MED ORDER — DEXTROSE 5 % IV SOLN
Status: DC
  Administered 2011-12-26 – 2011-12-27 (×2): via INTRAVENOUS_CENTRAL
  Filled 2011-12-26 (×4): qty 1500

## 2011-12-26 MED ORDER — POTASSIUM CHLORIDE 20 MEQ/15ML (10%) PO LIQD
ORAL | Status: AC
  Administered 2011-12-26: 40 meq
  Filled 2011-12-26: qty 30

## 2011-12-26 MED ORDER — CALCIUM GLUCONATE 10 % IV SOLN
20.0000 g | INTRAVENOUS | Status: DC
  Administered 2011-12-26: 20 g via INTRAVENOUS_CENTRAL
  Filled 2011-12-26 (×3): qty 200

## 2011-12-26 NOTE — Progress Notes (Signed)
Orthopedics Progress Note  Subjective: Intubated and sedated.  Objective:  Filed Vitals:   12/26/11 1000  BP: 126/73  Pulse: 102  Temp: 99.2 F (37.3 C)  Resp: 17    General: Awake and alert  Musculoskeletal: Bilateral LEs:  Incisions benign, no erythema, staples in place. Compartments supple.  No response with PROM   Neurovascularly intact  Lab Results  Component Value Date   WBC 21.0* 12/25/2011   HGB 9.1* 12/25/2011   HCT 27.4* 12/25/2011   MCV 88.4 12/25/2011   PLT 379 12/25/2011       Component Value Date/Time   NA 132* 12/26/2011 0500   K 3.1* 12/26/2011 0500   CL 98 12/26/2011 0500   CO2 25 12/26/2011 0500   GLUCOSE 168* 12/26/2011 0500   BUN 30* 12/26/2011 0500   CREATININE 2.89* 12/26/2011 0500   CALCIUM 8.2* 12/26/2011 0500   GFRNONAA 25* 12/26/2011 0500   GFRAA 29* 12/26/2011 0500    Lab Results  Component Value Date   INR 1.47 12/20/2011   INR 1.40 12/19/2011   INR 1.13 12/19/2011    Assessment/Plan: POD #8 s/p Procedure(s): INTRAMEDULLARY (IM) NAIL TIBIAL Stable from orthopedic standpoint Continue skin protection PT would be beneficial for joint mobility while intubated.  Reginald Balls. Ranell Patrick, MD 12/26/2011 10:48 AM

## 2011-12-26 NOTE — Progress Notes (Signed)
Name: Reginald Gutierrez MRN: [id number] DOB: Oct 03, 1965    LOS: 8  PCCM PROGRESS NOTE Active Problems:  HYPOTHYROIDISM  DIABETES MELLITUS, TYPE I  Tibial fracture  NSTEMI (non-ST elevated myocardial infarction)  Acute blood loss anemia  Acute respiratory failure  Aspiration pneumonia  Metabolic encephalopathy  Protein calorie malnutrition  Acute renal failure  Hyperkalemia  Ileus  ARDS (adult respiratory distress syndrome)  Encephalopathy acute  History of Present Illness: 46 y/o with history of DM and cocaine abuse admitted with bilateral tibia fracture s/p syncopal episode on 4/19.  Postoperatively he developed acute hypoxia and refractory shock, transient ST depression and troponin leak.  Course is complicated by ARDS requiring HFOV and acute renal failure requiring CVVHD.  Lines / Drains: 4/20 OETT >>> 4/20  OGT >>> 4/20 R IJ CVL >>> 4/20 R fem A-line >>> 4/23 L IJ DH >>>  Cultures: 4/20  Blood >>> NTD 4/20  Sputum >>> NTD 4/23  Blood >>> NTD 4/23  Urine >>> NTD  Abx: Zosyn 4/20 (empirical, G-, anaerobes) >>> Vanc 4/20 (empirical, MRSA) >>> Levaquin 4/24 (empirical, atypical) >>>  Tests / Events: 4/19  Admission, repair of bilat tibia Fx 4/20  Refractory shock, hypoxemic resp failure, NSTEMI.  Intubated 4/20  CT head >>> NAD 4/20  TTE >>> EF 25 to 30%, akinesis of mid/distal anteroseptal and apical area 4/21  Chest CT angio >>> No central pulmonary embolism. Slight progression of right greater than left airspace disease. 4/23  Renal US >>> no hydronephrosis, no urine obstruction 4/23  HFOV due to refractory hypoxemia.  Vasopressors.  CVVHD. 4/24  Off HFOV  Subjective/interval: 1. Remains on Levophed and Neo last 24 h 2. Milrinone, minimal UOP 3. CVVHD clotted several times last 24h, not currently running  Vital Signs: Temp:  [96.8 F (36 C)-101.5 F (38.6 C)] 100.4 F (38 C) (04/27 0727) Pulse Rate:  [96-124] 96  (04/27 0727) Resp:  [12-31] 23  (04/27  0727) BP: (82-130)/(39-74) 106/47 mmHg (04/27 0727) SpO2:  [89 %-100 %] 100 % (04/27 0727) Arterial Line BP: (76-127)/(42-65) 92/42 mmHg (04/27 0700) FiO2 (%):  [40 %-50 %] 40 % (04/27 0727) Weight:  [90.8 kg (200 lb 2.8 oz)] 90.8 kg (200 lb 2.8 oz) (04/27 0455) I/O last 3 completed shifts: In: 4987.1 [I.V.:4027.1; Blood:350; NG/GT:260; IV Piggyback:350] Out: 7212 [Urine:69; Emesis/NG output:400; WUJWJ:1914; Stool:25]  Physical Examination: General:  Intubated, mechanically ventilated, no acute distress Neuro:  Sedated, synchronous, nonfocal HEENT:  PERRL, pink conjunctivae, moist membranes Neck:  Supple, no JVD   Cardiovascular:  RRR, no M/G/R Lungs: diffused rhonchi bilaterally Abdomen:  distended, decreased BS.  Musculoskeletal:  Paralyzed Skin:  No rash  Ventilator settings: Vent Mode:  [-] PCV FiO2 (%):  [40 %-50 %] 40 % Set Rate:  [30 bmp] 30 bmp PEEP:  [5 cmH20-18 cmH20] 18 cmH20 Plateau Pressure:  [19 cmH20-28 cmH20] 26 cmH20  Labs and Imaging:  Reviewed.  Please refer to the Assessment and Plan section for relevant results.  ASSESSMENT AND PLAN  NEUROLOGIC A:  Acute encephalopathy (metabolic, medications, anoxia, hypoglycemia).  History of Cocaine abuse (clean 5-10 years). P: -->  Versed / Fentanyl gtt to goal RASS 0 to -1 -->  Tolerated d/c Nimbex 4/26  -->  Plan WUA today  PULMONARY  Lab 12/25/11 0513 12/24/11 0249 12/23/11 2014 12/23/11 1847 12/23/11 1740  PHART 7.254* 7.267* 7.319* 7.238* 7.242*  PCO2ART 66.0* 67.4* 63.0* 78.6* 79.3*  PO2ART 64.0* 64.0* 106.0* 82.0 56.0*  HCO3 29.8* 30.8* 32.8* 33.5*  34.5*  O2SAT 90.0 88.0 98.0 93.0 84.0   4/27  CXR >>> No change B effusions and LL infiltrates/atx, L>R  A:   Acute hypoxemic / hypercarbic respiratory failure.  Pulmoanry edema more likely as baseline decreased EF and acute MI. Also, oxygenation improved significantly after fluid is off.  Less likely ARDS. P:  -->  Pressure control ventilation -->  Goal  pH > 7.25, SpO2 > 88, FiO2 < 0.5 -->  Start daily SBT -->  Consider thora to enhance volume removal  CARDIOVASCULAR  Lab 12/23/11 0418 12/21/11 0410 12/20/11 0945 12/20/11 0610 12/19/11 2040 12/19/11 1335  TROPONINI -- -- -- >25.00* >25.00* >25.00*  LATICACIDVEN -- -- 2.9* -- -- 3.8*  PROBNP 16645.0* 3459.0* -- -- -- --   4/20  ECHO EF 25%, some regional wall motion abnormalities.  A:  NSTEMI, likely intraoperative (on the background of poorly controlled DM).  Acute cardiomyopathy / congestive heart failure.  Cardiogenic shock, less likely septic.  Transient hypotension this AM. -->  Continue ASA,  -->  Heparin completed -->  Hold Metoprolol as in shock -->  Holding Plavix as acute blood loss anemia -->  Continue Milrinone to optimize cardiac output -->  Neo-Synephrine (1st choice) then Levophed (2d choice) to keep MAP > 60 -->  Should consider cardiac cath / possible IABP as we move forward -->  Follow BNP  RENAL  Lab 12/26/11 0500 12/25/11 1725 12/25/11 0439 12/24/11 1638 12/24/11 0246 12/23/11 0417  NA 132* 133* 135 134* 134* --  K 3.1* 3.5 -- -- -- --  CL 98 97 98 98 95* --  CO2 25 26 28 26 29  --  BUN 30* 29* 25* 26* 33* --  CREATININE 2.89* 2.57* 2.55* 2.87* 3.55* --  CALCIUM 8.2* 8.3* 8.5 8.2* 8.0* --  MG 2.6* -- 2.6* -- 2.3 2.2  PHOS 2.4 2.4 4.4 3.6 5.5* --   I/O last 3 completed shifts: In: 4987.1 [I.V.:4027.1; Blood:350; NG/GT:260; IV Piggyback:350] Out: 7212 [Urine:69; Emesis/NG output:400; JYNWG:9562; Stool:25]  A:  Acute renal failure / AKI.  Hypokalemia. Hypermagnesemia. P: -->  CVVHD restart today, renal following -->  CVP checks to qshift, goal < 4 if BP tolerates -->  Replace K -->  BMP in AM  GASTROINTESTINAL  Lab 12/26/11 0500 12/25/11 1725 12/25/11 0439 12/24/11 1638 12/24/11 0246 12/23/11 0418 12/22/11 0415 12/21/11 0410 12/20/11 0610 12/19/11 1330  AST -- -- -- -- -- 52* 73* 104* 212* 164*  ALT -- -- -- -- -- 50 59* 59* 83* 54*  ALKPHOS -- --  -- -- -- 117 101 83 88 71  BILITOT -- -- -- -- -- 0.5 0.4 0.5 0.9 0.8  PROT -- -- -- -- -- 4.5* 5.2* 4.7* 4.6* 4.4*  ALBUMIN 1.9* 1.9* 2.1* 1.9* 1.9* -- -- -- -- --   A:  Shocked liver.  Ileus.  Malnutrition.  P: -->  TF on hold for residuals, try to restart 4/27 -->  GI Px  HEMATOLOGIC  Lab 12/25/11 0439 12/24/11 1712 12/24/11 0246 12/23/11 1016 12/23/11 0418 12/22/11 0415 12/20/11 0430 12/19/11 2040  HGB 9.1* 7.2* 7.8* 7.1* 8.0* -- -- --  HCT 27.4* 21.7* 23.5* 21.0* 24.8* -- -- --  PLT 379 326 278 -- 300 249 -- --  INR -- -- -- -- -- -- 1.47 1.40  APTT 85* -- 69* -- 45* -- 48* 29   A: Acute anemia.  No overt hemorrhage.  Transfused 1 unit of PRBC overnight. P: --> CBC an AM -->  Goal Hb > 8, given AMI  INFECTIOUS  Lab 12/25/11 1030 12/25/11 0439 12/24/11 1712 12/24/11 0246 12/23/11 0418 12/22/11 0415 12/20/11 0945 12/19/11 1330  WBC -- 21.0* 17.8* 17.2* 18.0* 12.5* -- --  PROCALCITON 7.08 -- -- -- 9.54 17.52 7.20 8.56   A:  Suspected pneumonia. P: -->  Continue Zosyn / Vancomycin / Levaquin, stop date 4/30  ENDOCRINE  Lab 12/26/11 0722 12/26/11 0335 12/25/11 2341 12/25/11 2028 12/25/11 1557  GLUCAP 168* 164* 255* 242* 287*   TSH 0.07, Cortisol 30.4  A:  IDDM, hyperglycemia. History of hypothyroidism, but low TSH on admission. P: -->  D/c'd Insulin gtt 4/26 -->  SSI/CBG -->  Recheck TSH pending  MUSCULOSKELETAL A:  Tibia fracture P:  Per Ortho  BEST PRACTICE / DISPOSITION -->  ICU status under PCCM -->  Cardiology, Nephrology and Ortho consulting -->  Full code -->  TF -->  Heparin for DVT Px -->  Protonix IV for GI Px -->  Family not available am 4/27   The patient is critically ill with multiple organ systems failure and requires high complexity decision making for assessment and support, frequent evaluation and titration of therapies, application of advanced monitoring technologies and extensive interpretation of multiple databases. Critical Care Time  devoted to patient care services described in this note is 30 minutes.  Levy Pupa, MD, PhD 12/26/2011, 8:21 AM [facility] [phone] or if no answer [phone]

## 2011-12-26 NOTE — Consult Note (Signed)
SUBJECTIVE:  Intubated sedated.     PHYSICAL EXAM Filed Vitals:   12/26/11 1030 12/26/11 1045 12/26/11 1100 12/26/11 1133  BP:   101/52 108/55  Pulse:   89 92  Temp: 99.7 F (37.6 C) 99.8 F (37.7 C) 99.8 F (37.7 C) 99.7 F (37.6 C)  TempSrc:      Resp:   30 30  Height:      Weight:      SpO2:   100% 98%   General:  Intubated sedated Lungs:  Decreased breath sounds Heart:  RRR, tachycardia, no rub Abdomen:  Decreased bowel sounds Extremities:  Diffuse edema  LABS: Lab Results  Component Value Date   CKTOTAL 972* 12/20/2011   CKMB 48.2* 12/20/2011   TROPONINI >25.00* 12/20/2011   Results for orders placed during the hospital encounter of 12/18/11 (from the past 24 hour(s))  GLUCOSE, CAPILLARY     Status: Abnormal   Collection Time   12/25/11 12:02 PM      Component Value Range   Glucose-Capillary 191 (*) 70 - 99 (mg/dL)  GLUCOSE, CAPILLARY     Status: Abnormal   Collection Time   12/25/11  3:57 PM      Component Value Range   Glucose-Capillary 287 (*) 70 - 99 (mg/dL)  RENAL FUNCTION PANEL     Status: Abnormal   Collection Time   12/25/11  5:25 PM      Component Value Range   Sodium 133 (*) 135 - 145 (mEq/L)   Potassium 3.5  3.5 - 5.1 (mEq/L)   Chloride 97  96 - 112 (mEq/L)   CO2 26  19 - 32 (mEq/L)   Glucose, Bld 296 (*) 70 - 99 (mg/dL)   BUN 29 (*) 6 - 23 (mg/dL)   Creatinine, Ser 7.82 (*) 0.50 - 1.35 (mg/dL)   Calcium 8.3 (*) 8.4 - 10.5 (mg/dL)   Phosphorus 2.4  2.3 - 4.6 (mg/dL)   Albumin 1.9 (*) 3.5 - 5.2 (g/dL)   GFR calc non Af Amer 28 (*) >90 (mL/min)   GFR calc Af Amer 33 (*) >90 (mL/min)  GLUCOSE, CAPILLARY     Status: Abnormal   Collection Time   12/25/11  8:28 PM      Component Value Range   Glucose-Capillary 242 (*) 70 - 99 (mg/dL)  GLUCOSE, CAPILLARY     Status: Abnormal   Collection Time   12/25/11 11:41 PM      Component Value Range   Glucose-Capillary 255 (*) 70 - 99 (mg/dL)   Comment 1 Documented in Chart     Comment 2 Notify RN      GLUCOSE, CAPILLARY     Status: Abnormal   Collection Time   12/26/11  3:35 AM      Component Value Range   Glucose-Capillary 164 (*) 70 - 99 (mg/dL)   Comment 1 Documented in Chart     Comment 2 Notify RN    MAGNESIUM     Status: Abnormal   Collection Time   12/26/11  5:00 AM      Component Value Range   Magnesium 2.6 (*) 1.5 - 2.5 (mg/dL)  RENAL FUNCTION PANEL     Status: Abnormal   Collection Time   12/26/11  5:00 AM      Component Value Range   Sodium 132 (*) 135 - 145 (mEq/L)   Potassium 3.1 (*) 3.5 - 5.1 (mEq/L)   Chloride 98  96 - 112 (mEq/L)   CO2 25  19 -  32 (mEq/L)   Glucose, Bld 168 (*) 70 - 99 (mg/dL)   BUN 30 (*) 6 - 23 (mg/dL)   Creatinine, Ser 1.30 (*) 0.50 - 1.35 (mg/dL)   Calcium 8.2 (*) 8.4 - 10.5 (mg/dL)   Phosphorus 2.4  2.3 - 4.6 (mg/dL)   Albumin 1.9 (*) 3.5 - 5.2 (g/dL)   GFR calc non Af Amer 25 (*) >90 (mL/min)   GFR calc Af Amer 29 (*) >90 (mL/min)  GLUCOSE, CAPILLARY     Status: Abnormal   Collection Time   12/26/11  7:22 AM      Component Value Range   Glucose-Capillary 168 (*) 70 - 99 (mg/dL)    Intake/Output Summary (Last 24 hours) at 12/26/11 1158 Last data filed at 12/26/11 1100  Gross per 24 hour  Intake 3509.5 ml  Output   2487 ml  Net 1022.5 ml    ASSESSMENT AND PLAN:  Cardiomyopathy/Cardiogenic shock:  Back on pressors in the past 24 hours.  On a low does of Milrinone.  Trying to restart ultrafiltration. Unable to remove fluid last 24 hours.  Supportive care per CCM.  Continue Milrinone for now although I will likely wean or stop this in the next 24 to 48 hours.    Fayrene Fearing [facility] 12/26/2011 11:58 AM

## 2011-12-26 NOTE — Progress Notes (Addendum)
2nd filter clotted on pm shift, blood returned to patient. Will not restart CVVH at this time per MD Hyman Hopes order. Will readdress with am MD. HD catheter heparin locked.

## 2011-12-26 NOTE — Progress Notes (Signed)
Subjective: Pressors restarted overnight. I/O's were even last 24 hours, Fi02 is down slightly to 40%. Clotted several filters after heparin AC stopped and is off the machine currently.   Objective Vital signs in last 24 hours: Filed Vitals:   12/26/11 1030 12/26/11 1045 12/26/11 1100 12/26/11 1133  BP:   101/52 108/55  Pulse:   89 92  Temp: 99.7 F (37.6 C) 99.8 F (37.7 C) 99.8 F (37.7 C) 99.7 F (37.6 C)  TempSrc:      Resp:   30 30  Height:      Weight:      SpO2:   100% 98%   Weight change: -2.2 kg (-4 lb 13.6 oz)  Intake/Output Summary (Last 24 hours) at 12/26/11 1214 Last data filed at 12/26/11 1100  Gross per 24 hour  Intake 3448.9 ml  Output   2421 ml  Net 1027.9 ml   Labs: Basic Metabolic Panel:  Lab 12/26/11 6440 12/25/11 1725 12/25/11 0439 12/24/11 1638 12/24/11 0246 12/23/11 1527 12/23/11 1025 12/23/11 0417  NA 132* 133* 135 134* 134* 138 141 --  K 3.1* 3.5 3.4* 3.5 5.2* 4.1 3.1* --  CL 98 97 98 98 95* 96 98 --  CO2 25 26 28 26 29 31  34* --  GLUCOSE 168* 296* 158* 147* 208* 139* 99 --  BUN 30* 29* 25* 26* 33* 38* 40* --  CREATININE 2.89* 2.57* 2.55* 2.87* 3.55* 4.20* 4.47* --  ALB -- -- -- -- -- -- -- --  CALCIUM 8.2* 8.3* 8.5 8.2* 8.0* 7.6* 6.9* --  PHOS 2.4 2.4 4.4 3.6 5.5* 5.5* -- 6.7*   Liver Function Tests:  Lab 12/26/11 0500 12/25/11 1725 12/25/11 0439 12/23/11 0418 12/22/11 0415 12/21/11 0410  AST -- -- -- 52* 73* 104*  ALT -- -- -- 50 59* 59*  ALKPHOS -- -- -- 117 101 83  BILITOT -- -- -- 0.5 0.4 0.5  PROT -- -- -- 4.5* 5.2* 4.7*  ALBUMIN 1.9* 1.9* 2.1* -- -- --   No results found for this basename: LIPASE:3,AMYLASE:3 in the last 168 hours No results found for this basename: AMMONIA:3 in the last 168 hours CBC:  Lab 12/25/11 0439 12/24/11 1712 12/24/11 0246 12/23/11 1016 12/23/11 0418 12/21/11 0410 12/20/11 0430  WBC 21.0* 17.8* 17.2* -- 18.0* -- --  NEUTROABS -- -- -- -- -- 9.0* 7.4  HGB 9.1* 7.2* 7.8* 7.1* -- -- --  HCT 27.4* 21.7*  23.5* 21.0* -- -- --  MCV 88.4 86.1 88.3 -- 90.2 -- --  PLT 379 326 278 -- 300 -- --   PT/INR: @labrcntip (inr:5) Cardiac Enzymes:  Lab 12/20/11 0610 12/19/11 2040 12/19/11 1335  CKTOTAL 972* 1658* 1478*  CKMB 48.2* 261.2* 207.5*  CKMBINDEX -- -- --  TROPONINI >25.00* >25.00* >25.00*   CBG:  Lab 12/26/11 1204 12/26/11 0722 12/26/11 0335 12/25/11 2341 12/25/11 2028  GLUCAP 258* 168* 164* 255* 242*    I  have reviewed scheduled and prn medications.  Physical Exam:  Blood pressure 108/55, pulse 92, temperature 99.7 F (37.6 C), temperature source Core (Comment), resp. rate 30, height 5\' 6"  (1.676 m), weight 90.8 kg (200 lb 2.8 oz), SpO2 98.00%.  Weight down 96 > 90.6kg since starting CRRT, admit 80 kg Gen: on vent sedated Skin: no rash, cyanosis  Neck: no JVD, bruits or LAN  Chest: coarse BS, no fluid in the ETT  Heart: regular, no rub or gallop  Abdomen: soft, nondistended, + BS  Ext: diffuse nonpitting edema 2+  of all extremities Neuro: alert, Ox3, no focal deficit  Heme/Lymph: no bruising or LAN   Assessment/Recommendations  1. AKI due to ATN from combination of IV dye and shock- continue CRRT day #4. Back on pressors. Milrinone also. CCM thinks shock more likely cardiogenic than septic. I/O even yesterday. Made some progress with vol overload initially, but none last 24 hrs. May need to set lower BP thresholds. CRRT did not do well off heparin AC, so will start local citrate AC today.  2. Volume excess/pulm edema- somewhat improved, Fio2 down slightly. Remove volume as BP tolerates, -50 to 75 cc/hr.  3. Acute NSTEMI with ++ CE's and wall motion abnormalities on ECHO, EF 25% 4. TYPE I DM 5. Asp pneumonia 6. VDRF on full support 7. S/p fall with bilat LE tibial fracture   Reginald Moselle  MD [facility] [phone] pgr    [phone] cell 12/26/2011, 12:14 PM

## 2011-12-27 ENCOUNTER — Inpatient Hospital Stay (HOSPITAL_COMMUNITY): Payer: Medicare Other

## 2011-12-27 LAB — POCT I-STAT, CHEM 8
BUN: 35 mg/dL — ABNORMAL HIGH (ref 6–23)
BUN: 27 mg/dL — ABNORMAL HIGH (ref 6–23)
BUN: 27 mg/dL — ABNORMAL HIGH (ref 6–23)
BUN: 33 mg/dL — ABNORMAL HIGH (ref 6–23)
BUN: 26 mg/dL — ABNORMAL HIGH (ref 6–23)
BUN: 25 mg/dL — ABNORMAL HIGH (ref 6–23)
Calcium, Ion: 1.01 mmol/L — ABNORMAL LOW (ref 1.12–1.32)
Calcium, Ion: 0.47 mmol/L — CL (ref 1.12–1.32)
Calcium, Ion: 0.44 mmol/L — CL (ref 1.12–1.32)
Calcium, Ion: 1.01 mmol/L — ABNORMAL LOW (ref 1.12–1.32)
Calcium, Ion: 0.52 mmol/L — CL (ref 1.12–1.32)
Calcium, Ion: 1.02 mmol/L — ABNORMAL LOW (ref 1.12–1.32)
Calcium, Ion: 0.42 mmol/L — CL (ref 1.12–1.32)
Calcium, Ion: 1 mmol/L — ABNORMAL LOW (ref 1.12–1.32)
Calcium, Ion: 0.44 mmol/L — CL (ref 1.12–1.32)
Chloride: 101 mEq/L (ref 96–112)
Chloride: 99 mEq/L (ref 96–112)
Chloride: 99 mEq/L (ref 96–112)
Creatinine, Ser: 3.5 mg/dL — ABNORMAL HIGH (ref 0.50–1.35)
Creatinine, Ser: 3.6 mg/dL — ABNORMAL HIGH (ref 0.50–1.35)
Creatinine, Ser: 2.8 mg/dL — ABNORMAL HIGH (ref 0.50–1.35)
Creatinine, Ser: 3.6 mg/dL — ABNORMAL HIGH (ref 0.50–1.35)
Creatinine, Ser: 2.5 mg/dL — ABNORMAL HIGH (ref 0.50–1.35)
Creatinine, Ser: 3.3 mg/dL — ABNORMAL HIGH (ref 0.50–1.35)
Creatinine, Ser: 3.1 mg/dL — ABNORMAL HIGH (ref 0.50–1.35)
Glucose, Bld: 185 mg/dL — ABNORMAL HIGH (ref 70–99)
Glucose, Bld: 175 mg/dL — ABNORMAL HIGH (ref 70–99)
Glucose, Bld: 204 mg/dL — ABNORMAL HIGH (ref 70–99)
Glucose, Bld: 200 mg/dL — ABNORMAL HIGH (ref 70–99)
Glucose, Bld: 233 mg/dL — ABNORMAL HIGH (ref 70–99)
Glucose, Bld: 281 mg/dL — ABNORMAL HIGH (ref 70–99)
Glucose, Bld: 287 mg/dL — ABNORMAL HIGH (ref 70–99)
Glucose, Bld: 253 mg/dL — ABNORMAL HIGH (ref 70–99)
HCT: 26 % — ABNORMAL LOW (ref 39.0–52.0)
HCT: 24 % — ABNORMAL LOW (ref 39.0–52.0)
HCT: 25 % — ABNORMAL LOW (ref 39.0–52.0)
HCT: 22 % — ABNORMAL LOW (ref 39.0–52.0)
HCT: 25 % — ABNORMAL LOW (ref 39.0–52.0)
HCT: 23 % — ABNORMAL LOW (ref 39.0–52.0)
Hemoglobin: 7.8 g/dL — ABNORMAL LOW (ref 13.0–17.0)
Hemoglobin: 9.2 g/dL — ABNORMAL LOW (ref 13.0–17.0)
Hemoglobin: 8.2 g/dL — ABNORMAL LOW (ref 13.0–17.0)
Hemoglobin: 8.5 g/dL — ABNORMAL LOW (ref 13.0–17.0)
Hemoglobin: 7.5 g/dL — ABNORMAL LOW (ref 13.0–17.0)
Hemoglobin: 8.5 g/dL — ABNORMAL LOW (ref 13.0–17.0)
Hemoglobin: 7.8 g/dL — ABNORMAL LOW (ref 13.0–17.0)
Hemoglobin: 8.8 g/dL — ABNORMAL LOW (ref 13.0–17.0)
Potassium: 3.7 mEq/L (ref 3.5–5.1)
Potassium: 3.4 mEq/L — ABNORMAL LOW (ref 3.5–5.1)
Potassium: 3.4 mEq/L — ABNORMAL LOW (ref 3.5–5.1)
Sodium: 137 mEq/L (ref 135–145)
Sodium: 138 mEq/L (ref 135–145)
Sodium: 135 mEq/L (ref 135–145)
Sodium: 137 mEq/L (ref 135–145)
TCO2: 22 mmol/L (ref 0–100)
TCO2: 24 mmol/L (ref 0–100)
TCO2: 24 mmol/L (ref 0–100)
TCO2: 25 mmol/L (ref 0–100)
TCO2: 26 mmol/L (ref 0–100)
TCO2: 27 mmol/L (ref 0–100)
TCO2: 26 mmol/L (ref 0–100)
TCO2: 28 mmol/L (ref 0–100)

## 2011-12-27 LAB — POCT I-STAT 3, ART BLOOD GAS (G3+)
Acid-Base Excess: 2 mmol/L (ref 0.0–2.0)
Bicarbonate: 27.5 mEq/L — ABNORMAL HIGH (ref 20.0–24.0)
O2 Saturation: 96 %
O2 Saturation: 93 %
Patient temperature: 98.5
TCO2: 32 mmol/L (ref 0–100)
pCO2 arterial: 45.4 mmHg — ABNORMAL HIGH (ref 35.0–45.0)
pH, Arterial: 7.441 (ref 7.350–7.450)
pO2, Arterial: 85 mmHg (ref 80.0–100.0)
pO2, Arterial: 68 mmHg — ABNORMAL LOW (ref 80.0–100.0)

## 2011-12-27 LAB — GLUCOSE, CAPILLARY
Glucose-Capillary: 183 mg/dL — ABNORMAL HIGH (ref 70–99)
Glucose-Capillary: 186 mg/dL — ABNORMAL HIGH (ref 70–99)
Glucose-Capillary: 192 mg/dL — ABNORMAL HIGH (ref 70–99)
Glucose-Capillary: 214 mg/dL — ABNORMAL HIGH (ref 70–99)

## 2011-12-27 LAB — COMPREHENSIVE METABOLIC PANEL
Albumin: 1.9 g/dL — ABNORMAL LOW (ref 3.5–5.2)
Alkaline Phosphatase: 127 U/L — ABNORMAL HIGH (ref 39–117)
BUN: 33 mg/dL — ABNORMAL HIGH (ref 6–23)
CO2: 26 mEq/L (ref 19–32)
Chloride: 94 mEq/L — ABNORMAL LOW (ref 96–112)
GFR calc non Af Amer: 22 mL/min — ABNORMAL LOW (ref >90)
Potassium: 3.2 mEq/L — ABNORMAL LOW (ref 3.5–5.1)
Total Bilirubin: 0.4 mg/dL (ref 0.3–1.2)

## 2011-12-27 LAB — HEPATITIS B CORE ANTIBODY, TOTAL: Hep B Core Total Ab: NEGATIVE

## 2011-12-27 LAB — CBC
MCH: 29.7 pg (ref 26.0–34.0)
MCV: 88.3 fL (ref 78.0–100.0)
Platelets: 379 10*3/uL (ref 150–400)
RDW: 15 % (ref 11.5–15.5)
WBC: 17.3 10*3/uL — ABNORMAL HIGH (ref 4.0–10.5)

## 2011-12-27 LAB — CALCIUM, IONIZED: Calcium, Ion: 0.99 mmol/L — ABNORMAL LOW (ref 1.12–1.32)

## 2011-12-27 MED ORDER — HEPARIN SODIUM (PORCINE) 1000 UNIT/ML DIALYSIS
2000.0000 [IU] | INTRAMUSCULAR | Status: DC | PRN
  Administered 2011-12-28: 2000 [IU] via INTRAVENOUS_CENTRAL
  Filled 2011-12-27: qty 3

## 2011-12-27 MED ORDER — POTASSIUM CHLORIDE CRYS ER 20 MEQ PO TBCR
40.0000 meq | EXTENDED_RELEASE_TABLET | Freq: Once | ORAL | Status: DC
Start: 2011-12-27 — End: 2011-12-27

## 2011-12-27 MED ORDER — HEPARIN SODIUM (PORCINE) 1000 UNIT/ML DIALYSIS
2000.0000 [IU] | INTRAMUSCULAR | Status: AC | PRN
  Administered 2011-12-28: 3000 [IU] via INTRAVENOUS_CENTRAL
  Administered 2011-12-28: 2000 [IU] via INTRAVENOUS_CENTRAL

## 2011-12-27 MED ORDER — JEVITY 1.2 CAL PO LIQD
1000.0000 mL | ORAL | Status: DC
  Administered 2011-12-27: 15:00:00
  Filled 2011-12-27 (×4): qty 1000

## 2011-12-27 MED ORDER — DEXTROSE 5 % IV SOLN
10.0000 meq | Freq: Once | INTRAVENOUS | Status: AC
  Administered 2011-12-27: 10 meq via INTRAVENOUS
  Filled 2011-12-27: qty 2.27

## 2011-12-27 MED ORDER — INSULIN GLARGINE 100 UNIT/ML ~~LOC~~ SOLN: 32.0000 [IU] | SUBCUTANEOUS | Status: DC

## 2011-12-27 MED ORDER — POTASSIUM CHLORIDE 20 MEQ/15ML (10%) PO LIQD
40.0000 meq | Freq: Every day | ORAL | Status: DC
  Administered 2011-12-27 – 2011-12-30 (×4): 40 meq
  Filled 2011-12-27 (×4): qty 30

## 2011-12-27 NOTE — Progress Notes (Signed)
Patient currently on Milrinone at 0.166mcg/kg/min at a rate of 3.6 ml/hr with an ordered rate of 0.065 mcg/kg/min at 1.63ml /hr. Pharmacist, CCM MD and Cardiology PA referred question to cardiology MD Gwendalyn Ege. Dr Mayford Knife requested to have milrinone stay at current rate of 0.125 mcg/kg/min. Will modify order with this current request.   Sherle Poe RN

## 2011-12-27 NOTE — Progress Notes (Signed)
Subjective: Off levo gtt,  Still neo gtt. Clotted several filters overnight in spite of citrate AC.  I/O net neg 250 cc.   Objective Vital signs in last 24 hours: Filed Vitals:   12/27/11 0900 12/27/11 1000 12/27/11 1100 12/27/11 1206  BP: 121/53 112/61 106/54 102/47  Pulse: 112 122 115 105  Temp: 100.3 F (37.9 C) 100.3 F (37.9 C) 100.6 F (38.1 C) 100.6 F (38.1 C)  TempSrc:      Resp: 17 18 17 18   Height: 5\' 6"  (1.676 m)     Weight:      SpO2: 96% 100% 98% 96%   Weight change: 1.8 kg (3 lb 15.5 oz)  Intake/Output Summary (Last 24 hours) at 12/27/11 1219 Last data filed at 12/27/11 1101  Gross per 24 hour  Intake 4306.3 ml  Output   4555 ml  Net -248.7 ml   Labs: Basic Metabolic Panel:  Lab 12/27/11 1610 12/27/11 0425 12/27/11 0334 12/26/11 2313 12/26/11 2306 12/26/11 2134 12/26/11 2128 12/26/11 1600 12/26/11 0500 12/25/11 1725 12/25/11 0439 12/24/11 1638 12/24/11 0246  NA 132* 133* 136 137 135 137 135 -- -- -- -- -- --  K 3.2* 3.4* 3.4* 3.5 3.5 3.7 3.6 -- -- -- -- -- --  CL 94* 98 97 99 99 99 101 -- -- -- -- -- --  CO2 26 -- -- -- -- -- -- 22 25 26 28 26 29   GLUCOSE 276* 287* 281* 233* 200* 204* 175* -- -- -- -- -- --  BUN 33* 34* 27* 27* 33* 27* 33* -- -- -- -- -- --  CREATININE 3.22* 3.30* 2.50* 2.80* 3.60* 2.80* 3.60* -- -- -- -- -- --  ALB -- -- -- -- -- -- -- -- -- -- -- -- --  CALCIUM 8.2* -- -- -- -- -- -- 8.0* 8.2* 8.3* 8.5 8.2* 8.0*  PHOS 1.8* -- -- -- -- -- -- 1.8* 2.4 2.4 4.4 3.6 5.5*   Liver Function Tests:  Lab 12/27/11 0430 12/26/11 1600 12/26/11 0500 12/23/11 0418 12/22/11 0415  AST 25 -- -- 52* 73*  ALT 22 -- -- 50 59*  ALKPHOS 127* -- -- 117 101  BILITOT 0.4 -- -- 0.5 0.4  PROT 5.6* -- -- 4.5* 5.2*  ALBUMIN 1.9* 1.8* 1.9* -- --   No results found for this basename: LIPASE:3,AMYLASE:3 in the last 168 hours No results found for this basename: AMMONIA:3 in the last 168 hours CBC:  Lab 12/27/11 0430 12/27/11 0425 12/27/11 0334 12/26/11 2313  12/25/11 0439 12/24/11 1712 12/24/11 0246 12/21/11 0410  WBC 17.3* -- -- -- 21.0* 17.8* 17.2* --  NEUTROABS -- -- -- -- -- -- -- 9.0*  HGB 8.1* 8.2* 8.5* 9.2* -- -- -- --  HCT 24.1* 24.0* 25.0* 27.0* -- -- -- --  MCV 88.3 -- -- -- 88.4 86.1 88.3 --  PLT 379 -- -- -- 379 326 278 --   PT/INR: @labrcntip (inr:5) Cardiac Enzymes: No results found for this basename: CKTOTAL:5,CKMB:5,CKMBINDEX:5,TROPONINI:5 in the last 168 hours CBG:  Lab 12/27/11 0712 12/27/11 0350 12/26/11 2355 12/26/11 1918 12/26/11 1619  GLUCAP 214* 270* 183* 175* 218*    I  have reviewed scheduled and prn medications.  Physical Exam:  Blood pressure 102/47, pulse 105, temperature 100.6 F (38.1 C), temperature source Core (Comment), resp. rate 18, height 5\' 6"  (1.676 m), weight 92.6 kg (204 lb 2.3 oz), SpO2 96.00%.  Weight down 96 > 90.6kg since starting CRRT, admit 80 kg Gen: on vent sedated  Skin: no rash, cyanosis  Neck: no JVD, bruits or LAN  Chest: coarse BS, no fluid in the ETT  Heart: regular, no rub or gallop  Abdomen: soft, nondistended, + BS  Ext: diffuse nonpitting edema 2+ of all extremities Neuro: alert, Ox3, no focal deficit  Heme/Lymph: no bruising or LAN   Assessment/Recommendations  1. AKI due to ATN from combination of IV dye and shock- clotting filters in spite of citrate AC. I am going to try regular HD today and hold CRRT for now. Decrease volume as tolerated. 2. Shock- cardiogenic +/- septic. On neo gtt, levo gtt is off. 3. Hypophosphatemia- from CRRT. Rec'd IV KPhos x 1 today.   4. Volume excess- still has lots of volume.  5. Acute NSTEMI with ++ CE's and wall motion abnormalities on ECHO, EF 25% 6. TYPE I DM 7. Asp pneumonia 8. VDRF on full support 9. S/p fall with bilat LE tibial fracture   Reginald Moselle  MD [facility] [phone] pgr    [phone] cell 12/27/2011, 12:19 PM

## 2011-12-27 NOTE — Progress Notes (Signed)
Name: Reginald Gutierrez MRN: [id number] DOB: August 02, 1966    LOS: 9  PCCM PROGRESS NOTE Active Problems:  HYPOTHYROIDISM  DIABETES MELLITUS, TYPE I  Tibial fracture  NSTEMI (non-ST elevated myocardial infarction)  Acute blood loss anemia  Acute respiratory failure  Aspiration pneumonia  Metabolic encephalopathy  Protein calorie malnutrition  Acute renal failure  Hyperkalemia  Ileus  ARDS (adult respiratory distress syndrome)  Encephalopathy acute  History of Present Illness: 46 y/o with history of DM and cocaine abuse admitted with bilateral tibia fracture s/p syncopal episode on 4/19.  Postoperatively he developed acute hypoxia and refractory shock, transient ST depression and troponin leak.  Course is complicated by ARDS requiring HFOV and acute renal failure requiring CVVHD.  Lines / Drains: 4/20 OETT >>> 4/20  OGT >>> 4/20 R IJ CVL >>> 4/20 R fem A-line >>> 4/23 L IJ DH >>>  Cultures: 4/20  Blood >>> NTD 4/20  Sputum >>> NTD 4/23  Blood >>> NTD 4/23  Urine >>> NTD  Abx: Zosyn 4/20 (empirical, G-, anaerobes) >>> Vanc 4/20 (empirical, MRSA) >>> Levaquin 4/24 (empirical, atypical) >>>  Tests / Events: 4/19  Admission, repair of bilat tibia Fx 4/20  Refractory shock, hypoxemic resp failure, NSTEMI.  Intubated 4/20  CT head >>> NAD 4/20  TTE >>> EF 25 to 30%, akinesis of mid/distal anteroseptal and apical area 4/21  Chest CT angio >>> No central pulmonary embolism. Slight progression of right greater than left airspace disease. 4/23  Renal US >>> no hydronephrosis, no urine obstruction 4/23  HFOV due to refractory hypoxemia.  Vasopressors.  CVVHD. 4/24  Off HFOV  Subjective/interval: 1. Off Levophed, still on Neo 2. Milrinone, minimal UOP 3. CVVHD restarted without difficulty  Vital Signs: Temp:  [98.5 F (36.9 C)-100.9 F (38.3 C)] 98.7 F (37.1 C) (04/28 0500) Pulse Rate:  [89-130] 107  (04/28 0500) Resp:  [12-35] 16  (04/28 0500) BP: (97-149)/(41-73) 106/55  mmHg (04/28 0500) SpO2:  [85 %-100 %] 100 % (04/28 0500) Arterial Line BP: (71-144)/(40-72) 111/54 mmHg (04/28 0500) FiO2 (%):  [40 %-50 %] 50 % (04/28 0343) Weight:  [204 lb 2.3 oz (92.6 kg)] 204 lb 2.3 oz (92.6 kg) (04/28 0432) I/O last 3 completed shifts: In: 5410.1 [I.V.:4790.1; NG/GT:320; IV Piggyback:300] Out: 4520 [Urine:45; Emesis/NG output:900; Other:3550; Stool:25]  Physical Examination: General:  Intubated, mechanically ventilated, no acute distress Neuro:  Sedated, synchronous, nonfocal HEENT:  PERRL, pink conjunctivae, moist membranes Neck:  Supple, no JVD   Cardiovascular:  RRR, no M/G/R Lungs: diffused rhonchi bilaterally Abdomen:  distended, decreased BS.  Musculoskeletal: B LE injuries.  Skin:  No rash  Ventilator settings: Vent Mode:  [-] PCV FiO2 (%):  [40 %-50 %] 50 % Set Rate:  [30 bmp] 30 bmp PEEP:  [5 cmH20-10 cmH20] 10 cmH20 Pressure Support:  [10 cmH20] 10 cmH20 Plateau Pressure:  [21 cmH20-30 cmH20] 30 cmH20  Labs and Imaging:  Reviewed.  Please refer to the Assessment and Plan section for relevant results.  ASSESSMENT AND PLAN  NEUROLOGIC A:  Acute encephalopathy (metabolic, medications, anoxia, hypoglycemia).  History of Cocaine abuse (clean 5-10 years). P: -->  Versed / Fentanyl gtt to goal RASS 0 to -1 -->  Tolerated d/c Nimbex 4/26  -->  daily WUA/SBT  PULMONARY  Lab 12/27/11 0430 12/25/11 0513 12/24/11 0249 12/23/11 2014 12/23/11 1847  PHART 7.391 7.254* 7.267* 7.319* 7.238*  PCO2ART 45.3* 66.0* 67.4* 63.0* 78.6*  PO2ART 85.0 64.0* 64.0* 106.0* 82.0  HCO3 27.5* 29.8* 30.8* 32.8* 33.5*  O2SAT 96.0 90.0 88.0 98.0 93.0   4/27  CXR >>> No change B effusions and LL infiltrates/atx, L>R  A:   Acute hypoxemic / hypercarbic respiratory failure.  Pulmoanry edema more likely as baseline decreased EF and acute MI.  Less likely ARDS. P:  -->  Pressure control ventilation -->  Goal pH > 7.25, SpO2 > 88, FiO2 < 0.5 -->  Daily WUA/SBT -->   Consider thora to enhance volume removal  CARDIOVASCULAR  Lab 12/23/11 0418 12/21/11 0410 12/20/11 0945  TROPONINI -- -- --  LATICACIDVEN -- -- 2.9*  PROBNP 16645.0* 3459.0* --   4/20  ECHO EF 25%, some regional wall motion abnormalities.  A:  NSTEMI, likely intraoperative (on the background of poorly controlled DM).  Acute cardiomyopathy / congestive heart failure.  Cardiogenic shock, less likely septic.   -->  Continue ASA,  -->  Heparin completed -->  Hold Metoprolol as in shock -->  Holding Plavix as acute blood loss anemia -->  Continue Milrinone to optimize cardiac output -->  Neo-Synephrine (1st choice) then Levophed (2d choice) to keep MAP > 60 -->  Should consider cardiac cath / possible IABP as we move forward -->  Follow BNP  RENAL  Lab 12/27/11 0430 12/27/11 0425 12/27/11 0334 12/26/11 2313 12/26/11 2306 12/26/11 1600 12/26/11 0500 12/25/11 1725 12/25/11 0439 12/24/11 0246 12/23/11 0417  NA 132* 133* 136 137 135 -- -- -- -- -- --  K 3.2* 3.4* -- -- -- -- -- -- -- -- --  CL 94* 98 97 99 99 -- -- -- -- -- --  CO2 26 -- -- -- -- 22 25 26 28  -- --  BUN 33* 34* 27* 27* 33* -- -- -- -- -- --  CREATININE 3.22* 3.30* 2.50* 2.80* 3.60* -- -- -- -- -- --  CALCIUM 8.2* -- -- -- -- 8.0* 8.2* 8.3* 8.5 -- --  MG 2.4 -- -- -- -- -- 2.6* -- 2.6* 2.3 2.2  PHOS 1.8* -- -- -- -- 1.8* 2.4 2.4 4.4 -- --   I/O last 3 completed shifts: In: 5410.1 [I.V.:4790.1; NG/GT:320; IV Piggyback:300] Out: 4520 [Urine:45; Emesis/NG output:900; Other:3550; Stool:25]  A:  Acute renal failure / AKI.  Hypokalemia.  Mild hyponatremia, hypophosphatemia. Marland Kitchen P: -->  Continue CVVHD, renal following -->  CVP checks to qshift, goal < 4 if BP tolerates -->  Replace K and phos -->  BMP in AM  GASTROINTESTINAL  Lab 12/27/11 0430 12/26/11 1600 12/26/11 0500 12/25/11 1725 12/25/11 0439 12/23/11 0418 12/22/11 0415 12/21/11 0410  AST 25 -- -- -- -- 52* 73* 104*  ALT 22 -- -- -- -- 50 59* 59*  ALKPHOS 127* --  -- -- -- 117 101 83  BILITOT 0.4 -- -- -- -- 0.5 0.4 0.5  PROT 5.6* -- -- -- -- 4.5* 5.2* 4.7*  ALBUMIN 1.9* 1.8* 1.9* 1.9* 2.1* -- -- --   A:  Shocked liver.  Ileus.  Malnutrition.  P: --> TF on hold for residuals, consider to restart today -->  GI Px  HEMATOLOGIC  Lab 12/27/11 0430 12/27/11 0425 12/27/11 0334 12/26/11 2313 12/26/11 2306 12/25/11 0439 12/24/11 1712 12/24/11 0246 12/23/11 0418  HGB 8.1* 8.2* 8.5* 9.2* 7.1* -- -- -- --  HCT 24.1* 24.0* 25.0* 27.0* 21.0* -- -- -- --  PLT 379 -- -- -- -- 379 326 278 300  INR -- -- -- -- -- -- -- -- --  APTT -- -- -- -- -- 85* -- 69* 45*  A: Acute anemia.  No overt hemorrhage.  P: --> CBC an AM --> Goal Hb > 8, given AMI  INFECTIOUS  Lab 12/27/11 0430 12/25/11 1030 12/25/11 0439 12/24/11 1712 12/24/11 0246 12/23/11 0418 12/22/11 0415 12/20/11 0945  WBC 17.3* -- 21.0* 17.8* 17.2* 18.0* -- --  PROCALCITON -- 7.08 -- -- -- 9.54 17.52 7.20   A:  Suspected pneumonia. P: -->  Continue Zosyn / Vancomycin / Levaquin, stop date 4/30  ENDOCRINE  Lab 12/27/11 0350 12/26/11 2355 12/26/11 1918 12/26/11 1619 12/26/11 1204  GLUCAP 270* 183* 175* 218* 258*   TSH 3.746, Cortisol 30.4  A:  IDDM, hyperglycemia. History of hypothyroidism, but low TSH on admission. P: -->  D/c'd Insulin gtt 4/26 -->  SSI/CBG --> increase lantus to 32u and follow, may need to go back on insulin gtt  MUSCULOSKELETAL A:  Tibia fracture P:  Per Ortho  BEST PRACTICE / DISPOSITION -->  ICU status under PCCM -->  Cardiology, Nephrology and Ortho consulting -->  Full code -->  TF -->  Heparin for DVT Px -->  Protonix IV for GI Px -->  Family not available am 4/28  Anselmo Pickler, MD (PGY 1)    Levy Pupa, MD, PhD 12/27/2011, 9:28 AM [facility] [phone] or if no answer [phone]

## 2011-12-27 NOTE — Progress Notes (Signed)
I spoke with pt's mother on the phone and answered questions to the best of my ability. We discussed code status and she said that she does not want CPR/defibrillation etc in event of cardiac arrest, but to continue other measures for now. Discussed with Dr. Marchelle Gearing who said he will write the order.   Vinson Moselle  MD [facility] [phone] pgr    [phone] cell 12/27/2011, 5:47 PM

## 2011-12-27 NOTE — Progress Notes (Addendum)
eLink Physician-Brief Progress Note Patient Name: Reginald Gutierrez DOB: 05-14-1966 MRN: [id number]  Date of Service  12/27/2011   HPI/Events of Note   RN called elink saying mom asking about code status due to switch in HD. I Called sister [name] and she says they are torn about code status and directionality of care. I called mom and she did not want to talk to me beccause I was not bedside MD. She wants to talk to renal. So, I called DR Arlean Hopping nd he will talk to mom. Spoke to sister after that and suggested detailed family meeting  eICU Interventions  Recommend detailed family meeting Dr Arlean Hopping to call mom   Update 5:05 PM Dr Arlean Hopping called back: says mom was crying on the phone and preferrred patient by DNAR but to continue full medical care.   REcommend detailed family meeting by bedside PCCM MD 12/28/11   Intervention Category Intermediate Interventions: Communication with other healthcare providers and/or family  Diany Formosa 12/27/2011, 4:53 PM

## 2011-12-27 NOTE — Progress Notes (Signed)
Temporary HD catheter was difficult to use with position, coughing and moving affecting the blood flow rates, however, we did get a full 4 hr dialysis and 4000 cc off without any BP difficulty, which is a very good result since volume is the pressing issue right now. Will do HD again Monday morning and challenge volume again- weight is down to 88kg now (peak 96, admit 80kg). If the catheter doesn't function, replacement will be necessary.   Vinson Moselle  MD 12/27/2011, 8:37 PM

## 2011-12-27 NOTE — Progress Notes (Signed)
SUBJECTIVE:  Intubated sedated.   PHYSICAL EXAM Filed Vitals:   12/27/11 0747 12/27/11 0800 12/27/11 0900 12/27/11 1000  BP: 109/55 120/54 121/53 112/61  Pulse: 110 113 112 122  Temp: 100 F (37.8 C) 100 F (37.8 C) 100.3 F (37.9 C) 100.3 F (37.9 C)  TempSrc:      Resp: 14 19 17 18   Height:   5\' 6"  (1.676 m)   Weight:      SpO2: 100% 100% 96% 100%   General:  Intubated sedated Lungs:  Decreased breath sounds Heart:  Tachy Abdomen:  Distended Extremities:  Diffuse edema.  LABS:  Results for orders placed during the hospital encounter of 12/18/11 (from the past 24 hour(s))  GLUCOSE, CAPILLARY     Status: Abnormal   Collection Time   12/26/11 12:04 PM      Component Value Range   Glucose-Capillary 258 (*) 70 - 99 (mg/dL)  RENAL FUNCTION PANEL     Status: Abnormal   Collection Time   12/26/11  4:00 PM      Component Value Range   Sodium 130 (*) 135 - 145 (mEq/L)   Potassium 3.2 (*) 3.5 - 5.1 (mEq/L)   Chloride 97  96 - 112 (mEq/L)   CO2 22  19 - 32 (mEq/L)   Glucose, Bld 253 (*) 70 - 99 (mg/dL)   BUN 38 (*) 6 - 23 (mg/dL)   Creatinine, Ser 0.45 (*) 0.50 - 1.35 (mg/dL)   Calcium 8.0 (*) 8.4 - 10.5 (mg/dL)   Phosphorus 1.8 (*) 2.3 - 4.6 (mg/dL)   Albumin 1.8 (*) 3.5 - 5.2 (g/dL)   GFR calc non Af Amer 19 (*) >90 (mL/min)   GFR calc Af Amer 22 (*) >90 (mL/min)  GLUCOSE, CAPILLARY     Status: Abnormal   Collection Time   12/26/11  4:19 PM      Component Value Range   Glucose-Capillary 218 (*) 70 - 99 (mg/dL)  POCT ACTIVATED CLOTTING TIME     Status: Normal   Collection Time   12/26/11  4:51 PM      Component Value Range   Activated Clotting Time 155    POCT I-STAT, CHEM 8     Status: Abnormal   Collection Time   12/26/11  5:23 PM      Component Value Range   Sodium 134 (*) 135 - 145 (mEq/L)   Potassium 3.7  3.5 - 5.1 (mEq/L)   Chloride 101  96 - 112 (mEq/L)   BUN 36 (*) 6 - 23 (mg/dL)   Creatinine, Ser 4.09 (*) 0.50 - 1.35 (mg/dL)   Glucose, Bld 811 (*) 70  - 99 (mg/dL)   Calcium, Ion 9.14 (*) 1.12 - 1.32 (mmol/L)   TCO2 22  0 - 100 (mmol/L)   Hemoglobin 7.8 (*) 13.0 - 17.0 (g/dL)   HCT 78.2 (*) 95.6 - 52.0 (%)  POCT I-STAT, CHEM 8     Status: Abnormal   Collection Time   12/26/11  6:43 PM      Component Value Range   Sodium 137  135 - 145 (mEq/L)   Potassium 3.7  3.5 - 5.1 (mEq/L)   Chloride 99  96 - 112 (mEq/L)   BUN 27 (*) 6 - 23 (mg/dL)   Creatinine, Ser 2.13 (*) 0.50 - 1.35 (mg/dL)   Glucose, Bld 086 (*) 70 - 99 (mg/dL)   Calcium, Ion 5.78 (*) 1.12 - 1.32 (mmol/L)   TCO2 23  0 - 100 (mmol/L)  Hemoglobin 8.8 (*) 13.0 - 17.0 (g/dL)   HCT 16.1 (*) 09.6 - 52.0 (%)  POCT I-STAT, CHEM 8     Status: Abnormal   Collection Time   12/26/11  6:54 PM      Component Value Range   Sodium 134 (*) 135 - 145 (mEq/L)   Potassium 3.7  3.5 - 5.1 (mEq/L)   Chloride 101  96 - 112 (mEq/L)   BUN 35 (*) 6 - 23 (mg/dL)   Creatinine, Ser 0.45 (*) 0.50 - 1.35 (mg/dL)   Glucose, Bld 409 (*) 70 - 99 (mg/dL)   Calcium, Ion 8.11 (*) 1.12 - 1.32 (mmol/L)   TCO2 22  0 - 100 (mmol/L)   Hemoglobin 7.8 (*) 13.0 - 17.0 (g/dL)   HCT 91.4 (*) 78.2 - 52.0 (%)  GLUCOSE, CAPILLARY     Status: Abnormal   Collection Time   12/26/11  7:18 PM      Component Value Range   Glucose-Capillary 175 (*) 70 - 99 (mg/dL)   Comment 1 Notify RN    POCT I-STAT, CHEM 8     Status: Abnormal   Collection Time   12/26/11  9:28 PM      Component Value Range   Sodium 135  135 - 145 (mEq/L)   Potassium 3.6  3.5 - 5.1 (mEq/L)   Chloride 101  96 - 112 (mEq/L)   BUN 33 (*) 6 - 23 (mg/dL)   Creatinine, Ser 9.56 (*) 0.50 - 1.35 (mg/dL)   Glucose, Bld 213 (*) 70 - 99 (mg/dL)   Calcium, Ion 0.86 (*) 1.12 - 1.32 (mmol/L)   TCO2 24  0 - 100 (mmol/L)   Hemoglobin 8.8 (*) 13.0 - 17.0 (g/dL)   HCT 57.8 (*) 46.9 - 52.0 (%)  POCT I-STAT, CHEM 8     Status: Abnormal   Collection Time   12/26/11  9:34 PM      Component Value Range   Sodium 137  135 - 145 (mEq/L)   Potassium 3.7  3.5 - 5.1  (mEq/L)   Chloride 99  96 - 112 (mEq/L)   BUN 27 (*) 6 - 23 (mg/dL)   Creatinine, Ser 6.29 (*) 0.50 - 1.35 (mg/dL)   Glucose, Bld 528 (*) 70 - 99 (mg/dL)   Calcium, Ion 4.13 (*) 1.12 - 1.32 (mmol/L)   TCO2 24  0 - 100 (mmol/L)   Hemoglobin 8.8 (*) 13.0 - 17.0 (g/dL)   HCT 24.4 (*) 01.0 - 52.0 (%)   Comment VALUES EXPECTED, NO REPEAT    POCT I-STAT, CHEM 8     Status: Abnormal   Collection Time   12/26/11 11:06 PM      Component Value Range   Sodium 135  135 - 145 (mEq/L)   Potassium 3.5  3.5 - 5.1 (mEq/L)   Chloride 99  96 - 112 (mEq/L)   BUN 33 (*) 6 - 23 (mg/dL)   Creatinine, Ser 2.72 (*) 0.50 - 1.35 (mg/dL)   Glucose, Bld 536 (*) 70 - 99 (mg/dL)   Calcium, Ion 6.44 (*) 1.12 - 1.32 (mmol/L)   TCO2 25  0 - 100 (mmol/L)   Hemoglobin 7.1 (*) 13.0 - 17.0 (g/dL)   HCT 03.4 (*) 74.2 - 52.0 (%)  POCT I-STAT, CHEM 8     Status: Abnormal   Collection Time   12/26/11 11:13 PM      Component Value Range   Sodium 137  135 - 145 (mEq/L)   Potassium 3.5  3.5 -  5.1 (mEq/L)   Chloride 99  96 - 112 (mEq/L)   BUN 27 (*) 6 - 23 (mg/dL)   Creatinine, Ser 1.61 (*) 0.50 - 1.35 (mg/dL)   Glucose, Bld 096 (*) 70 - 99 (mg/dL)   Calcium, Ion 0.45 (*) 1.12 - 1.32 (mmol/L)   TCO2 24  0 - 100 (mmol/L)   Hemoglobin 9.2 (*) 13.0 - 17.0 (g/dL)   HCT 40.9 (*) 81.1 - 52.0 (%)  GLUCOSE, CAPILLARY     Status: Abnormal   Collection Time   12/26/11 11:55 PM      Component Value Range   Glucose-Capillary 183 (*) 70 - 99 (mg/dL)   Comment 1 Notify RN    POCT I-STAT, CHEM 8     Status: Abnormal   Collection Time   12/27/11  3:34 AM      Component Value Range   Sodium 136  135 - 145 (mEq/L)   Potassium 3.4 (*) 3.5 - 5.1 (mEq/L)   Chloride 97  96 - 112 (mEq/L)   BUN 27 (*) 6 - 23 (mg/dL)   Creatinine, Ser 9.14 (*) 0.50 - 1.35 (mg/dL)   Glucose, Bld 782 (*) 70 - 99 (mg/dL)   Calcium, Ion 9.56 (*) 1.12 - 1.32 (mmol/L)   TCO2 24  0 - 100 (mmol/L)   Hemoglobin 8.5 (*) 13.0 - 17.0 (g/dL)   HCT 21.3 (*) 08.6 -  52.0 (%)   Comment VALUES EXPECTED, NO REPEAT    GLUCOSE, CAPILLARY     Status: Abnormal   Collection Time   12/27/11  3:50 AM      Component Value Range   Glucose-Capillary 270 (*) 70 - 99 (mg/dL)   Comment 1 Notify RN    POCT I-STAT, CHEM 8     Status: Abnormal   Collection Time   12/27/11  4:25 AM      Component Value Range   Sodium 133 (*) 135 - 145 (mEq/L)   Potassium 3.4 (*) 3.5 - 5.1 (mEq/L)   Chloride 98  96 - 112 (mEq/L)   BUN 34 (*) 6 - 23 (mg/dL)   Creatinine, Ser 5.78 (*) 0.50 - 1.35 (mg/dL)   Glucose, Bld 469 (*) 70 - 99 (mg/dL)   Calcium, Ion 6.29 (*) 1.12 - 1.32 (mmol/L)   TCO2 26  0 - 100 (mmol/L)   Hemoglobin 8.2 (*) 13.0 - 17.0 (g/dL)   HCT 52.8 (*) 41.3 - 52.0 (%)  MAGNESIUM     Status: Normal   Collection Time   12/27/11  4:30 AM      Component Value Range   Magnesium 2.4  1.5 - 2.5 (mg/dL)  COMPREHENSIVE METABOLIC PANEL     Status: Abnormal   Collection Time   12/27/11  4:30 AM      Component Value Range   Sodium 132 (*) 135 - 145 (mEq/L)   Potassium 3.2 (*) 3.5 - 5.1 (mEq/L)   Chloride 94 (*) 96 - 112 (mEq/L)   CO2 26  19 - 32 (mEq/L)   Glucose, Bld 276 (*) 70 - 99 (mg/dL)   BUN 33 (*) 6 - 23 (mg/dL)   Creatinine, Ser 2.44 (*) 0.50 - 1.35 (mg/dL)   Calcium 8.2 (*) 8.4 - 10.5 (mg/dL)   Total Protein 5.6 (*) 6.0 - 8.3 (g/dL)   Albumin 1.9 (*) 3.5 - 5.2 (g/dL)   AST 25  0 - 37 (U/L)   ALT 22  0 - 53 (U/L)   Alkaline Phosphatase 127 (*) 39 -  117 (U/L)   Total Bilirubin 0.4  0.3 - 1.2 (mg/dL)   GFR calc non Af Amer 22 (*) >90 (mL/min)   GFR calc Af Amer 25 (*) >90 (mL/min)  CBC     Status: Abnormal   Collection Time   12/27/11  4:30 AM      Component Value Range   WBC 17.3 (*) 4.0 - 10.5 (K/uL)   RBC 2.73 (*) 4.22 - 5.81 (MIL/uL)   Hemoglobin 8.1 (*) 13.0 - 17.0 (g/dL)   HCT 16.1 (*) 09.6 - 52.0 (%)   MCV 88.3  78.0 - 100.0 (fL)   MCH 29.7  26.0 - 34.0 (pg)   MCHC 33.6  30.0 - 36.0 (g/dL)   RDW 04.5  40.9 - 81.1 (%)   Platelets 379  150 - 400  (K/uL)  PHOSPHORUS     Status: Abnormal   Collection Time   12/27/11  4:30 AM      Component Value Range   Phosphorus 1.8 (*) 2.3 - 4.6 (mg/dL)  POCT I-STAT 3, BLOOD GAS (G3+)     Status: Abnormal   Collection Time   12/27/11  4:30 AM      Component Value Range   pH, Arterial 7.391  7.350 - 7.450    pCO2 arterial 45.3 (*) 35.0 - 45.0 (mmHg)   pO2, Arterial 85.0  80.0 - 100.0 (mmHg)   Bicarbonate 27.5 (*) 20.0 - 24.0 (mEq/L)   TCO2 29  0 - 100 (mmol/L)   O2 Saturation 96.0     Acid-Base Excess 2.0  0.0 - 2.0 (mmol/L)   Patient temperature 98.5 F     Collection site ARTERIAL LINE     Sample type ARTERIAL    GLUCOSE, CAPILLARY     Status: Abnormal   Collection Time   12/27/11  7:12 AM      Component Value Range   Glucose-Capillary 214 (*) 70 - 99 (mg/dL)    Intake/Output Summary (Last 24 hours) at 12/27/11 1007 Last data filed at 12/27/11 0901  Gross per 24 hour  Intake 4152.5 ml  Output   4208 ml  Net  -55.5 ml    ASSESSMENT AND PLAN:  Cardiomypathy/Cardiogenic shock:  I discussed possible right heart cath to help clarify the hemodynamics and guide therapy.  Can also consider relook echo to make sure there is no pericardial effusion developing given his continued poor hemodynamics.  Today, continuing attempts at volume removal.  I will discuss with HF team in the am.    Rollene Rotunda 12/27/2011 10:07 AM

## 2011-12-28 ENCOUNTER — Inpatient Hospital Stay (HOSPITAL_COMMUNITY): Payer: Medicare Other

## 2011-12-28 ENCOUNTER — Encounter (HOSPITAL_COMMUNITY): Payer: Self-pay | Admitting: Anesthesiology

## 2011-12-28 DIAGNOSIS — N179 Acute kidney failure, unspecified: Secondary | ICD-10-CM

## 2011-12-28 DIAGNOSIS — J9589 Other postprocedural complications and disorders of respiratory system, not elsewhere classified: Secondary | ICD-10-CM

## 2011-12-28 DIAGNOSIS — I059 Rheumatic mitral valve disease, unspecified: Secondary | ICD-10-CM

## 2011-12-28 LAB — RENAL FUNCTION PANEL
Albumin: 1.9 g/dL — ABNORMAL LOW (ref 3.5–5.2)
CO2: 27 mEq/L (ref 19–32)
Calcium: 8.6 mg/dL (ref 8.4–10.5)
Creatinine, Ser: 3.54 mg/dL — ABNORMAL HIGH (ref 0.50–1.35)
GFR calc Af Amer: 22 mL/min — ABNORMAL LOW (ref >90)
GFR calc non Af Amer: 19 mL/min — ABNORMAL LOW (ref >90)
Phosphorus: 2.5 mg/dL (ref 2.3–4.6)
Sodium: 135 mEq/L (ref 135–145)

## 2011-12-28 LAB — CBC
HCT: 22.4 % — ABNORMAL LOW (ref 39.0–52.0)
MCV: 88.5 fL (ref 78.0–100.0)
Platelets: 383 10*3/uL (ref 150–400)
RBC: 2.53 MIL/uL — ABNORMAL LOW (ref 4.22–5.81)
RDW: 15.5 % (ref 11.5–15.5)
WBC: 17.6 10*3/uL — ABNORMAL HIGH (ref 4.0–10.5)

## 2011-12-28 LAB — HIV ANTIBODY (ROUTINE TESTING W REFLEX): HIV: NONREACTIVE

## 2011-12-28 LAB — URINALYSIS, ROUTINE W REFLEX MICROSCOPIC
Glucose, UA: 100 mg/dL — AB
Ketones, ur: NEGATIVE mg/dL
Nitrite: NEGATIVE
Protein, ur: >300 mg/dL — AB
pH: 6 (ref 5.0–8.0)

## 2011-12-28 LAB — GLUCOSE, CAPILLARY
Glucose-Capillary: 148 mg/dL — ABNORMAL HIGH (ref 70–99)
Glucose-Capillary: 295 mg/dL — ABNORMAL HIGH (ref 70–99)

## 2011-12-28 LAB — URINE MICROSCOPIC-ADD ON

## 2011-12-28 MED ORDER — INSULIN GLARGINE 100 UNIT/ML ~~LOC~~ SOLN
32.0000 [IU] | Freq: Every day | SUBCUTANEOUS | Status: DC
  Administered 2011-12-28 – 2012-01-05 (×9): 32 [IU] via SUBCUTANEOUS

## 2011-12-28 MED ORDER — VANCOMYCIN HCL 1000 MG IV SOLR
2000.0000 mg | Freq: Once | INTRAVENOUS | Status: AC
  Administered 2011-12-28: 2000 mg via INTRAVENOUS
  Filled 2011-12-28: qty 2000

## 2011-12-28 MED ORDER — PANTOPRAZOLE SODIUM 40 MG PO PACK
40.0000 mg | PACK | Freq: Every day | ORAL | Status: DC
  Administered 2011-12-29 – 2012-01-07 (×9): 40 mg
  Filled 2011-12-28 (×11): qty 20

## 2011-12-28 MED ORDER — DEXTROSE 5 % IV SOLN
1.0000 g | INTRAVENOUS | Status: DC
  Administered 2011-12-28 – 2012-01-05 (×9): 1 g via INTRAVENOUS
  Filled 2011-12-28 (×9): qty 1

## 2011-12-28 MED ORDER — PRO-STAT SUGAR FREE PO LIQD
30.0000 mL | Freq: Four times a day (QID) | ORAL | Status: DC
  Administered 2011-12-28 – 2011-12-31 (×11): 30 mL
  Filled 2011-12-28 (×15): qty 30

## 2011-12-28 MED ORDER — INSULIN GLARGINE 100 UNIT/ML ~~LOC~~ SOLN: 32.0000 [IU] | Freq: Every day | SUBCUTANEOUS | Status: DC

## 2011-12-28 MED ORDER — OSMOLITE 1.5 CAL PO LIQD
1000.0000 mL | ORAL | Status: DC
  Administered 2011-12-28: 1000 mL
  Filled 2011-12-28 (×4): qty 1000

## 2011-12-28 NOTE — Progress Notes (Signed)
Subjective:  Remains intubated and sedated.  Objective:  Vital Signs in the last 24 hours: Temp:  [98 F (36.7 C)-101.4 F (38.6 C)] 100.2 F (37.9 C) (04/29 0745) Pulse Rate:  [83-124] 109  (04/29 0745) Resp:  [16-27] 20  (04/29 0745) BP: (94-126)/(41-62) 111/59 mmHg (04/29 0745) SpO2:  [90 %-100 %] 96 % (04/29 0745) Arterial Line BP: (89-116)/(43-53) 104/46 mmHg (04/29 0345) FiO2 (%):  [40 %-41 %] 40 % (04/29 0745) Weight:  [88.2 kg (194 lb 7.1 oz)-88.8 kg (195 lb 12.3 oz)] 88.8 kg (195 lb 12.3 oz) (04/29 0645)  Intake/Output from previous day: 04/28 0701 - 04/29 0700 In: 3389.2 [I.V.:2724.2; NG/GT:365; IV Piggyback:300] Out: 4900 [Urine:10; Stool:150] Intake/Output from this shift:       . antiseptic oral rinse  15 mL Mouth Rinse QID  . aspirin  81 mg Per Tube Daily  . chlorhexidine  15 mL Mouth Rinse BID  . feeding supplement  30 mL Per Tube 5 X Daily  . heparin  5,000 Units Subcutaneous Q8H  . insulin aspart  0-15 Units Subcutaneous Q4H  . insulin glargine  32 Units Subcutaneous Daily  . levofloxacin (LEVAQUIN) IV  250 mg Intravenous Q24H  . pantoprazole (PROTONIX) IV  40 mg Intravenous Q24H  . potassium chloride  40 mEq Per Tube Daily  . potassium phosphate IVPB (mEq)  10 mEq Intravenous Once  . sodium chloride  3 mL Intravenous Q12H  . DISCONTD: insulin glargine  24 Units Subcutaneous Q24H  . DISCONTD: insulin glargine  32 Units Subcutaneous Q24H      . sodium chloride 20 mL/hr at 12/27/11 2000  . dextrose    . feeding supplement (JEVITY 1.2 CAL) 15 mL/hr at 12/27/11 1454  . fentaNYL infusion INTRAVENOUS 150 mcg/hr (12/28/11 0100)  . midazolam (VERSED) infusion 5 mg/hr (12/28/11 0200)  . milrinone 0.125 mcg/kg/min (12/28/11 0023)  . norepinephrine (LEVOPHED) Adult infusion Stopped (12/27/11 0400)  . phenylephrine (NEO-SYNEPHRINE) Adult infusion 150 mcg/min (12/28/11 0023)  . dialysate (PRISMASATE) 2,000 mL/hr at 12/27/11 1215  . DISCONTD: calcium  gluconate infusion for CRRT 20 g (12/26/11 1358)  . DISCONTD: dialysis replacement fluid (prismasate) 200 mL/hr at 12/25/11 0835  . DISCONTD: sodium citrate 2 %/dextrose 2.5% solution 3000 mL 250 mL/hr at 12/27/11 7829    Physical Exam: The patient is intubated and sedated. Chest: coarse rhonchi Heart: heart tones are distant and obscured by lung noise. Abdomen: No apparent tenderness Extremities: Moderate pedal edema.  Lab Results:  Basename 12/28/11 0438 12/27/11 1027 12/27/11 0430  WBC 17.6* -- 17.3*  HGB 7.2* 7.8* --  PLT 383 -- 379    Basename 12/28/11 0438 12/27/11 1027 12/27/11 0430  NA 135 135 --  K 3.5 3.5 --  CL 98 98 --  CO2 27 -- 26  GLUCOSE 157* 218* --  BUN 32* 30* --  CREATININE 3.54* 3.10* --   No results found for this basename: TROPONINI:2,CK,MB:2 in the last 72 hours Hepatic Function Panel  Basename 12/28/11 0438 12/27/11 0430  PROT -- 5.6*  ALBUMIN 1.9* --  AST -- 25  ALT -- 22  ALKPHOS -- 127*  BILITOT -- 0.4  BILIDIR -- --  IBILI -- --   No results found for this basename: CHOL in the last 72 hours No results found for this basename: PROTIME in the last 72 hours  Imaging: Imaging results have been reviewed. Chest xray shows mild CM and persistent bilateral infiltrates and mild congestion/edema Cardiac Studies:  Assessment/Plan:  Patient  Active Hospital Problem List: Hypotension (12/19/2011)   Assessment: Still requiring phenylephrine and milronone   Plan: Continue pressor support.  NSTEMI (non-ST elevated myocardial infarction) (12/19/2011)   Assessment: Rhythm stable sinus tachycardia   Plan: Obtain EKG and portable echo today to evaluate LV and RV function. Acute blood loss anemia (12/19/2011)   Assessment: Hgb down 7.2   Plan: Consider blood transfusion to help hypotension.      LOS: 10 days    Cassell Clement 12/28/2011, 8:16 AM

## 2011-12-28 NOTE — Progress Notes (Signed)
Patient ID: Reginald Gutierrez, male   DOB: [birth year], 46 y.o.   MRN: [id number]    [facility] Progress Note    Subjective:   No acute events overnight- remains pressor (Phenylephrine) dependent and now DNR.   Objective:   BP 106/51  Pulse 96  Temp(Src) 100.1 F (37.8 C) (Core (Comment))  Resp 20  Ht 5\' 6"  (1.676 m)  Wt 88.8 kg (195 lb 12.3 oz)  BMI 31.60 kg/m2  SpO2 98%  Intake/Output Summary (Last 24 hours) at 12/28/11 4098 Last data filed at 12/28/11 0600  Gross per 24 hour  Intake 3389.2 ml  Output   4900 ml  Net -1510.8 ml   Weight change: -4.4 kg (-9 lb 11.2 oz)  Physical Exam: JXB:[id number]- on IHD QIO:NGEXB RRR, normal S1 and S2  Resp:Coarse BS bilaterally, diminished over bases. No rhonchi MWU:XLKG, obese, NT, BS normal Ext:1+ LE edema (Exam limited by recent IM tibial nails)  Imaging: Dg Chest Port 1 View  12/27/2011  *RADIOLOGY REPORT*  Clinical Data: Evaluate ET tube is  PORTABLE CHEST - 1 VIEW  Comparison: 4/27/and  Findings: There is a left IJ catheter introducer sheath which appears unchanged from previous exam.  The ET tube tip is stable above the carina. Right IJ catheter tip in the SVC.  A nasogastric tube tip is below the field of view.  Mild cardiac enlargement.  Pleural effusions, bilateral airspace disease and interstitial edema is unchanged.  IMPRESSION:  1.  Stable support apparatus. 2.  No change in aeration to the lungs.  Original Report Authenticated By: Rosealee Albee, M.D.   Dg Chest Port 1 View  12/26/2011  *RADIOLOGY REPORT*  Clinical Data: Unresponsive  PORTABLE CHEST - 1 VIEW  Comparison: 12/25/2011  Findings: Stable tubular structures.  Stable bilateral airspace disease.  Upper normal heart size.  No pneumothorax.  Pleural effusions may be associated.  IMPRESSION: No significant change.  Bilateral extensive airspace disease.  Original Report Authenticated By: Donavan Burnet, M.D.    Labs: BMET  Lab 12/28/11 4010  12/27/11 1027 12/27/11 1009 12/27/11 2725 12/27/11 0617 12/27/11 0430 12/27/11 0425 12/26/11 1600 12/26/11 0500 12/25/11 1725 12/25/11 0439 12/24/11 1638  NA 135 135 137 138 134* 132* 133* -- -- -- -- --  K 3.5 3.5 3.5 3.4* 3.4* 3.2* 3.4* -- -- -- -- --  CL 98 98 96 96 98 94* 98 -- -- -- -- --  CO2 27 -- -- -- -- 26 -- 22 25 26 28 26   GLUCOSE 157* 218* 259* 253* 253* 276* 287* -- -- -- -- --  BUN 32* 30* 25* 26* 33* 33* 34* -- -- -- -- --  CREATININE 3.54* 3.10* 2.60* 2.40* 3.10* 3.22* 3.30* -- -- -- -- --  ALB -- -- -- -- -- -- -- -- -- -- -- --  CALCIUM 8.6 -- -- -- -- 8.2* -- 8.0* 8.2* 8.3* 8.5 8.2*  PHOS 2.5 -- -- -- -- 1.8* -- 1.8* 2.4 2.4 4.4 3.6   CBC  Lab 12/28/11 0438 12/27/11 1027 12/27/11 1009 12/27/11 0629 12/27/11 0430 12/25/11 0439 12/24/11 1712  WBC 17.6* -- -- -- 17.3* 21.0* 17.8*  NEUTROABS -- -- -- -- -- -- --  HGB 7.2* 7.8* 8.8* 8.5* -- -- --  HCT 22.4* 23.0* 26.0* 25.0* -- -- --  MCV 88.5 -- -- -- 88.3 88.4 86.1  PLT 383 -- -- -- 379 379 326    Medications:      . antiseptic  oral rinse  15 mL Mouth Rinse QID  . aspirin  81 mg Per Tube Daily  . chlorhexidine  15 mL Mouth Rinse BID  . feeding supplement  30 mL Per Tube 5 X Daily  . heparin  5,000 Units Subcutaneous Q8H  . insulin aspart  0-15 Units Subcutaneous Q4H  . insulin glargine  32 Units Subcutaneous Daily  . levofloxacin (LEVAQUIN) IV  250 mg Intravenous Q24H  . pantoprazole (PROTONIX) IV  40 mg Intravenous Q24H  . potassium chloride  40 mEq Per Tube Daily  . potassium phosphate IVPB (mEq)  10 mEq Intravenous Once  . sodium chloride  3 mL Intravenous Q12H  . DISCONTD: insulin glargine  24 Units Subcutaneous Q24H  . DISCONTD: insulin glargine  32 Units Subcutaneous Q24H     Assessment/ Plan:   1. AKI due to ATN from combination of IV dye and shock-Tolerated hemodialysis with 4L volume removal and attempting an equal UF goal today. Blood flow on dialysis is limited by his catheter (and would likely  need replacement today- appears positional and tPA unlikely to resolve issue). Overall prognosis remain poor with MOFS. If able to tolerate dialysis today- may need to resort to daily HD for volume management. 2. Volume excess/pulm edema- CXR/oxygenation unchanged with ultrafiltration overnight- reassess after today's treatment (If able to tolerate).  3. Acute coronary syndrome with positive CE's and wall motion abnormalities on ECHO, EF 25%- likely plans noted by cardiology for Rt. Heart catheterization to better understand volume status. 4. TYPE I DM 5. Aspiration pneumonia with VDRF on full support- on levaquin 6. S/p fall with bilat LE tibial fracture- S/P IM pinning- Per ortho  Zetta Bills, MD 12/28/2011, 7:22 AM

## 2011-12-28 NOTE — Progress Notes (Signed)
  Echocardiogram 2D Echocardiogram has been performed.  Reginald Gutierrez, Real Cons 12/28/2011, 4:38 PM

## 2011-12-28 NOTE — Progress Notes (Signed)
UR Completed.  Sha Burling Jane [phone] 12/28/2011  

## 2011-12-28 NOTE — Progress Notes (Signed)
Inpatient Diabetes Program Recommendations  AACE/ADA: New Consensus Statement on Inpatient Glycemic Control (2009)  Target Ranges:  Prepandial:   less than 140 mg/dL      Peak postprandial:   less than 180 mg/dL (1-2 hours)      Critically ill patients:  140 - 180 mg/dL   Reason for Visit: Results for Reginald Gutierrez, Reginald Gutierrez (MRN [id number]) as of 12/28/2011 15:35  Ref. Range 12/27/2011 19:12 12/27/2011 23:47 12/28/2011 03:32 12/28/2011 07:29  Glucose-Capillary Latest Range: 70-99 mg/dL 409 (H) 811 (H) 914 (H) 148 (H)   Note tube feed formula changed today.   If CBG's remain greater than 180 mg/dL, consider adding Novolog 3 units q 4 hours to cover carbohydrate in tube feeds.  Will follow.

## 2011-12-28 NOTE — Progress Notes (Signed)
Subjective: Sedated on vent  Objective: Vital signs in last 24 hours: Temp:  [98 F (36.7 C)-101.4 F (38.6 C)] 100.2 F (37.9 C) (04/29 0700) Pulse Rate:  [83-124] 94  (04/29 0700) Resp:  [14-27] 20  (04/29 0700) BP: (94-126)/(41-62) 111/52 mmHg (04/29 0700) SpO2:  [90 %-100 %] 99 % (04/29 0700) Arterial Line BP: (89-116)/(43-53) 104/46 mmHg (04/29 0345) FiO2 (%):  [40 %-41 %] 40 % (04/29 0700) Weight:  [88.2 kg (194 lb 7.1 oz)-88.8 kg (195 lb 12.3 oz)] 88.8 kg (195 lb 12.3 oz) (04/29 0645)  Intake/Output from previous day: 04/28 0701 - 04/29 0700 In: 3385.6 [I.V.:2720.6; NG/GT:365; IV Piggyback:300] Out: 4900 [Urine:10; Stool:150] Intake/Output this shift:     Basename 12/28/11 0438 12/27/11 1027 12/27/11 1009 12/27/11 0629 12/27/11 0617  HGB 7.2* 7.8* 8.8* 8.5* 7.5*    Basename 12/28/11 0438 12/27/11 1027 12/27/11 0430  WBC 17.6* -- 17.3*  RBC 2.53* -- 2.73*  HCT 22.4* 23.0* --  PLT 383 -- 379    Basename 12/28/11 0438 12/27/11 1027 12/27/11 0430  NA 135 135 --  K 3.5 3.5 --  CL 98 98 --  CO2 27 -- 26  BUN 32* 30* --  CREATININE 3.54* 3.10* --  GLUCOSE 157* 218* --  CALCIUM 8.6 -- 8.2*   No results found for this basename: LABPT:2,INR:2 in the last 72 hours  Sedated on vent, bilateral calfs soft, wounds clean approx with staples, no sign of infection or compartment synd. Pulses dopplered at [facility].  Assessment/Plan: S/P IM nail Bilateral tibia Fx. Will follow.   Jamelle Rushing 12/28/2011, 7:08 AM

## 2011-12-28 NOTE — Progress Notes (Signed)
ANTIBIOTIC CONSULT NOTE - INITIAL  Pharmacy Consult for Vancomycin/Fortaz Indication: rule out pneumonia  No Known Allergies  Patient Measurements: Height: 5\' 6"  (167.6 cm) Weight: 195 lb 12.3 oz (88.8 kg) IBW/kg (Calculated) : 63.8    Vital Signs: Temp: 100.2 F (37.9 C) (04/29 1000) BP: 87/43 mmHg (04/29 1000) Pulse Rate: 108  (04/29 1000) Intake/Output from previous day: 04/28 0701 - 04/29 0700 In: 3502.8 [I.V.:2812.8; NG/GT:390; IV Piggyback:300] Out: 4900 [Urine:10; Stool:150] Intake/Output from this shift: Total I/O In: 463.8 [I.V.:263.8; NG/GT:150; IV Piggyback:50] Out: 45 [Urine:45]  Labs:  [facility] 12/28/11 0438 12/27/11 1027 12/27/11 1009 12/27/11 0430  WBC 17.6* -- -- 17.3*  HGB 7.2* 7.8* 8.8* --  PLT 383 -- -- 379  LABCREA -- -- -- --  CREATININE 3.54* 3.10* 2.60* --   Estimated Creatinine Clearance: 27.5 ml/min (by C-G formula based on Cr of 3.54). No results found for this basename: VANCOTROUGH:2,VANCOPEAK:2,VANCORANDOM:2,GENTTROUGH:2,GENTPEAK:2,GENTRANDOM:2,TOBRATROUGH:2,TOBRAPEAK:2,TOBRARND:2,AMIKACINPEAK:2,AMIKACINTROU:2,AMIKACIN:2, in the last 72 hours   Microbiology: Recent Results (from the past 720 hour(s))  MRSA PCR SCREENING     Status: Normal   Collection Time   12/19/11  2:44 PM      Component Value Range Status Comment   MRSA by PCR NEGATIVE  NEGATIVE  Final   CULTURE, BLOOD (ROUTINE X 2)     Status: Normal   Collection Time   12/19/11  3:43 PM      Component Value Range Status Comment   Specimen Description BLOOD LEFT ARM  5 ML IN [facility] BOTTLE   Final    Special Requests NONE   Final    Culture  Setup Time [id number]   Final    Culture NO GROWTH 5 DAYS   Final    Report Status 12/25/2011 FINAL   Final   CULTURE, BLOOD (ROUTINE X 2)     Status: Normal   Collection Time   12/19/11  3:52 PM      Component Value Range Status Comment   Specimen Description BLOOD LEFT HAND  3 ML IN [facility] BOTTLE   Final    Special Requests NONE   Final    Culture  Setup Time [id number]   Final    Culture NO GROWTH 5 DAYS   Final    Report Status 12/25/2011 FINAL   Final   CULTURE, RESPIRATORY     Status: Normal   Collection Time   12/19/11  5:31 PM      Component Value Range Status Comment   Specimen Description TRACHEAL ASPIRATE   Final    Special Requests NONE   Final    Gram Stain     Final    Value: RARE WBC PRESENT,BOTH PMN AND MONONUCLEAR     RARE SQUAMOUS EPITHELIAL CELLS PRESENT     NO ORGANISMS SEEN   Culture Non-Pathogenic Oropharyngeal-type Flora Isolated.   Final    Report Status 12/22/2011 FINAL   Final   URINE CULTURE     Status: Normal   Collection Time   12/22/11  9:30 AM      Component Value Range Status Comment   Specimen Description URINE, CATHETERIZED   Final    Special Requests NONE   Final    Culture  Setup Time [id number]   Final    Colony Count NO GROWTH   Final    Culture NO GROWTH   Final    Report Status 12/23/2011 FINAL   Final   CULTURE, BLOOD (ROUTINE X 2)     Status: Normal (Preliminary result)  Collection Time   12/22/11 12:55 PM      Component Value Range Status Comment   Specimen Description BLOOD LEFT HAND   Final    Special Requests BOTTLES DRAWN AEROBIC AND ANAEROBIC [facility]   Final    Culture  Setup Time [id number]   Final    Culture     Final    Value:        BLOOD CULTURE RECEIVED NO GROWTH TO DATE CULTURE WILL BE HELD FOR 5 DAYS BEFORE ISSUING A FINAL NEGATIVE REPORT   Report Status PENDING   Incomplete   CULTURE, BLOOD (ROUTINE X 2)     Status: Normal (Preliminary result)   Collection Time   12/22/11  1:05 PM      Component Value Range Status Comment   Specimen Description BLOOD LEFT HAND   Final    Special Requests BOTTLES DRAWN AEROBIC AND ANAEROBIC 5CC   Final    Culture  Setup Time [id number]   Final    Culture     Final    Value:        BLOOD CULTURE RECEIVED NO GROWTH TO DATE CULTURE WILL BE HELD FOR 5 DAYS BEFORE ISSUING A FINAL NEGATIVE REPORT   Report Status PENDING    Incomplete     Medical History: Past Medical History  Diagnosis Date  . Diabetes mellitus type 1   . Hyperlipidemia   . Hypothyroidism   . Depression   . Umbilical hernia   . Hypertension     Medications:  Scheduled:    . antiseptic oral rinse  15 mL Mouth Rinse QID  . aspirin  81 mg Per Tube Daily  . chlorhexidine  15 mL Mouth Rinse BID  . feeding supplement  30 mL Per Tube QID  . heparin  5,000 Units Subcutaneous Q8H  . insulin aspart  0-15 Units Subcutaneous Q4H  . insulin glargine  32 Units Subcutaneous Daily  . levofloxacin (LEVAQUIN) IV  250 mg Intravenous Q24H  . pantoprazole sodium  40 mg Per Tube Q1200  . potassium chloride  40 mEq Per Tube Daily  . potassium phosphate IVPB (mEq)  10 mEq Intravenous Once  . sodium chloride  3 mL Intravenous Q12H  . DISCONTD: feeding supplement  30 mL Per Tube 5 X Daily  . DISCONTD: insulin glargine  32 Units Subcutaneous Q24H  . DISCONTD: pantoprazole (PROTONIX) IV  40 mg Intravenous Q24H   Infusions:    . sodium chloride 20 mL/hr at 12/27/11 2000  . dextrose    . feeding supplement (OSMOLITE 1.5 CAL)    . fentaNYL infusion INTRAVENOUS 200 mcg/hr (12/28/11 1000)  . midazolam (VERSED) infusion 5 mg/hr (12/28/11 0856)  . milrinone 0.125 mcg/kg/min (12/28/11 0023)  . norepinephrine (LEVOPHED) Adult infusion Stopped (12/27/11 0400)  . phenylephrine (NEO-SYNEPHRINE) Adult infusion 120 mcg/min (12/28/11 0921)  . dialysate (PRISMASATE) 2,000 mL/hr at 12/27/11 1215  . DISCONTD: calcium gluconate infusion for CRRT 20 g (12/26/11 1358)  . DISCONTD: feeding supplement (JEVITY 1.2 CAL) 15 mL/hr at 12/27/11 1454  . DISCONTD: dialysis replacement fluid (prismasate) 200 mL/hr at 12/25/11 0835  . DISCONTD: sodium citrate 2 %/dextrose 2.5% solution 3000 mL 250 mL/hr at 12/27/11 0311   Anti-infectives     Start     Dose/Rate Route Frequency Ordered Stop   12/26/11 0915   Levofloxacin (LEVAQUIN) IVPB 250 mg        250 mg 50 mL/hr over 60  Minutes Intravenous Every 24 hours 12/25/11 1034 12/30/11  4098   12/24/11 1000   vancomycin (VANCOCIN) IVPB 1000 mg/200 mL premix  Status:  Discontinued        1,000 mg 200 mL/hr over 60 Minutes Intravenous Every 24 hours 12/23/11 0843 12/25/11 0958   12/23/11 1200   piperacillin-tazobactam (ZOSYN) IVPB 2.25 g  Status:  Discontinued        2.25 g 100 mL/hr over 30 Minutes Intravenous 4 times per day 12/23/11 0910 12/25/11 0958   12/23/11 0915   Levofloxacin (LEVAQUIN) IVPB 250 mg  Status:  Discontinued        250 mg 50 mL/hr over 60 Minutes Intravenous Every 24 hours 12/23/11 0905 12/25/11 1034   12/22/11 1300   piperacillin-tazobactam (ZOSYN) IVPB 2.25 g  Status:  Discontinued        2.25 g 100 mL/hr over 30 Minutes Intravenous Every 8 hours 12/22/11 1233 12/23/11 0910   12/21/11 2359   vancomycin (VANCOCIN) 1,250 mg in sodium chloride 0.9 % 250 mL IVPB  Status:  Discontinued        1,250 mg 166.7 mL/hr over 90 Minutes Intravenous Every 12 hours 12/21/11 1532 12/22/11 0722   12/21/11 1330   vancomycin (VANCOCIN) IVPB 1000 mg/200 mL premix  Status:  Discontinued        1,000 mg 200 mL/hr over 60 Minutes Intravenous Every 8 hours 12/21/11 1225 12/21/11 1532   12/19/11 1330   vancomycin (VANCOCIN) IVPB 1000 mg/200 mL premix  Status:  Discontinued        1,000 mg 200 mL/hr over 60 Minutes Intravenous Every 8 hours 12/19/11 1302 12/21/11 1107   12/19/11 1315   piperacillin-tazobactam (ZOSYN) IVPB 3.375 g  Status:  Discontinued        3.375 g 12.5 mL/hr over 240 Minutes Intravenous Every 8 hours 12/19/11 1302 12/22/11 1233         Assessment: 45 yom admitted for bilateral tibial fracture s/p syncope (was stable after surgery 4/19), but then transferred to ICU with s/sx's of septic shock and NSTEMI. Pt previously on vancomycin and Zosyn, now only on Levaquin. Pt continues to have fevers and potential new worsening infiltrate. Pharmacy consulted to re-broaden antibiotics to vancomycin  and fortaz. Pt renal fx is poor and has been converted from CVVHD to intermittent hemodialysis. Pt tolerated 4 hrs of dialysis on 4/28 with BFR @ 200 ml/min. Today pt scheduled for HD but no blood flow through HD catheter, plan to replace today. WBC is elevated @ 17.6  Abx:  4/24 levo >> 5/1 (stop date entered) 4/20 zosyn >>4/26 4/20 vanc >> (10mg /kg q24 on CRRT) 4/26 restart 4/29  4/29 fortaz  Cultures: MRSA (-) 4/29 repeat resp cx:  4/29 repeat urine 4/29 repeat bld x 2 4/23 blood: pending 4/23 urine: negative 4/20 trach aspirate: NPF 4/20 blood: ngtd   Goal of Therapy:  Pre-HD vancomycin level 15-25 mcg/ml  Plan:  1) Vancomycin 2 gram IV Loading dose x 1 today  2) F/U HD plans and toleration after new cath placed to determine further vancomycin doses 3) Fortaz 1 gram IV q24 hrs starting after HD today 4) F/U HD schedule/plans and HD toleration 5) Monitor fever and WBC trends and repeat cxs  Janace Litten, PharmD [phone] 12/28/2011,11:10 AM

## 2011-12-28 NOTE — Progress Notes (Signed)
Name: Reginald Gutierrez MRN: [id number] DOB: January 30, 1966    LOS: 10  PCCM PROGRESS NOTE  History of Present Illness: 46 y/o with history of DM and cocaine abuse admitted with bilateral tibia fracture s/p syncopal episode on 4/19.  Postoperatively he developed acute hypoxia and refractory shock, transient ST depression and troponin leak.  Course is complicated by ARDS requiring HFOV and acute renal failure requiring CVVHD.  Lines / Drains: 4/20 OETT >>> 4/20  OGT >>> 4/20 R IJ CVL >>> 4/20 R fem A-line >>> 4/23 L IJ DH >>>  Cultures: 4/20  Blood >>>negative 4/20  Sputum >>>negative 4/23  Blood >>> negative 4/23  Urine >>>negative 4/29 Sputum>>> 4/29 Urine>>> 4/29 Blood>>>  Abx: Zosyn 4/20 (empirical, G-, anaerobes) >>>4/26 Vanc 4/20 (empirical, MRSA) >>>4/26 Vanc restart 4/29>>> Levaquin 4/24 (empirical, atypical) >>> Ceftazidime Elita Quick) 4/29>>>  Tests / Events: 4/19  Admission, repair of bilat tibia Fx 4/20  Refractory shock, hypoxemic resp failure, NSTEMI.  Intubated 4/20  CT head >>> NAD 4/20  TTE >>> EF 25 to 30%, akinesis of mid/distal anteroseptal and apical area 4/21  Chest CT angio >>> No central pulmonary embolism. Slight progression of right greater than left airspace disease. 4/23  Renal US >>> no hydronephrosis, no urine obstruction 4/23  HFOV due to refractory hypoxemia.  Vasopressors.  CVVHD. 4/24  Off HFOV  Subjective/interval: 1. Off Levophed, still on Neo 2. Milrinone, minimal UOP 3. switched CVVHD to HD  Vital Signs: Temp:  [98 F (36.7 C)-101.4 F (38.6 C)] 100.3 F (37.9 C) (04/29 0500) Pulse Rate:  [83-124] 93  (04/29 0500) Resp:  [14-27] 20  (04/29 0500) BP: (94-126)/(41-62) 104/54 mmHg (04/29 0500) SpO2:  [90 %-100 %] 100 % (04/29 0500) Arterial Line BP: (89-116)/(43-53) 104/46 mmHg (04/29 0345) FiO2 (%):  [40 %-41 %] 40 % (04/29 0328) Weight:  [194 lb 7.1 oz (88.2 kg)] 194 lb 7.1 oz (88.2 kg) (04/28 1957) I/O last 3 completed shifts: In:  6038.2 [I.V.:5378.2; NG/GT:310; IV Piggyback:350] Out: 4565 [Urine:25; Emesis/NG output:600; Other:3910; Stool:30]  Physical Examination: General:  Intubated, mechanically ventilated, no acute distress Neuro:  Sedated, synchronous, nonfocal HEENT:  PERRL, pink conjunctivae, moist membranes Neck:  Supple, no JVD   Cardiovascular:  RRR, no M/G/R Lungs: rhonchi and rales bilaterally, worse on the right side. Abdomen:  distended, decreased BS.  Musculoskeletal: B LE injuries.  Skin:  No rash  Ventilator settings: Vent Mode:  [-] PRVC FiO2 (%):  [40 %-41 %] 40 % Set Rate:  [20 bmp-30 bmp] 20 bmp Vt Set:  [520 mL] 520 mL PEEP:  [8 cmH20] 8 cmH20 Plateau Pressure:  [21 cmH20-26 cmH20] 23 cmH20  Labs and Imaging:  Reviewed.  Please refer to the Assessment and Plan section for relevant results.  ASSESSMENT AND PLAN  NEUROLOGIC A:  Acute encephalopathy (metabolic, medications, anoxia, hypoglycemia).  History of Cocaine abuse (clean 5-10 years). P: -->  Versed / Fentanyl gtt to goal RASS 0 to -1 -->  daily WUA  PULMONARY  Lab 12/27/11 1253 12/27/11 0430 12/25/11 0513 12/24/11 0249 12/23/11 2014  PHART 7.441 7.391 7.254* 7.267* 7.319*  PCO2ART 45.4* 45.3* 66.0* 67.4* 63.0*  PO2ART 68.0* 85.0 64.0* 64.0* 106.0*  HCO3 30.5* 27.5* 29.8* 30.8* 32.8*  O2SAT 93.0 96.0 90.0 88.0 98.0   4/29  CXR >>> worsening ifiltrates/atx, R>L  A:   Acute hypoxemic / hypercarbic respiratory failure.  Pulmoanry edema more likely as baseline decreased EF and acute MI.  Less likely ARDS. P:  -->  Ventilation -->  Goal pH > 7.25, SpO2 > 88, FiO2 < 0.5 -->  Daily WUA/SBT  CARDIOVASCULAR  Lab 12/28/11 0438 12/23/11 0418  TROPONINI -- --  LATICACIDVEN -- --  PROBNP 32116.0* 16645.0*   -4/20  ECHO EF 25%, some regional wall motion abnormalities. -ProBNP trending up from 16645 on 4/24 to 32116 on 4/29  A:  NSTEMI, likely intraoperative (on the background of poorly controlled DM).  Acute  cardiomyopathy / congestive heart failure.  Cardiogenic shock, less likely septic.   -->  Continue ASA,  -->  Heparin completed -->  Hold Metoprolol as in shock -->  Continue Milrinone to optimize cardiac output -->  Neo-Synephrine (1st choice) then Levophed (2d choice) to keep MAP > 60 --> repeat 2 D Echo  RENAL  Lab 12/28/11 0438 12/27/11 1027 12/27/11 1009 12/27/11 0629 12/27/11 0617 12/27/11 0430 12/26/11 1600 12/26/11 0500 12/25/11 1725 12/25/11 0439 12/24/11 0246 12/23/11 0417  NA 135 135 137 138 134* -- -- -- -- -- -- --  K 3.5 3.5 -- -- -- -- -- -- -- -- -- --  CL 98 98 96 96 98 -- -- -- -- -- -- --  CO2 27 -- -- -- -- 26 22 25 26  -- -- --  BUN 32* 30* 25* 26* 33* -- -- -- -- -- -- --  CREATININE 3.54* 3.10* 2.60* 2.40* 3.10* -- -- -- -- -- -- --  CALCIUM 8.6 -- -- -- -- 8.2* 8.0* 8.2* 8.3* -- -- --  MG -- -- -- -- -- 2.4 -- 2.6* -- 2.6* 2.3 2.2  PHOS 2.5 -- -- -- -- 1.8* 1.8* 2.4 2.4 -- -- --   I/O last 3 completed shifts: In: 6038.2 [I.V.:5378.2; NG/GT:310; IV Piggyback:350] Out: 4565 [Urine:25; Emesis/NG output:600; Other:3910; Stool:30]  A:  Acute renal failure / AKI.   P: -->  Continue HD, renal following -->  CVP checks to qshift, goal < 4 if BP tolerates -->  BMP in AM  GASTROINTESTINAL  Lab 12/28/11 0438 12/27/11 0430 12/26/11 1600 12/26/11 0500 12/25/11 1725 12/23/11 0418 12/22/11 0415  AST -- 25 -- -- -- 52* 73*  ALT -- 22 -- -- -- 50 59*  ALKPHOS -- 127* -- -- -- 117 101  BILITOT -- 0.4 -- -- -- 0.5 0.4  PROT -- 5.6* -- -- -- 4.5* 5.2*  ALBUMIN 1.9* 1.9* 1.8* 1.9* 1.9* -- --   A:  Shocked liver.  Ileus.  Malnutrition.  P: --> TF  --> GI Px  HEMATOLOGIC  Lab 12/28/11 0438 12/27/11 1027 12/27/11 1009 12/27/11 0629 12/27/11 0617 12/27/11 0430 12/25/11 0439 12/24/11 1712 12/24/11 0246 12/23/11 0418  HGB 7.2* 7.8* 8.8* 8.5* 7.5* -- -- -- -- --  HCT 22.4* 23.0* 26.0* 25.0* 22.0* -- -- -- -- --  PLT 383 -- -- -- -- 379 379 326 278 --  INR -- -- -- -- --  -- -- -- -- --  APTT -- -- -- -- -- -- 85* -- 69* 45*   A: Acute anemia.  No overt hemorrhage.  P: --> CBC an AM --> Goal Hb > 8, given AMI --> transfuse 1 U blood   INFECTIOUS  Lab 12/28/11 0438 12/27/11 0430 12/25/11 1030 12/25/11 0439 12/24/11 1712 12/24/11 0246 12/23/11 0418 12/22/11 0415  WBC 17.6* 17.3* -- 21.0* 17.8* 17.2* -- --  PROCALCITON -- -- 7.08 -- -- -- 9.54 17.52   A:  Suspected pneumonia. Concerning for ventilator associated PNA.  P: --> Continue Levaquin, -->  Restart Vancomycin  --> Start ceftazidime  ENDOCRINE  Lab 12/28/11 0332 12/27/11 2347 12/27/11 1912 12/27/11 1153 12/27/11 0712  GLUCAP 162* 254* 192* 186* 214*   TSH 3.746, Cortisol 30.4  A:  IDDM, hyperglycemia. History of hypothyroidism, but low TSH on admission. P: -->  SSI/CBG --> lantus to 32u   MUSCULOSKELETAL A:  Tibia fracture P:  Per Ortho  BEST PRACTICE / DISPOSITION -->  ICU status under PCCM -->  Cardiology, Nephrology and Ortho consulting -->  Full code -->  TF -->  Heparin for DVT Px -->  Protonix IV for GI Px -->  Family not available am 4/28  Anselmo Pickler, MD (PGY 1)  Reviewed above, examined pt, and agree with assessment/plan.  He has new fever, and increased infiltrate RUL associate with increased secretions.  Will start vanc, fortaz and repeat cultures.  Has difficulty with HD cath (?cath too short)>>will change to 20 cm HD cath.  Continue pressors and milrinone to maintain SBP > 90, MAP > 60.  CVP goal 10 to 14.  Updated pt's mother over the phone.  Critical care time 35 minutes.  Coralyn Helling, MD 12/28/2011, 1:31 PM Pager:  [phone]

## 2011-12-28 NOTE — Progress Notes (Signed)
Nutrition Follow-up  Patient remains on ventilator.  HD, CVVHD, and ultrafiltration have been unsuccessful per RN.  Tolerating TF well.  Diet Order:  Jevity 1.2 at 40 ml/h with Prostat 30 ml 5 times daily (via OG tube) providing 1652 kcals, 128 grams protein, 778 ml free water daily.  Meds: Scheduled Meds:   . antiseptic oral rinse  15 mL Mouth Rinse QID  . aspirin  81 mg Per Tube Daily  . chlorhexidine  15 mL Mouth Rinse BID  . feeding supplement  30 mL Per Tube 5 X Daily  . heparin  5,000 Units Subcutaneous Q8H  . insulin aspart  0-15 Units Subcutaneous Q4H  . insulin glargine  32 Units Subcutaneous Daily  . levofloxacin (LEVAQUIN) IV  250 mg Intravenous Q24H  . pantoprazole (PROTONIX) IV  40 mg Intravenous Q24H  . potassium chloride  40 mEq Per Tube Daily  . potassium phosphate IVPB (mEq)  10 mEq Intravenous Once  . sodium chloride  3 mL Intravenous Q12H  . DISCONTD: insulin glargine  32 Units Subcutaneous Q24H   Continuous Infusions:   . sodium chloride 20 mL/hr at 12/27/11 2000  . dextrose    . feeding supplement (JEVITY 1.2 CAL) 15 mL/hr at 12/27/11 1454  . fentaNYL infusion INTRAVENOUS 150 mcg/hr (12/28/11 0100)  . midazolam (VERSED) infusion 5 mg/hr (12/28/11 0856)  . milrinone 0.125 mcg/kg/min (12/28/11 0023)  . norepinephrine (LEVOPHED) Adult infusion Stopped (12/27/11 0400)  . phenylephrine (NEO-SYNEPHRINE) Adult infusion 120 mcg/min (12/28/11 0921)  . dialysate (PRISMASATE) 2,000 mL/hr at 12/27/11 1215  . DISCONTD: calcium gluconate infusion for CRRT 20 g (12/26/11 1358)  . DISCONTD: dialysis replacement fluid (prismasate) 200 mL/hr at 12/25/11 0835  . DISCONTD: sodium citrate 2 %/dextrose 2.5% solution 3000 mL 250 mL/hr at 12/27/11 0311   PRN Meds:.acetaminophen (TYLENOL) oral liquid 160 mg/5 mL, artificial tears, fentaNYL, heparin, heparin, midazolam, ondansetron (ZOFRAN) IV, DISCONTD: heparin, DISCONTD: heparin, DISCONTD: heparin  Labs:  CMP     Component Value  Date/Time   NA 135 12/28/2011 0438   K 3.5 12/28/2011 0438   CL 98 12/28/2011 0438   CO2 27 12/28/2011 0438   GLUCOSE 157* 12/28/2011 0438   BUN 32* 12/28/2011 0438   CREATININE 3.54* 12/28/2011 0438   CALCIUM 8.6 12/28/2011 0438   PROT 5.6* 12/27/2011 0430   ALBUMIN 1.9* 12/28/2011 0438   AST 25 12/27/2011 0430   ALT 22 12/27/2011 0430   ALKPHOS 127* 12/27/2011 0430   BILITOT 0.4 12/27/2011 0430   GFRNONAA 19* 12/28/2011 0438   GFRAA 22* 12/28/2011 0438  Phosphorus 2.5 WNL  CBG (last 3)   Basename 12/28/11 0729 12/28/11 0332 12/27/11 2347  GLUCAP 148* 162* 254*     Intake/Output Summary (Last 24 hours) at 12/28/11 0959 Last data filed at 12/28/11 0600  Gross per 24 hour  Intake   3061 ml  Output   4509 ml  Net  -1448 ml    Weight Status:  88.8 kg (trending back down with volume removal)  Re-estimated needs:  2050 kcals, 115-130 grams protein daily  Nutrition Dx:  Inadequate oral intake, ongoing.  Goal:  TF to meet 90-100% estimated needs, unmet.  Intervention:  Change TF to a more concentrated formula, Osmolite 1.5 at 45 ml/h with Prostat 30 ml QID to provide 2020 kcals, 128 grams protein, 848 ml free water daily.  Monitor:  TF tolerance, weight trend, labs, renal function.   Hettie Holstein Pager #:  [phone]

## 2011-12-28 NOTE — Procedures (Signed)
Hemodialysis Insertion Procedure Note Reginald Gutierrez [id number] 10/04/1965  Procedure: Insertion of Hemodialysis Catheter Type: 3 port  Indications: Hemodialysis   Procedure Details Consent: Risks of procedure as well as the alternatives and risks of each were explained to the (patient/caregiver).  Consent for procedure obtained. Time Out: Verified patient identification, verified procedure, site/side was marked, verified correct patient position, special equipment/implants available, medications/allergies/relevent history reviewed, required imaging and test results available.  Performed  Maximum sterile technique was used including antiseptics, cap, gloves, gown, hand hygiene, mask and sheet. Skin prep: Chlorhexidine; local anesthetic administered A antimicrobial bonded/coated triple lumen catheter was placed in the left internal jugular vein using the Seldinger technique. Ultrasound guidance used.no Catheter placed to 20 cm. Blood aspirated via all 3 ports and then flushed x 3. Line sutured x 2 and dressing applied.  Evaluation Blood flow good Complications: No apparent complications Patient did tolerate procedure well. Chest X-ray ordered to verify placement.  CXR: pending.  Brett Canales Minor ACNP [facility]ack PCCM Pager [phone] till 3 pm If no answer page [phone] 12/28/2011, 1:50 PM  I was present for procedure.  Coralyn Helling, MD 12/28/2011, 2:27 PM

## 2011-12-29 ENCOUNTER — Inpatient Hospital Stay (HOSPITAL_COMMUNITY): Payer: Medicare Other

## 2011-12-29 LAB — RENAL FUNCTION PANEL
Albumin: 2.1 g/dL — ABNORMAL LOW (ref 3.5–5.2)
Calcium: 8.4 mg/dL (ref 8.4–10.5)
GFR calc Af Amer: 25 mL/min — ABNORMAL LOW (ref >90)
Glucose, Bld: 240 mg/dL — ABNORMAL HIGH (ref 70–99)
Phosphorus: 2.9 mg/dL (ref 2.3–4.6)
Potassium: 4.1 mEq/L (ref 3.5–5.1)
Sodium: 134 mEq/L — ABNORMAL LOW (ref 135–145)

## 2011-12-29 LAB — CBC
HCT: 25.7 % — ABNORMAL LOW (ref 39.0–52.0)
MCH: 28.7 pg (ref 26.0–34.0)
MCV: 87.7 fL (ref 78.0–100.0)
Platelets: 363 10*3/uL (ref 150–400)
RDW: 15.9 % — ABNORMAL HIGH (ref 11.5–15.5)
WBC: 20.4 10*3/uL — ABNORMAL HIGH (ref 4.0–10.5)

## 2011-12-29 LAB — URINE CULTURE
Colony Count: NO GROWTH
Culture  Setup Time: 201304291100

## 2011-12-29 LAB — CULTURE, BLOOD (ROUTINE X 2)
Culture  Setup Time: 201304240018
Culture: NO GROWTH

## 2011-12-29 LAB — GLUCOSE, CAPILLARY
Glucose-Capillary: 198 mg/dL — ABNORMAL HIGH (ref 70–99)
Glucose-Capillary: 192 mg/dL — ABNORMAL HIGH (ref 70–99)
Glucose-Capillary: 204 mg/dL — ABNORMAL HIGH (ref 70–99)

## 2011-12-29 MED ORDER — LEVOFLOXACIN IN D5W 250 MG/50ML IV SOLN
250.0000 mg | INTRAVENOUS | Status: DC
  Filled 2011-12-29: qty 50

## 2011-12-29 MED ORDER — INSULIN ASPART 100 UNIT/ML ~~LOC~~ SOLN
3.0000 [IU] | SUBCUTANEOUS | Status: DC
  Administered 2011-12-29 – 2012-01-02 (×16): 3 [IU] via SUBCUTANEOUS
  Administered 2012-01-02: 16:00:00 via SUBCUTANEOUS
  Administered 2012-01-02: 3 [IU] via SUBCUTANEOUS
  Administered 2012-01-03: 16:00:00 via SUBCUTANEOUS
  Administered 2012-01-03 – 2012-01-05 (×9): 3 [IU] via SUBCUTANEOUS

## 2011-12-29 MED ORDER — POTASSIUM CHLORIDE 20 MEQ/15ML (10%) PO LIQD
ORAL | Status: AC
  Administered 2011-12-29: 40 meq
  Filled 2011-12-29: qty 30

## 2011-12-29 MED ORDER — LEVOTHYROXINE SODIUM 50 MCG PO TABS
50.0000 ug | ORAL_TABLET | Freq: Every day | ORAL | Status: DC
  Administered 2011-12-30 – 2012-01-07 (×9): 50 ug via ORAL
  Filled 2011-12-29 (×10): qty 1

## 2011-12-29 MED ORDER — VANCOMYCIN HCL IN DEXTROSE 1-5 GM/200ML-% IV SOLN
1000.0000 mg | INTRAVENOUS | Status: AC | PRN
  Administered 2011-12-29: 1000 mg via INTRAVENOUS
  Filled 2011-12-29: qty 200

## 2011-12-29 MED ORDER — OSMOLITE 1.5 CAL PO LIQD
1000.0000 mL | ORAL | Status: DC
  Administered 2011-12-29: 1000 mL
  Filled 2011-12-29 (×3): qty 1000

## 2011-12-29 NOTE — Progress Notes (Signed)
ANTIBIOTIC CONSULT NOTE - FOLLOW-UP CONSULT  Pharmacy Consult for Vancomycin Indication: rule out pneumonia  No Known Allergies  Patient Measurements: Height: 5\' 6"  (167.6 cm) Weight: 189 lb 6 oz (85.9 kg) IBW/kg (Calculated) : 63.8    Vital Signs: Temp: 99.5 F (37.5 C) (04/30 0930) Temp src: Core (Comment) (04/30 0645) BP: 113/57 mmHg (04/30 0900) Pulse Rate: 118  (04/30 0930) Intake/Output from previous day: 04/29 0701 - 04/30 0700 In: 4472.2 [I.V.:2412.2; Blood:500; NG/GT:960; IV Piggyback:600] Out: 5204 [Urine:100] Intake/Output from this shift: Total I/O In: 520.5 [I.V.:240.5; NG/GT:230; IV Piggyback:50] Out: 20 [Urine:20]  Labs:  [facility] 12/29/11 0500 12/28/11 0438 12/27/11 1027 12/27/11 0430  WBC 20.4* 17.6* -- 17.3*  HGB 8.4* 7.2* 7.8* --  PLT 363 383 -- 379  LABCREA -- -- -- --  CREATININE 3.25* 3.54* 3.10* --   Estimated Creatinine Clearance: 29.5 ml/min (by C-G formula based on Cr of 3.25). No results found for this basename: VANCOTROUGH:2,VANCOPEAK:2,VANCORANDOM:2,GENTTROUGH:2,GENTPEAK:2,GENTRANDOM:2,TOBRATROUGH:2,TOBRAPEAK:2,TOBRARND:2,AMIKACINPEAK:2,AMIKACINTROU:2,AMIKACIN:2, in the last 72 hours   Microbiology: Recent Results (from the past 720 hour(s))  MRSA PCR SCREENING     Status: Normal   Collection Time   12/19/11  2:44 PM      Component Value Range Status Comment   MRSA by PCR NEGATIVE  NEGATIVE  Final   CULTURE, BLOOD (ROUTINE X 2)     Status: Normal   Collection Time   12/19/11  3:43 PM      Component Value Range Status Comment   Specimen Description BLOOD LEFT ARM  5 ML IN [facility] BOTTLE   Final    Special Requests NONE   Final    Culture  Setup Time [id number]   Final    Culture NO GROWTH 5 DAYS   Final    Report Status 12/25/2011 FINAL   Final   CULTURE, BLOOD (ROUTINE X 2)     Status: Normal   Collection Time   12/19/11  3:52 PM      Component Value Range Status Comment   Specimen Description BLOOD LEFT HAND  3 ML IN [facility] BOTTLE    Final    Special Requests NONE   Final    Culture  Setup Time [id number]   Final    Culture NO GROWTH 5 DAYS   Final    Report Status 12/25/2011 FINAL   Final   CULTURE, RESPIRATORY     Status: Normal   Collection Time   12/19/11  5:31 PM      Component Value Range Status Comment   Specimen Description TRACHEAL ASPIRATE   Final    Special Requests NONE   Final    Gram Stain     Final    Value: RARE WBC PRESENT,BOTH PMN AND MONONUCLEAR     RARE SQUAMOUS EPITHELIAL CELLS PRESENT     NO ORGANISMS SEEN   Culture Non-Pathogenic Oropharyngeal-type Flora Isolated.   Final    Report Status 12/22/2011 FINAL   Final   URINE CULTURE     Status: Normal   Collection Time   12/22/11  9:30 AM      Component Value Range Status Comment   Specimen Description URINE, CATHETERIZED   Final    Special Requests NONE   Final    Culture  Setup Time [id number]   Final    Colony Count NO GROWTH   Final    Culture NO GROWTH   Final    Report Status 12/23/2011 FINAL   Final   CULTURE, BLOOD (ROUTINE X 2)  Status: Normal   Collection Time   12/22/11 12:55 PM      Component Value Range Status Comment   Specimen Description BLOOD LEFT HAND   Final    Special Requests BOTTLES DRAWN AEROBIC AND ANAEROBIC [facility]   Final    Culture  Setup Time [id number]   Final    Culture NO GROWTH 5 DAYS   Final    Report Status 12/29/2011 FINAL   Final   CULTURE, BLOOD (ROUTINE X 2)     Status: Normal   Collection Time   12/22/11  1:05 PM      Component Value Range Status Comment   Specimen Description BLOOD LEFT HAND   Final    Special Requests BOTTLES DRAWN AEROBIC AND ANAEROBIC 5CC   Final    Culture  Setup Time [id number]   Final    Culture NO GROWTH 5 DAYS   Final    Report Status 12/29/2011 FINAL   Final   CULTURE, BLOOD (ROUTINE X 2)     Status: Normal (Preliminary result)   Collection Time   12/28/11  9:30 AM      Component Value Range Status Comment   Specimen Description BLOOD LEFT HAND   Final     Special Requests BOTTLES DRAWN AEROBIC ONLY 5CC   Final    Culture  Setup Time [id number]   Final    Culture     Final    Value:        BLOOD CULTURE RECEIVED NO GROWTH TO DATE CULTURE WILL BE HELD FOR 5 DAYS BEFORE ISSUING A FINAL NEGATIVE REPORT   Report Status PENDING   Incomplete   CULTURE, BLOOD (ROUTINE X 2)     Status: Normal (Preliminary result)   Collection Time   12/28/11  9:45 AM      Component Value Range Status Comment   Specimen Description BLOOD LEFT HAND   Final    Special Requests BOTTLES DRAWN AEROBIC ONLY [facility]   Final    Culture  Setup Time [id number]   Final    Culture     Final    Value:        BLOOD CULTURE RECEIVED NO GROWTH TO DATE CULTURE WILL BE HELD FOR 5 DAYS BEFORE ISSUING A FINAL NEGATIVE REPORT   Report Status PENDING   Incomplete   CULTURE, RESPIRATORY     Status: Normal (Preliminary result)   Collection Time   12/28/11  9:57 AM      Component Value Range Status Comment   Specimen Description ENDOTRACHEAL   Final    Special Requests NONE   Final    Gram Stain     Final    Value: FEW WBC PRESENT,BOTH PMN AND MONONUCLEAR     NO SQUAMOUS EPITHELIAL CELLS SEEN     NO ORGANISMS SEEN   Culture NO GROWTH 1 DAY   Final    Report Status PENDING   Incomplete     Medical History: Past Medical History  Diagnosis Date  . Diabetes mellitus type 1   . Hyperlipidemia   . Hypothyroidism   . Depression   . Umbilical hernia   . Hypertension     Medications:  Scheduled:     . antiseptic oral rinse  15 mL Mouth Rinse QID  . aspirin  81 mg Per Tube Daily  . cefTAZidime (FORTAZ)  IV  1 g Intravenous Q24H  . chlorhexidine  15 mL Mouth Rinse BID  . feeding supplement  30  mL Per Tube QID  . heparin  5,000 Units Subcutaneous Q8H  . insulin aspart  0-15 Units Subcutaneous Q4H  . insulin aspart  3 Units Subcutaneous Q4H  . insulin glargine  32 Units Subcutaneous Daily  . levofloxacin (LEVAQUIN) IV  250 mg Intravenous Q24H  . levofloxacin (LEVAQUIN) IV  250 mg  Intravenous Q24H  . levothyroxine  50 mcg Oral QAC breakfast  . pantoprazole sodium  40 mg Per Tube Q1200  . potassium chloride  40 mEq Per Tube Daily  . sodium chloride  3 mL Intravenous Q12H  . vancomycin  2,000 mg Intravenous Once  . DISCONTD: insulin glargine  32 Units Subcutaneous Daily   Infusions:     . sodium chloride 20 mL/hr at 12/27/11 2000  . dextrose    . feeding supplement (OSMOLITE 1.5 CAL) 1,000 mL (12/28/11 1646)  . fentaNYL infusion INTRAVENOUS Stopped (12/29/11 0815)  . midazolam (VERSED) infusion Stopped (12/29/11 0815)  . milrinone Stopped (12/29/11 1020)  . norepinephrine (LEVOPHED) Adult infusion Stopped (12/27/11 0400)  . phenylephrine (NEO-SYNEPHRINE) Adult infusion 100 mcg/min (12/29/11 1020)  . DISCONTD: dialysate (PRISMASATE) 2,000 mL/hr at 12/27/11 1215   Anti-infectives     Start     Dose/Rate Route Frequency Ordered Stop   12/30/11 0800   Levofloxacin (LEVAQUIN) IVPB 250 mg        250 mg 50 mL/hr over 60 Minutes Intravenous Every 24 hours 12/29/11 1000     12/29/11 1200   vancomycin (VANCOCIN) IVPB 1000 mg/200 mL premix        1,000 mg 200 mL/hr over 60 Minutes Intravenous Every Hemodialysis 12/29/11 1109     12/28/11 1300   cefTAZidime (FORTAZ) 1 g in dextrose 5 % 50 mL IVPB        1 g 100 mL/hr over 30 Minutes Intravenous Every 24 hours 12/28/11 1146     12/28/11 1230   vancomycin (VANCOCIN) 2,000 mg in sodium chloride 0.9 % 500 mL IVPB        2,000 mg 250 mL/hr over 120 Minutes Intravenous  Once 12/28/11 1146 12/28/11 1435   12/26/11 0915   Levofloxacin (LEVAQUIN) IVPB 250 mg        250 mg 50 mL/hr over 60 Minutes Intravenous Every 24 hours 12/25/11 1034 12/29/11 0950   12/24/11 1000   vancomycin (VANCOCIN) IVPB 1000 mg/200 mL premix  Status:  Discontinued        1,000 mg 200 mL/hr over 60 Minutes Intravenous Every 24 hours 12/23/11 0843 12/25/11 0958   12/23/11 1200   piperacillin-tazobactam (ZOSYN) IVPB 2.25 g  Status:  Discontinued         2.25 g 100 mL/hr over 30 Minutes Intravenous 4 times per day 12/23/11 0910 12/25/11 0958   12/23/11 0915   Levofloxacin (LEVAQUIN) IVPB 250 mg  Status:  Discontinued        250 mg 50 mL/hr over 60 Minutes Intravenous Every 24 hours 12/23/11 0905 12/25/11 1034   12/22/11 1300   piperacillin-tazobactam (ZOSYN) IVPB 2.25 g  Status:  Discontinued        2.25 g 100 mL/hr over 30 Minutes Intravenous Every 8 hours 12/22/11 1233 12/23/11 0910   12/21/11 2359   vancomycin (VANCOCIN) 1,250 mg in sodium chloride 0.9 % 250 mL IVPB  Status:  Discontinued        1,250 mg 166.7 mL/hr over 90 Minutes Intravenous Every 12 hours 12/21/11 1532 12/22/11 0722   12/21/11 1330   vancomycin (VANCOCIN) IVPB 1000 mg/200  mL premix  Status:  Discontinued        1,000 mg 200 mL/hr over 60 Minutes Intravenous Every 8 hours 12/21/11 1225 12/21/11 1532   12/19/11 1330   vancomycin (VANCOCIN) IVPB 1000 mg/200 mL premix  Status:  Discontinued        1,000 mg 200 mL/hr over 60 Minutes Intravenous Every 8 hours 12/19/11 1302 12/21/11 1107   12/19/11 1315   piperacillin-tazobactam (ZOSYN) IVPB 3.375 g  Status:  Discontinued        3.375 g 12.5 mL/hr over 240 Minutes Intravenous Every 8 hours 12/19/11 1302 12/22/11 1233         Assessment: 45 yom admitted for bilateral tibial fracture s/p syncope (was stable after surgery 4/19), but then transferred to ICU with s/sx's of septic shock and NSTEMI. Pt previously on vancomycin and Zosyn, narrowed to levaquin. Pt continues to have fevers and potential new worsening infiltrate. Pharmacy consulted to re-broaden antibiotics to vancomycin and fortaz on 4/29. Pt renal fx is poor and has been converted from CVVHD to intermittent hemodialysis. Pt tolerated 4 hrs of dialysis on 4/28 and 4/29 with BFR @ 200-300 ml/min. Pt received Vancomycin Loading dose yesterday. Today pt scheduled for HD. WBC continues to increase to to 20.4. Remains febrile with a tmax of 101.  Abx:  4/24  levo >> 5/1 (stop date entered) 4/20 zosyn >>4/26 4/20 vanc >> (10mg /kg q24 on CRRT) 4/26 restart 4/29>>  4/29 fortaz  Cultures: MRSA (-) 4/29 repeat resp cx:  4/29 repeat urine 4/29 repeat bld x 2 4/23 blood: pending 4/23 urine: negative 4/20 trach aspirate: NPF 4/20 blood: ngtd   Goal of Therapy:  Pre-HD vancomycin level 15-25 mcg/ml  Plan:  1) Vancomycin 1 gram IV  x 1 today with HD 2) F/U HD plans and toleration for additional doses and levels 3) Continue Fortaz  4) Monitor fever and WBC trends and repeat cxs  Janace Litten, PharmD [phone] 12/29/2011,11:15 AM

## 2011-12-29 NOTE — Progress Notes (Signed)
Subjective:  Remains intubated and sedated. Still on milronone and phenylephrine drips for BP support. Still febrile max 101 yesterday.  Objective:  Vital Signs in the last 24 hours: Temp:  [85.5 F (29.7 C)-101 F (38.3 C)] 99.9 F (37.7 C) (04/30 0715) Pulse Rate:  [81-118] 82  (04/30 0715) Resp:  [19-29] 20  (04/30 0715) BP: (87-145)/(42-60) 99/47 mmHg (04/30 0700) SpO2:  [88 %-100 %] 99 % (04/30 0715) Arterial Line BP: (88-133)/(41-61) 102/42 mmHg (04/30 0715) FiO2 (%):  [40 %-50 %] 50 % (04/30 0600) Weight:  [85.9 kg (189 lb 6 oz)-90.5 kg (199 lb 8.3 oz)] 85.9 kg (189 lb 6 oz) (04/30 0256)  Intake/Output from previous day: 04/29 0701 - 04/30 0700 In: 4319.3 [I.V.:2304.3; Blood:500; NG/GT:915; IV Piggyback:600] Out: 5204 [Urine:100] Intake/Output from this shift:       . antiseptic oral rinse  15 mL Mouth Rinse QID  . aspirin  81 mg Per Tube Daily  . cefTAZidime (FORTAZ)  IV  1 g Intravenous Q24H  . chlorhexidine  15 mL Mouth Rinse BID  . feeding supplement  30 mL Per Tube QID  . heparin  5,000 Units Subcutaneous Q8H  . insulin aspart  0-15 Units Subcutaneous Q4H  . insulin aspart  3 Units Subcutaneous Q4H  . insulin glargine  32 Units Subcutaneous Daily  . levofloxacin (LEVAQUIN) IV  250 mg Intravenous Q24H  . pantoprazole sodium  40 mg Per Tube Q1200  . potassium chloride  40 mEq Per Tube Daily  . sodium chloride  3 mL Intravenous Q12H  . vancomycin  2,000 mg Intravenous Once  . DISCONTD: feeding supplement  30 mL Per Tube 5 X Daily  . DISCONTD: insulin glargine  32 Units Subcutaneous Daily  . DISCONTD: pantoprazole (PROTONIX) IV  40 mg Intravenous Q24H      . sodium chloride 20 mL/hr at 12/27/11 2000  . dextrose    . feeding supplement (OSMOLITE 1.5 CAL) 1,000 mL (12/28/11 1646)  . fentaNYL infusion INTRAVENOUS 200 mcg/hr (12/29/11 0600)  . midazolam (VERSED) infusion 8 mg/hr (12/29/11 0600)  . milrinone 0.125 mcg/kg/min (12/29/11 0600)  . norepinephrine  (LEVOPHED) Adult infusion Stopped (12/27/11 0400)  . phenylephrine (NEO-SYNEPHRINE) Adult infusion 150 mcg/min (12/29/11 0730)  . DISCONTD: feeding supplement (JEVITY 1.2 CAL) 15 mL/hr at 12/27/11 1454  . DISCONTD: dialysate (PRISMASATE) 2,000 mL/hr at 12/27/11 1215    Physical Exam: The patient is intubated and sedated. Chest: clearer anteriorly  Heart: No murmur gallop or rub. Abdomen: No apparent tenderness Extremities:Mild pedal edema.  Lab Results:  Basename 12/29/11 0500 12/28/11 0438  WBC 20.4* 17.6*  HGB 8.4* 7.2*  PLT 363 383    Basename 12/29/11 0500 12/28/11 0438  NA 134* 135  K 4.1 3.5  CL 98 98  CO2 26 27  GLUCOSE 240* 157*  BUN 30* 32*  CREATININE 3.25* 3.54*   No results found for this basename: TROPONINI:2,CK,MB:2 in the last 72 hours Hepatic Function Panel  Basename 12/29/11 0500 12/27/11 0430  PROT -- 5.6*  ALBUMIN 2.1* --  AST -- 25  ALT -- 22  ALKPHOS -- 127*  BILITOT -- 0.4  BILIDIR -- --  IBILI -- --   No results found for this basename: CHOL in the last 72 hours No results found for this basename: PROTIME in the last 72 hours  Imaging: Imaging results have been reviewed. Chest xray shows mild CM and persistent bilateral infiltrates and mild congestion/edema Cardiac Studies:  Assessment/Plan:  Patient Active Hospital  Problem List: Hypotension (12/19/2011)   Assessment: Still requiring phenylephrine and milronone   Plan: Continue pressor support.  NSTEMI (non-ST elevated myocardial infarction) (12/19/2011)   Assessment: Rhythm stable NSR   Plan: EKG yesterday stable.  No acute ischemic changes.  Poor R wave progression anterior leads.  2D echo results pending. Acute blood loss anemia (12/19/2011)   Assessment: Hgb improved to 8.4 this am  Plan:  Continue support. Wean pressors as possible depending on underlying BP    LOS: 11 days    Cassell Clement 12/29/2011, 8:15 AM

## 2011-12-29 NOTE — Progress Notes (Signed)
Name: Reginald Gutierrez MRN: [id number] DOB: 1966/02/08    LOS: 11  PCCM PROGRESS NOTE  History of Present Illness: 46 y/o with history of DM and cocaine abuse admitted with bilateral tibia fracture s/p syncopal episode on 4/19.  Postoperatively he developed acute hypoxia and refractory shock, transient ST depression and troponin leak.  Course is complicated by ARDS requiring HFOV and acute renal failure requiring CVVHD.  Lines / Drains: 4/20 OETT >>> 4/20  OGT >>> 4/20 R IJ CVL >>> 4/20 R fem A-line >>> 4/23 L IJ DH >>>   Cultures: 4/20  Blood >>>negative 4/20  Sputum >>>negative 4/23  Blood >>> negative 4/23  Urine >>>negative 4/29 Sputum>>> 4/29 Urine>>> 4/29 Blood>>>  Abx: Zosyn 4/20 (empirical, G-, anaerobes) >>>4/26 Vanc 4/20 (empirical, MRSA) >>>4/26 Vanc restart 4/29>>> Levaquin 4/24 (empirical, atypical) >>> Ceftazidime Elita Quick) 4/29>>>  Tests / Events: 4/19  Admission, repair of bilat tibia Fx 4/20  Refractory shock, hypoxemic resp failure, NSTEMI.  Intubated 4/20  CT head >>> NAD 4/20  TTE >>> EF 25 to 30%, akinesis of mid/distal anteroseptal and apical area 4/21  Chest CT angio >>> No central pulmonary embolism. Slight progression of right greater than left airspace disease. 4/23  Renal US >>> no hydronephrosis, no urine obstruction 4/23  HFOV due to refractory hypoxemia.  Vasopressors.  CVVHD. 4/24  Off HFOV 4/29 transfused 1 U blood  Subjective/interval:  -4/29 Completed HD - 4/29 Changed L IJ CVL  Vital Signs: Temp:  [85.5 F (29.7 C)-101 F (38.3 C)] 100 F (37.8 C) (04/30 0500) Pulse Rate:  [84-118] 88  (04/30 0500) Resp:  [19-29] 20  (04/30 0500) BP: (87-145)/(42-60) 122/57 mmHg (04/30 0500) SpO2:  [88 %-100 %] 100 % (04/30 0500) Arterial Line BP: (88-133)/(41-61) 132/58 mmHg (04/30 0500) FiO2 (%):  [40 %-50 %] 50 % (04/30 0500) Weight:  [189 lb 6 oz (85.9 kg)-199 lb 8.3 oz (90.5 kg)] 189 lb 6 oz (85.9 kg) (04/30 0256) I/O last 3 completed  shifts: In: 6206 [I.V.:3916; Blood:500; NG/GT:890; IV Piggyback:900] Out: 4945 [Urine:55; WUJWJ:1914; Stool:150]  Physical Examination: General:  Intubated, mechanically ventilated, no acute distress Neuro:  Sedated, synchronous, nonfocal HEENT:  PERRL, pink conjunctivae, moist membranes Neck:  Supple, no JVD   Cardiovascular:  RRR, no M/G/R Lungs: rhonchi and rales bilaterally, worse on the right side. Abdomen:  distended, decreased BS.  Musculoskeletal: B LE injuries.  Skin:  No rash  Ventilator settings: Vent Mode:  [-] PRVC FiO2 (%):  [40 %-50 %] 50 % Set Rate:  [20 bmp] 20 bmp Vt Set:  [520 mL] 520 mL PEEP:  [5 cmH20-10 cmH20] 10 cmH20 Plateau Pressure:  [18 cmH20-21 cmH20] 20 cmH20  Labs and Imaging:  Reviewed.  Please refer to the Assessment and Plan section for relevant results.  ASSESSMENT AND PLAN  NEUROLOGIC A:  Acute encephalopathy (metabolic, medications, anoxia, hypoglycemia).  History of Cocaine abuse (clean 5-10 years). P: -->  Versed / Fentanyl gtt to goal RASS 0 to -1 -->  daily WUA/SBT  PULMONARY  Lab 12/27/11 1253 12/27/11 0430 12/25/11 0513 12/24/11 0249 12/23/11 2014  PHART 7.441 7.391 7.254* 7.267* 7.319*  PCO2ART 45.4* 45.3* 66.0* 67.4* 63.0*  PO2ART 68.0* 85.0 64.0* 64.0* 106.0*  HCO3 30.5* 27.5* 29.8* 30.8* 32.8*  O2SAT 93.0 96.0 90.0 88.0 98.0   4/30  CXR: There is be slightly decreased edema or interstitial disease.  A:   Acute hypoxemic / hypercarbic respiratory failure.  Pulmoanry edema more likely as baseline decreased EF and  acute MI.  Less likely ARDS. P:  -->  Ventilation -->  Goal pH > 7.25, SpO2 > 88, FiO2 < 0.5 -->  Daily WUA/SBT  CARDIOVASCULAR  Lab 12/28/11 0438 12/23/11 0418  TROPONINI -- --  LATICACIDVEN -- --  PROBNP 32116.0* 16645.0*   - 4/20  ECHO EF 25%, some regional wall motion abnormalities. - ProBNP trending up from 16645 on 4/24 to 32116 on 4/29 - repeated 2 D echo,  EF 40% to 45%, but patient is on  Milrinone.  A:  NSTEMI, likely intraoperative (on the background of poorly controlled DM).  Acute cardiomyopathy / congestive heart failure.  Cardiogenic shock, less likely septic.   -->  Continue ASA,  -->  Heparin completed -->  Hold Metoprolol as in shock -->  d/c Milrinone if Bp is OK -->  Neo-Synephrine (1st choice) to keep MAP > 60  RENAL  Lab 12/28/11 0438 12/27/11 1027 12/27/11 1009 12/27/11 0629 12/27/11 0617 12/27/11 0430 12/26/11 1600 12/26/11 0500 12/25/11 1725 12/25/11 0439 12/24/11 0246 12/23/11 0417  NA 135 135 137 138 134* -- -- -- -- -- -- --  K 3.5 3.5 -- -- -- -- -- -- -- -- -- --  CL 98 98 96 96 98 -- -- -- -- -- -- --  CO2 27 -- -- -- -- 26 22 25 26  -- -- --  BUN 32* 30* 25* 26* 33* -- -- -- -- -- -- --  CREATININE 3.54* 3.10* 2.60* 2.40* 3.10* -- -- -- -- -- -- --  CALCIUM 8.6 -- -- -- -- 8.2* 8.0* 8.2* 8.3* -- -- --  MG -- -- -- -- -- 2.4 -- 2.6* -- 2.6* 2.3 2.2  PHOS 2.5 -- -- -- -- 1.8* 1.8* 2.4 2.4 -- -- --   I/O last 3 completed shifts: In: 6038.2 [I.V.:5378.2; NG/GT:310; IV Piggyback:350] Out: 4565 [Urine:25; Emesis/NG output:600; Other:3910; Stool:30]  A:  Acute renal failure / AKI.   P: -->  Continue HD, renal following -->  CVP checks to qshift, goal < 4 if BP tolerates -->  BMP in AM  GASTROINTESTINAL  Lab 12/28/11 0438 12/27/11 0430 12/26/11 1600 12/26/11 0500 12/25/11 1725 12/23/11 0418  AST -- 25 -- -- -- 52*  ALT -- 22 -- -- -- 50  ALKPHOS -- 127* -- -- -- 117  BILITOT -- 0.4 -- -- -- 0.5  PROT -- 5.6* -- -- -- 4.5*  ALBUMIN 1.9* 1.9* 1.8* 1.9* 1.9* --   A:  Shocked liver.  Ileus.  Malnutrition.  P: --> TF  --> GI Px  HEMATOLOGIC  Lab 12/29/11 0500 12/28/11 0438 12/27/11 1027 12/27/11 1009 12/27/11 0629 12/27/11 0430 12/25/11 0439 12/24/11 1712 12/24/11 0246 12/23/11 0418  HGB 8.4* 7.2* 7.8* 8.8* 8.5* -- -- -- -- --  HCT 25.7* 22.4* 23.0* 26.0* 25.0* -- -- -- -- --  PLT 363 383 -- -- -- 379 379 326 -- --  INR -- -- -- -- -- --  -- -- -- --  APTT -- -- -- -- -- -- 85* -- 69* 45*   A: Acute anemia.  No overt hemorrhage.  P: --> CBC an AM --> Goal Hb > 8, given AMI  INFECTIOUS  Lab 12/29/11 0500 12/28/11 0438 12/27/11 0430 12/25/11 1030 12/25/11 0439 12/24/11 1712 12/23/11 0418  WBC 20.4* 17.6* 17.3* -- 21.0* 17.8* --  PROCALCITON -- -- -- 7.08 -- -- 9.54   A:  Suspected pneumonia. Concerning for ventilator associated PNA.  P: --> Continue Levaquin,Vancomycin,  ceftazidime  ENDOCRINE  Lab 12/29/11 0411 12/28/11 2330 12/28/11 2003 12/28/11 1646 12/28/11 1228  GLUCAP 198* 248* 187* 295* 286*   TSH 3.746, Cortisol 30.4  A:  IDDM, hyperglycemia. History of hypothyroidism, but low TSH on admission. P: -->  SSI/CBG --> lantus to 32u  --> add Novolog 3 U q4h to cover TF  MUSCULOSKELETAL A:  Tibia fracture P:  Per Ortho  BEST PRACTICE / DISPOSITION -->  ICU status under PCCM -->  Cardiology, Nephrology and Ortho consulting -->  Full code -->  TF -->  Heparin for DVT Px -->  Protonix IV for GI Px -->  Family not available am 4/28    Lorretta Harp, MD PGY1, Internal Medicine Teaching Service Pager: [phone]  Reviewed above, examined pt, and agree with assessment/plan.  Will attempt to wean off milrinone and then phenylephrine as tolerated.  Not ready for vent weaning.  Continue current Abx for pneumonia.  CRRT per renal.   Updated mother about current plan.  Critical care time 35 minutes.  Coralyn Helling, MD 12/29/2011, 11:49 AM Pager:  [phone]

## 2011-12-29 NOTE — Progress Notes (Signed)
Clinical Social Worker phoned pt's mom and provided emotional support.  Mom shared that she is hopeful for pt's recovery and no needs/concerns were identified at this time.  Mom did mention that she would try to find copies of "The Lord's Prayer" to play in pt's room as he continues to come off of sedation.    Angelia Mould, MSW, [place] [phone]

## 2011-12-29 NOTE — Progress Notes (Signed)
Patient ID: Reginald Gutierrez, male   DOB: 03-02-1966, 46 y.o.   MRN: [id number]   [facility] Progress Note    Subjective:   Successful HD with 5L ultrafiltration following catheter exchange yesterday. Secretions/BP lower but FiO2 needs higher.   Objective:   BP 106/53  Pulse 85  Temp(Src) 99.9 F (37.7 C) (Core (Comment))  Resp 20  Ht 5\' 6"  (1.676 m)  Wt 85.9 kg (189 lb 6 oz)  BMI 30.57 kg/m2  SpO2 99%  Intake/Output Summary (Last 24 hours) at 12/29/11 4098 Last data filed at 12/29/11 0600  Gross per 24 hour  Intake 4319.28 ml  Output   5204 ml  Net -884.72 ml   Weight change: 2.3 kg (5 lb 1.1 oz)  Physical Exam: JXB:[id number]/[id number]  QIO:NGEXB RRR, normal S1 and S2  Resp:Coarse BS bilaterally, no rales/rhonchi MWU:XLKG, distended, tympanitic, non-tender Ext:1+-2+ edema with LE dressings  Imaging: Dg Chest Port 1 View  12/28/2011  *RADIOLOGY REPORT*  Clinical Data: Hemodialysis catheter insertion  PORTABLE CHEST - 1 VIEW  Comparison: Portable exam 1431 hours compared to 0527 hours  Findings: Endotracheal tube, nasogastric tube, and right jugular line unchanged. New left jugular line, tip projecting over SVC. Heart remains enlarged with pulmonary vascular congestion. Diffuse pulmonary infiltrates likely edema, little changed. No pneumothorax. Bones unremarkable.  IMPRESSION: Tip of left jugular line projects over SVC. Persistent diffuse infiltrates/edema, unchanged.  Original Report Authenticated By: Lollie Marrow, M.D.   Dg Chest Port 1 View  12/28/2011  *RADIOLOGY REPORT*  Clinical Data: Pneumonia  PORTABLE CHEST - 1 VIEW  Comparison: 12/27/2011  Findings: Cardiomediastinal silhouette is stable.  Endotracheal tube in place with tip 3 cm above the carina.  Stable NG tube position.  Stable right IJ central line position.  Persistent mild congestion/edema.  Stable bilateral pleural effusions with bilateral basilar atelectasis or infiltrate. Left IJ catheter sheath  is unchanged in position.  IMPRESSION: Stable support apparatus.  Stable mild congestion/edema. Persistent bilateral pleural effusion with bilateral basilar atelectasis or infiltrate.  Original Report Authenticated By: Natasha Mead, M.D.    Labs: BMET  Lab 12/29/11 0500 12/28/11 4010 12/27/11 1027 12/27/11 1009 12/27/11 0629 12/27/11 0617 12/27/11 0430 12/26/11 1600 12/26/11 0500 12/25/11 1725 12/25/11 0439  NA 134* 135 135 137 138 134* 132* -- -- -- --  K 4.1 3.5 3.5 3.5 3.4* 3.4* 3.2* -- -- -- --  CL 98 98 98 96 96 98 94* -- -- -- --  CO2 26 27 -- -- -- -- 26 22 25 26 28   GLUCOSE 240* 157* 218* 259* 253* 253* 276* -- -- -- --  BUN 30* 32* 30* 25* 26* 33* 33* -- -- -- --  CREATININE 3.25* 3.54* 3.10* 2.60* 2.40* 3.10* 3.22* -- -- -- --  ALB -- -- -- -- -- -- -- -- -- -- --  CALCIUM 8.4 8.6 -- -- -- -- 8.2* 8.0* 8.2* 8.3* 8.5  PHOS 2.9 2.5 -- -- -- -- 1.8* 1.8* 2.4 2.4 4.4   CBC  Lab 12/29/11 0500 12/28/11 0438 12/27/11 1027 12/27/11 1009 12/27/11 0430 12/25/11 0439  WBC 20.4* 17.6* -- -- 17.3* 21.0*  NEUTROABS -- -- -- -- -- --  HGB 8.4* 7.2* 7.8* 8.8* -- --  HCT 25.7* 22.4* 23.0* 26.0* -- --  MCV 87.7 88.5 -- -- 88.3 88.4  PLT 363 383 -- -- 379 379    Medications:      . antiseptic oral rinse  15 mL Mouth Rinse QID  .  aspirin  81 mg Per Tube Daily  . cefTAZidime (FORTAZ)  IV  1 g Intravenous Q24H  . chlorhexidine  15 mL Mouth Rinse BID  . feeding supplement  30 mL Per Tube QID  . heparin  5,000 Units Subcutaneous Q8H  . insulin aspart  0-15 Units Subcutaneous Q4H  . insulin aspart  3 Units Subcutaneous Q4H  . insulin glargine  32 Units Subcutaneous Daily  . levofloxacin (LEVAQUIN) IV  250 mg Intravenous Q24H  . pantoprazole sodium  40 mg Per Tube Q1200  . potassium chloride  40 mEq Per Tube Daily  . sodium chloride  3 mL Intravenous Q12H  . vancomycin  2,000 mg Intravenous Once  . DISCONTD: feeding supplement  30 mL Per Tube 5 X Daily  . DISCONTD: insulin glargine  32  Units Subcutaneous Daily  . DISCONTD: pantoprazole (PROTONIX) IV  40 mg Intravenous Q24H     Assessment/ Plan:   1. AKI due to ATN from combination of IV dye and shock-Tolerated hemodialysis with 5L volume removal and will re-attempt HD again today- large fluid input from drips/TFs (Ultrafiltration goal may be limited by hypotension). Overall prognosis remain poor with MOFS.  2. Volume excess/pulm edema- CXR/oxygenation unchanged with ultrafiltration overnight- reassess after today's treatment (If able to tolerate). Recommend reducing TF rate or using calorie-dense feeds at lower volumes to minimize volume loads. 3. Acute coronary syndrome with positive CE's and wall motion abnormalities on ECHO, EF 25%- Plans per cardiology 4. TYPE I DM- on lantus/novolog 5. Aspiration pneumonia with VDRF on full support- on levaquin 6. S/p fall with bilat LE tibial fracture- S/P IM pinning- Per ortho  Zetta Bills, MD 12/29/2011, 7:27 AM

## 2011-12-30 ENCOUNTER — Encounter (HOSPITAL_COMMUNITY): Payer: Self-pay | Admitting: Specialist

## 2011-12-30 ENCOUNTER — Inpatient Hospital Stay (HOSPITAL_COMMUNITY): Payer: Medicare Other

## 2011-12-30 LAB — CULTURE, RESPIRATORY W GRAM STAIN: Culture: NO GROWTH

## 2011-12-30 LAB — CBC
HCT: 25.4 % — ABNORMAL LOW (ref 39.0–52.0)
Hemoglobin: 8.1 g/dL — ABNORMAL LOW (ref 13.0–17.0)
MCH: 28.2 pg (ref 26.0–34.0)
MCHC: 31.9 g/dL (ref 30.0–36.0)
RBC: 2.87 MIL/uL — ABNORMAL LOW (ref 4.22–5.81)

## 2011-12-30 LAB — GLUCOSE, CAPILLARY
Glucose-Capillary: 149 mg/dL — ABNORMAL HIGH (ref 70–99)
Glucose-Capillary: 242 mg/dL — ABNORMAL HIGH (ref 70–99)
Glucose-Capillary: 278 mg/dL — ABNORMAL HIGH (ref 70–99)

## 2011-12-30 LAB — HEPATIC FUNCTION PANEL
ALT: 32 U/L (ref 0–53)
Albumin: 2 g/dL — ABNORMAL LOW (ref 3.5–5.2)
Total Protein: 5.6 g/dL — ABNORMAL LOW (ref 6.0–8.3)

## 2011-12-30 LAB — POCT I-STAT 3, ART BLOOD GAS (G3+)
O2 Saturation: 94 %
Patient temperature: 99.7
TCO2: 29 mmol/L (ref 0–100)
pCO2 arterial: 40.3 mmHg (ref 35.0–45.0)
pH, Arterial: 7.449 (ref 7.350–7.450)

## 2011-12-30 LAB — RENAL FUNCTION PANEL
CO2: 26 mEq/L (ref 19–32)
Calcium: 8.7 mg/dL (ref 8.4–10.5)
Chloride: 98 mEq/L (ref 96–112)
Creatinine, Ser: 3.84 mg/dL — ABNORMAL HIGH (ref 0.50–1.35)
GFR calc non Af Amer: 18 mL/min — ABNORMAL LOW (ref >90)
Glucose, Bld: 145 mg/dL — ABNORMAL HIGH (ref 70–99)

## 2011-12-30 MED ORDER — HEPARIN SODIUM (PORCINE) 1000 UNIT/ML DIALYSIS: 20.0000 [IU]/kg | INTRAMUSCULAR | Status: DC | PRN

## 2011-12-30 MED ORDER — LEVOFLOXACIN IN D5W 250 MG/50ML IV SOLN
250.0000 mg | INTRAVENOUS | Status: AC
  Administered 2011-12-30 – 2011-12-31 (×2): 250 mg via INTRAVENOUS
  Filled 2011-12-30 (×3): qty 50

## 2011-12-30 MED ORDER — ALTEPLASE 100 MG IV SOLR
2.0000 mg | Freq: Once | INTRAVENOUS | Status: AC
  Administered 2011-12-30: 2 mg
  Filled 2011-12-30: qty 2

## 2011-12-30 MED ORDER — VANCOMYCIN HCL IN DEXTROSE 1-5 GM/200ML-% IV SOLN
1000.0000 mg | INTRAVENOUS | Status: DC | PRN
  Filled 2011-12-30: qty 200

## 2011-12-30 MED ORDER — OSMOLITE 1.5 CAL PO LIQD
1000.0000 mL | ORAL | Status: DC
  Administered 2011-12-30: 1000 mL

## 2011-12-30 MED ORDER — ALTEPLASE 100 MG IV SOLR
2.0000 mg | Freq: Once | INTRAVENOUS | Status: AC
  Administered 2011-12-30: 2 mg
  Filled 2011-12-30 (×2): qty 2

## 2011-12-30 NOTE — Progress Notes (Signed)
Subjective:  Still on vent. Overall slow improvement.  Fever curve improving. Need for pressors is improving.  Now off milronone.  2D echo while on milronone showed improved EF now 40-45%. Rhythm sinus tachy 105/min  Objective:  Vital Signs in the last 24 hours: Temp:  [98.2 F (36.8 C)-100.9 F (38.3 C)] 99.5 F (37.5 C) (05/01 0900) Pulse Rate:  [89-134] 105  (05/01 0900) Resp:  [16-33] 17  (05/01 0900) BP: (96-171)/(43-65) 120/55 mmHg (05/01 0900) SpO2:  [82 %-100 %] 97 % (05/01 0900) Arterial Line BP: (90-148)/(42-70) 125/48 mmHg (05/01 0900) FiO2 (%):  [30 %] 30 % (05/01 0839) Weight:  [84.1 kg (185 lb 6.5 oz)-86.1 kg (189 lb 13.1 oz)] 84.1 kg (185 lb 6.5 oz) (04/30 1850)  Intake/Output from previous day: 04/30 0701 - 05/01 0700 In: 3208.9 [I.V.:1808.9; NG/GT:1300; IV Piggyback:100] Out: 3560 [Urine:58; Stool:2] Intake/Output from this shift: Total I/O In: 324.6 [I.V.:74.6; NG/GT:150; IV Piggyback:100] Out: -      . antiseptic oral rinse  15 mL Mouth Rinse QID  . aspirin  81 mg Per Tube Daily  . cefTAZidime (FORTAZ)  IV  1 g Intravenous Q24H  . chlorhexidine  15 mL Mouth Rinse BID  . feeding supplement (OSMOLITE 1.5 CAL)  1,000 mL Per Tube Q24H  . feeding supplement  30 mL Per Tube QID  . heparin  5,000 Units Subcutaneous Q8H  . insulin aspart  0-15 Units Subcutaneous Q4H  . insulin aspart  3 Units Subcutaneous Q4H  . insulin glargine  32 Units Subcutaneous Daily  . levofloxacin (LEVAQUIN) IV  250 mg Intravenous Q24H  . levothyroxine  50 mcg Oral QAC breakfast  . pantoprazole sodium  40 mg Per Tube Q1200  . potassium chloride  40 mEq Per Tube Daily  . sodium chloride  3 mL Intravenous Q12H  . DISCONTD: levofloxacin (LEVAQUIN) IV  250 mg Intravenous Q24H      . sodium chloride 20 mL/hr at 12/27/11 2000  . fentaNYL infusion INTRAVENOUS 100 mcg/hr (12/30/11 0900)  . midazolam (VERSED) infusion 8 mg/hr (12/30/11 0900)  . phenylephrine (NEO-SYNEPHRINE) Adult  infusion 30.133 mcg/min (12/30/11 0900)  . DISCONTD: dextrose    . DISCONTD: feeding supplement (OSMOLITE 1.5 CAL) 1,000 mL (12/28/11 1646)  . DISCONTD: milrinone Stopped (12/29/11 1020)  . DISCONTD: norepinephrine (LEVOPHED) Adult infusion Stopped (12/27/11 0400)    Physical Exam: The patient is intubated and sedated. Chest: clearer anteriorly  Heart: No murmur gallop or rub. Abdomen: No apparent tenderness Extremities:Mild pedal edema.  Lab Results:  Basename 12/30/11 0500 12/29/11 0500  WBC 14.7* 20.4*  HGB 8.1* 8.4*  PLT 326 363    Basename 12/30/11 0500 12/29/11 0500  NA 133* 134*  K 3.6 4.1  CL 98 98  CO2 26 26  GLUCOSE 145* 240*  BUN 37* 30*  CREATININE 3.84* 3.25*   No results found for this basename: TROPONINI:2,CK,MB:2 in the last 72 hours Hepatic Function Panel  Basename 12/30/11 0500  PROT --  ALBUMIN 2.1*  AST --  ALT --  ALKPHOS --  BILITOT --  BILIDIR --  IBILI --   No results found for this basename: CHOL in the last 72 hours No results found for this basename: PROTIME in the last 72 hours  Imaging: Imaging results have been reviewed. Chest xray shows mild CM and persistent bilateral infiltrates and mild congestion/edema Cardiac Studies: [facility]* *[facility]* [address] [place], [place] [address] [phone]  ------------------------------------------------------------ Transthoracic Echocardiography  Patient: Reginald, Gutierrez MR #: [id number] Study Date: 12/28/2011 Gender: M Age: 46 Height: 167.6cm Weight: 88.8kg BSA: 2.1m^2 Pt. Status: Room: 2102  Reginald Ny, MD PERFORMING [facility] ATTENDING [place], [place] SONOGRAPHER Elizabeth Lake, RCS ADMITTING Rory Percy cc:  ------------------------------------------------------------ LV EF: 40% - 45%  ------------------------------------------------------------ Indications: Acute respiratory distress  518.82.  ------------------------------------------------------------ History: PMH: Myocardial infarction. Risk factors: Current tobacco use. Hypertension. Diabetes mellitus. Dyslipidemia.  ------------------------------------------------------------ Study Conclusions  - Left ventricle: The cavity size was normal. Wall thickness was normal. Systolic function was mildly to moderately reduced. The estimated ejection fraction was in the range of 40% to 45%. There is hypokinesis of the mid-distal inferoposterior and apical myocardium. - Mitral valve: Mild regurgitation. - Left atrium: The atrium was mildly dilated. Transthoracic echocardiography. M-mode, complete 2D, spectral Doppler, and color Doppler. Height  Assessment/Plan:  Patient Active Hospital Problem List: Hypotension (12/19/2011)   Assessment: Still requiring phenylephrine.   Plan: Continue pressor support.  NSTEMI (non-ST elevated myocardial infarction) (12/19/2011)   Assessment: Rhythm stable NSR   Plan: EKG yesterday stable.  No acute ischemic changes.  Poor R wave progression anterior leads.  2D echo results as noted above Acute blood loss anemia (12/19/2011)   Assessment: Hgb 8.1  Plan:  Continue support. Wean pressors as possible depending on underlying BP           Was on lipitor at home.  Lipid levels on 12/20/11 were very low. Will recheck lipid levels and LFTs and consider restarting low dose lipitor for membrane stabilization. I spoke with his mother in the room today.    LOS: 12 days    Cassell Clement 12/30/2011, 10:06 AM

## 2011-12-30 NOTE — Progress Notes (Signed)
Nutrition Follow-up / Consult  Patient remains on ventilator, receiving HD for fluid removal.  Received consult to change TF to limit fluid intake.  RD changed TF on 4/29 to a calorically dense/concentrated TF formula.    Diet Order:  Osmolite 1.5 (via OG tube) at 45 ml/h with Prostat 30 ml QID providing 2020 kcals, 128 grams protein, 823 ml free water daily.  Meds: Scheduled Meds:   . antiseptic oral rinse  15 mL Mouth Rinse QID  . aspirin  81 mg Per Tube Daily  . cefTAZidime (FORTAZ)  IV  1 g Intravenous Q24H  . chlorhexidine  15 mL Mouth Rinse BID  . feeding supplement (OSMOLITE 1.5 CAL)  1,000 mL Per Tube Q24H  . feeding supplement  30 mL Per Tube QID  . heparin  5,000 Units Subcutaneous Q8H  . insulin aspart  0-15 Units Subcutaneous Q4H  . insulin aspart  3 Units Subcutaneous Q4H  . insulin glargine  32 Units Subcutaneous Daily  . levofloxacin (LEVAQUIN) IV  250 mg Intravenous Q24H  . levothyroxine  50 mcg Oral QAC breakfast  . pantoprazole sodium  40 mg Per Tube Q1200  . potassium chloride  40 mEq Per Tube Daily  . sodium chloride  3 mL Intravenous Q12H  . DISCONTD: levofloxacin (LEVAQUIN) IV  250 mg Intravenous Q24H   Continuous Infusions:   . sodium chloride 20 mL/hr at 12/27/11 2000  . fentaNYL infusion INTRAVENOUS 100 mcg/hr (12/30/11 0900)  . midazolam (VERSED) infusion 8 mg/hr (12/30/11 0900)  . phenylephrine (NEO-SYNEPHRINE) Adult infusion 30.133 mcg/min (12/30/11 0900)  . DISCONTD: dextrose    . DISCONTD: feeding supplement (OSMOLITE 1.5 CAL) 1,000 mL (12/28/11 1646)  . DISCONTD: milrinone Stopped (12/29/11 1020)  . DISCONTD: norepinephrine (LEVOPHED) Adult infusion Stopped (12/27/11 0400)   PRN Meds:.acetaminophen (TYLENOL) oral liquid 160 mg/5 mL, fentaNYL, heparin, heparin, midazolam, ondansetron (ZOFRAN) IV, vancomycin, vancomycin, DISCONTD: artificial tears  Labs:  CMP     Component Value Date/Time   NA 133* 12/30/2011 0500   K 3.6 12/30/2011 0500   CL 98  12/30/2011 0500   CO2 26 12/30/2011 0500   GLUCOSE 145* 12/30/2011 0500   BUN 37* 12/30/2011 0500   CREATININE 3.84* 12/30/2011 0500   CALCIUM 8.7 12/30/2011 0500   PROT 5.6* 12/27/2011 0430   ALBUMIN 2.1* 12/30/2011 0500   AST 25 12/27/2011 0430   ALT 22 12/27/2011 0430   ALKPHOS 127* 12/27/2011 0430   BILITOT 0.4 12/27/2011 0430   GFRNONAA 18* 12/30/2011 0500   GFRAA 20* 12/30/2011 0500     Intake/Output Summary (Last 24 hours) at 12/30/11 1046 Last data filed at 12/30/11 0900  Gross per 24 hour  Intake   3033 ml  Output   3539 ml  Net   -506 ml    Weight Status:  84.1 kg (continues to trend down with fluid removal)  Re-estimated needs:  2000-2100 kcals, 100-120 grams protein daily  Nutrition Dx:  Inadequate oral intake, ongoing.  Goal:  TF to meet 90-100% estimated needs, met.  Intervention:  Decrease Osmolite 1.5 to 40 ml/h with Prostat 30 ml QID to provide 960 ml total fluid, 1840 kcals (92% of minimum estimated needs), 120 grams protein, 732 ml free water daily.  Monitor:  TF tolerance, fluid status, labs, weight trend.   Hettie Holstein Pager #:  [phone]

## 2011-12-30 NOTE — Progress Notes (Signed)
Patient ID: Reginald Gutierrez, male   DOB: 18-Aug-1966, 46 y.o.   MRN: [id number]   [facility] Progress Note    Subjective:   No acute events overnight except for persisting fevers. Remains on Phenylephrine for BP support- tolerated HD/UF   Objective:   BP 117/51  Pulse 100  Temp(Src) 99.6 F (37.6 C) (Core (Comment))  Resp 20  Ht 5\' 6"  (1.676 m)  Wt 84.1 kg (185 lb 6.5 oz)  BMI 29.93 kg/m2  SpO2 95%  Intake/Output Summary (Last 24 hours) at 12/30/11 0731 Last data filed at 12/30/11 0600  Gross per 24 hour  Intake 3208.93 ml  Output   3560 ml  Net -351.07 ml   Weight change: -4.4 kg (-9 lb 11.2 oz)  Physical Exam: Gen: Intubated/sedated- diaphoretic HYQ:MVHQI RRR, normal S1 and S2  Resp:Coarse BS, no rales/rhonchi ONG:EXBM, flat, NT, BS normal Ext: 1+ LE edema, 1-2+ UE edema  Imaging: Dg Chest Port 1 View  12/30/2011  *RADIOLOGY REPORT*  Clinical Data: Shortness of breath, intubated  PORTABLE CHEST - 1 VIEW  Comparison: 12/29/2011  Findings: Endotracheal tube tip 2.4 cm proximal to the carina.  NG tube descends below the image then returns proximally, with tip projecting over the proximal stomach.  There are bibasilar/retrocardiac opacities. Probable layering pleural effusions.  No pneumothorax. Unchanged cardiomediastinal contours, upper normal limits.  Central vascular congestion.  Left IJ large bore catheter tip projects over the left brachiocephalic/SVC confluence. Right IJ catheter tip projects over the proximal SVC No acute osseous change.  IMPRESSION: Unchanged support devices, including endotracheal tube 2.4 cm proximal to the carina.  Unchanged pattern of bilateral airspace opacities.  Original Report Authenticated By: Waneta Martins, M.D.   Dg Chest Port 1 View  12/29/2011  *RADIOLOGY REPORT*  Clinical Data: Pneumonia.  PORTABLE CHEST - 1 VIEW  Comparison: 12/28/2011  Findings: Endotracheal tube is approximately 2.6 cm above the carina.  There are  bilateral jugular central venous catheters which are both in the upper SVC region.  A nasogastric tube extends into the abdomen and appears to be coiled in the stomach.  Persistent airspace disease throughout the right mid and lower lung. Persistent consolidation or opacification at the left lung base. There may be decreased interstitial disease in the left lung.  IMPRESSION: Persistent bilateral airspace disease, right side greater than left. There may be slightly decreased edema or interstitial disease.  Original Report Authenticated By: Richarda Overlie, M.D.   Dg Chest Port 1 View  12/28/2011  *RADIOLOGY REPORT*  Clinical Data: Hemodialysis catheter insertion  PORTABLE CHEST - 1 VIEW  Comparison: Portable exam 1431 hours compared to 0527 hours  Findings: Endotracheal tube, nasogastric tube, and right jugular line unchanged. New left jugular line, tip projecting over SVC. Heart remains enlarged with pulmonary vascular congestion. Diffuse pulmonary infiltrates likely edema, little changed. No pneumothorax. Bones unremarkable.  IMPRESSION: Tip of left jugular line projects over SVC. Persistent diffuse infiltrates/edema, unchanged.  Original Report Authenticated By: Lollie Marrow, M.D.    Labs: BMET  Lab 12/30/11 0500 12/29/11 0500 12/28/11 0438 12/27/11 1027 12/27/11 1009 12/27/11 0629 12/27/11 0617 12/27/11 0430 12/26/11 1600 12/26/11 0500 12/25/11 1725  NA 133* 134* 135 135 137 138 134* -- -- -- --  K 3.6 4.1 3.5 3.5 3.5 3.4* 3.4* -- -- -- --  CL 98 98 98 98 96 96 98 -- -- -- --  CO2 26 26 27  -- -- -- -- 26 22 25 26   GLUCOSE 145* 240*  157* 218* 259* 253* 253* -- -- -- --  BUN 37* 30* 32* 30* 25* 26* 33* -- -- -- --  CREATININE 3.84* 3.25* 3.54* 3.10* 2.60* 2.40* 3.10* -- -- -- --  ALB -- -- -- -- -- -- -- -- -- -- --  CALCIUM 8.7 8.4 8.6 -- -- -- -- 8.2* 8.0* 8.2* 8.3*  PHOS 2.9 2.9 2.5 -- -- -- -- 1.8* 1.8* 2.4 2.4   CBC  Lab 12/30/11 0500 12/29/11 0500 12/28/11 0438 12/27/11 1027 12/27/11 0430    WBC 14.7* 20.4* 17.6* -- 17.3*  NEUTROABS -- -- -- -- --  HGB 8.1* 8.4* 7.2* 7.8* --  HCT 25.4* 25.7* 22.4* 23.0* --  MCV 88.5 87.7 88.5 -- 88.3  PLT 326 363 383 -- 379    Medications:      . antiseptic oral rinse  15 mL Mouth Rinse QID  . aspirin  81 mg Per Tube Daily  . cefTAZidime (FORTAZ)  IV  1 g Intravenous Q24H  . chlorhexidine  15 mL Mouth Rinse BID  . feeding supplement (OSMOLITE 1.5 CAL)  1,000 mL Per Tube Q24H  . feeding supplement  30 mL Per Tube QID  . heparin  5,000 Units Subcutaneous Q8H  . insulin aspart  0-15 Units Subcutaneous Q4H  . insulin aspart  3 Units Subcutaneous Q4H  . insulin glargine  32 Units Subcutaneous Daily  . levofloxacin (LEVAQUIN) IV  250 mg Intravenous Q24H  . levofloxacin (LEVAQUIN) IV  250 mg Intravenous Q24H  . levothyroxine  50 mcg Oral QAC breakfast  . pantoprazole sodium  40 mg Per Tube Q1200  . potassium chloride  40 mEq Per Tube Daily  . sodium chloride  3 mL Intravenous Q12H     Assessment/ Plan:   1. AKI due to ATN from combination CINP and shock-Tolerated hemodialysis yesterday in PM with 3.5L volume removal and will re-attempt HD again today- with continued large fluid input from drips/TFs for which UF will be needed to allow improvement in respiratory status. Overall prognosis remain poor with MOFS.  2. Volume excess/pulm edema- CXR/oxygenation unchanged with ultrafiltration overnight- reassess after today's treatment (If able to tolerate). Recommend reducing TF rate or using calorie-dense feeds at lower volumes to minimize volume loads. 3. Acute coronary syndrome with positive CE's and wall motion abnormalities on ECHO, EF 25%- Plans per cardiology 4. TYPE I DM- on lantus/novolog 5. Aspiration pneumonia with VDRF on full support- on levaquin 6. S/p fall with bilat LE tibial fracture- S/P IM pinning- Per ortho  Zetta Bills, MD 12/30/2011, 7:31 AM

## 2011-12-30 NOTE — Progress Notes (Signed)
ANTIBIOTIC CONSULT NOTE - FOLLOW UP  Pharmacy Consult for Vancomycin/Fortaz Indication: pneumonia  No Known Allergies  Patient Measurements: Height: 5\' 6"  (167.6 cm) Weight: 185 lb 6.5 oz (84.1 kg) IBW/kg (Calculated) : 63.8  Vital Signs: Temp: 99.6 F (37.6 C) (05/01 0500) Temp src: Core (Comment) (05/01 0600) BP: 117/51 mmHg (05/01 0600) Pulse Rate: 100  (05/01 0600) Intake/Output from previous day: 04/30 0701 - 05/01 0700 In: 3208.9 [I.V.:1808.9; NG/GT:1300; IV Piggyback:100] Out: 3560 [Urine:58; Stool:2] Intake/Output from this shift:    Labs:  Basename 12/30/11 0500 12/29/11 0500 12/28/11 0438  WBC 14.7* 20.4* 17.6*  HGB 8.1* 8.4* 7.2*  PLT 326 363 383  LABCREA -- -- --  CREATININE 3.84* 3.25* 3.54*   Estimated Creatinine Clearance: 24.7 ml/min (by C-G formula based on Cr of 3.84). No results found for this basename: VANCOTROUGH:2,VANCOPEAK:2,VANCORANDOM:2,GENTTROUGH:2,GENTPEAK:2,GENTRANDOM:2,TOBRATROUGH:2,TOBRAPEAK:2,TOBRARND:2,AMIKACINPEAK:2,AMIKACINTROU:2,AMIKACIN:2, in the last 72 hours   Microbiology: Recent Results (from the past 720 hour(s))  MRSA PCR SCREENING     Status: Normal   Collection Time   12/19/11  2:44 PM      Component Value Range Status Comment   MRSA by PCR NEGATIVE  NEGATIVE  Final   CULTURE, BLOOD (ROUTINE X 2)     Status: Normal   Collection Time   12/19/11  3:43 PM      Component Value Range Status Comment   Specimen Description BLOOD LEFT ARM  5 ML IN [facility] BOTTLE   Final    Special Requests NONE   Final    Culture  Setup Time [id number]   Final    Culture NO GROWTH 5 DAYS   Final    Report Status 12/25/2011 FINAL   Final   CULTURE, BLOOD (ROUTINE X 2)     Status: Normal   Collection Time   12/19/11  3:52 PM      Component Value Range Status Comment   Specimen Description BLOOD LEFT HAND  3 ML IN [facility] BOTTLE   Final    Special Requests NONE   Final    Culture  Setup Time [id number]   Final    Culture NO GROWTH 5 DAYS   Final      Report Status 12/25/2011 FINAL   Final   CULTURE, RESPIRATORY     Status: Normal   Collection Time   12/19/11  5:31 PM      Component Value Range Status Comment   Specimen Description TRACHEAL ASPIRATE   Final    Special Requests NONE   Final    Gram Stain     Final    Value: RARE WBC PRESENT,BOTH PMN AND MONONUCLEAR     RARE SQUAMOUS EPITHELIAL CELLS PRESENT     NO ORGANISMS SEEN   Culture Non-Pathogenic Oropharyngeal-type Flora Isolated.   Final    Report Status 12/22/2011 FINAL   Final   URINE CULTURE     Status: Normal   Collection Time   12/22/11  9:30 AM      Component Value Range Status Comment   Specimen Description URINE, CATHETERIZED   Final    Special Requests NONE   Final    Culture  Setup Time [id number]   Final    Colony Count NO GROWTH   Final    Culture NO GROWTH   Final    Report Status 12/23/2011 FINAL   Final   CULTURE, BLOOD (ROUTINE X 2)     Status: Normal   Collection Time   12/22/11 12:55 PM  Component Value Range Status Comment   Specimen Description BLOOD LEFT HAND   Final    Special Requests BOTTLES DRAWN AEROBIC AND ANAEROBIC [facility]   Final    Culture  Setup Time [id number]   Final    Culture NO GROWTH 5 DAYS   Final    Report Status 12/29/2011 FINAL   Final   CULTURE, BLOOD (ROUTINE X 2)     Status: Normal   Collection Time   12/22/11  1:05 PM      Component Value Range Status Comment   Specimen Description BLOOD LEFT HAND   Final    Special Requests BOTTLES DRAWN AEROBIC AND ANAEROBIC 5CC   Final    Culture  Setup Time [id number]   Final    Culture NO GROWTH 5 DAYS   Final    Report Status 12/29/2011 FINAL   Final   CULTURE, BLOOD (ROUTINE X 2)     Status: Normal (Preliminary result)   Collection Time   12/28/11  9:30 AM      Component Value Range Status Comment   Specimen Description BLOOD LEFT HAND   Final    Special Requests BOTTLES DRAWN AEROBIC ONLY 5CC   Final    Culture  Setup Time [id number]   Final    Culture     Final     Value:        BLOOD CULTURE RECEIVED NO GROWTH TO DATE CULTURE WILL BE HELD FOR 5 DAYS BEFORE ISSUING A FINAL NEGATIVE REPORT   Report Status PENDING   Incomplete   CULTURE, BLOOD (ROUTINE X 2)     Status: Normal (Preliminary result)   Collection Time   12/28/11  9:45 AM      Component Value Range Status Comment   Specimen Description BLOOD LEFT HAND   Final    Special Requests BOTTLES DRAWN AEROBIC ONLY [facility]   Final    Culture  Setup Time [id number]   Final    Culture     Final    Value:        BLOOD CULTURE RECEIVED NO GROWTH TO DATE CULTURE WILL BE HELD FOR 5 DAYS BEFORE ISSUING A FINAL NEGATIVE REPORT   Report Status PENDING   Incomplete   URINE CULTURE     Status: Normal   Collection Time   12/28/11  9:55 AM      Component Value Range Status Comment   Specimen Description URINE, CATHETERIZED   Final    Special Requests NONE   Final    Culture  Setup Time [id number]   Final    Colony Count NO GROWTH   Final    Culture NO GROWTH   Final    Report Status 12/29/2011 FINAL   Final   CULTURE, RESPIRATORY     Status: Normal   Collection Time   12/28/11  9:57 AM      Component Value Range Status Comment   Specimen Description ENDOTRACHEAL   Final    Special Requests NONE   Final    Gram Stain     Final    Value: FEW WBC PRESENT,BOTH PMN AND MONONUCLEAR     NO SQUAMOUS EPITHELIAL CELLS SEEN     NO ORGANISMS SEEN   Culture NO GROWTH 2 DAYS   Final    Report Status 12/30/2011 FINAL   Final     Anti-infectives     Start     Dose/Rate Route Frequency Ordered Stop   12/30/11 0800  Levofloxacin (LEVAQUIN) IVPB 250 mg        250 mg 50 mL/hr over 60 Minutes Intravenous Every 24 hours 12/29/11 1000     12/29/11 1200   vancomycin (VANCOCIN) IVPB 1000 mg/200 mL premix        1,000 mg 200 mL/hr over 60 Minutes Intravenous Every Hemodialysis 12/29/11 1109 12/29/11 1840   12/28/11 1300   cefTAZidime (FORTAZ) 1 g in dextrose 5 % 50 mL IVPB        1 g 100 mL/hr over 30 Minutes  Intravenous Every 24 hours 12/28/11 1146     12/28/11 1230   vancomycin (VANCOCIN) 2,000 mg in sodium chloride 0.9 % 500 mL IVPB        2,000 mg 250 mL/hr over 120 Minutes Intravenous  Once 12/28/11 1146 12/28/11 1435   12/26/11 0915   Levofloxacin (LEVAQUIN) IVPB 250 mg        250 mg 50 mL/hr over 60 Minutes Intravenous Every 24 hours 12/25/11 1034 12/29/11 0950   12/24/11 1000   vancomycin (VANCOCIN) IVPB 1000 mg/200 mL premix  Status:  Discontinued        1,000 mg 200 mL/hr over 60 Minutes Intravenous Every 24 hours 12/23/11 0843 12/25/11 0958   12/23/11 1200   piperacillin-tazobactam (ZOSYN) IVPB 2.25 g  Status:  Discontinued        2.25 g 100 mL/hr over 30 Minutes Intravenous 4 times per day 12/23/11 0910 12/25/11 0958   12/23/11 0915   Levofloxacin (LEVAQUIN) IVPB 250 mg  Status:  Discontinued        250 mg 50 mL/hr over 60 Minutes Intravenous Every 24 hours 12/23/11 0905 12/25/11 1034   12/22/11 1300   piperacillin-tazobactam (ZOSYN) IVPB 2.25 g  Status:  Discontinued        2.25 g 100 mL/hr over 30 Minutes Intravenous Every 8 hours 12/22/11 1233 12/23/11 0910   12/21/11 2359   vancomycin (VANCOCIN) 1,250 mg in sodium chloride 0.9 % 250 mL IVPB  Status:  Discontinued        1,250 mg 166.7 mL/hr over 90 Minutes Intravenous Every 12 hours 12/21/11 1532 12/22/11 0722   12/21/11 1330   vancomycin (VANCOCIN) IVPB 1000 mg/200 mL premix  Status:  Discontinued        1,000 mg 200 mL/hr over 60 Minutes Intravenous Every 8 hours 12/21/11 1225 12/21/11 1532   12/19/11 1330   vancomycin (VANCOCIN) IVPB 1000 mg/200 mL premix  Status:  Discontinued        1,000 mg 200 mL/hr over 60 Minutes Intravenous Every 8 hours 12/19/11 1302 12/21/11 1107   12/19/11 1315   piperacillin-tazobactam (ZOSYN) IVPB 3.375 g  Status:  Discontinued        3.375 g 12.5 mL/hr over 240 Minutes Intravenous Every 8 hours 12/19/11 1302 12/22/11 1233          Assessment: 45 yom admitted for bilateral  tibial fracture s/p syncope (was stable after surgery 4/19), but then transferred to ICU with s/sx's of septic shock and NSTEMI.   ID: [id number] antibiotics for Sepsis d/t aspiration PNA vs HAP?, No evidence of infection at fracture site. ARF- CRRT off. Re-broaden abx coverage 4/29 to vanc and fortaz for CXR - new worsening infiltrate and leukocytosis/fever. WBC decreasing, Tm 100.8.   Abx: 4/24 levo > 5/2 4/20 zosyn >4/26 4/20 vanc > 4/26 - restarted 4/29 4/29 fortaz>>   Cultures: MRSA (-) 4/23 blood: negative 4/23 urine: negative 4/20 trach aspirate: NPF 4/20 blood: negative  Neph: ARF: CVVHDF - off. Filter clotted 4/27 NOT resumed. K+ 4.1, Phos 2.9 lyets ok (corr Ca ~10.2). 4/28 intermittent HD - 4hr pulled 4L off. Catheter replaced 4/29 had pm session. HD 4/30-tolerated 3.5L removed. HD again 5/1 for volume removal. Plan for 3.5hrs at BFR of 400.   Goal of Therapy:  Pre HD level of 15-25 or Post-HD level of 5-15 mcg/mL  Plan:  1. Give Vancomycin 1g post-HD today if tolerates full session. 2. Will plan to check pre-HD level prior to next HD to ensure at goal level. Will follow-up Nephrology's plans.  3. Continue Fortaz 1g IV q24h.   Link Snuffer, PharmD, BCPS Clinical Pharmacist [phone] 12/30/2011,8:19 AM

## 2011-12-30 NOTE — Progress Notes (Signed)
Name: Reginald Gutierrez MRN: [id number] DOB: 1965/10/15    LOS: 12  PCCM PROGRESS NOTE  History of Present Illness: 46 y/o with history of DM and cocaine abuse admitted with bilateral tibia fracture s/p syncopal episode on 4/19.  Postoperatively he developed acute hypoxia and refractory shock, transient ST depression and troponin leak.  Course is complicated by ARDS requiring HFOV and acute renal failure requiring CVVHD.  Lines / Drains: 4/20 OETT >>> 4/20  OGT >>> 4/20 R IJ CVL >>> 4/20 R fem A-line >>> 4/23 L IJ DH >>>   Cultures: 4/20  Blood >>>negative 4/20  Sputum >>>negative 4/23  Blood >>> negative 4/23  Urine >>>negative 4/29 Sputum>>>negative 4/29 Urine>>>negative 4/29 Blood>>>  Abx: Zosyn 4/20 (empirical, G-, anaerobes) >>>4/26 Vanc 4/20 (empirical, MRSA) >>>4/26 Vanc restart 4/29>>> Levaquin 4/24 (empirical, atypical) >>> Ceftazidime Elita Quick) 4/29>>>  Tests / Events: 4/19  Admission, repair of bilat tibia Fx 4/20  Refractory shock, hypoxemic resp failure, NSTEMI.  Intubated 4/20  CT head >>> NAD 4/20  TTE >>> EF 25 to 30%, akinesis of mid/distal anteroseptal and apical area 4/21  Chest CT angio >>> No central pulmonary embolism. Slight progression of right greater than left airspace disease. 4/23  Renal US >>> no hydronephrosis, no urine obstruction 4/23  HFOV due to refractory hypoxemia.  Vasopressors.  CVVHD. 4/24  Off HFOV 4/29 transfused 1 U blood  Subjective/interval: Oxygenation improved.  Tolerates some pressure support.  Remains on low dose pressors, but off milrinone.  Vital Signs: Temp:  [98.2 F (36.8 C)-100.9 F (38.3 C)] 99.6 F (37.6 C) (05/01 0500) Pulse Rate:  [77-134] 100  (05/01 0600) Resp:  [18-33] 20  (05/01 0600) BP: (96-151)/(43-65) 117/51 mmHg (05/01 0600) SpO2:  [82 %-100 %] 95 % (05/01 0600) Arterial Line BP: (90-148)/(42-70) 121/49 mmHg (05/01 0600) FiO2 (%):  [30 %] 30 % (05/01 0600) Weight:  [185 lb 6.5 oz (84.1 kg)-189 lb 13.1  oz (86.1 kg)] 185 lb 6.5 oz (84.1 kg) (04/30 1850) I/O last 3 completed shifts: In: 4977.9 [I.V.:3117.9; NG/GT:1760; IV Piggyback:100] Out: 1191 [Urine:113; YNWGN:5621; Stool:2]  Physical Examination: General - no distress HEENT - ETT in place Cardiac - s1s2 regular, no murmur Chest - scattered rhonchi Abd - soft, nontender Ext - ankle edema Neuro - sedated, open's eyes with stimulation  Ventilator settings: Vent Mode:  [-] PRVC FiO2 (%):  [30 %] 30 % Set Rate:  [20 bmp] 20 bmp Vt Set:  [520 mL] 520 mL PEEP:  [8 cmH20] 8 cmH20 Plateau Pressure:  [14 cmH20-30 cmH20] 22 cmH20  CBC    Component Value Date/Time   WBC 14.7* 12/30/2011 0500   RBC 2.87* 12/30/2011 0500   HGB 8.1* 12/30/2011 0500   HCT 25.4* 12/30/2011 0500   PLT 326 12/30/2011 0500   MCV 88.5 12/30/2011 0500   MCH 28.2 12/30/2011 0500   MCHC 31.9 12/30/2011 0500   RDW 15.7* 12/30/2011 0500   LYMPHSABS 1.7 12/21/2011 0410   MONOABS 1.2* 12/21/2011 0410   EOSABS 0.2 12/21/2011 0410   BASOSABS 0.0 12/21/2011 0410    BMET    Component Value Date/Time   NA 133* 12/30/2011 0500   K 3.6 12/30/2011 0500   CL 98 12/30/2011 0500   CO2 26 12/30/2011 0500   GLUCOSE 145* 12/30/2011 0500   BUN 37* 12/30/2011 0500   CREATININE 3.84* 12/30/2011 0500   CALCIUM 8.7 12/30/2011 0500   GFRNONAA 18* 12/30/2011 0500   GFRAA 20* 12/30/2011 0500    Lab Results  Component Value Date   ALT 22 12/27/2011   AST 25 12/27/2011   ALKPHOS 127* 12/27/2011   BILITOT 0.4 12/27/2011   ABG    Component Value Date/Time   PHART 7.449 12/30/2011 0516   PCO2ART 40.3 12/30/2011 0516   PO2ART 69.0* 12/30/2011 0516   HCO3 27.8* 12/30/2011 0516   TCO2 29 12/30/2011 0516   ACIDBASEDEF 4.0* 12/23/2011 0424   O2SAT 94.0 12/30/2011 0516    Dg Chest Port 1 View  12/30/2011  *RADIOLOGY REPORT*  Clinical Data: Shortness of breath, intubated  PORTABLE CHEST - 1 VIEW  Comparison: 12/29/2011  Findings: Endotracheal tube tip 2.4 cm proximal to the carina.  NG tube descends below the image then  returns proximally, with tip projecting over the proximal stomach.  There are bibasilar/retrocardiac opacities. Probable layering pleural effusions.  No pneumothorax. Unchanged cardiomediastinal contours, upper normal limits.  Central vascular congestion.  Left IJ large bore catheter tip projects over the left brachiocephalic/SVC confluence. Right IJ catheter tip projects over the proximal SVC No acute osseous change.  IMPRESSION: Unchanged support devices, including endotracheal tube 2.4 cm proximal to the carina.  Unchanged pattern of bilateral airspace opacities.  Original Report Authenticated By: Waneta Martins, M.D.   Dg Chest Port 1 View  12/29/2011  *RADIOLOGY REPORT*  Clinical Data: Pneumonia.  PORTABLE CHEST - 1 VIEW  Comparison: 12/28/2011  Findings: Endotracheal tube is approximately 2.6 cm above the carina.  There are bilateral jugular central venous catheters which are both in the upper SVC region.  A nasogastric tube extends into the abdomen and appears to be coiled in the stomach.  Persistent airspace disease throughout the right mid and lower lung. Persistent consolidation or opacification at the left lung base. There may be decreased interstitial disease in the left lung.  IMPRESSION: Persistent bilateral airspace disease, right side greater than left. There may be slightly decreased edema or interstitial disease.  Original Report Authenticated By: Richarda Overlie, M.D.   Dg Chest Port 1 View  12/28/2011  *RADIOLOGY REPORT*  Clinical Data: Hemodialysis catheter insertion  PORTABLE CHEST - 1 VIEW  Comparison: Portable exam 1431 hours compared to 0527 hours  Findings: Endotracheal tube, nasogastric tube, and right jugular line unchanged. New left jugular line, tip projecting over SVC. Heart remains enlarged with pulmonary vascular congestion. Diffuse pulmonary infiltrates likely edema, little changed. No pneumothorax. Bones unremarkable.  IMPRESSION: Tip of left jugular line projects over SVC.  Persistent diffuse infiltrates/edema, unchanged.  Original Report Authenticated By: Lollie Marrow, M.D.     ASSESSMENT AND PLAN   Acute respiratory failure 2nd to PNA with ARDS, acute pulmonary edema, and possible fat embolism.  Respiratory mechanics and oxygenation improving with change in Abx 4/29, and fluid removal with HD. -decrease PEEP to 5, decrease RR to 14 -pressure support wean as tolerated -may need trach to assist with vent weaning -f/u CXR  Shock likely from acute systolic CHF with cardiogenic shock, and sepsis>>improving -wean off pressors to keep SBP > 90 -keep aline in for now -goal CVP 8   Acute systolic CHF in setting of NSTEMI.  EF on echo from 4/29 improved, but pt on milrinone then. -weaned off milrinone 4/30 -continue ASA -hold beta-blocker until BP improved -no ACE due to renal failure -?if he needs lipid lowering agent>>defer decision to cardiology  Hospital acquired pneumonia with new fever, infiltrate 4/28. -D8/10 levaquin -D3/x vancomycin, fortaz>>narrow abx once blood cultures final  Acute metabolic encephalopathy.  Has hx of cocaine abuse, but UDS  on admit negative. -sedation protocol while on vent -RASS goal -1 -daily WUA  Acute renal failure from ATN with shock/hypoxia, IV contrast load, and DM -transitioned to HD 4/28 -agree with fluid removal per renal -f/u renal panel  Protein calorie malnutrition -will ask nutrition to reassess tube feeds to limit fluid intake  Anemia of critical illness and chronic disease -f/u CBC intermittently -transfuse for Hb < 7  IDDM -SSI with lantus CBG (last 3)   Basename 12/30/11 0331 12/29/11 2333 12/29/11 2014  GLUCAP 149* 242* 204*   Hypothyroidism -resumed low dose synthroid 4/30  B/L tibial fractures s/p repair -per ortho  Best practice -protonix for SUP -SQ heparin for DVT proph   Critical care time 35 minutes.  Coralyn Helling, MD 12/30/2011, 7:57 AM Pager:  [phone]

## 2011-12-31 ENCOUNTER — Inpatient Hospital Stay (HOSPITAL_COMMUNITY): Payer: Medicare Other

## 2011-12-31 ENCOUNTER — Encounter (HOSPITAL_COMMUNITY): Payer: Self-pay | Admitting: Anesthesiology

## 2011-12-31 ENCOUNTER — Encounter (HOSPITAL_COMMUNITY)
Admission: EM | Disposition: A | Discharge status: Other Acute Inpatient Hospital | Payer: Self-pay | Source: Home / Self Care | Attending: Pulmonary Disease

## 2011-12-31 ENCOUNTER — Inpatient Hospital Stay (HOSPITAL_COMMUNITY): Payer: Medicare Other | Admitting: Anesthesiology

## 2011-12-31 DIAGNOSIS — N186 End stage renal disease: Secondary | ICD-10-CM

## 2011-12-31 HISTORY — PX: INSERTION OF DIALYSIS CATHETER: SHX1324

## 2011-12-31 LAB — RENAL FUNCTION PANEL
Albumin: 2.1 g/dL — ABNORMAL LOW (ref 3.5–5.2)
Calcium: 8.9 mg/dL (ref 8.4–10.5)
Chloride: 101 mEq/L (ref 96–112)
Creatinine, Ser: 6.1 mg/dL — ABNORMAL HIGH (ref 0.50–1.35)
GFR calc Af Amer: 12 mL/min — ABNORMAL LOW (ref >90)
GFR calc non Af Amer: 10 mL/min — ABNORMAL LOW (ref >90)
Sodium: 136 mEq/L (ref 135–145)

## 2011-12-31 LAB — GLUCOSE, CAPILLARY
Glucose-Capillary: 131 mg/dL — ABNORMAL HIGH (ref 70–99)
Glucose-Capillary: 204 mg/dL — ABNORMAL HIGH (ref 70–99)
Glucose-Capillary: 173 mg/dL — ABNORMAL HIGH (ref 70–99)
Glucose-Capillary: 168 mg/dL — ABNORMAL HIGH (ref 70–99)
Glucose-Capillary: 85 mg/dL (ref 70–99)

## 2011-12-31 LAB — CBC
MCH: 28.2 pg (ref 26.0–34.0)
MCHC: 31.7 g/dL (ref 30.0–36.0)
Platelets: 379 10*3/uL (ref 150–400)
RBC: 2.73 MIL/uL — ABNORMAL LOW (ref 4.22–5.81)

## 2011-12-31 LAB — LIPID PANEL: Cholesterol: 152 mg/dL (ref 0–200)

## 2011-12-31 SURGERY — INSERTION OF DIALYSIS CATHETER
Anesthesia: General | Site: Neck | Wound class: Clean

## 2011-12-31 MED ORDER — SODIUM CHLORIDE 0.9 % IR SOLN
Status: DC | PRN
  Administered 2011-12-31: 1000 mL

## 2011-12-31 MED ORDER — FENTANYL CITRATE 0.05 MG/ML IJ SOLN
INTRAMUSCULAR | Status: DC | PRN
  Administered 2011-12-31: 50 ug via INTRAVENOUS

## 2011-12-31 MED ORDER — DEXTROSE 10 % IV SOLN
INTRAVENOUS | Status: DC
  Administered 2011-12-31: 20 mL/h via INTRAVENOUS

## 2011-12-31 MED ORDER — SODIUM CHLORIDE 0.9 % IV SOLN
2500.0000 ug | INTRAVENOUS | Status: DC | PRN
  Administered 2011-12-31: 300 ug/h via INTRAVENOUS

## 2011-12-31 MED ORDER — ATORVASTATIN CALCIUM 10 MG PO TABS
10.0000 mg | ORAL_TABLET | Freq: Every day | ORAL | Status: DC
  Administered 2011-12-31 – 2012-01-07 (×8): 10 mg via ORAL
  Filled 2011-12-31 (×8): qty 1

## 2011-12-31 MED ORDER — VANCOMYCIN HCL IN DEXTROSE 1-5 GM/200ML-% IV SOLN
1000.0000 mg | Freq: Once | INTRAVENOUS | Status: AC
  Administered 2011-12-31: 1000 mg via INTRAVENOUS
  Filled 2011-12-31: qty 200

## 2011-12-31 MED ORDER — HEPARIN SODIUM (PORCINE) 1000 UNIT/ML IJ SOLN
INTRAMUSCULAR | Status: DC | PRN
  Administered 2011-12-31: 4.2 mL

## 2011-12-31 MED ORDER — MIDAZOLAM HCL 5 MG/5ML IJ SOLN
INTRAMUSCULAR | Status: DC | PRN
  Administered 2011-12-31: 2 mg via INTRAVENOUS

## 2011-12-31 MED ORDER — SODIUM CHLORIDE 0.9 % IR SOLN
Status: DC | PRN
  Administered 2011-12-31: 17:00:00

## 2011-12-31 MED ORDER — SODIUM CHLORIDE 0.9 % IV SOLN
50.0000 mg | INTRAVENOUS | Status: DC | PRN
  Administered 2011-12-31: 10 mg/h via INTRAVENOUS

## 2011-12-31 MED ORDER — CARVEDILOL 3.125 MG PO TABS
3.1250 mg | ORAL_TABLET | Freq: Two times a day (BID) | ORAL | Status: DC
  Administered 2011-12-31 – 2012-01-01 (×4): 3.125 mg via ORAL
  Filled 2011-12-31 (×9): qty 1

## 2011-12-31 SURGICAL SUPPLY — 45 items
ADH SKN CLS APL DERMABOND .7 (GAUZE/BANDAGES/DRESSINGS) ×1
BAG DECANTER FOR FLEXI CONT (MISCELLANEOUS) ×2 IMPLANT
BLADE SURG 11 STRL SS (BLADE) ×1 IMPLANT
CATH CANNON HEMO 15F 50CM (CATHETERS) IMPLANT
CATH CANNON HEMO 15FR 19 (HEMODIALYSIS SUPPLIES) ×1 IMPLANT
CATH CANNON HEMO 15FR 23CM (HEMODIALYSIS SUPPLIES) IMPLANT
CATH CANNON HEMO 15FR 31CM (HEMODIALYSIS SUPPLIES) IMPLANT
CATH CANNON HEMO 15FR 32 (HEMODIALYSIS SUPPLIES) IMPLANT
CATH CANNON HEMO 15FR 32CM (HEMODIALYSIS SUPPLIES) IMPLANT
CLOTH BEACON ORANGE TIMEOUT ST (SAFETY) ×2 IMPLANT
COVER PROBE W GEL 5X96 (DRAPES) IMPLANT
COVER SURGICAL LIGHT HANDLE (MISCELLANEOUS) ×2 IMPLANT
DERMABOND ADVANCED (GAUZE/BANDAGES/DRESSINGS) ×1
DERMABOND ADVANCED .7 DNX12 (GAUZE/BANDAGES/DRESSINGS) IMPLANT
DRAPE C-ARM 42X72 X-RAY (DRAPES) ×2 IMPLANT
DRAPE CHEST BREAST 15X10 FENES (DRAPES) ×2 IMPLANT
GAUZE SPONGE 2X2 8PLY STRL LF (GAUZE/BANDAGES/DRESSINGS) ×1 IMPLANT
GAUZE SPONGE 4X4 16PLY XRAY LF (GAUZE/BANDAGES/DRESSINGS) ×2 IMPLANT
GLOVE BIO SURGEON STRL SZ7.5 (GLOVE) ×2 IMPLANT
GLOVE BIOGEL PI IND STRL 7.5 (GLOVE) ×1 IMPLANT
GLOVE BIOGEL PI INDICATOR 7.5 (GLOVE) ×1
GOWN STRL NON-REIN LRG LVL3 (GOWN DISPOSABLE) ×5 IMPLANT
KIT BASIN OR (CUSTOM PROCEDURE TRAY) ×2 IMPLANT
KIT ROOM TURNOVER OR (KITS) ×2 IMPLANT
NDL 18GX1X1/2 (RX/OR ONLY) (NEEDLE) ×1 IMPLANT
NDL HYPO 25GX1X1/2 BEV (NEEDLE) ×1 IMPLANT
NEEDLE 18GX1X1/2 (RX/OR ONLY) (NEEDLE) ×2 IMPLANT
NEEDLE 22X1 1/2 (OR ONLY) (NEEDLE) ×2 IMPLANT
NEEDLE HYPO 25GX1X1/2 BEV (NEEDLE) ×2 IMPLANT
NS IRRIG 1000ML POUR BTL (IV SOLUTION) ×2 IMPLANT
PACK SURGICAL SETUP 50X90 (CUSTOM PROCEDURE TRAY) ×2 IMPLANT
PAD ARMBOARD 7.5X6 YLW CONV (MISCELLANEOUS) ×4 IMPLANT
SOAP 2 % CHG 4 OZ (WOUND CARE) ×2 IMPLANT
SPONGE GAUZE 2X2 STER 10/PKG (GAUZE/BANDAGES/DRESSINGS) ×1
SUT ETHILON 3 0 PS 1 (SUTURE) ×2 IMPLANT
SUT VICRYL 4-0 PS2 18IN ABS (SUTURE) ×2 IMPLANT
SYR 20CC LL (SYRINGE) ×4 IMPLANT
SYR 30ML LL (SYRINGE) IMPLANT
SYR 5ML LL (SYRINGE) ×4 IMPLANT
SYR CONTROL 10ML LL (SYRINGE) ×1 IMPLANT
SYRINGE 10CC LL (SYRINGE) ×2 IMPLANT
TAPE CLOTH SURG 4X10 WHT LF (GAUZE/BANDAGES/DRESSINGS) ×1 IMPLANT
TOWEL OR 17X24 6PK STRL BLUE (TOWEL DISPOSABLE) ×2 IMPLANT
TOWEL OR 17X26 10 PK STRL BLUE (TOWEL DISPOSABLE) ×2 IMPLANT
WATER STERILE IRR 1000ML POUR (IV SOLUTION) ×2 IMPLANT

## 2011-12-31 NOTE — Anesthesia Postprocedure Evaluation (Signed)
  Anesthesia Post-op Note  Patient: Reginald Gutierrez  Procedure(s) Performed: Procedure(s) (LRB): INSERTION OF DIALYSIS CATHETER (N/A)  Patient Location: ICU  Anesthesia Type: General  Level of Consciousness: sedated and Patient remains intubated per anesthesia plan  Airway and Oxygen Therapy: Patient remains intubated per anesthesia plan and Patient placed on Ventilator (see vital sign flow sheet for setting)  Post-op Pain: none  Post-op Assessment: Post-op Vital signs reviewed  Post-op Vital Signs: Reviewed and stable  Complications: No apparent anesthesia complications

## 2011-12-31 NOTE — Preoperative (Signed)
Beta Blockers   Reason not to administer Beta Blockers:Not Applicable, last dose 12/31/11 @ 12:56

## 2011-12-31 NOTE — Progress Notes (Signed)
Subjective:  Patient is now off pressor support. Renal function worse secondary to difficulty with dialysis yesterday. Hepatic function normal Rhythm sinus tachycardia.  Objective:  Vital Signs in the last 24 hours: Temp:  [98.9 F (37.2 C)-100.3 F (37.9 C)] 99.2 F (37.3 C) (05/02 0803) Pulse Rate:  [89-113] 100  (05/02 0803) Resp:  [15-25] 21  (05/02 0803) BP: (80-143)/(36-60) 141/60 mmHg (05/02 0803) SpO2:  [92 %-100 %] 97 % (05/02 0803) Arterial Line BP: (102-164)/(43-67) 127/57 mmHg (05/02 0700) FiO2 (%):  [30 %-40 %] 40 % (05/02 0803) Weight:  [85 kg (187 lb 6.3 oz)] 85 kg (187 lb 6.3 oz) (05/02 0500)  Intake/Output from previous day: 05/01 0701 - 05/02 0700 In: 2376.4 [I.V.:1136.4; NG/GT:1090; IV Piggyback:150] Out: 162 [Urine:162] Intake/Output from this shift:       . alteplase  2 mg Intracatheter Once  . alteplase  2 mg Intracatheter Once  . alteplase  2 mg Intracatheter Once  . alteplase  2 mg Intracatheter Once  . antiseptic oral rinse  15 mL Mouth Rinse QID  . aspirin  81 mg Per Tube Daily  . cefTAZidime (FORTAZ)  IV  1 g Intravenous Q24H  . chlorhexidine  15 mL Mouth Rinse BID  . feeding supplement (OSMOLITE 1.5 CAL)  1,000 mL Per Tube Q24H  . feeding supplement  30 mL Per Tube QID  . heparin  5,000 Units Subcutaneous Q8H  . insulin aspart  0-15 Units Subcutaneous Q4H  . insulin aspart  3 Units Subcutaneous Q4H  . insulin glargine  32 Units Subcutaneous Daily  . levofloxacin (LEVAQUIN) IV  250 mg Intravenous Q24H  . levothyroxine  50 mcg Oral QAC breakfast  . pantoprazole sodium  40 mg Per Tube Q1200  . sodium chloride  3 mL Intravenous Q12H  . DISCONTD: feeding supplement (OSMOLITE 1.5 CAL)  1,000 mL Per Tube Q24H  . DISCONTD: potassium chloride  40 mEq Per Tube Daily      . sodium chloride 20 mL/hr at 12/27/11 2000  . fentaNYL infusion INTRAVENOUS 300 mcg/hr (12/31/11 0907)  . midazolam (VERSED) infusion 10 mg/hr (12/31/11 0907)  . DISCONTD:  phenylephrine (NEO-SYNEPHRINE) Adult infusion Stopped (12/30/11 2207)    Physical Exam: The patient is intubated and sedated. Chest: clearer anteriorly  Heart: No murmur gallop or rub. Abdomen: No apparent tenderness Extremities:Mild pedal edema.  Lab Results:  Basename 12/31/11 0440 12/30/11 0500  WBC 15.6* 14.7*  HGB 7.7* 8.1*  PLT 379 326    Basename 12/31/11 0440 12/30/11 0500  NA 136 133*  K 4.8 3.6  CL 101 98  CO2 24 26  GLUCOSE 217* 145*  BUN 64* 37*  CREATININE 6.10* 3.84*   No results found for this basename: TROPONINI:2,CK,MB:2 in the last 72 hours Hepatic Function Panel  Basename 12/31/11 0440 12/30/11 0500  PROT -- 5.6*  ALBUMIN 2.1* --  AST -- 42*  ALT -- 32  ALKPHOS -- 98  BILITOT -- 0.3  BILIDIR -- <0.1  IBILI -- NOT CALCULATED    Basename 12/31/11 0440  CHOL 152   No results found for this basename: PROTIME in the last 72 hours  Imaging: Imaging results have been reviewed. Chest xray shows mild CM and persistent bilateral infiltrates and mild congestion/edema Cardiac Studies: [facility]* *[facility]* [address] [place], [place] [address] [phone]  ------------------------------------------------------------ Transthoracic Echocardiography  Patient: Reginald Gutierrez, Reginald Gutierrez MR #: [id number] Study Date: 12/28/2011 Gender: M Age: 46 Height: 167.6cm Weight: 88.8kg  BSA: 2.34m^2 Pt. Status: Room: 2102  Kevan Ny, MD PERFORMING [facility] ATTENDING [place], [place] SONOGRAPHER [place], [facility] ADMITTING Rory Percy cc:  ------------------------------------------------------------ LV EF: 40% - 45%  ------------------------------------------------------------ Indications: Acute respiratory distress 518.82.  ------------------------------------------------------------ History: PMH: Myocardial infarction. Risk factors: Current tobacco use. Hypertension. Diabetes  mellitus. Dyslipidemia.  ------------------------------------------------------------ Study Conclusions  - Left ventricle: The cavity size was normal. Wall thickness was normal. Systolic function was mildly to moderately reduced. The estimated ejection fraction was in the range of 40% to 45%. There is hypokinesis of the mid-distal inferoposterior and apical myocardium. - Mitral valve: Mild regurgitation. - Left atrium: The atrium was mildly dilated. Transthoracic echocardiography. M-mode, complete 2D, spectral Doppler, and color Doppler. Height  Assessment/Plan:  Patient Active Hospital Problem List:  NSTEMI (non-ST elevated myocardial infarction) (12/19/2011)   Assessment: Rhythm stable NSR   Plan: EKG yesterday stable.  No acute ischemic changes.  Poor R wave progression anterior leads.  2D echo results as noted above.  BP will now allow beginning low dose carvedilol with parameters.  Plan:  Start carvedilol.           Start low dose statin for endothelial stabilization. LDL 71.     LOS: 13 days    Cassell Clement 12/31/2011, 9:26 AM

## 2011-12-31 NOTE — Significant Event (Signed)
Noted to have trouble with HD catheter.  Was exchanged 4/30.  Able to draw blood and push fluids, but difficulty with running HD.  Able to run HD with head placed flat.  Will do this to get through HD session today.  He will need long-term HD.  Have therefore placed consult with Dr. Edilia Bo of [facility] to place tunneled HD cath.  Will make patient NPO with possibility that patient will have catheter placed later today.  Coralyn Helling, MD 12/31/2011, 11:23 AM Pager:  [phone]

## 2011-12-31 NOTE — Progress Notes (Signed)
Patient ID: CONLEE SLITER, male   DOB: [birth year], 46 y.o.   MRN: [id number]  [facility] Progress Note    Subjective:   Weaned off pressors last night- difficulties in sedation noted with the patient moving arms up and down with desaturations. Unable to dialyse yesterday due to HD catheter malfunction (Locked with tPA overnight)   Objective:   BP 122/49  Pulse 90  Temp(Src) 99.2 F (37.3 C) (Core (Comment))  Resp 16  Ht 5\' 6"  (1.676 m)  Wt 85 kg (187 lb 6.3 oz)  BMI 30.25 kg/m2  SpO2 100%  Intake/Output Summary (Last 24 hours) at 12/31/11 0725 Last data filed at 12/31/11 0500  Gross per 24 hour  Intake 2376.35 ml  Output    162 ml  Net 2214.35 ml   Weight change: -1.1 kg (-2 lb 6.8 oz)  Physical Exam: WUJ:[id number]- eyes open and moves hands up and done without purpose NFA:OZHYQ regular tachycardia, normal S1 and S2  Resp:Coarse BS bilaterally, no rales MVH:QION, obese/distended, NT, BS scant Ext:1+ edema LEs, 2+ edema UEs  Imaging: Dg Chest Port 1 View  12/30/2011  *RADIOLOGY REPORT*  Clinical Data: Shortness of breath, intubated  PORTABLE CHEST - 1 VIEW  Comparison: 12/29/2011  Findings: Endotracheal tube tip 2.4 cm proximal to the carina.  NG tube descends below the image then returns proximally, with tip projecting over the proximal stomach.  There are bibasilar/retrocardiac opacities. Probable layering pleural effusions.  No pneumothorax. Unchanged cardiomediastinal contours, upper normal limits.  Central vascular congestion.  Left IJ large bore catheter tip projects over the left brachiocephalic/SVC confluence. Right IJ catheter tip projects over the proximal SVC No acute osseous change.  IMPRESSION: Unchanged support devices, including endotracheal tube 2.4 cm proximal to the carina.  Unchanged pattern of bilateral airspace opacities.  Original Report Authenticated By: Waneta Martins, M.D.    Labs: BMET  Lab 12/31/11 0440 12/30/11 0500 12/29/11 0500  12/28/11 0438 12/27/11 1027 12/27/11 1009 12/27/11 0629 12/27/11 0430 12/26/11 1600 12/26/11 0500  NA 136 133* 134* 135 135 137 138 -- -- --  K 4.8 3.6 4.1 3.5 3.5 3.5 3.4* -- -- --  CL 101 98 98 98 98 96 96 -- -- --  CO2 24 26 26 27  -- -- -- 26 22 25   GLUCOSE 217* 145* 240* 157* 218* 259* 253* -- -- --  BUN 64* 37* 30* 32* 30* 25* 26* -- -- --  CREATININE 6.10* 3.84* 3.25* 3.54* 3.10* 2.60* 2.40* -- -- --  ALB -- -- -- -- -- -- -- -- -- --  CALCIUM 8.9 8.7 8.4 8.6 -- -- -- 8.2* 8.0* 8.2*  PHOS 3.7 2.9 2.9 2.5 -- -- -- 1.8* 1.8* 2.4   CBC  Lab 12/31/11 0440 12/30/11 0500 12/29/11 0500 12/28/11 0438  WBC 15.6* 14.7* 20.4* 17.6*  NEUTROABS -- -- -- --  HGB 7.7* 8.1* 8.4* 7.2*  HCT 24.3* 25.4* 25.7* 22.4*  MCV 89.0 88.5 87.7 88.5  PLT 379 326 363 383    Medications:      . alteplase  2 mg Intracatheter Once  . alteplase  2 mg Intracatheter Once  . alteplase  2 mg Intracatheter Once  . alteplase  2 mg Intracatheter Once  . antiseptic oral rinse  15 mL Mouth Rinse QID  . aspirin  81 mg Per Tube Daily  . cefTAZidime (FORTAZ)  IV  1 g Intravenous Q24H  . chlorhexidine  15 mL Mouth Rinse BID  . feeding supplement (  OSMOLITE 1.5 CAL)  1,000 mL Per Tube Q24H  . feeding supplement  30 mL Per Tube QID  . heparin  5,000 Units Subcutaneous Q8H  . insulin aspart  0-15 Units Subcutaneous Q4H  . insulin aspart  3 Units Subcutaneous Q4H  . insulin glargine  32 Units Subcutaneous Daily  . levofloxacin (LEVAQUIN) IV  250 mg Intravenous Q24H  . levothyroxine  50 mcg Oral QAC breakfast  . pantoprazole sodium  40 mg Per Tube Q1200  . potassium chloride  40 mEq Per Tube Daily  . sodium chloride  3 mL Intravenous Q12H  . DISCONTD: feeding supplement (OSMOLITE 1.5 CAL)  1,000 mL Per Tube Q24H  . DISCONTD: levofloxacin (LEVAQUIN) IV  250 mg Intravenous Q24H     Assessment/ Plan:   1. AKI due to ATN from combination CINP and shock-Unable to do dialysis yesterday due to inability to flush HD  catheter ports- locked with tPA overnight after failing 2 hour short dwell, will re-attempt HD again today- with continued large fluid input from drips/TFs for which UF will be needed to allow improvement in respiratory status. If catheter does not work- will need replacement today. 2. Volume excess/pulm edema- Attempt HD with ultrafiltration today (May be a challenge off pressors) 3. Acute coronary syndrome with positive CE's and wall motion abnormalities on ECHO, EF improved on milrinone- Plans per cardiology 4. TYPE I DM- on lantus/novolog 5. Aspiration pneumonia with VDRF on full support- on levaquin 6. S/p fall with bilat LE tibial fracture- S/P IM pinning- Per ortho  Zetta Bills, MD 12/31/2011, 7:25 AM

## 2011-12-31 NOTE — Progress Notes (Signed)
Physical Therapy Cancellation Note: order received, chart reviewed, and spoke with RN. Pt currently receiving HD and on vent, sedated. Rn stated pt was more alert this am and could follow commands. Will attempt to see pt in Am for ROM assessment and potential mobility progression if okayed by CCM. Thanks Delaney Meigs, PT [phone]

## 2011-12-31 NOTE — Anesthesia Preprocedure Evaluation (Signed)
Anesthesia Evaluation  Patient identified by MRN, date of birth, ID band Patient unresponsive    Reviewed: Allergy & Precautions, H&P , NPO status , Patient's Chart, lab work & pertinent test results  Airway       Dental   Pulmonary  Pt currently intubated and ventilated.  Hx of resp distress, MI and now Renal failure.         Cardiovascular Rhythm:Regular Rate:Normal     Neuro/Psych    GI/Hepatic   Endo/Other    Renal/GU      Musculoskeletal   Abdominal   Peds  Hematology   Anesthesia Other Findings   Reproductive/Obstetrics                           Anesthesia Physical Anesthesia Plan  ASA: IV  Anesthesia Plan: General   Post-op Pain Management:    Induction: Intravenous  Airway Management Planned: Oral ETT  Additional Equipment:   Intra-op Plan:   Post-operative Plan: Post-operative intubation/ventilation  Informed Consent: I have reviewed the patients History and Physical, chart, labs and discussed the procedure including the risks, benefits and alternatives for the proposed anesthesia with the patient or authorized representative who has indicated his/her understanding and acceptance.     Plan Discussed with: CRNA, Anesthesiologist and Surgeon  Anesthesia Plan Comments:         Anesthesia Quick Evaluation

## 2011-12-31 NOTE — Transfer of Care (Signed)
Immediate Anesthesia Transfer of Care Note  Patient: Reginald Gutierrez  Procedure(s) Performed: Procedure(s) (LRB): INSERTION OF DIALYSIS CATHETER (N/A)  Patient Location: ICU  Anesthesia Type: General  Level of Consciousness: sedated and Patient remains intubated per anesthesia plan  Airway & Oxygen Therapy: Patient remains intubated per anesthesia plan and Patient placed on Ventilator (see vital sign flow sheet for setting)  Post-op Assessment: Report given to PACU RN  Post vital signs: Reviewed and stable  Complications: No apparent anesthesia complications

## 2011-12-31 NOTE — Progress Notes (Signed)
Discussed with Dr. Craige Cotta patient position to complete HD.  Dialysis machine will not run when patient [facility] is 30 degrees or higher even with assessment from Brett Canales Minor NP and Dr. Craige Cotta.  Will keep Pt HOB less than 30 degrees in order to complete current treatment.  Oral care provided and Tube feeds turned off.

## 2011-12-31 NOTE — Progress Notes (Signed)
Name: Reginald Gutierrez MRN: [id number] DOB: 24-May-1966    LOS: 13  PCCM PROGRESS NOTE  History of Present Illness: 46 y/o with history of DM and cocaine abuse admitted with bilateral tibia fracture s/p syncopal episode on 4/19.  Postoperatively he developed acute hypoxia and refractory shock, transient ST depression and troponin leak.  Course is complicated by ARDS requiring HFOV and acute renal failure requiring CVVHD.  Lines / Drains: 4/20 OETT >>> 4/20 R IJ CVL >>> 4/20 R fem A-line >>>5/2 4/23 L IJ HD>>>Changed 4/30>>   Cultures: 4/20  Blood >>>negative 4/20  Sputum >>>negative 4/23  Blood >>> negative 4/23  Urine >>>negative 4/29 Sputum>>>negative 4/29 Urine>>>negative 4/29 Blood>>>  Abx: Zosyn 4/20 (empirical, G-, anaerobes) >>>4/26 Vanc 4/20 (empirical, MRSA) >>>4/26 Vanc restart 4/29>>> Levaquin 4/24 (empirical, atypical) >>> Ceftazidime Elita Quick) 4/29>>>  Tests / Events: 4/19  Admission, repair of bilat tibia Fx 4/20  Refractory shock, hypoxemic resp failure, NSTEMI.  Intubated 4/20  CT head >>> NAD 4/20  TTE >>> EF 25 to 30%, akinesis of mid/distal anteroseptal and apical area 4/21  Chest CT angio >>> No central pulmonary embolism. Slight progression of right greater than left airspace disease. 4/23  Renal US >>> no hydronephrosis, no urine obstruction 4/23  HFOV due to refractory hypoxemia.  Vasopressors.  CVVHD. 4/24  Off HFOV 4/29 transfused 1 U blood  Subjective/interval: Oxygenation improved.  Tolerates some pressure support for some time>>then developed diaphoresis, tachypnea, agitation, and hypoxia.  Off pressors.  Vital Signs: Temp:  [98.9 F (37.2 C)-100.3 F (37.9 C)] 99.2 F (37.3 C) (05/02 0803) Pulse Rate:  [89-113] 100  (05/02 0803) Resp:  [15-25] 21  (05/02 0803) BP: (80-143)/(36-60) 141/60 mmHg (05/02 0803) SpO2:  [92 %-100 %] 97 % (05/02 0803) Arterial Line BP: (102-164)/(43-67) 127/57 mmHg (05/02 0700) FiO2 (%):  [30 %-40 %] 40 % (05/02  0803) Weight:  [187 lb 6.3 oz (85 kg)] 187 lb 6.3 oz (85 kg) (05/02 0500) I/O last 3 completed shifts: In: 3830.8 [I.V.:2005.8; NG/GT:1675; IV Piggyback:150] Out: 200 [Urine:200]  Physical Examination: General - in distress during SBT before transition back to full support>>then resolved HEENT - ETT in place Cardiac - s1s2 regular, no murmur Chest - scattered rhonchi Abd - soft, nontender Ext - ankle edema Neuro - sedated, open's eyes with stimulation  Ventilator settings: Vent Mode:  [-] CPAP;PSV FiO2 (%):  [30 %-40 %] 40 % Set Rate:  [14 bmp] 14 bmp Vt Set:  [520 mL] 520 mL PEEP:  [5 cmH20] 5 cmH20 Pressure Support:  [5 cmH20-10 cmH20] 5 cmH20 Plateau Pressure:  [11 cmH20-20 cmH20] 20 cmH20  CBC    Component Value Date/Time   WBC 15.6* 12/31/2011 0440   RBC 2.73* 12/31/2011 0440   HGB 7.7* 12/31/2011 0440   HCT 24.3* 12/31/2011 0440   PLT 379 12/31/2011 0440   MCV 89.0 12/31/2011 0440   MCH 28.2 12/31/2011 0440   MCHC 31.7 12/31/2011 0440   RDW 15.8* 12/31/2011 0440   LYMPHSABS 1.7 12/21/2011 0410   MONOABS 1.2* 12/21/2011 0410   EOSABS 0.2 12/21/2011 0410   BASOSABS 0.0 12/21/2011 0410    BMET    Component Value Date/Time   NA 136 12/31/2011 0440   K 4.8 12/31/2011 0440   CL 101 12/31/2011 0440   CO2 24 12/31/2011 0440   GLUCOSE 217* 12/31/2011 0440   BUN 64* 12/31/2011 0440   CREATININE 6.10* 12/31/2011 0440   CALCIUM 8.9 12/31/2011 0440   GFRNONAA 10* 12/31/2011 0440  GFRAA 12* 12/31/2011 0440    Dg Chest Port 1 View  12/31/2011  *RADIOLOGY REPORT*  Clinical Data: Pulmonary edema  PORTABLE CHEST - 1 VIEW  Comparison: Yesterday  Findings: Stable cardiomegaly. Stable edema.  Endotracheal tube, NG tube, left internal jugular dialysis catheter, right internal jugular central venous catheter are stable. No pneumothorax.  IMPRESSION: Stable edema.  Original Report Authenticated By: Donavan Burnet, M.D.   Dg Chest Port 1 View  12/30/2011  *RADIOLOGY REPORT*  Clinical Data: Shortness of breath,  intubated  PORTABLE CHEST - 1 VIEW  Comparison: 12/29/2011  Findings: Endotracheal tube tip 2.4 cm proximal to the carina.  NG tube descends below the image then returns proximally, with tip projecting over the proximal stomach.  There are bibasilar/retrocardiac opacities. Probable layering pleural effusions.  No pneumothorax. Unchanged cardiomediastinal contours, upper normal limits.  Central vascular congestion.  Left IJ large bore catheter tip projects over the left brachiocephalic/SVC confluence. Right IJ catheter tip projects over the proximal SVC No acute osseous change.  IMPRESSION: Unchanged support devices, including endotracheal tube 2.4 cm proximal to the carina.  Unchanged pattern of bilateral airspace opacities.  Original Report Authenticated By: Waneta Martins, M.D.     ASSESSMENT AND PLAN   Acute respiratory failure 2nd to PNA with ARDS, acute pulmonary edema, and possible fat embolism.  Respiratory mechanics and oxygenation improving with change in Abx 4/29, and fluid removal with HD. -pressure support wean as tolerated>>not ready for extubation -will need trach to assist with vent weaning>>will need to discuss further with family -f/u CXR  Shock likely from acute systolic CHF with cardiogenic shock, and sepsis>>improved -weaned off pressors 5/1 -d/c arterial line -goal CVP < 8   Acute systolic CHF in setting of NSTEMI.  EF on echo from 4/29 improved, but pt on milrinone then. -weaned off milrinone 4/30 -continue ASA -BP improved 5/2>>may be able to add beta-blocker>>defer to cardiology -no ACE due to renal failure -Cardiology to decide about whether pt would benefit from lipid lowering agent  Lipid Panel     Component Value Date/Time   CHOL 152 12/31/2011 0440   TRIG 263* 12/31/2011 0440   HDL 28* 12/31/2011 0440   CHOLHDL 5.4 12/31/2011 0440   VLDL 53* 12/31/2011 0440   LDLCALC 71 12/31/2011 0440    Hospital acquired pneumonia with new fever, infiltrate 4/28. -D9/10  levaquin -D3/x vancomycin, fortaz>>narrow abx once blood cultures final  Acute metabolic encephalopathy.  Has hx of cocaine abuse, but UDS on admit negative. -sedation protocol while on vent -RASS goal -1 -daily WUA  Acute renal failure from ATN with shock/hypoxia, IV contrast load, and DM -transitioned to HD 4/28 -agree with fluid removal per renal -f/u renal panel  Protein calorie malnutrition -tube feeds changed 5/1 to osmolite 1.5 @40  ml/hr with 30 ml prostat qid to limit fluid intake  Anemia of critical illness and chronic disease -f/u CBC intermittently -transfuse for Hb < 7  IDDM -SSI with lantus CBG (last 3)   Basename 12/31/11 0747 12/31/11 0408 12/30/11 2325  GLUCAP 173* 204* 131*   Hypothyroidism -resumed low dose synthroid 4/30  B/L tibial fractures s/p repair -per ortho -will need gentle ROM with PT  Best practice -protonix for SUP -SQ heparin for DVT proph   Critical care time 35 minutes.  Coralyn Helling, MD 12/31/2011, 8:45 AM Pager:  [phone]

## 2011-12-31 NOTE — Progress Notes (Signed)
Subjective: Pt sedated and on vent.   Objective: Vital signs in last 24 hours: Temp:  [98.9 F (37.2 C)-100.3 F (37.9 C)] 100 F (37.8 C) (05/02 0500) Pulse Rate:  [89-126] 113  (05/02 0500) Resp:  [15-31] 25  (05/02 0500) BP: (80-171)/(36-78) 122/49 mmHg (05/02 0400) SpO2:  [92 %-98 %] 98 % (05/02 0500) Arterial Line BP: (102-164)/(43-67) 155/64 mmHg (05/02 0500) FiO2 (%):  [30 %] 30 % (05/02 0500) Weight:  [85 kg (187 lb 6.3 oz)] 85 kg (187 lb 6.3 oz) (05/02 0500)  Intake/Output from previous day: 05/01 0701 - 05/02 0700 In: 2376.4 [I.V.:1136.4; NG/GT:1090; IV Piggyback:150] Out: 162 [Urine:162] Intake/Output this shift: Total I/O In: 936.8 [I.V.:536.8; NG/GT:400] Out: 67 [Urine:67]   Basename 12/31/11 0440 12/30/11 0500 12/29/11 0500  HGB 7.7* 8.1* 8.4*    Basename 12/31/11 0440 12/30/11 0500  WBC 15.6* 14.7*  RBC 2.73* 2.87*  HCT 24.3* 25.4*  PLT 379 326    Basename 12/31/11 0440 12/30/11 0500  NA 136 133*  K 4.8 3.6  CL 101 98  CO2 24 26  BUN 64* 37*  CREATININE 6.10* 3.84*  GLUCOSE 217* 145*  CALCIUM 8.9 8.7   No results found for this basename: LABPT:2,INR:2 in the last 72 hours  Sedate, vented Bilateral calfs soft, wounds well approx with staples no sign of infection, DP dopplered. Feet warm.  Assessment/Plan: PO Bilateral tibia IM nail for fx stable. No sign of infection or compartment synd. Plan when ok with medical service would have Physical therapy work gentle ROM of extremity joints.   Jamelle Rushing 12/31/2011, 6:37 AM

## 2011-12-31 NOTE — Progress Notes (Signed)
UR Completed.  Reginald Gutierrez [phone] 12/31/2011  

## 2011-12-31 NOTE — Consult Note (Signed)
[facility] [place]  Patient name: Reginald Gutierrez MRN: [id number] DOB: 1965/12/11 Sex: male  REASON FOR CONSULT: needs cuffed dialysis catheter  HPI: Reginald Gutierrez is a 46 y.o. male who was admitted on 12/18/2011. He apparently had a altered mental status  And fell and sustained bilateral lower extremity fractures. He went to the OR on 12/18/2011 were for repair of his tibial fractures. Postoperatively he had acute shortness of breath, hypoxia, confusion, and refractory hypotension. He has had a complicated hospital course apparently complicated by a NSTEMI him I and also aspiration pneumonia.  In addition he had acute renal failure secondary to ATN from a combination of IV dye and hypotension. He has been dialyzed via a temporary dialysis catheter. Reportedly they have had some problems with him using the temporary catheter and we were asked to place a permanent dialysis catheter.  The history is obtained from the records as the patient is intubated and there is no family currently available.  Past Medical History  Diagnosis Date  . Diabetes mellitus type 1   . Hyperlipidemia   . Hypothyroidism   . Depression   . Umbilical hernia   . Hypertension     History reviewed. No pertinent family history.  SOCIAL HISTORY: History  Substance Use Topics  . Smoking status: Current Everyday Smoker -- 0.5 packs/day    Types: Cigarettes  . Smokeless tobacco: Never Used  . Alcohol Use: No    No Known Allergies  Current Facility-Administered Medications  Medication Dose Route Frequency Provider Last Rate Last Dose  . 0.9 %  sodium chloride infusion   Intravenous Continuous Lonia Farber, MD 20 mL/hr at 12/27/11 2000    . acetaminophen (TYLENOL) solution 650 mg  650 mg Per Tube Q4H PRN Catha Brow, MD   650 mg at 12/26/11 2200  . alteplase (ACTIVASE) injection 2 mg  2 mg Intracatheter Once Jay K. Allena Katz, MD   2 mg at 12/30/11 1455  . alteplase (ACTIVASE) injection 2  mg  2 mg Intracatheter Once Jay K. Allena Katz, MD   2 mg at 12/30/11 1456  . antiseptic oral rinse (BIOTENE) solution 15 mL  15 mL Mouth Rinse QID Lonia Farber, MD   15 mL at 12/31/11 0400  . aspirin chewable tablet 81 mg  81 mg Per Tube Daily Lonia Farber, MD   81 mg at 12/30/11 1200  . atorvastatin (LIPITOR) tablet 10 mg  10 mg Oral q1800 Cassell Clement, MD      . carvedilol (COREG) tablet 3.125 mg  3.125 mg Oral BID WC Cassell Clement, MD      . cefTAZidime (FORTAZ) 1 g in dextrose 5 % 50 mL IVPB  1 g Intravenous Q24H Coralyn Helling, MD   1 g at 12/30/11 1300  . chlorhexidine (PERIDEX) 0.12 % solution 15 mL  15 mL Mouth Rinse BID [name], MD   15 mL at 12/31/11 4098  . feeding supplement (OSMOLITE 1.5 CAL) liquid 1,000 mL  1,000 mL Per Tube Q24H Hettie Holstein, RD   1,000 mL at 12/30/11 1624  . feeding supplement (PRO-STAT SUGAR FREE 64) liquid 30 mL  30 mL Per Tube QID Hettie Holstein, RD   30 mL at 12/30/11 2259  . fentaNYL (SUBLIMAZE) 10 mcg/mL in sodium chloride 0.9 % 250 mL infusion  50-400 mcg/hr Intravenous Titrated [name], MD 30 mL/hr at 12/31/11 0907 300 mcg/hr at 12/31/11 0907   And  . fentaNYL (SUBLIMAZE) bolus via  infusion 50-100 mcg  50-100 mcg Intravenous Q6H PRN [name], MD   200 mcg at 12/30/11 1546  . heparin injection 1,700 Units  20 Units/kg Dialysis PRN Vonna Kotyk K. Allena Katz, MD      . heparin injection 2,000 Units  2,000 Units Dialysis PRN Maree Krabbe, MD   2,000 Units at 12/28/11 2240  . heparin injection 5,000 Units  5,000 Units Subcutaneous Q8H Storm Frisk, MD   5,000 Units at 12/31/11 0825  . insulin aspart (novoLOG) injection 0-15 Units  0-15 Units Subcutaneous Q4H Lonia Farber, MD   3 Units at 12/31/11 0808  . insulin aspart (novoLOG) injection 3 Units  3 Units Subcutaneous Q4H Lorretta Harp, MD   3 Units at 12/31/11 0813  . insulin glargine (LANTUS) injection 32 Units  32 Units  Subcutaneous Daily Coralyn Helling, MD   32 Units at 12/30/11 1000  . Levofloxacin (LEVAQUIN) IVPB 250 mg  250 mg Intravenous Q24H Coralyn Helling, MD   250 mg at 12/30/11 0848  . levothyroxine (SYNTHROID, LEVOTHROID) tablet 50 mcg  50 mcg Oral QAC breakfast Alanson Puls, MD   50 mcg at 12/31/11 0808  . midazolam (VERSED) 1 mg/mL in sodium chloride 0.9 % 50 mL infusion  2-10 mg/hr Intravenous Titrated [name], MD 10 mL/hr at 12/31/11 0907 10 mg/hr at 12/31/11 0907   And  . midazolam (VERSED) bolus via infusion 1-2 mg  1-2 mg Intravenous Q2H PRN [name], MD   2 mg at 12/20/11 1635  . ondansetron (ZOFRAN) injection 4 mg  4 mg Intravenous Q6H PRN Alison Murray, MD      . pantoprazole sodium (PROTONIX) 40 mg/20 mL oral suspension 40 mg  40 mg Per Tube Q1200 Coralyn Helling, MD   40 mg at 12/30/11 1400  . sodium chloride 0.9 % injection 3 mL  3 mL Intravenous Q12H Alison Murray, MD   3 mL at 12/24/11 2200  . DISCONTD: phenylephrine (NEO-SYNEPHRINE) 40,000 mcg in dextrose 5 % 250 mL infusion  30-200 mcg/min Intravenous Titrated Coralyn Helling, MD   20 mcg/min at 12/30/11 1900  . DISCONTD: potassium chloride 20 MEQ/15ML (10%) liquid 40 mEq  40 mEq Per Tube Daily Lorretta Harp, MD   40 mEq at 12/30/11 1200  . DISCONTD: vancomycin (VANCOCIN) IVPB 1000 mg/200 mL premix  1,000 mg Intravenous Q hemodialysis Fayne Norrie, PHARMD        REVIEW OF SYSTEMS: Unable to obtain review of systems.  PHYSICAL EXAM: Filed Vitals:   12/31/11 1145 12/31/11 1200 12/31/11 1215 12/31/11 1230  BP: 112/55 105/46 104/62 95/50  Pulse: 104 103 102 101  Temp: 99.2 F (37.3 C) 99.1 F (37.3 C) 99.1 F (37.3 C) 99.1 F (37.3 C)  TempSrc:      Resp: 18 19 17 17   Height:      Weight:      SpO2:  97% 96% 97%   Body mass index is 30.53 kg/(m^2). GENERAL: The patient is a well-nourished male, who is sedated and on the ventilator. The vital signs are documented above. CARDIOVASCULAR: There is a regular rate and  rhythm.  He has a temporary dialysis catheter in the left IJ, and a triple-lumen catheter in the right IJ. PULMONARY: There is good air exchange bilaterally without wheezing or rales. ABDOMEN: Soft and non-tender with normal pitched bowel sounds.  NEUROLOGIC: unable to assess as the patient is sedated  DATA:  Lab Results  Component Value Date  WBC 15.6* 12/31/2011   HGB 7.7* 12/31/2011   HCT 24.3* 12/31/2011   MCV 89.0 12/31/2011   PLT 379 12/31/2011   Lab Results  Component Value Date   NA 136 12/31/2011   K 4.8 12/31/2011   CL 101 12/31/2011   CO2 24 12/31/2011   Lab Results  Component Value Date   CREATININE 6.10* 12/31/2011   Lab Results  Component Value Date   INR 1.47 12/20/2011   INR 1.40 12/19/2011   INR 1.13 12/19/2011   Lab Results  Component Value Date   HGBA1C 5.9* 12/19/2011   CBG (last 3)   Basename 12/31/11 0747 12/31/11 0408 12/30/11 2325  GLUCAP 173* 204* 131*   Chest x-ray shows pulmonary edema  MEDICAL ISSUES: We are asked to place a dialysis catheter in this patient. He is currently on dialysis via his temporary catheter. His tube feeds were stopped at 10:50 AM. Will place the cuff dialysis catheter this afternoon. I have spoken to his nurse and it sounds like he will continue to need his triple-lumen catheter. This reason we will likely remove the temporary dialysis catheter in the left IJ and place a new left IJ cuff dialysis catheter. Once the family is available I will discuss the procedure and risks with them before proceeding.  Jaeanna Mccomber S [facility] [place] Beeper: [phone]

## 2011-12-31 NOTE — Progress Notes (Signed)
PT HD CATHETER ARTERIAL PORT SLUGGISH, PROCEED TO DO HD TX ANYWAY D/T PT NEED TO GET DIALYZED. IMMEDIATELY AFTER BEGINNING TX, AP FLUCTUATED TO LOW 70'S AND HIGH 300'S. DR. Allena Katz AWARE AND ORDERED TO STOP TX AND COME BACK LATER WHEN NEW HD CATH IN PLACE. RINSED BACK SUCCESSFUL. REPORTED OFF TO CHRIS, RN AND AWARE TO CALL BACK TO HD UNIT TO TRY TO DIALYZE PT AGAIN.

## 2011-12-31 NOTE — Op Note (Signed)
NAME: Reginald Gutierrez    MRN: [id number] DOB: November 02, 1965    DATE OF OPERATION: 12/31/2011  PREOP DIAGNOSIS: acute renal failure  POSTOP DIAGNOSIS: same  PROCEDURE: Ultrasound guided placement of right IJ 19 cm Diatek cath  SURGEON: Di Kindle. Edilia Bo, MD, FACS  ASSIST: none  ANESTHESIA: Gen.   EBL: minimal  INDICATIONS: [name] is a 46 y.o. male wound developed acute renal failure during a complicated hospital admission after an acute mental status change. He had a temporary dialysis catheter in his left IJ and a triple-lumen catheter in his right IJ area were asked to place a cuff dialysis catheter  FINDINGS: The left temporary dialysis catheter was removed. It entered the jugular vein high in the neck and I did not think it could be tunneled appropriately for a cuffed dialysis catheter. Subsequent duplex showed that the left IJ was thrombosed. This reason, going back in on the left was not option. I did not want to place a subclavian vein catheter given that he may ultimately need long-term access and risk subclavian vein stenosis. I therefore elected to place the catheter on the right side below the triple-lumen catheter as there appeared to be adequate room for this.  TECHNIQUE: The patient was brought to the operating room and received a general anesthetic. The neck and upper chest were prepped and draped in the usual sterile fashion. Under ultrasound guidance, the right IJ was cannulated below the level of the triple-lumen catheter. A guidewire was introduced into the superior vena cava under fluoroscopic control. The tract was dilated and then the dilator and peel-away sheath were passed over the wire and the wire and dilator removed. A cath was passed through the peel-away sheath and positioned in the right atrium. The exit site the cath was selected and the Catheter was then brought through the tunnel cut to the appropriate length and the distal ports were attached. Both ports  withdrew easily,  and were then flushed with saline, and then  filled with concentrated heparin. The catheter was secured at its exit site with 3-0 nylon suture. The IJ cannulation site was closed with a subcuticular stitch. Sterile dressing was applied. Patient tolerated the procedure well and was transferred back to the intensive care unit in stable condition. All needle and sponge counts were correct.  Waverly Ferrari, MD, FACS [facility]  DATE OF DICTATION:   12/31/2011

## 2012-01-01 ENCOUNTER — Inpatient Hospital Stay (HOSPITAL_COMMUNITY): Payer: Medicare Other

## 2012-01-01 ENCOUNTER — Encounter (HOSPITAL_COMMUNITY): Payer: Medicare Other

## 2012-01-01 LAB — GLUCOSE, CAPILLARY
Glucose-Capillary: 112 mg/dL — ABNORMAL HIGH (ref 70–99)
Glucose-Capillary: 89 mg/dL (ref 70–99)

## 2012-01-01 LAB — CBC
HCT: 22.4 % — ABNORMAL LOW (ref 39.0–52.0)
Hemoglobin: 7.1 g/dL — ABNORMAL LOW (ref 13.0–17.0)
MCH: 28.2 pg (ref 26.0–34.0)
MCHC: 31.7 g/dL (ref 30.0–36.0)
MCV: 89.6 fL (ref 78.0–100.0)
MCV: 88.3 fL (ref 78.0–100.0)
Platelets: 394 10*3/uL (ref 150–400)
RBC: 2.91 MIL/uL — ABNORMAL LOW (ref 4.22–5.81)
RDW: 15.7 % — ABNORMAL HIGH (ref 11.5–15.5)

## 2012-01-01 LAB — RENAL FUNCTION PANEL
Albumin: 2.1 g/dL — ABNORMAL LOW (ref 3.5–5.2)
Chloride: 99 mEq/L (ref 96–112)
Creatinine, Ser: 5.32 mg/dL — ABNORMAL HIGH (ref 0.50–1.35)
GFR calc Af Amer: 14 mL/min — ABNORMAL LOW (ref >90)
GFR calc non Af Amer: 12 mL/min — ABNORMAL LOW (ref >90)
Potassium: 4.2 mEq/L (ref 3.5–5.1)

## 2012-01-01 LAB — TYPE AND SCREEN
ABO/RH(D): A POS
Antibody Screen: NEGATIVE
Unit division: 0
Unit division: 0

## 2012-01-01 MED ORDER — HEPARIN SODIUM (PORCINE) 1000 UNIT/ML DIALYSIS: 2000.0000 [IU] | Freq: Once | INTRAMUSCULAR | Status: DC

## 2012-01-01 MED ORDER — ALTEPLASE 2 MG IJ SOLR
6.0000 mg | Freq: Once | INTRAMUSCULAR | Status: AC
  Administered 2012-01-01: 4.2 mg
  Filled 2012-01-01: qty 6

## 2012-01-01 MED ORDER — OSMOLITE 1.5 CAL PO LIQD
1000.0000 mL | ORAL | Status: DC
  Administered 2012-01-01 – 2012-01-07 (×4): 1000 mL
  Filled 2012-01-01 (×9): qty 1000

## 2012-01-01 MED ORDER — PRO-STAT SUGAR FREE PO LIQD
30.0000 mL | Freq: Four times a day (QID) | ORAL | Status: DC
  Administered 2012-01-01 – 2012-01-07 (×23): 30 mL
  Filled 2012-01-01 (×28): qty 30

## 2012-01-01 MED ORDER — DEXTROSE 50 % IV SOLN
INTRAVENOUS | Status: AC
  Administered 2012-01-01: 50 mL
  Filled 2012-01-01: qty 50

## 2012-01-01 NOTE — Progress Notes (Signed)
Palliative Medicine Team consult for goals of care requested by Dr Craige Cotta spoke with patient's mom at bedside meeting scheduled for tomorrow, Saturday to allow patient's sister, Fritzi Mandes to also be involved, meeting confirmed for Saturday, 01/02/12 @ 12 noon.   Valente David, RN 01/01/2012, 2:22 PM Palliative Medicine Team RN Liaison [phone]

## 2012-01-01 NOTE — Progress Notes (Signed)
Name: Reginald Gutierrez MRN: [id number] DOB: [birth year]    LOS: 14  PCCM PROGRESS NOTE  History of Present Illness: 46 y/o with history of DM and cocaine abuse admitted with bilateral tibia fracture s/p syncopal episode on 4/19.  Postoperatively he developed acute hypoxia and refractory shock, transient ST depression and troponin leak.  Course is complicated by ARDS requiring HFOV and acute renal failure requiring CVVHD.  Lines / Drains: 4/20 OETT >>> 4/20 R IJ CVL >>> 4/20 R fem A-line >>>5/2 4/23 L IJ HD>>>Changed 4/30>>5/2 (thrombosed) 5/2 Rt IJ Diatek cath (D[facility])>>  Cultures: 4/20  Blood >>>negative 4/20  Sputum >>>negative 4/23  Blood >>> negative 4/23  Urine >>>negative 4/29 Sputum>>>negative 4/29 Urine>>>negative 4/29 Blood>>>  Abx: Zosyn 4/20 (empirical, G-, anaerobes) >>>4/26 Vanc 4/20 (empirical, MRSA) >>>4/26 Vanc restart 4/29>>> Levaquin 4/24 (empirical, atypical) >>>5/3 Ceftazidime Elita Quick) 4/29>>>  Tests / Events: 4/19  Admission, repair of bilat tibia Fx 4/20  Refractory shock, hypoxemic resp failure, NSTEMI.  Intubated 4/20  CT head >>> NAD 4/20  TTE >>> EF 25 to 30%, akinesis of mid/distal anteroseptal and apical area 4/21  Chest CT angio >>> No central pulmonary embolism. Slight progression of right greater than left airspace disease. 4/23  Renal US >>> no hydronephrosis, no urine obstruction 4/23  HFOV due to refractory hypoxemia.  Vasopressors.  CVVHD. 4/24  Off HFOV 4/29 transfused 1 U blood 5/3 Tranfuse 1 unit PRBC  Subjective/interval: Trach held today due to pt getting heparin injection this AM.    Vital Signs: Temp:  [99.1 F (37.3 C)-100.5 F (38.1 C)] 100.5 F (38.1 C) (05/02 1600) Pulse Rate:  [76-111] 95  (05/03 0729) Resp:  [14-28] 15  (05/03 0345) BP: (91-141)/(42-87) 109/55 mmHg (05/03 0729) SpO2:  [95 %-100 %] 99 % (05/03 0700) FiO2 (%):  [30 %-60 %] 40 % (05/03 0345) Weight:  [180 lb 5.4 oz (81.8 kg)-189 lb 2.5 oz (85.8 kg)] 180  lb 5.4 oz (81.8 kg) (05/02 1401) I/O last 3 completed shifts: In: 3109.1 [I.V.:1999.1; NG/GT:810; IV Piggyback:300] Out: 3626 [Urine:112; Other:3514]  Physical Examination: General - no distress HEENT - ETT in place Cardiac - s1s2 regular, no murmur Chest - scattered rhonchi Abd - soft, nontender Ext - ankle edema Neuro - sedated, open's eyes with stimulation  Ventilator settings: Vent Mode:  [-] PRVC FiO2 (%):  [30 %-60 %] 40 % Set Rate:  [14 bmp] 14 bmp Vt Set:  [520 mL] 520 mL PEEP:  [5 cmH20] 5 cmH20 Pressure Support:  [5 cmH20] 5 cmH20 Plateau Pressure:  [18 cmH20-29 cmH20] 21 cmH20  CBC    Component Value Date/Time   WBC 15.3* 01/01/2012 0402   RBC 2.50* 01/01/2012 0402   HGB 7.1* 01/01/2012 0402   HCT 22.4* 01/01/2012 0402   PLT 350 01/01/2012 0402   MCV 89.6 01/01/2012 0402   MCH 28.4 01/01/2012 0402   MCHC 31.7 01/01/2012 0402   RDW 15.6* 01/01/2012 0402   LYMPHSABS 1.7 12/21/2011 0410   MONOABS 1.2* 12/21/2011 0410   EOSABS 0.2 12/21/2011 0410   BASOSABS 0.0 12/21/2011 0410    BMET    Component Value Date/Time   NA 136 01/01/2012 0402   K 4.2 01/01/2012 0402   CL 99 01/01/2012 0402   CO2 27 01/01/2012 0402   GLUCOSE 93 01/01/2012 0402   BUN 54* 01/01/2012 0402   CREATININE 5.32* 01/01/2012 0402   CALCIUM 8.7 01/01/2012 0402   GFRNONAA 12* 01/01/2012 0402   GFRAA 14* 01/01/2012 0402  Dg Chest Port 1 View  01/01/2012  *RADIOLOGY REPORT*  Clinical Data: Pulmonary edema, pneumonia follow up.  PORTABLE CHEST - 1 VIEW  Comparison: 12/31/2011  Findings: Cardiomegaly.  Central vascular congestion and perihilar opacities.  Bibasilar opacities.  Probable small pleural effusions. Endotracheal tube tip projects 3 cm proximal the carina.  Right subclavian vas cath tips project over the proximal SVC.  No pneumothorax.  No acute osseous abnormality.  IMPRESSION:  Cardiomegaly with pulmonary edema pattern, similar to prior.  Small pleural effusions and bibasilar opacities; atelectasis versus infiltrate.  Similar to prior.  Original Report Authenticated By: Waneta Martins, M.D.   Dg Chest Port 1 View  12/31/2011  *RADIOLOGY REPORT*  Clinical Data: Diatek catheter placement.  PORTABLE CHEST - 1 VIEW  Comparison: Earlier film, same date.  Findings: The right IJ diatek catheter tips are in good position. The distal port is at the cavoatrial junction and proximal port is in the distal SVC.  No pneumothorax.  The existing support apparatus is stable.  There is slight worsening lung aeration with a diffuse airspace process and lower lung volumes.  IMPRESSION: Right IJ diatek catheter tips in good position without complicating features.  Original Report Authenticated By: P. Loralie Champagne, M.D.   Dg Chest Port 1 View  12/31/2011  *RADIOLOGY REPORT*  Clinical Data: Pulmonary edema  PORTABLE CHEST - 1 VIEW  Comparison: Yesterday  Findings: Stable cardiomegaly. Stable edema.  Endotracheal tube, NG tube, left internal jugular dialysis catheter, right internal jugular central venous catheter are stable. No pneumothorax.  IMPRESSION: Stable edema.  Original Report Authenticated By: Donavan Burnet, M.D.   Dg Fluoro Guide Cv Line-no Report  12/31/2011  CLINICAL DATA: needed for dialysis placement   FLOURO GUIDE CV LINE  Fluoroscopy was utilized by the requesting physician.  No radiographic  interpretation.       ASSESSMENT AND PLAN   Acute respiratory failure 2nd to PNA with ARDS, acute pulmonary edema, and possible fat embolism.  Respiratory mechanics and oxygenation improving with change in Abx 4/29, and fluid removal with HD. -pressure support wean as tolerated>>not ready for extubation -will need trach to arrange for bedside trach early next week -f/u CXR  Shock likely from acute systolic CHF with cardiogenic shock, and sepsis>>resolved  Acute systolic CHF in setting of NSTEMI. -continue ASA -coreg, lipitor started 5/2 -no ACE due to renal failure  Hospital acquired pneumonia with new fever,  infiltrate 4/28. -D10/10 levaquin -D4/x vancomycin, fortaz>>narrow abx once blood cultures final  Acute metabolic encephalopathy.  Has hx of cocaine abuse, but UDS on admit negative. -sedation protocol while on vent -RASS goal -1 -daily WUA  Acute renal failure from ATN with shock/hypoxia, IV contrast load, and DM -transitioned to HD 4/28 -agree with fluid removal per renal -f/u renal panel  Protein calorie malnutrition -tube feeds changed 5/1 to osmolite 1.5 @40  ml/hr with 30 ml prostat qid to limit fluid intake  Anemia of critical illness and chronic disease -f/u CBC intermittently -transfuse for Hb < 7 >>will give 1 unit PRBC 5/3  IDDM -SSI with lantus CBG (last 3)   Basename 01/01/12 0408 01/01/12 0057 12/31/11 2327  GLUCAP 93 89 85   Hypothyroidism -resumed low dose synthroid 4/30  B/L tibial fractures s/p repair -per ortho -will need gentle ROM with PT  Best practice -protonix for SUP -SQ heparin for DVT proph   Critical care time 35 minutes.  Coralyn Helling, MD 01/01/2012, 7:59 AM Pager:  [phone]

## 2012-01-01 NOTE — Evaluation (Signed)
Physical Therapy Evaluation Patient Details Name: Reginald Gutierrez MRN: [id number] DOB: [birth year] Today's Date: 01/01/2012 Time: 4010-2725 PT Time Calculation (min): 15 min  PT Assessment / Plan / Recommendation Clinical Impression  Pt s/p fall with bilateral tibia fx, VDRF, new HD access, and AMS with combativeness on ventilator. Pt able to perform some AAROM with inconsistent command following for movement today and decreased participation after 5 repetitions of activity with PROM 20 reps in all planes for bil LE. Pt performed AROM bil shoulder flexion to 90degrees x 5 and elbow flexion AROM x 4 bilaterally. Pt unable to perform even basic transfers or provide PLOF at this point due to hypotension, ventilator and sedation. Pt will benefit from acute therapy to maximize strength and function pending medical stability.     PT Assessment  Patient needs continued PT services    Follow Up Recommendations  Other (comment) (to be determined as pt progresses)    Equipment Recommendations  Other (comment) (to be determined)    Frequency Min 1X/week    Precautions / Restrictions Precautions Precaution Comments: vent Restrictions Weight Bearing Restrictions: Yes RLE Weight Bearing: Non weight bearing LLE Weight Bearing: Non weight bearing   Pertinent Vitals/Pain Pt unable to state with grimace at times during bil knee flexion      Mobility  Bed Mobility Bed Mobility: Not assessed Ambulation/Gait Ambulation/Gait Assistance: Not tested (comment)  Mobility unable to be assessed at this time   Exercises     PT Goals Acute Rehab PT Goals PT Goal Formulation: Patient unable to participate in goal setting Time For Goal Achievement: 01/15/12 Potential to Achieve Goals: Fair Additional Goals Additional Goal #1: pt will follow commands 50% of session for simple one step motor commands PT Goal: Additional Goal #1 - Progress: Goal set today Additional Goal #2: Pt will demonstrate AA/ROM for  all joints of bil LE 5 repetitions each with min assist PT Goal: Additional Goal #2 - Progress: Goal set today  Visit Information  Last PT Received On: 01/01/12 Assistance Needed: +1    Subjective Data  Subjective: pt unable to state sedated on vent   Prior Functioning       Cognition  Overall Cognitive Status: Difficult to assess Difficult to assess due to: Other (comment) (vent with sedation) Cognition - Other Comments: pt able to follow one step motor commands inconsistently    Extremity/Trunk Assessment Right Upper Extremity Assessment RUE ROM/Strength/Tone: Deficits;Unable to fully assess RUE ROM/Strength/Tone Deficits: ROM grossly functional with strength demonstrated as 3+/5 unable to fully assess secondary to sedation Left Upper Extremity Assessment LUE ROM/Strength/Tone: Deficits;Unable to fully assess LUE ROM/Strength/Tone Deficits: ROM grossly functional with strength demonstrated as 3+/5 unable to fully assess secondary to sedation Right Lower Extremity Assessment RLE ROM/Strength/Tone: Deficits;Unable to fully assess RLE ROM/Strength/Tone Deficits: dorsiflexion only to neutral with patient actively pushing against P.T into plantarflexion, knee flexion grossly 70degrees visual assessment with AAROM, hip extension unable to be tested, hip Abdct/ADD ROM WFL no A/ROM for hip noted Left Lower Extremity Assessment LLE ROM/Strength/Tone: Deficits;Unable to fully assess LLE ROM/Strength/Tone Deficits: dorsiflexion only to neutral, knee flexion grossly 70degrees visual assessment with PROM and limited AROM to grossly 30degrees flexion with full knee extension PROM, hip extension unable to be tested, hip Abdct/ADD ROM WFL no A/ROM for hip noted   Balance    End of Session PT - End of Session Activity Tolerance: Other (comment) (evaluation limited by medical status) Patient left: in bed   [facility] 01/01/2012, 9:19  AM  Delaney Meigs, PT [phone]

## 2012-01-01 NOTE — Significant Event (Signed)
Spoke with pt's sister (she works as Engineer, civil (consulting) in [employer]) this morning.  She had expressed concerns about pt's condition prior to this admission.  She was concerned that he was depressed, and was not sure if he would want aggressive medical care.  She is aware that he will likely need long term dialysis, and is not sure if this is something the patient would be agreeable to.  Spoke with pt's mother this morning, and updated about current status.  Explained that trach will be planned for early part of next week.  Explained that he will likely need chronic dialysis, and will need therapy for systolic congestive heart failure.  Explained that trach will assist with vent weaning, but unable to predict at this time if/when he could be liberated from vent.  Also explained that he will likely need placement for continue care as he improves from his current illness.  Explained that it may be difficult to find placement with trach/vent/HD.  Will ask palliative care to consult to assist with further discussions about goals of care.  Coralyn Helling, MD 01/01/2012, 12:22 PM Pager:  [phone]

## 2012-01-01 NOTE — Progress Notes (Addendum)
ANTIBIOTIC CONSULT NOTE - FOLLOW UP  Pharmacy Consult for Vancomycin/Fortaz Indication: rule out pneumonia  No Known Allergies  Patient Measurements: Height: 5\' 6"  (167.6 cm) Weight: 180 lb 5.4 oz (81.8 kg) IBW/kg (Calculated) : 63.8  Vital Signs: Temp: 99.8 F (37.7 C) (05/03 0805) Temp src: Oral (05/03 0805) BP: 109/55 mmHg (05/03 0729) Pulse Rate: 95  (05/03 0729) Intake/Output from previous day: 05/02 0701 - 05/03 0700 In: 1986.3 [I.V.:1356.3; NG/GT:330; IV Piggyback:300] Out: 3559 [Urine:45] Intake/Output from this shift:    Labs:  Basename 01/01/12 0402 12/31/11 0440 12/30/11 0500  WBC 15.3* 15.6* 14.7*  HGB 7.1* 7.7* 8.1*  PLT 350 379 326  LABCREA -- -- --  CREATININE 5.32* 6.10* 3.84*   Estimated Creatinine Clearance: 17.6 ml/min (by C-G formula based on Cr of 5.32). No results found for this basename: VANCOTROUGH:2,VANCOPEAK:2,VANCORANDOM:2,GENTTROUGH:2,GENTPEAK:2,GENTRANDOM:2,TOBRATROUGH:2,TOBRAPEAK:2,TOBRARND:2,AMIKACINPEAK:2,AMIKACINTROU:2,AMIKACIN:2, in the last 72 hours   Microbiology: Recent Results (from the past 720 hour(s))  MRSA PCR SCREENING     Status: Normal   Collection Time   12/19/11  2:44 PM      Component Value Range Status Comment   MRSA by PCR NEGATIVE  NEGATIVE  Final   CULTURE, BLOOD (ROUTINE X 2)     Status: Normal   Collection Time   12/19/11  3:43 PM      Component Value Range Status Comment   Specimen Description BLOOD LEFT ARM  5 ML IN [facility] BOTTLE   Final    Special Requests NONE   Final    Culture  Setup Time [id number]   Final    Culture NO GROWTH 5 DAYS   Final    Report Status 12/25/2011 FINAL   Final   CULTURE, BLOOD (ROUTINE X 2)     Status: Normal   Collection Time   12/19/11  3:52 PM      Component Value Range Status Comment   Specimen Description BLOOD LEFT HAND  3 ML IN [facility] BOTTLE   Final    Special Requests NONE   Final    Culture  Setup Time [id number]   Final    Culture NO GROWTH 5 DAYS   Final    Report  Status 12/25/2011 FINAL   Final   CULTURE, RESPIRATORY     Status: Normal   Collection Time   12/19/11  5:31 PM      Component Value Range Status Comment   Specimen Description TRACHEAL ASPIRATE   Final    Special Requests NONE   Final    Gram Stain     Final    Value: RARE WBC PRESENT,BOTH PMN AND MONONUCLEAR     RARE SQUAMOUS EPITHELIAL CELLS PRESENT     NO ORGANISMS SEEN   Culture Non-Pathogenic Oropharyngeal-type Flora Isolated.   Final    Report Status 12/22/2011 FINAL   Final   URINE CULTURE     Status: Normal   Collection Time   12/22/11  9:30 AM      Component Value Range Status Comment   Specimen Description URINE, CATHETERIZED   Final    Special Requests NONE   Final    Culture  Setup Time [id number]   Final    Colony Count NO GROWTH   Final    Culture NO GROWTH   Final    Report Status 12/23/2011 FINAL   Final   CULTURE, BLOOD (ROUTINE X 2)     Status: Normal   Collection Time   12/22/11 12:55 PM  Component Value Range Status Comment   Specimen Description BLOOD LEFT HAND   Final    Special Requests BOTTLES DRAWN AEROBIC AND ANAEROBIC [facility]   Final    Culture  Setup Time [id number]   Final    Culture NO GROWTH 5 DAYS   Final    Report Status 12/29/2011 FINAL   Final   CULTURE, BLOOD (ROUTINE X 2)     Status: Normal   Collection Time   12/22/11  1:05 PM      Component Value Range Status Comment   Specimen Description BLOOD LEFT HAND   Final    Special Requests BOTTLES DRAWN AEROBIC AND ANAEROBIC 5CC   Final    Culture  Setup Time [id number]   Final    Culture NO GROWTH 5 DAYS   Final    Report Status 12/29/2011 FINAL   Final   CULTURE, BLOOD (ROUTINE X 2)     Status: Normal (Preliminary result)   Collection Time   12/28/11  9:30 AM      Component Value Range Status Comment   Specimen Description BLOOD LEFT HAND   Final    Special Requests BOTTLES DRAWN AEROBIC ONLY 5CC   Final    Culture  Setup Time [id number]   Final    Culture     Final    Value:         BLOOD CULTURE RECEIVED NO GROWTH TO DATE CULTURE WILL BE HELD FOR 5 DAYS BEFORE ISSUING A FINAL NEGATIVE REPORT   Report Status PENDING   Incomplete   CULTURE, BLOOD (ROUTINE X 2)     Status: Normal (Preliminary result)   Collection Time   12/28/11  9:45 AM      Component Value Range Status Comment   Specimen Description BLOOD LEFT HAND   Final    Special Requests BOTTLES DRAWN AEROBIC ONLY [facility]   Final    Culture  Setup Time [id number]   Final    Culture     Final    Value:        BLOOD CULTURE RECEIVED NO GROWTH TO DATE CULTURE WILL BE HELD FOR 5 DAYS BEFORE ISSUING A FINAL NEGATIVE REPORT   Report Status PENDING   Incomplete   URINE CULTURE     Status: Normal   Collection Time   12/28/11  9:55 AM      Component Value Range Status Comment   Specimen Description URINE, CATHETERIZED   Final    Special Requests NONE   Final    Culture  Setup Time [id number]   Final    Colony Count NO GROWTH   Final    Culture NO GROWTH   Final    Report Status 12/29/2011 FINAL   Final   CULTURE, RESPIRATORY     Status: Normal   Collection Time   12/28/11  9:57 AM      Component Value Range Status Comment   Specimen Description ENDOTRACHEAL   Final    Special Requests NONE   Final    Gram Stain     Final    Value: FEW WBC PRESENT,BOTH PMN AND MONONUCLEAR     NO SQUAMOUS EPITHELIAL CELLS SEEN     NO ORGANISMS SEEN   Culture NO GROWTH 2 DAYS   Final    Report Status 12/30/2011 FINAL   Final     Anti-infectives     Start     Dose/Rate Route Frequency Ordered Stop   12/31/11 1400  vancomycin (VANCOCIN) IVPB 1000 mg/200 mL premix        1,000 mg 200 mL/hr over 60 Minutes Intravenous  Once 12/31/11 1247 12/31/11 1425   12/30/11 1200   vancomycin (VANCOCIN) IVPB 1000 mg/200 mL premix  Status:  Discontinued        1,000 mg 200 mL/hr over 60 Minutes Intravenous Every Hemodialysis 12/30/11 0841 12/30/11 1556   12/30/11 0900   Levofloxacin (LEVAQUIN) IVPB 250 mg        250 mg 50 mL/hr over 60  Minutes Intravenous Every 24 hours 12/30/11 0829 12/31/11 1834   12/30/11 0800   Levofloxacin (LEVAQUIN) IVPB 250 mg  Status:  Discontinued        250 mg 50 mL/hr over 60 Minutes Intravenous Every 24 hours 12/29/11 1000 12/30/11 0829   12/29/11 1200   vancomycin (VANCOCIN) IVPB 1000 mg/200 mL premix        1,000 mg 200 mL/hr over 60 Minutes Intravenous Every Hemodialysis 12/29/11 1109 12/29/11 1840   12/28/11 1300   cefTAZidime (FORTAZ) 1 g in dextrose 5 % 50 mL IVPB        1 g 100 mL/hr over 30 Minutes Intravenous Every 24 hours 12/28/11 1146     12/28/11 1230   vancomycin (VANCOCIN) 2,000 mg in sodium chloride 0.9 % 500 mL IVPB        2,000 mg 250 mL/hr over 120 Minutes Intravenous  Once 12/28/11 1146 12/28/11 1435   12/26/11 0915   Levofloxacin (LEVAQUIN) IVPB 250 mg        250 mg 50 mL/hr over 60 Minutes Intravenous Every 24 hours 12/25/11 1034 12/29/11 0950   12/24/11 1000   vancomycin (VANCOCIN) IVPB 1000 mg/200 mL premix  Status:  Discontinued        1,000 mg 200 mL/hr over 60 Minutes Intravenous Every 24 hours 12/23/11 0843 12/25/11 0958   12/23/11 1200   piperacillin-tazobactam (ZOSYN) IVPB 2.25 g  Status:  Discontinued        2.25 g 100 mL/hr over 30 Minutes Intravenous 4 times per day 12/23/11 0910 12/25/11 0958   12/23/11 0915   Levofloxacin (LEVAQUIN) IVPB 250 mg  Status:  Discontinued        250 mg 50 mL/hr over 60 Minutes Intravenous Every 24 hours 12/23/11 0905 12/25/11 1034   12/22/11 1300   piperacillin-tazobactam (ZOSYN) IVPB 2.25 g  Status:  Discontinued        2.25 g 100 mL/hr over 30 Minutes Intravenous Every 8 hours 12/22/11 1233 12/23/11 0910   12/21/11 2359   vancomycin (VANCOCIN) 1,250 mg in sodium chloride 0.9 % 250 mL IVPB  Status:  Discontinued        1,250 mg 166.7 mL/hr over 90 Minutes Intravenous Every 12 hours 12/21/11 1532 12/22/11 0722   12/21/11 1330   vancomycin (VANCOCIN) IVPB 1000 mg/200 mL premix  Status:  Discontinued        1,000  mg 200 mL/hr over 60 Minutes Intravenous Every 8 hours 12/21/11 1225 12/21/11 1532   12/19/11 1330   vancomycin (VANCOCIN) IVPB 1000 mg/200 mL premix  Status:  Discontinued        1,000 mg 200 mL/hr over 60 Minutes Intravenous Every 8 hours 12/19/11 1302 12/21/11 1107   12/19/11 1315   piperacillin-tazobactam (ZOSYN) IVPB 3.375 g  Status:  Discontinued        3.375 g 12.5 mL/hr over 240 Minutes Intravenous Every 8 hours 12/19/11 1302 12/22/11 1233  Assessment: 45 yom admitted for bilateral tibial fracture s/p syncope (was stable after surgery 4/19), but then transferred to ICU with s/sx's of septic shock and NSTEMI.   ID: Vanc/ceftaz D#5 (D#13 antibiotics) for Sepsis d/t aspiration PNA vs HAP? - Tmax 100.5, WBC 15.3 -Plan for HD today and tomorrow. F/u session toleration.   4/24 levo > 5/2 4/20 zosyn >4/26 4/20 vanc > 4/26 - restarted 4/29 >> 4/29 fortaz>>  MRSA (-) 4/29 bloodx2: NGTD 4/29 Urine/Resp (-) 4/23 Blood/urine (-) 4/20 Resp/blood (-)  Goal of Therapy:  Pre-HD Vanc level 15-25 mcg/mL or Post-HD Vanc level 5-53mcg/mL  Plan:  1. Check pre-HD Vancomycin level today. If at or below goal 15-25, will give vancomycin post-HD.  2. Continue ceftaz 1gm IV Q24H 3. F/u further HD plans  Link Snuffer, PharmD, BCPS Clinical Pharmacist [phone] 01/01/2012,8:22 AM   Addendum: Pre-HD vancomycin trough is 38.1.  No vancomycin post-HD today.   Link Snuffer, PharmD, BCPS Clinical Pharmacist [phone] 01/01/2012, 11:22 AM

## 2012-01-01 NOTE — Progress Notes (Signed)
Patient ID: Reginald Gutierrez, male   DOB: 1965-12-13, 46 y.o.   MRN: [id number]   [facility] Progress Note    Subjective:   No acute events overnight. Appreciate help from VVS in placing a tunneled HD catheter    Objective:   BP 109/55  Pulse 95  Temp(Src) 100.5 F (38.1 C) (Core (Comment))  Resp 15  Ht 5\' 6"  (1.676 m)  Wt 81.8 kg (180 lb 5.4 oz)  BMI 29.11 kg/m2  SpO2 95%  Intake/Output Summary (Last 24 hours) at 01/01/12 0739 Last data filed at 01/01/12 0600  Gross per 24 hour  Intake 1986.3 ml  Output   3559 ml  Net -1572.7 ml   Weight change: 0.8 kg (1 lb 12.2 oz)  Physical Exam: WUJ:[id number] on vent - spontaneously eye opening and arm movements AOZ:HYQMV RRR, normal S1 and s2  Resp:Coarse BS bilaterally, no rales HQI:ONGE, obese, NT, BS normal XBM:WUXLK-4+MWNUU over ankles, 1+ UE edema  Imaging: Dg Chest Port 1 View  01/01/2012  *RADIOLOGY REPORT*  Clinical Data: Pulmonary edema, pneumonia follow up.  PORTABLE CHEST - 1 VIEW  Comparison: 12/31/2011  Findings: Cardiomegaly.  Central vascular congestion and perihilar opacities.  Bibasilar opacities.  Probable small pleural effusions. Endotracheal tube tip projects 3 cm proximal the carina.  Right subclavian vas cath tips project over the proximal SVC.  No pneumothorax.  No acute osseous abnormality.  IMPRESSION:  Cardiomegaly with pulmonary edema pattern, similar to prior.  Small pleural effusions and bibasilar opacities; atelectasis versus infiltrate. Similar to prior.  Original Report Authenticated By: Waneta Martins, M.D.   Dg Chest Port 1 View  12/31/2011  *RADIOLOGY REPORT*  Clinical Data: Diatek catheter placement.  PORTABLE CHEST - 1 VIEW  Comparison: Earlier film, same date.  Findings: The right IJ diatek catheter tips are in good position. The distal port is at the cavoatrial junction and proximal port is in the distal SVC.  No pneumothorax.  The existing support apparatus is stable.  There is  slight worsening lung aeration with a diffuse airspace process and lower lung volumes.  IMPRESSION: Right IJ diatek catheter tips in good position without complicating features.  Original Report Authenticated By: P. Loralie Champagne, M.D.   Dg Chest Port 1 View  12/31/2011  *RADIOLOGY REPORT*  Clinical Data: Pulmonary edema  PORTABLE CHEST - 1 VIEW  Comparison: Yesterday  Findings: Stable cardiomegaly. Stable edema.  Endotracheal tube, NG tube, left internal jugular dialysis catheter, right internal jugular central venous catheter are stable. No pneumothorax.  IMPRESSION: Stable edema.  Original Report Authenticated By: Donavan Burnet, M.D.   Dg Fluoro Guide Cv Line-no Report  12/31/2011  CLINICAL DATA: needed for dialysis placement   FLOURO GUIDE CV LINE  Fluoroscopy was utilized by the requesting physician.  No radiographic  interpretation.      Labs: BMET  Lab 01/01/12 0402 12/31/11 0440 12/30/11 0500 12/29/11 0500 12/28/11 0438 12/27/11 1027 12/27/11 1009 12/27/11 0430 12/26/11 1600  NA 136 136 133* 134* 135 135 137 -- --  K 4.2 4.8 3.6 4.1 3.5 3.5 3.5 -- --  CL 99 101 98 98 98 98 96 -- --  CO2 27 24 26 26 27  -- -- 26 22  GLUCOSE 93 217* 145* 240* 157* 218* 259* -- --  BUN 54* 64* 37* 30* 32* 30* 25* -- --  CREATININE 5.32* 6.10* 3.84* 3.25* 3.54* 3.10* 2.60* -- --  ALB -- -- -- -- -- -- -- -- --  CALCIUM 8.7  8.9 8.7 8.4 8.6 -- -- 8.2* 8.0*  PHOS 4.7* 3.7 2.9 2.9 2.5 -- -- 1.8* 1.8*   CBC  Lab 01/01/12 0402 12/31/11 0440 12/30/11 0500 12/29/11 0500  WBC 15.3* 15.6* 14.7* 20.4*  NEUTROABS -- -- -- --  HGB 7.1* 7.7* 8.1* 8.4*  HCT 22.4* 24.3* 25.4* 25.7*  MCV 89.6 89.0 88.5 87.7  PLT 350 379 326 363    Medications:      . antiseptic oral rinse  15 mL Mouth Rinse QID  . aspirin  81 mg Per Tube Daily  . atorvastatin  10 mg Oral q1800  . carvedilol  3.125 mg Oral BID WC  . cefTAZidime (FORTAZ)  IV  1 g Intravenous Q24H  . chlorhexidine  15 mL Mouth Rinse BID  . heparin  5,000  Units Subcutaneous Q8H  . insulin aspart  0-15 Units Subcutaneous Q4H  . insulin aspart  3 Units Subcutaneous Q4H  . insulin glargine  32 Units Subcutaneous Daily  . levofloxacin (LEVAQUIN) IV  250 mg Intravenous Q24H  . levothyroxine  50 mcg Oral QAC breakfast  . pantoprazole sodium  40 mg Per Tube Q1200  . vancomycin  1,000 mg Intravenous Once  . DISCONTD: feeding supplement (OSMOLITE 1.5 CAL)  1,000 mL Per Tube Q24H  . DISCONTD: feeding supplement  30 mL Per Tube QID  . DISCONTD: potassium chloride  40 mEq Per Tube Daily  . DISCONTD: sodium chloride  3 mL Intravenous Q12H     Assessment/ Plan:   1. AKI due to ATN from combination CINP and shock- Remains anuric and dialysis dependent without any evident renal recovery. Plan for dialysis today via tunneled HD catheter- anticipate will have prolonged RRT needs. 2. Volume excess/pulm edema- Attempt HD with ultrafiltration today and again tomorrow 3. Acute coronary syndrome with positive CE's and wall motion abnormalities on ECHO, EF improved on milrinone- Plans per cardiology 4. TYPE I DM- on lantus/novolog 5. Aspiration pneumonia with VDRF on full support- on levaquin 6. S/p fall with bilat LE tibial fracture- S/P IM pinning- Per ortho 7. Anemia: PRBCs likely indicated to improve hemodynamic status- will discuss with CCM on need for transfusion with HD  Zetta Bills, MD 01/01/2012, 7:39 AM

## 2012-01-01 NOTE — Progress Notes (Signed)
Clinical Social Worker met with mom at bedside, pt currently intubated and sedated.  Mom was pleasant and inquiring about playing music/sounds for pt to hear.  Mom currently appreciating the "ocean sounds" currently playing from pt's tv in room.  CSW provided emotional support and reviewed support system.  CSW to continue to follow and assist as needed.  Angelia Mould, MSW, [place] [phone]

## 2012-01-01 NOTE — Progress Notes (Signed)
Subjective:  Patient intubated.  Opens eyes to stimulation.. Chest xray today unchanged pulmonary vascular congestion. To have HD today with new HD catheter placed yesterday by Dr. Edilia Bo. BP stable off pressors. Rhythm NSR.  Objective:  Vital Signs in the last 24 hours: Temp:  [99.1 F (37.3 C)-100.5 F (38.1 C)] 100.5 F (38.1 C) (05/02 1600) Pulse Rate:  [76-111] 99  (05/03 0600) Resp:  [14-28] 15  (05/03 0345) BP: (91-141)/(42-87) 107/54 mmHg (05/03 0600) SpO2:  [95 %-100 %] 95 % (05/03 0600) FiO2 (%):  [30 %-60 %] 40 % (05/03 0345) Weight:  [81.8 kg (180 lb 5.4 oz)-85.8 kg (189 lb 2.5 oz)] 81.8 kg (180 lb 5.4 oz) (05/02 1401)  Intake/Output from previous day: 05/02 0701 - 05/03 0700 In: 1986.3 [I.V.:1356.3; NG/GT:330; IV Piggyback:300] Out: 3559 [Urine:45] Intake/Output from this shift:       . antiseptic oral rinse  15 mL Mouth Rinse QID  . aspirin  81 mg Per Tube Daily  . atorvastatin  10 mg Oral q1800  . carvedilol  3.125 mg Oral BID WC  . cefTAZidime (FORTAZ)  IV  1 g Intravenous Q24H  . chlorhexidine  15 mL Mouth Rinse BID  . heparin  5,000 Units Subcutaneous Q8H  . insulin aspart  0-15 Units Subcutaneous Q4H  . insulin aspart  3 Units Subcutaneous Q4H  . insulin glargine  32 Units Subcutaneous Daily  . levofloxacin (LEVAQUIN) IV  250 mg Intravenous Q24H  . levothyroxine  50 mcg Oral QAC breakfast  . pantoprazole sodium  40 mg Per Tube Q1200  . vancomycin  1,000 mg Intravenous Once  . DISCONTD: feeding supplement (OSMOLITE 1.5 CAL)  1,000 mL Per Tube Q24H  . DISCONTD: feeding supplement  30 mL Per Tube QID  . DISCONTD: potassium chloride  40 mEq Per Tube Daily  . DISCONTD: sodium chloride  3 mL Intravenous Q12H      . sodium chloride 2 mL/hr at 12/31/11 1622  . dextrose 20 mL/hr at 12/31/11 1622  . fentaNYL infusion INTRAVENOUS 300 mcg/hr (12/31/11 2022)  . midazolam (VERSED) infusion 10 mg/hr (01/01/12 0658)  . DISCONTD: phenylephrine  (NEO-SYNEPHRINE) Adult infusion Stopped (12/30/11 2207)    Physical Exam: The patient is intubated but awake and looking around. Chest: clearer anteriorly  Heart: No murmur gallop or rub. Abdomen: No apparent tenderness Extremities:Mild pedal edema.  Lab Results:  Basename 01/01/12 0402 12/31/11 0440  WBC 15.3* 15.6*  HGB 7.1* 7.7*  PLT 350 379    Basename 01/01/12 0402 12/31/11 0440  NA 136 136  K 4.2 4.8  CL 99 101  CO2 27 24  GLUCOSE 93 217*  BUN 54* 64*  CREATININE 5.32* 6.10*   No results found for this basename: TROPONINI:2,CK,MB:2 in the last 72 hours Hepatic Function Panel  Basename 01/01/12 0402 12/30/11 0500  PROT -- 5.6*  ALBUMIN 2.1* --  AST -- 42*  ALT -- 32  ALKPHOS -- 98  BILITOT -- 0.3  BILIDIR -- <0.1  IBILI -- NOT CALCULATED    Basename 12/31/11 0440  CHOL 152   No results found for this basename: PROTIME in the last 72 hours  Imaging: Imaging results have been reviewed. Chest xray shows mild CM and persistent bilateral infiltrates and mild congestion/edema Cardiac Studies: [facility]* *[facility]* [address] [place], [place] [address] [phone]  ------------------------------------------------------------ Transthoracic Echocardiography  Patient: Reginald, Gutierrez MR #: [id number] Study Date: 12/28/2011 Gender: M Age: 46 Height: 167.6cm  Weight: 88.8kg BSA: 2.46m^2 Pt. Status: Room: 2102  Kevan Ny, MD PERFORMING [facility] ATTENDING [place], [place] SONOGRAPHER [place], [employer] ADMITTING Rory Percy cc:  ------------------------------------------------------------ LV EF: 40% - 45%  ------------------------------------------------------------ Indications: Acute respiratory distress 518.82.  ------------------------------------------------------------ History: PMH: Myocardial infarction. Risk factors: Current tobacco use. Hypertension. Diabetes  mellitus. Dyslipidemia.  ------------------------------------------------------------ Study Conclusions  - Left ventricle: The cavity size was normal. Wall thickness was normal. Systolic function was mildly to moderately reduced. The estimated ejection fraction was in the range of 40% to 45%. There is hypokinesis of the mid-distal inferoposterior and apical myocardium. - Mitral valve: Mild regurgitation. - Left atrium: The atrium was mildly dilated. Transthoracic echocardiography. M-mode, complete 2D, spectral Doppler, and color Doppler. Height  Assessment/Plan:  Patient Active Hospital Problem List:  NSTEMI (non-ST elevated myocardial infarction) (12/19/2011)   Assessment: Rhythm stable NSR. Tolerating low dose carvedilol. Will continue at present dose for now since BP a little soft. Hgb down to 7.1.  May need transfusion soon.        LOS: 14 days    Cassell Clement 01/01/2012, 7:18 AM

## 2012-01-01 NOTE — Progress Notes (Signed)
tx initiated w/o complications and approx 1.5 hrs into tx vp up, bfr was decreased, pt was lying flat and tube feeding stop to rule out pressure on cath. Pressures improved for a few minutes. power flushed both hd cath ports and once again, pressures improved for a few min. Dr. Allena Katz aware and tx d/c with 58 min left of tx. Orders to TPA overnight and pt to get dialysis tomorrow.

## 2012-01-02 ENCOUNTER — Encounter (HOSPITAL_COMMUNITY): Payer: Self-pay | Admitting: Internal Medicine

## 2012-01-02 ENCOUNTER — Inpatient Hospital Stay (HOSPITAL_COMMUNITY): Payer: Medicare Other

## 2012-01-02 DIAGNOSIS — M79609 Pain in unspecified limb: Secondary | ICD-10-CM

## 2012-01-02 LAB — CARDIAC PANEL(CRET KIN+CKTOT+MB+TROPI)
CK, MB: 1.3 ng/mL (ref 0.3–4.0)
Relative Index: INVALID (ref 0.0–2.5)
Relative Index: INVALID (ref 0.0–2.5)
Troponin I: 0.74 ng/mL (ref <0.30)

## 2012-01-02 LAB — RENAL FUNCTION PANEL
CO2: 27 mEq/L (ref 19–32)
Calcium: 8.6 mg/dL (ref 8.4–10.5)
Creatinine, Ser: 4.81 mg/dL — ABNORMAL HIGH (ref 0.50–1.35)
GFR calc non Af Amer: 13 mL/min — ABNORMAL LOW (ref >90)

## 2012-01-02 LAB — GLUCOSE, CAPILLARY
Glucose-Capillary: 164 mg/dL — ABNORMAL HIGH (ref 70–99)
Glucose-Capillary: 57 mg/dL — ABNORMAL LOW (ref 70–99)
Glucose-Capillary: 61 mg/dL — ABNORMAL LOW (ref 70–99)
Glucose-Capillary: 167 mg/dL — ABNORMAL HIGH (ref 70–99)
Glucose-Capillary: 98 mg/dL (ref 70–99)
Glucose-Capillary: 177 mg/dL — ABNORMAL HIGH (ref 70–99)

## 2012-01-02 LAB — CBC
Hemoglobin: 7.6 g/dL — ABNORMAL LOW (ref 13.0–17.0)
MCH: 27.9 pg (ref 26.0–34.0)
MCHC: 31.5 g/dL (ref 30.0–36.0)

## 2012-01-02 LAB — TYPE AND SCREEN
ABO/RH(D): A POS
Antibody Screen: NEGATIVE

## 2012-01-02 MED ORDER — DARBEPOETIN ALFA-POLYSORBATE 100 MCG/0.5ML IJ SOLN
100.0000 ug | INTRAMUSCULAR | Status: DC
  Administered 2012-01-03: 100 ug via SUBCUTANEOUS
  Filled 2012-01-02 (×2): qty 0.5

## 2012-01-02 MED ORDER — HEPARIN SODIUM (PORCINE) 1000 UNIT/ML DIALYSIS
2000.0000 [IU] | INTRAMUSCULAR | Status: DC | PRN
  Administered 2012-01-03: 2000 [IU] via INTRAVENOUS_CENTRAL

## 2012-01-02 NOTE — Progress Notes (Signed)
Patient followed by our service since consultation on 4/20. Dr. Yevonne Pax note from 5/3 reviewed. He remains intubated with ventilator support and critically ill with multiple organ system failure (ARDS, PNA, ARF requiring HD, anemia requiring PRBCs, DM2). From cardiac perspective he has a cardiomyopathy with LVEF 40-45% with IPHK and NSTEMI being managed medically at this point. He is off pressors and current medications include ASA, Lipitor, Coreg, Heparin [place]. Rhythm has been sinus with occasional PVC's. Recent CXR shows improving edema/infiltrates. Labwork includes creatinine now 4.8, potassium 3.6, hemoglobin 7.6, and platelets 418.  We will follow with you. The possibility of a cardiac catheterization was mentioned in the CCM note from today, although the patient will need to make significant improvement before this can be considered. In particular, course and management of his renal failure with need to be solidified prior to considering an angiographic study.  Jonelle Sidle, M.D., F.A.C.C.

## 2012-01-02 NOTE — Progress Notes (Signed)
Noticed change in rhythm.  Patient found to be diaphoretic.  CBG checked 96.  12-Lead EKG done at bedside.  Changes in V5 and V6 but not in 3 contigious leads.  RT notified to return patient to rest mode on ventilator.  Dr. Herma Carson called and orders for Cardiac Enzymes serial set received.  Also, noted paged [facility].  Notified E-Link.  Will continue to monitor.

## 2012-01-02 NOTE — Progress Notes (Signed)
Critical Value Troponin called 0.94 - Ward Givens NP with [facility] Card[facility] notified.  Noted that patient with NSTEMI in April approximately 14 days ago.  Will continue to monitor EKG and Cardiac Enzymes - per orders.  Notified E-Link and faxed copies of 12 Lead EKGs.  Called RN in HD to advise of patient change in stability.  Will continue to monitor and evaluate.

## 2012-01-02 NOTE — Progress Notes (Signed)
Patient ID: Reginald Gutierrez, male   DOB: 11-05-1965, 46 y.o.   MRN: [id number]   [facility] Progress Note    Subjective:   No acute events overnight- dialysis limited by HD catheter pressure problems   Objective:   BP 107/47  Pulse 87  Temp(Src) 99.2 F (37.3 C) (Oral)  Resp 13  Ht 5\' 6"  (1.676 m)  Wt 79 kg (174 lb 2.6 oz)  BMI 28.11 kg/m2  SpO2 98%  Intake/Output Summary (Last 24 hours) at 01/02/12 0636 Last data filed at 01/02/12 0500  Gross per 24 hour  Intake 1776.35 ml  Output   1458 ml  Net 318.35 ml   Weight change: -3.2 kg (-7 lb 0.9 oz)  Physical Exam: OZH:YQMVHQIONGE on vent- opens eyes to touch and moves head to voice XBM:WUXLK RRR, normal s1 and s2  Resp:Anteriorly CTA bilaterally- no rales GMW:NUUV, obese, non-tender. BS normal. Ventral hernia reducible easily Ext:1+ LE edema, 1-2+ UE edema  Imaging: Dg Chest Port 1 View  01/01/2012  *RADIOLOGY REPORT*  Clinical Data: Pulmonary edema, pneumonia follow up.  PORTABLE CHEST - 1 VIEW  Comparison: 12/31/2011  Findings: Cardiomegaly.  Central vascular congestion and perihilar opacities.  Bibasilar opacities.  Probable small pleural effusions. Endotracheal tube tip projects 3 cm proximal the carina.  Right subclavian vas cath tips project over the proximal SVC.  No pneumothorax.  No acute osseous abnormality.  IMPRESSION:  Cardiomegaly with pulmonary edema pattern, similar to prior.  Small pleural effusions and bibasilar opacities; atelectasis versus infiltrate. Similar to prior.  Original Report Authenticated By: Waneta Martins, M.D.   Dg Chest Port 1 View  12/31/2011  *RADIOLOGY REPORT*  Clinical Data: Diatek catheter placement.  PORTABLE CHEST - 1 VIEW  Comparison: Earlier film, same date.  Findings: The right IJ diatek catheter tips are in good position. The distal port is at the cavoatrial junction and proximal port is in the distal SVC.  No pneumothorax.  The existing support apparatus is stable.   There is slight worsening lung aeration with a diffuse airspace process and lower lung volumes.  IMPRESSION: Right IJ diatek catheter tips in good position without complicating features.  Original Report Authenticated By: P. Loralie Champagne, M.D.   Dg Fluoro Guide Cv Line-no Report  12/31/2011  CLINICAL DATA: needed for dialysis placement   FLOURO GUIDE CV LINE  Fluoroscopy was utilized by the requesting physician.  No radiographic  interpretation.      Labs: BMET  Lab 01/02/12 0442 01/01/12 0402 12/31/11 0440 12/30/11 0500 12/29/11 0500 12/28/11 0438 12/27/11 1027 12/27/11 0430  NA 138 136 136 133* 134* 135 135 --  K 3.6 4.2 4.8 3.6 4.1 3.5 3.5 --  CL 101 99 101 98 98 98 98 --  CO2 27 27 24 26 26 27  -- 26  GLUCOSE 90 93 217* 145* 240* 157* 218* --  BUN 43* 54* 64* 37* 30* 32* 30* --  CREATININE 4.81* 5.32* 6.10* 3.84* 3.25* 3.54* 3.10* --  ALB -- -- -- -- -- -- -- --  CALCIUM 8.6 8.7 8.9 8.7 8.4 8.6 -- 8.2*  PHOS 4.5 4.7* 3.7 2.9 2.9 2.5 -- 1.8*   CBC  Lab 01/02/12 0441 01/01/12 1505 01/01/12 0402 12/31/11 0440  WBC 14.0* 18.3* 15.3* 15.6*  NEUTROABS -- -- -- --  HGB 7.6* 8.2* 7.1* 7.7*  HCT 24.1* 25.7* 22.4* 24.3*  MCV 88.6 88.3 89.6 89.0  PLT 418* 394 350 379    Medications:      .  alteplase  6 mg Intracatheter Once  . antiseptic oral rinse  15 mL Mouth Rinse QID  . aspirin  81 mg Per Tube Daily  . atorvastatin  10 mg Oral q1800  . carvedilol  3.125 mg Oral BID WC  . cefTAZidime (FORTAZ)  IV  1 g Intravenous Q24H  . chlorhexidine  15 mL Mouth Rinse BID  . dextrose      . feeding supplement  30 mL Per Tube Q6H  . heparin  2,000 Units Dialysis Once in dialysis  . heparin  5,000 Units Subcutaneous Q8H  . insulin aspart  0-15 Units Subcutaneous Q4H  . insulin aspart  3 Units Subcutaneous Q4H  . insulin glargine  32 Units Subcutaneous Daily  . levothyroxine  50 mcg Oral QAC breakfast  . pantoprazole sodium  40 mg Per Tube Q1200  . DISCONTD: heparin  2,000 Units Dialysis  Once in dialysis     Assessment/ Plan:   1. AKI due to ATN from combination CINP and shock- Remains anuric and dialysis dependent without any evident renal recovery. Dialysis yesterday impaired by HD catheter pressures- plan retry today (currently, the goal for daily HD is fluid management). Based on the available information and limited recovery so far- anticipate he will be dialysis dependent for several weeks. 2. Volume excess/pulm edema- Attempt HD with ultrafiltration today and again possibly tomorrow 3. Acute coronary syndrome clinically stable- ongoing management of CHF: on carvedill 4. TYPE I DM- on lantus/novolog 5. Aspiration pneumonia with VDRF on full support- planned tracheostomy after revisiting GOC (meeting today with sister/mother) 6. S/p fall with bilat LE tibial fracture- S/P IM pinning- Per ortho 7. Anemia: PRBCs for transfusion per CCM- will start ESA  Zetta Bills, MD 01/02/2012, 6:36 AM

## 2012-01-02 NOTE — Consult Note (Signed)
Consult Note from the Palliative Medicine Team at [facility] Patient ZO:XWRUE J Gutierrez      DOB: Jan 11, 1966      AVW:[id number]   Consult Requested by: Dr. Craige Cotta    PCP: Lehman Prom, NP, NP Reason for Consultation: GOC     Phone Number:[phone] And Symptom Recommendations Assessment and Plan: 46 year old white with past medical history for Type I diabetes, recoverying from polysubstance abuse ( 4-5 yrs), depression, hyperlipidemia, hypothyroid, hypertension.  Presented to hospital after waking up to find EMS around him .  He evidently was working at his home when he became light headed.  BS on arrival was in the 40's .  He sustained bilateral tibia fractures with an unclear mechanism as he states he was walking into the house to get something for his sugar.  His fractures were repaired but he subsequently developed respiratory distress requiring intubation for an ARDS like picture and subsequently ruled in for NSTEMI, and worsening renal failure requiring dialysis.  He was found to have a deteriorated EF of 40%  At this time, he remains ventilated but tolerating some weaning, and is scheduled for a trach on Monday.  I met with his mother and sister both of whom are nurses. The Following reflects GOC as they are at this time.  His family is ready to consider changes day to day as events unfold. 1. Code Status: Limited Code status remains: NO CPR, NO Defibrillation, Use of antiarrhythmics for NON ACLS arrythmia desired ,  Vasopressors are desired. 2. Symptom Control: 1. Pain:  S/p surgery on both tibia.  Currently on Fentanyl and Versed which is covering any pain 2. Dyspnea: Vented and going for trach on Monday.  Family desire to proceed 3. Inability to Eat:  Family would proceed with PEG at this time unless something changes to cause [place] to re evaluate that status. 4. NSTEMI:  Being followed by Cards 3. Psycho/Social: The patient has never been married and has no children.  His sister Baxter Hire and Mom  are making decisions on his behalf.  They are wrapping their minds around Reginald Gutierrez's current situation are making reasonable decisions within the context of his current status. 4. Spiritual:  Marvie is described as "loving the Lord;saved" but not a Development worker, community.  Chaplain services are offered. 5. Disposition:  To be determined .  Overall,  The patient's mom would never want him just to "be a vegetable in a nursing home".  Currently, he is unable to participate in goals of Care.  She would be open to [facility].  Will continue to work with this family .  Patient Documents Completed or Given: Document Given Completed  Advanced Directives Pkt    MOST    DNR    Gone from My Sight    Hard Choices      Brief HPI: Patient evidently had a hypoglycemic event and in the process of passing out managed to break both his legs. He was admitted to the hospital and had legs repaired but subsequently had NSTEMI and developed worsening renal failure requiring dialysis and vent dependent respiratory failure   ROS: unable due to intubated and sedated    PMH:  Past Medical History  Diagnosis Date  . Diabetes mellitus type 1   . Hyperlipidemia   . Hypothyroidism   . Depression   . Umbilical hernia   . Hypertension      PSH: Past Surgical History  Procedure Date  . Appendectomy     open  .  Tibia im nail insertion 12/18/2011    Procedure: INTRAMEDULLARY (IM) NAIL TIBIAL;  Surgeon: Eugenia Mcalpine, MD;  Location: [facility];  Service: Orthopedics;  Laterality: Bilateral;   I have reviewed the FH and SH and  If appropriate update it with new information. No Known Allergies Scheduled Meds:   . alteplase  6 mg Intracatheter Once  . antiseptic oral rinse  15 mL Mouth Rinse QID  . aspirin  81 mg Per Tube Daily  . atorvastatin  10 mg Oral q1800  . carvedilol  3.125 mg Oral BID WC  . cefTAZidime (FORTAZ)  IV  1 g Intravenous Q24H  . chlorhexidine  15 mL Mouth Rinse BID  . darbepoetin (ARANESP) injection  - DIALYSIS  100 mcg Subcutaneous Weekly  . dextrose      . feeding supplement  30 mL Per Tube Q6H  . heparin  2,000 Units Dialysis Once in dialysis  . heparin  5,000 Units Subcutaneous Q8H  . insulin aspart  0-15 Units Subcutaneous Q4H  . insulin aspart  3 Units Subcutaneous Q4H  . insulin glargine  32 Units Subcutaneous Daily  . levothyroxine  50 mcg Oral QAC breakfast  . pantoprazole sodium  40 mg Per Tube Q1200   Continuous Infusions:   . sodium chloride 2 mL/hr at 12/31/11 1622  . feeding supplement (OSMOLITE 1.5 CAL) 1,000 mL (01/01/12 1658)  . fentaNYL infusion INTRAVENOUS 300 mcg/hr (01/01/12 1556)  . midazolam (VERSED) infusion 10 mg/hr (01/02/12 0021)   PRN Meds:.acetaminophen (TYLENOL) oral liquid 160 mg/5 mL, fentaNYL, heparin, heparin, heparin, midazolam, ondansetron (ZOFRAN) IV    BP 88/42  Pulse 89  Temp(Src) 99.2 F (37.3 C) (Core (Comment))  Resp 21  Ht 5\' 6"  (1.676 m)  Wt 79 kg (174 lb 2.6 oz)  BMI 28.11 kg/m2  SpO2 95%   PPS: Intubated  Sedated 10%   Intake/Output Summary (Last 24 hours) at 01/02/12 1137 Last data filed at 01/02/12 1000  Gross per 24 hour  Intake 1628.07 ml  Output   1609 ml  Net  19.07 ml   LBM: 5/4                       Stool Softner: None will monitor  Physical Exam:  General: No acute sedated on the vent HEENT:  Pupils not examined pat becomes very agitated per nursing with too much stimulation and has be weaning well , will not disrupt the balance. Chest: decreased with some coarse breath sounds at the bases CVS: regular, rate and rhythm at present has been tachy, S1, S2, I-II/VI SEM Abdomen: obese, mildly distended, positive bowel sounds Ext: warm, no mottling , edema trace to 1+ Neuro: sedated Labs: CBC    Component Value Date/Time   WBC 14.0* 01/02/2012 0441   RBC 2.72* 01/02/2012 0441   HGB 7.6* 01/02/2012 0441   HCT 24.1* 01/02/2012 0441   PLT 418* 01/02/2012 0441   MCV 88.6 01/02/2012 0441   MCH 27.9 01/02/2012 0441    MCHC 31.5 01/02/2012 0441   RDW 15.9* 01/02/2012 0441   LYMPHSABS 1.7 12/21/2011 0410   MONOABS 1.2* 12/21/2011 0410   EOSABS 0.2 12/21/2011 0410   BASOSABS 0.0 12/21/2011 0410     CMP     Component Value Date/Time   NA 138 01/02/2012 0442   K 3.6 01/02/2012 0442   CL 101 01/02/2012 0442   CO2 27 01/02/2012 0442   GLUCOSE 90 01/02/2012 0442   BUN 43* 01/02/2012 0442  CREATININE 4.81* 01/02/2012 0442   CALCIUM 8.6 01/02/2012 0442   PROT 5.6* 12/30/2011 0500   ALBUMIN 2.1* 01/02/2012 0442   AST 42* 12/30/2011 0500   ALT 32 12/30/2011 0500   ALKPHOS 98 12/30/2011 0500   BILITOT 0.3 12/30/2011 0500   GFRNONAA 13* 01/02/2012 0442   GFRAA 15* 01/02/2012 0442    Chest Xray Reviewed/Impressions: improvement in bilateral infiltrates and edema     Time In Time Out Total Time Spent with Patient Total Overall Time  1200 100 pm 20 min 60 min    Greater than 50%  of this time was spent counseling and coordinating care related to the above assessment and plan.   Parthenia Tellefsen L. Ladona Ridgel, MD MBA The Palliative Medicine Team at [facility] Phone: [phone] Pager: [phone]

## 2012-01-02 NOTE — Progress Notes (Addendum)
Name: Reginald Gutierrez MRN: [id number] DOB: 08-04-1966    LOS: 15  PCCM PROGRESS NOTE  History of Present Illness: 46 y/o with history of DM and cocaine abuse admitted with bilateral tibia fracture s/p syncopal episode on 4/19.  Postoperatively he developed acute hypoxia and refractory shock, transient ST depression and troponin leak.  Course is complicated by ARDS requiring HFOV and acute renal failure requiring CVVHD.  Lines / Drains: 4/20 OETT >>> 4/20 R IJ CVL >>> 4/20 R fem A-line >>>5/2 4/23 L IJ HD>>>Changed 4/30>>5/2 (thrombosed) 5/2   Rt IJ Diatek cath (Dickson)>>  Cultures: 4/20  Blood >>>negative 4/20  Sputum >>>negative 4/23  Blood >>> negative 4/23  Urine >>>negative 4/29  Sputum>>>negative 4/29  Urine>>>negative 4/29  Blood>>>  Abx: Zosyn 4/20 (empirical, G-, anaerobes) >>>4/26 Vanc 4/20 (empirical, MRSA) >>>4/26 Vanc restart 4/29>>> Levaquin 4/24 (empirical, atypical) >>>5/3 Ceftazidime Elita Quick) 4/29>>>  Tests / Events: 4/19  Admission, repair of bilat tibia Fx 4/20  Refractory shock, hypoxemic resp failure, NSTEMI.  Intubated 4/20  CT head >>> NAD 4/20  TTE >>> EF 25 to 30%, akinesis of mid/distal anteroseptal and apical area 4/21  Chest CT angio >>> No central pulmonary embolism. Slight progression of right greater than left airspace disease. 4/23  Renal US >>> no hydronephrosis, no urine obstruction 4/23  HFOV due to refractory hypoxemia.  Vasopressors.  CVVHD. 4/24  Off HFOV 4/29  Transfused 1 U blood 5/3    Tranfuse 1 unit PRBC  Subjective/interval: Hemodynamically stable overnight.  Intermittent agitation.  Vital Signs: Temp:  [98.2 F (36.8 C)-99.8 F (37.7 C)] 98.2 F (36.8 C) (05/04 0600) Pulse Rate:  [76-103] 87  (05/04 0600) Resp:  [13-23] 13  (05/03 2200) BP: (88-131)/(38-68) 106/47 mmHg (05/04 0600) SpO2:  [95 %-100 %] 95 % (05/04 0600) FiO2 (%):  [30 %-40 %] 30 % (05/04 0352) Weight:  [79 kg (174 lb 2.6 oz)-82.6 kg (182 lb 1.6 oz)] 79  kg (174 lb 2.6 oz) (05/03 1846) I/O last 3 completed shifts: In: 2506.4 [I.V.:1931.4; Blood:15; NG/GT:510; IV Piggyback:50] Out: 1458 [Other:1458]  Physical Examination: General - Comfortable, synchronous HEENT - ETT / OGT Cardiac - Regular, no murmurs Chest - Bilateral air entry, few rhonchi Abd - Soft, nontener Ext - Bilateral pitting edema Neuro - Sedated, intermittently agitated, opens eyes to command  Ventilator settings: Vent Mode:  [-] PRVC FiO2 (%):  [30 %-40 %] 30 % Set Rate:  [14 bmp] 14 bmp Vt Set:  [520 mL-5220 mL] 520 mL PEEP:  [5 cmH20] 5 cmH20 Pressure Support:  [5 cmH20] 5 cmH20 Plateau Pressure:  [21 cmH20-32 cmH20] 24 cmH20  CBC    Component Value Date/Time   WBC 14.0* 01/02/2012 0441   RBC 2.72* 01/02/2012 0441   HGB 7.6* 01/02/2012 0441   HCT 24.1* 01/02/2012 0441   PLT 418* 01/02/2012 0441   MCV 88.6 01/02/2012 0441   MCH 27.9 01/02/2012 0441   MCHC 31.5 01/02/2012 0441   RDW 15.9* 01/02/2012 0441   LYMPHSABS 1.7 12/21/2011 0410   MONOABS 1.2* 12/21/2011 0410   EOSABS 0.2 12/21/2011 0410   BASOSABS 0.0 12/21/2011 0410   BMET    Component Value Date/Time   NA 138 01/02/2012 0442   K 3.6 01/02/2012 0442   CL 101 01/02/2012 0442   CO2 27 01/02/2012 0442   GLUCOSE 90 01/02/2012 0442   BUN 43* 01/02/2012 0442   CREATININE 4.81* 01/02/2012 0442   CALCIUM 8.6 01/02/2012 0442   GFRNONAA 13* 01/02/2012 0442  GFRAA 15* 01/02/2012 0442   Dg Chest Port 1 View  01/01/2012  *RADIOLOGY REPORT*  Clinical Data: Pulmonary edema, pneumonia follow up.  PORTABLE CHEST - 1 VIEW  Comparison: 12/31/2011  Findings: Cardiomegaly.  Central vascular congestion and perihilar opacities.  Bibasilar opacities.  Probable small pleural effusions. Endotracheal tube tip projects 3 cm proximal the carina.  Right subclavian vas cath tips project over the proximal SVC.  No pneumothorax.  No acute osseous abnormality.  IMPRESSION:  Cardiomegaly with pulmonary edema pattern, similar to prior.  Small pleural effusions and  bibasilar opacities; atelectasis versus infiltrate. Similar to prior.  Original Report Authenticated By: Waneta Martins, M.D.   Dg Chest Port 1 View  12/31/2011  *RADIOLOGY REPORT*  Clinical Data: Diatek catheter placement.  PORTABLE CHEST - 1 VIEW  Comparison: Earlier film, same date.  Findings: The right IJ diatek catheter tips are in good position. The distal port is at the cavoatrial junction and proximal port is in the distal SVC.  No pneumothorax.  The existing support apparatus is stable.  There is slight worsening lung aeration with a diffuse airspace process and lower lung volumes.  IMPRESSION: Right IJ diatek catheter tips in good position without complicating features.  Original Report Authenticated By: P. Loralie Champagne, M.D.   Dg Fluoro Guide Cv Line-no Report  12/31/2011  CLINICAL DATA: needed for dialysis placement   FLOURO GUIDE CV LINE  Fluoroscopy was utilized by the requesting physician.  No radiographic  interpretation.     ASSESSMENT AND PLAN  Acute respiratory failure secondary to PNA, ARDS, acute pulmonary edema, and possible fat embolism.  Respiratory mechanics and oxygenation improving with change in Abx 4/29, and fluid removal with HD. -SBT -percutaneous trach on Monday -f/u CXR  Shock likely from acute systolic CHF with cardiogenic shock, and sepsis>>resolved  Acute systolic CHF in setting of NSTEMI. -continue ASA -coreg, lipitor started 5/2 -no ACE due to renal failure -will need cardiac cath before discharge / transfer.  Should address with cardiology on Monday.  Hospital acquired pneumonia with new fever, infiltrate 4/28. -D10/10 levaquin -D5/10 vancomycin, fortaz>>narrow abx once blood cultures final  Acute metabolic encephalopathy.  Has hx of cocaine abuse, but UDS on admit negative. -sedation protocol while on vent -RASS goal -1 -daily WUA  Acute renal failure from ATN with shock/hypoxia, IV contrast load, and DM. -transitioned to HD 4/28 -fluid  removal per renal -f/u renal panel  Protein calorie malnutrition. -tube feeds changed 5/1 to osmolite 1.5 @40  ml/hr with 30 ml prostat qid to limit fluid intake  Anemia of critical illness and chronic disease. -f/u CBC intermittently -transfuse for Hb < 7 -Aranesp  IDDM. -SSI with lantus  Hypothyroidism. -resumed low dose synthroid 4/30  B/L tibial fractures s/p repair. -per ortho -will need gentle ROM with PT  Best practice. -protonix for SUP -SQ heparin for DVT proph  The patient is critically ill with multiple organ systems failure and requires high complexity decision making for assessment and support, frequent evaluation and titration of therapies, application of advanced monitoring technologies and extensive interpretation of multiple databases. Critical Care Time devoted to patient care services described in this note is 35 minutes.  Orlean Bradford, M.D., F.C.C.P. Pulmonary and Critical Care Medicine [facility] Cell: [phone] Pager: [phone]

## 2012-01-03 ENCOUNTER — Inpatient Hospital Stay (HOSPITAL_COMMUNITY): Payer: Medicare Other

## 2012-01-03 LAB — POCT I-STAT 3, ART BLOOD GAS (G3+)
pCO2 arterial: 44.4 mmHg (ref 35.0–45.0)
pH, Arterial: 7.394 (ref 7.350–7.450)
pO2, Arterial: 85 mmHg (ref 80.0–100.0)

## 2012-01-03 LAB — RENAL FUNCTION PANEL
Albumin: 2.1 g/dL — ABNORMAL LOW (ref 3.5–5.2)
BUN: 69 mg/dL — ABNORMAL HIGH (ref 6–23)
CO2: 25 mEq/L (ref 19–32)
CO2: 28 mEq/L (ref 19–32)
Chloride: 99 mEq/L (ref 96–112)
Chloride: 100 mEq/L (ref 96–112)
GFR calc Af Amer: 10 mL/min — ABNORMAL LOW (ref >90)
GFR calc Af Amer: 21 mL/min — ABNORMAL LOW (ref >90)
GFR calc non Af Amer: 18 mL/min — ABNORMAL LOW (ref >90)
Glucose, Bld: 156 mg/dL — ABNORMAL HIGH (ref 70–99)
Potassium: 4 mEq/L (ref 3.5–5.1)
Potassium: 3.7 mEq/L (ref 3.5–5.1)
Sodium: 136 mEq/L (ref 135–145)

## 2012-01-03 LAB — CULTURE, BLOOD (ROUTINE X 2): Culture: NO GROWTH

## 2012-01-03 LAB — GLUCOSE, CAPILLARY
Glucose-Capillary: 141 mg/dL — ABNORMAL HIGH (ref 70–99)
Glucose-Capillary: 160 mg/dL — ABNORMAL HIGH (ref 70–99)
Glucose-Capillary: 172 mg/dL — ABNORMAL HIGH (ref 70–99)
Glucose-Capillary: 218 mg/dL — ABNORMAL HIGH (ref 70–99)

## 2012-01-03 LAB — CBC
HCT: 22.8 % — ABNORMAL LOW (ref 39.0–52.0)
Hemoglobin: 7.3 g/dL — ABNORMAL LOW (ref 13.0–17.0)
MCHC: 32 g/dL (ref 30.0–36.0)
RBC: 2.59 MIL/uL — ABNORMAL LOW (ref 4.22–5.81)
WBC: 15.5 10*3/uL — ABNORMAL HIGH (ref 4.0–10.5)

## 2012-01-03 LAB — CARDIAC PANEL(CRET KIN+CKTOT+MB+TROPI): CK, MB: 1.6 ng/mL (ref 0.3–4.0)

## 2012-01-03 MED ORDER — HEPARIN SODIUM (PORCINE) 1000 UNIT/ML DIALYSIS: 2000.0000 [IU] | INTRAMUSCULAR | Status: DC | PRN

## 2012-01-03 MED ORDER — SODIUM CHLORIDE 0.9 % IV SOLN
0.4000 ug/kg/h | INTRAVENOUS | Status: DC
  Administered 2012-01-03: 0.4 ug/kg/h via INTRAVENOUS
  Administered 2012-01-03 (×2): 1.2 ug/kg/h via INTRAVENOUS
  Filled 2012-01-03 (×3): qty 2

## 2012-01-03 MED ORDER — FENTANYL CITRATE 0.05 MG/ML IJ SOLN: 200.0000 ug | Freq: Once | INTRAMUSCULAR | Status: DC

## 2012-01-03 MED ORDER — FENTANYL BOLUS VIA INFUSION
50.0000 ug | Freq: Four times a day (QID) | INTRAVENOUS | Status: DC | PRN
  Administered 2012-01-05: 100 ug via INTRAVENOUS
  Filled 2012-01-03 (×2): qty 100

## 2012-01-03 MED ORDER — FENTANYL CITRATE 0.05 MG/ML IJ SOLN
50.0000 ug | INTRAMUSCULAR | Status: DC | PRN
  Administered 2012-01-03: 50 ug via INTRAVENOUS
  Filled 2012-01-03: qty 2

## 2012-01-03 MED ORDER — SODIUM CHLORIDE 0.9 % IV SOLN
50.0000 ug/h | INTRAVENOUS | Status: DC
  Administered 2012-01-03: 400 ug/h via INTRAVENOUS
  Administered 2012-01-04 (×2): 300 ug/h via INTRAVENOUS
  Administered 2012-01-05: 200 ug/h via INTRAVENOUS
  Administered 2012-01-05: 300 ug/h via INTRAVENOUS
  Administered 2012-01-05: 250 ug/h via INTRAVENOUS
  Filled 2012-01-03 (×8): qty 50

## 2012-01-03 MED ORDER — VANCOMYCIN HCL 500 MG IV SOLR
500.0000 mg | Freq: Once | INTRAVENOUS | Status: AC
  Administered 2012-01-03: 500 mg via INTRAVENOUS
  Filled 2012-01-03: qty 500

## 2012-01-03 MED ORDER — CARVEDILOL 6.25 MG PO TABS
6.2500 mg | ORAL_TABLET | Freq: Two times a day (BID) | ORAL | Status: DC
  Administered 2012-01-03 (×2): 6.25 mg via ORAL
  Filled 2012-01-03 (×4): qty 1

## 2012-01-03 MED ORDER — ETOMIDATE 2 MG/ML IV SOLN
40.0000 mg | Freq: Once | INTRAVENOUS | Status: DC
  Filled 2012-01-03: qty 20

## 2012-01-03 MED ORDER — MIDAZOLAM BOLUS VIA INFUSION
1.0000 mg | INTRAVENOUS | Status: DC | PRN
  Administered 2012-01-05: 2 mg via INTRAVENOUS
  Filled 2012-01-03: qty 2

## 2012-01-03 MED ORDER — VECURONIUM BROMIDE 10 MG IV SOLR: 10.0000 mg | Freq: Once | INTRAVENOUS | Status: DC

## 2012-01-03 MED ORDER — HALOPERIDOL LACTATE 5 MG/ML IJ SOLN
INTRAMUSCULAR | Status: AC
  Administered 2012-01-03: 5 mg via INTRAVENOUS
  Filled 2012-01-03: qty 1

## 2012-01-03 MED ORDER — MIDAZOLAM HCL 2 MG/2ML IJ SOLN: 5.0000 mg | Freq: Once | INTRAMUSCULAR | Status: DC

## 2012-01-03 MED ORDER — SODIUM CHLORIDE 0.9 % IV SOLN
0.4000 ug/kg/h | INTRAVENOUS | Status: DC
  Administered 2012-01-03: 1.2 ug/kg/h via INTRAVENOUS
  Filled 2012-01-03: qty 4

## 2012-01-03 MED ORDER — SODIUM CHLORIDE 0.9 % IV SOLN
2.0000 mg/h | INTRAVENOUS | Status: DC
  Administered 2012-01-03: 10 mg/h via INTRAVENOUS
  Administered 2012-01-04 (×2): 9 mg/h via INTRAVENOUS
  Administered 2012-01-04: 6 mg/h via INTRAVENOUS
  Administered 2012-01-05 (×2): 10 mg/h via INTRAVENOUS
  Administered 2012-01-05: 4 mg/h via INTRAVENOUS
  Administered 2012-01-05: 10 mg/h via INTRAVENOUS
  Administered 2012-01-06: 4 mg/h via INTRAVENOUS
  Filled 2012-01-03 (×12): qty 10

## 2012-01-03 MED ORDER — NYSTATIN 100000 UNIT/GM EX POWD
Freq: Two times a day (BID) | CUTANEOUS | Status: DC
  Administered 2012-01-03 – 2012-01-07 (×9): via TOPICAL
  Filled 2012-01-03 (×2): qty 15

## 2012-01-03 MED ORDER — HALOPERIDOL LACTATE 5 MG/ML IJ SOLN
5.0000 mg | Freq: Once | INTRAMUSCULAR | Status: AC
  Administered 2012-01-03: 5 mg via INTRAVENOUS

## 2012-01-03 MED ORDER — MIDAZOLAM HCL 2 MG/2ML IJ SOLN
2.0000 mg | INTRAMUSCULAR | Status: DC | PRN
  Administered 2012-01-03: 2 mg via INTRAVENOUS
  Filled 2012-01-03: qty 2

## 2012-01-03 NOTE — Progress Notes (Signed)
Patient ID: SAIM ALMANZA, male   DOB: 25-Nov-1965, 46 y.o.   MRN: [id number]   [facility] Progress Note    Subjective:   Tolerated HD well earlier today with post (bed) weight of 80.9Kg. Palliative care note reviewed. Sedated this AM due to agitation and attempts at getting out of bed.   Objective:   BP 114/47  Pulse 91  Temp(Src) 100.2 F (37.9 C) (Rectal)  Resp 19  Ht 5\' 6"  (1.676 m)  Wt 86.2 kg (190 lb 0.6 oz)  BMI 30.67 kg/m2  SpO2 96%  Intake/Output Summary (Last 24 hours) at 01/03/12 0756 Last data filed at 01/03/12 4098  Gross per 24 hour  Intake   2220 ml  Output   2302 ml  Net    -82 ml   Weight change: 3.6 kg (7 lb 15 oz)  Physical Exam: JXB:[id number] on Vent  YQM:VHQIO RRR- normal S1 and S2 Resp:Coarse BS bilaterally, no rales NGE:XBMW, Obese, NT, Bs normal Ext:1+ LE edema with skin wrinkling- 1+ UE edema (reducing)  Imaging: Dg Chest Port 1 View  01/02/2012  *RADIOLOGY REPORT*  Clinical Data: CHF, pneumonia  PORTABLE CHEST - 1 VIEW  Comparison:   the previous day's study  Findings: Nasogastric tube, endotracheal tube, and right IJ hemodialysis catheter are stable in position.  Some interval improvement in the perihilar and bibasilar interstitial and airspace edema or infiltrates, with mild residual.  No effusion. Heart size upper limits normal as before.  IMPRESSION:  1.  Partial improvement in bilateral edema or infiltrates. 2. Support hardware stable in position.  Original Report Authenticated By: Osa Craver, M.D.    Labs: BMET  Lab 01/03/12 4132 01/02/12 4401 01/01/12 0402 12/31/11 0440 12/30/11 0500 12/29/11 0500 12/28/11 0438  NA 136 138 136 136 133* 134* 135  K 4.0 3.6 4.2 4.8 3.6 4.1 3.5  CL 99 101 99 101 98 98 98  CO2 25 27 27 24 26 26 27   GLUCOSE 156* 90 93 217* 145* 240* 157*  BUN 69* 43* 54* 64* 37* 30* 32*  CREATININE 6.79* 4.81* 5.32* 6.10* 3.84* 3.25* 3.54*  ALB -- -- -- -- -- -- --  CALCIUM 8.5 8.6 8.7 8.9 8.7  8.4 8.6  PHOS 6.1* 4.5 4.7* 3.7 2.9 2.9 2.5   CBC  Lab 01/03/12 0355 01/02/12 0441 01/01/12 1505 01/01/12 0402  WBC 15.5* 14.0* 18.3* 15.3*  NEUTROABS -- -- -- --  HGB 7.3* 7.6* 8.2* 7.1*  HCT 22.8* 24.1* 25.7* 22.4*  MCV 88.0 88.6 88.3 89.6  PLT 526* 418* 394 350    Medications:      . antiseptic oral rinse  15 mL Mouth Rinse QID  . aspirin  81 mg Per Tube Daily  . atorvastatin  10 mg Oral q1800  . carvedilol  3.125 mg Oral BID WC  . cefTAZidime (FORTAZ)  IV  1 g Intravenous Q24H  . chlorhexidine  15 mL Mouth Rinse BID  . darbepoetin (ARANESP) injection - DIALYSIS  100 mcg Subcutaneous Weekly  . feeding supplement  30 mL Per Tube Q6H  . heparin  2,000 Units Dialysis Once in dialysis  . heparin  5,000 Units Subcutaneous Q8H  . insulin aspart  0-15 Units Subcutaneous Q4H  . insulin aspart  3 Units Subcutaneous Q4H  . insulin glargine  32 Units Subcutaneous Daily  . levothyroxine  50 mcg Oral QAC breakfast  . pantoprazole sodium  40 mg Per Tube Q1200     Assessment/ Plan:  1. AKI due to ATN from combination CINP and shock- Remains oliguric and dialysis dependent with some improvement in UOP overnight. Plan for HD tomorrow for volume management and assist in allowing weaning from vent- possibly trach soon?. 2. Volume excess/pulm edema- Attempt HD with ultrafiltration again tomorrow 3. Acute coronary syndrome- lateral EKG changes noted with rising TnI- ongoing management per cardiology: on carvedilol 4. TYPE I DM- on lantus/novolog 5. Aspiration pneumonia with VDRF on full support- planned tracheostomy after revisiting GOC (meeting yesterday with sister/mother) 6. S/p fall with bilat LE tibial fracture- S/P IM pinning- Per ortho 7. Anemia: on ESA- plan for PRBCs if hemodynamically unstable or Hgb<7  Zetta Bills, MD 01/03/2012, 7:56 AM

## 2012-01-03 NOTE — Progress Notes (Signed)
eLink Physician-Brief Progress Note Patient Name: Reginald Gutierrez DOB: July 30, 1966 MRN: [id number]  Date of Service  01/03/2012   HPI/Events of Note  Ongoing diarrhea with rectal pouch leaking and coming off   eICU Interventions  Plan: Order for flexiseal   Intervention Category Minor Interventions: Routine modifications to care plan (e.g. PRN medications for pain, fever)  Manvir Prabhu 01/03/2012, 11:21 PM

## 2012-01-03 NOTE — Progress Notes (Signed)
   Primary cardiologist: Dr. Patty Sermons  Subjective: Patient sedated on ventilator.   Objective: Temp:  [96.9 F (36.1 C)-100.2 F (37.9 C)] 100.2 F (37.9 C) (05/05 0726) Pulse Rate:  [81-113] 94  (05/05 0745) Resp:  [13-21] 16  (05/05 0745) BP: (80-128)/(38-62) 96/42 mmHg (05/05 0745) SpO2:  [91 %-100 %] 94 % (05/05 0745) FiO2 (%):  [30 %] 30 % (05/05 0745) Weight:  [190 lb 0.6 oz (86.2 kg)] 190 lb 0.6 oz (86.2 kg) (05/05 0330)  I/O last 3 completed shifts: In: 2990 [I.V.:1450; NG/GT:1490; IV Piggyback:50] Out: 302 [Urine:301; Stool:1]  Telemetry - SInus rhythm, occasional PVC, no VT.  Exam -   General - Appears comfortable on ventilator.  Lungs - Few scattered rhonchi.  Cardiac - RRR, no rub or S3 evident.  Abdomen - Soft, diminished BS.  Extremities - Edema noted.  Testing -   Lab Results  Component Value Date   WBC 15.5* 01/03/2012   HGB 7.3* 01/03/2012   HCT 22.8* 01/03/2012   MCV 88.0 01/03/2012   PLT 526* 01/03/2012    Lab Results  Component Value Date   CREATININE 6.79* 01/03/2012   BUN 69* 01/03/2012   NA 136 01/03/2012   K 4.0 01/03/2012   CL 99 01/03/2012   CO2 25 01/03/2012    Lab Results  Component Value Date   CKTOTAL 20 01/03/2012   CKMB 1.6 01/03/2012   TROPONINI 0.70* 01/03/2012    ECG - Recent tracings reviewed with variable ST depression in anterolateral leads noted.  Current Medications    . antiseptic oral rinse  15 mL Mouth Rinse QID  . aspirin  81 mg Per Tube Daily  . atorvastatin  10 mg Oral q1800  . carvedilol  3.125 mg Oral BID WC  . cefTAZidime (FORTAZ)  IV  1 g Intravenous Q24H  . chlorhexidine  15 mL Mouth Rinse BID  . darbepoetin (ARANESP) injection - DIALYSIS  100 mcg Subcutaneous Weekly  . feeding supplement  30 mL Per Tube Q6H  . heparin  2,000 Units Dialysis Once in dialysis  . heparin  5,000 Units Subcutaneous Q8H  . insulin aspart  0-15 Units Subcutaneous Q4H  . insulin aspart  3 Units Subcutaneous Q4H  . insulin glargine  32  Units Subcutaneous Daily  . levothyroxine  50 mcg Oral QAC breakfast  . pantoprazole sodium  40 mg Per Tube Q1200    Assessment:  1. NSTEMI, on ASA, Heparin [place], Coreg, statin. Further ischemic workup (cardiac catheterization) depending largely on his clinical progress with multiorgan failure. Recent tracings reviewed. Troponin trend could still be coming down from the initial event (it was > 25).  2. Acute systolic CHF, LVEF 40-45% with IPHK.  Plan:  We continue to follow with you. Continue medical therapy and support with multiorgan failure. Try to advance Coreg to 6.25 mg BID if BP allows. ECG AM.  Jonelle Sidle, M.D., F.A.C.C.

## 2012-01-03 NOTE — Progress Notes (Signed)
ANTIBIOTIC CONSULT NOTE - FOLLOW UP  Pharmacy Consult for vancomycin + ceftazidime Indication: rule out pneumonia  aVital Signs: Temp: 100 F (37.8 C) (05/05 1300) Temp src: Core (Comment) (05/05 1300) BP: 95/46 mmHg (05/05 1300) Pulse Rate: 83  (05/05 1300) Intake/Output from previous day: 05/04 0701 - 05/05 0700 In: 2220 [I.V.:970; NG/GT:1200; IV Piggyback:50] Out: 302 [Urine:301; Stool:1] Intake/Output from this shift: Total I/O In: 658.6 [I.V.:248.6; NG/GT:360; IV Piggyback:50] Out: 2022 [Urine:20; Other:2000; Stool:2]  Labs:  Basename 01/03/12 1100 01/03/12 0356 01/03/12 0355 01/02/12 0442 01/02/12 0441 01/01/12 1505  WBC -- -- 15.5* -- 14.0* 18.3*  HGB -- -- 7.3* -- 7.6* 8.2*  PLT -- -- 526* -- 418* 394  LABCREA -- -- -- -- -- --  CREATININE 3.70* 6.79* -- 4.81* -- --   Estimated Creatinine Clearance: 25.2 ml/min (by C-G formula based on Cr of 3.7).  Basename 01/03/12 1200 01/01/12 0841  VANCOTROUGH -- --  Leodis Binet -- --  VANCORANDOM 14.9 38.1  GENTTROUGH -- --  GENTPEAK -- --  GENTRANDOM -- --  TOBRATROUGH -- --  TOBRAPEAK -- --  TOBRARND -- --  AMIKACINPEAK -- --  AMIKACINTROU -- --  AMIKACIN -- --     Microbiology: Recent Results (from the past 720 hour(s))  MRSA PCR SCREENING     Status: Normal   Collection Time   12/19/11  2:44 PM      Component Value Range Status Comment   MRSA by PCR NEGATIVE  NEGATIVE  Final   CULTURE, BLOOD (ROUTINE X 2)     Status: Normal   Collection Time   12/19/11  3:43 PM      Component Value Range Status Comment   Specimen Description BLOOD LEFT ARM  5 ML IN [facility] BOTTLE   Final    Special Requests NONE   Final    Culture  Setup Time [id number]   Final    Culture NO GROWTH 5 DAYS   Final    Report Status 12/25/2011 FINAL   Final   CULTURE, BLOOD (ROUTINE X 2)     Status: Normal   Collection Time   12/19/11  3:52 PM      Component Value Range Status Comment   Specimen Description BLOOD LEFT HAND  3 ML IN [facility] BOTTLE    Final    Special Requests NONE   Final    Culture  Setup Time [id number]   Final    Culture NO GROWTH 5 DAYS   Final    Report Status 12/25/2011 FINAL   Final   CULTURE, RESPIRATORY     Status: Normal   Collection Time   12/19/11  5:31 PM      Component Value Range Status Comment   Specimen Description TRACHEAL ASPIRATE   Final    Special Requests NONE   Final    Gram Stain     Final    Value: RARE WBC PRESENT,BOTH PMN AND MONONUCLEAR     RARE SQUAMOUS EPITHELIAL CELLS PRESENT     NO ORGANISMS SEEN   Culture Non-Pathogenic Oropharyngeal-type Flora Isolated.   Final    Report Status 12/22/2011 FINAL   Final   URINE CULTURE     Status: Normal   Collection Time   12/22/11  9:30 AM      Component Value Range Status Comment   Specimen Description URINE, CATHETERIZED   Final    Special Requests NONE   Final    Culture  Setup Time [id number]  Final    Colony Count NO GROWTH   Final    Culture NO GROWTH   Final    Report Status 12/23/2011 FINAL   Final   CULTURE, BLOOD (ROUTINE X 2)     Status: Normal   Collection Time   12/22/11 12:55 PM      Component Value Range Status Comment   Specimen Description BLOOD LEFT HAND   Final    Special Requests BOTTLES DRAWN AEROBIC AND ANAEROBIC [facility]   Final    Culture  Setup Time [id number]   Final    Culture NO GROWTH 5 DAYS   Final    Report Status 12/29/2011 FINAL   Final   CULTURE, BLOOD (ROUTINE X 2)     Status: Normal   Collection Time   12/22/11  1:05 PM      Component Value Range Status Comment   Specimen Description BLOOD LEFT HAND   Final    Special Requests BOTTLES DRAWN AEROBIC AND ANAEROBIC 5CC   Final    Culture  Setup Time [id number]   Final    Culture NO GROWTH 5 DAYS   Final    Report Status 12/29/2011 FINAL   Final   CULTURE, BLOOD (ROUTINE X 2)     Status: Normal   Collection Time   12/28/11  9:30 AM      Component Value Range Status Comment   Specimen Description BLOOD LEFT HAND   Final    Special Requests BOTTLES  DRAWN AEROBIC ONLY 5CC   Final    Culture  Setup Time [id number]   Final    Culture NO GROWTH 5 DAYS   Final    Report Status 01/03/2012 FINAL   Final   CULTURE, BLOOD (ROUTINE X 2)     Status: Normal   Collection Time   12/28/11  9:45 AM      Component Value Range Status Comment   Specimen Description BLOOD LEFT HAND   Final    Special Requests BOTTLES DRAWN AEROBIC ONLY [facility]   Final    Culture  Setup Time [id number]   Final    Culture NO GROWTH 5 DAYS   Final    Report Status 01/03/2012 FINAL   Final   URINE CULTURE     Status: Normal   Collection Time   12/28/11  9:55 AM      Component Value Range Status Comment   Specimen Description URINE, CATHETERIZED   Final    Special Requests NONE   Final    Culture  Setup Time [id number]   Final    Colony Count NO GROWTH   Final    Culture NO GROWTH   Final    Report Status 12/29/2011 FINAL   Final   CULTURE, RESPIRATORY     Status: Normal   Collection Time   12/28/11  9:57 AM      Component Value Range Status Comment   Specimen Description ENDOTRACHEAL   Final    Special Requests NONE   Final    Gram Stain     Final    Value: FEW WBC PRESENT,BOTH PMN AND MONONUCLEAR     NO SQUAMOUS EPITHELIAL CELLS SEEN     NO ORGANISMS SEEN   Culture NO GROWTH 2 DAYS   Final    Report Status 12/30/2011 FINAL   Final     Anti-infectives     Start     Dose/Rate Route Frequency Ordered Stop   12/31/11 1400  vancomycin (VANCOCIN) IVPB 1000 mg/200 mL premix        1,000 mg 200 mL/hr over 60 Minutes Intravenous  Once 12/31/11 1247 12/31/11 1425   12/30/11 1200   vancomycin (VANCOCIN) IVPB 1000 mg/200 mL premix  Status:  Discontinued        1,000 mg 200 mL/hr over 60 Minutes Intravenous Every Hemodialysis 12/30/11 0841 12/30/11 1556   12/30/11 0900   Levofloxacin (LEVAQUIN) IVPB 250 mg        250 mg 50 mL/hr over 60 Minutes Intravenous Every 24 hours 12/30/11 0829 12/31/11 1834   12/30/11 0800   Levofloxacin (LEVAQUIN) IVPB 250 mg  Status:   Discontinued        250 mg 50 mL/hr over 60 Minutes Intravenous Every 24 hours 12/29/11 1000 12/30/11 0829   12/29/11 1200   vancomycin (VANCOCIN) IVPB 1000 mg/200 mL premix        1,000 mg 200 mL/hr over 60 Minutes Intravenous Every Hemodialysis 12/29/11 1109 12/29/11 1840   12/28/11 1300   cefTAZidime (FORTAZ) 1 g in dextrose 5 % 50 mL IVPB        1 g 100 mL/hr over 30 Minutes Intravenous Every 24 hours 12/28/11 1146     12/28/11 1230   vancomycin (VANCOCIN) 2,000 mg in sodium chloride 0.9 % 500 mL IVPB        2,000 mg 250 mL/hr over 120 Minutes Intravenous  Once 12/28/11 1146 12/28/11 1435   12/26/11 0915   Levofloxacin (LEVAQUIN) IVPB 250 mg        250 mg 50 mL/hr over 60 Minutes Intravenous Every 24 hours 12/25/11 1034 12/29/11 0950   12/24/11 1000   vancomycin (VANCOCIN) IVPB 1000 mg/200 mL premix  Status:  Discontinued        1,000 mg 200 mL/hr over 60 Minutes Intravenous Every 24 hours 12/23/11 0843 12/25/11 0958   12/23/11 1200   piperacillin-tazobactam (ZOSYN) IVPB 2.25 g  Status:  Discontinued        2.25 g 100 mL/hr over 30 Minutes Intravenous 4 times per day 12/23/11 0910 12/25/11 0958   12/23/11 0915   Levofloxacin (LEVAQUIN) IVPB 250 mg  Status:  Discontinued        250 mg 50 mL/hr over 60 Minutes Intravenous Every 24 hours 12/23/11 0905 12/25/11 1034   12/22/11 1300   piperacillin-tazobactam (ZOSYN) IVPB 2.25 g  Status:  Discontinued        2.25 g 100 mL/hr over 30 Minutes Intravenous Every 8 hours 12/22/11 1233 12/23/11 0910   12/21/11 2359   vancomycin (VANCOCIN) 1,250 mg in sodium chloride 0.9 % 250 mL IVPB  Status:  Discontinued        1,250 mg 166.7 mL/hr over 90 Minutes Intravenous Every 12 hours 12/21/11 1532 12/22/11 0722   12/21/11 1330   vancomycin (VANCOCIN) IVPB 1000 mg/200 mL premix  Status:  Discontinued        1,000 mg 200 mL/hr over 60 Minutes Intravenous Every 8 hours 12/21/11 1225 12/21/11 1532   12/19/11 1330   vancomycin (VANCOCIN) IVPB  1000 mg/200 mL premix  Status:  Discontinued        1,000 mg 200 mL/hr over 60 Minutes Intravenous Every 8 hours 12/19/11 1302 12/21/11 1107   12/19/11 1315   piperacillin-tazobactam (ZOSYN) IVPB 3.375 g  Status:  Discontinued        3.375 g 12.5 mL/hr over 240 Minutes Intravenous Every 8 hours 12/19/11 1302 12/22/11 1233  Assessment: 45 yom admitted for bilateral tibial fracture s/p syncope (was stable after surgery 4/19) but then transferred to the ICU due to symptoms of septic shock and NSTEMI. Today is D#7 of vancomycin + ceftazidime. Tmax 100.5, WBC 15.5. Pt received 3.5 hours of HD early this morning. A 4 hour post-HD vanc level is 14.9. Had achieved a supratherapeutic pre-HD level when 1gm of vanc was supplemented post-HD.  4/24 levo > 5/2 4/20 zosyn >4/26 4/20 vanc > 4/26 - restarted 4/29 >> 4/29 fortaz>>  MRSA (-) 4/29 bloodx2: NGTD 4/29 Urine/Resp (-) 4/23 Blood/urine (-) 4/20 Resp/blood (-)  Goal of Therapy:  Pre-HD vanc level = 15-25 Post-HD vanc level 5-15  Plan:  1. Vancomycin 500mg  IV x 1 2. F/u HD plans  Brennan Litzinger, Drake Leach 01/03/2012,1:48 PM

## 2012-01-03 NOTE — Progress Notes (Addendum)
Name: Reginald Gutierrez MRN: [id number] DOB: 1966-08-23    LOS: 16  PCCM PROGRESS NOTE  Active Problems:  NSTEMI (non-ST elevated myocardial infarction)  Acute respiratory failure  Aspiration pneumonia  Acute renal failure  Tibial fracture  Acute blood loss anemia  Protein calorie malnutrition  Encephalopathy acute  HYPOTHYROIDISM  DIABETES MELLITUS, TYPE I  History of Present Illness: 46 y/o with history of DM and cocaine abuse admitted with bilateral tibia fracture s/p syncopal episode on 4/19.  Postoperatively he developed acute hypoxia and refractory shock, transient ST depression and troponin leak.  Course is complicated by ARDS requiring HFOV and acute renal failure requiring CVVHD.  Lines / Drains: 4/20 OETT >>> 4/20 R IJ CVL >>> 4/20 R fem A-line >>>5/2 4/23 L IJ HD>>>Changed 4/30>>5/2 (thrombosed) 5/2   Rt IJ Diatek cath ([place])>>  Cultures: 4/20  Blood >>>negative 4/20  Sputum >>>negative 4/23  Blood >>> negative 4/23  Urine >>>negative 4/29  Sputum>>>negative 4/29  Urine>>>negative 4/29  Blood>>>negative   Abx: Zosyn 4/20 (empirical, G-, anaerobes) >>>4/26 Vanc 4/20 (empirical, MRSA) >>>4/26 Vanc restart 4/29>>>off Levaquin 4/24 (empirical, atypical) >>>5/3 Ceftazidime Elita Quick) 4/29>>>  Tests / Events: 4/19  Admission, repair of bilat tibia Fx 4/20  Refractory shock, hypoxemic resp failure, NSTEMI.  Intubated 4/20  CT head >>> NAD 4/20  TTE >>> EF 25 to 30%, akinesis of mid/distal anteroseptal and apical area 4/21  Chest CT angio >>> No central pulmonary embolism. Slight progression of right greater than left airspace disease. 4/23  Renal US >>> no hydronephrosis, no urine obstruction 4/23  HFOV due to refractory hypoxemia.  Vasopressors.  CVVHD. 4/24  Off HFOV 4/29  Transfused 1 U blood 5/3    Tranfuse 1 unit PRBC  Subjective/interval: Hemodynamically stable overnight.  Intermittent agitation.  Vital Signs: Temp:  [96.9 F (36.1 C)-100.5 F (38.1  C)] 100.5 F (38.1 C) (05/05 0900) Pulse Rate:  [81-113] 91  (05/05 0900) Resp:  [13-21] 14  (05/05 0900) BP: (80-128)/(38-62) 94/40 mmHg (05/05 0900) SpO2:  [91 %-100 %] 94 % (05/05 0900) FiO2 (%):  [30 %] 30 % (05/05 0900) Weight:  [190 lb 0.6 oz (86.2 kg)] 190 lb 0.6 oz (86.2 kg) (05/05 0330) I/O last 3 completed shifts: In: 2990 [I.V.:1450; NG/GT:1490; IV Piggyback:50] Out: 302 [Urine:301; Stool:1]  Physical Examination: General - Comfortable, synchronous HEENT - ETT / OGT Cardiac - Regular, no murmurs Chest - Bilateral air entry, few scattered rhonchi, resps even non labored on vent  Abd - Soft, nontener Ext - Bilateral pitting edema Neuro - Sedated, RASS -1, intermittently agitated, opens eyes to command  Ventilator settings: Vent Mode:  [-] PRVC FiO2 (%):  [30 %] 30 % Set Rate:  [14 bmp] 14 bmp Vt Set:  [520 mL] 520 mL PEEP:  [5 cmH20] 5 cmH20 Pressure Support:  [5 cmH20] 5 cmH20 Plateau Pressure:  [16 cmH20-21 cmH20] 16 cmH20  ASSESSMENT AND PLAN  NEUROLOGIC A:  Acute encephalopathy.  Requires high doses of Fentanyl / Versed for sedation. P: -->  Change sedation regimen to Precedex -->  Wean Fentanyl / Versed gtt to off -->  Fentanyl boluses for pain / withdrawal symptoms -->  Daily WUA  PULMONARY  Lab 01/03/12 0435 12/30/11 0516  PHART 7.394 7.449  PCO2ART 44.4 40.3  PO2ART 85.0 69.0*  HCO3 27.0* 27.8*  O2SAT 96.0 94.0   A:  Acute respiratory failure secondary to PNA, ARDS, acute pulmonary edema, and possible fat embolism. P: -->  Continue full support -->  CXR / ABG in AM -->  Daily SBT -->  Evaluate for possible tracheostomy in AM (pre-tach orders placed)  CARDIOVASCULAR No results found for this basename: LATICACIDVEN:5, O2SATVEN:5 in the last 168 hours A:  NSTEMI.  Acute systolic CHF. P: -->  ASA, Coreg, Lipitor -->  No ACE due to renal failure -->  Will need cardiac cath before discharge / transfer.  Should address with cardiology on  Monday.  RENAL  Lab 01/03/12 0356 01/02/12 0442 01/01/12 0402 12/31/11 0440 12/30/11 0500  NA 136 138 136 136 133*  K 4.0 3.6 -- -- --  CL 99 101 99 101 98  CO2 25 27 27 24 26   GLUCOSE 156* 90 93 217* 145*  BUN 69* 43* 54* 64* 37*  CREATININE 6.79* 4.81* 5.32* 6.10* 3.84*  CALCIUM 8.5 8.6 8.7 8.9 8.7  MG -- -- -- -- --  PHOS 6.1* 4.5 4.7* 3.7 2.9   A:  Acute renal failure from ATN with shock/hypoxia, IV contrast load, and DM. P: -->  HD per renal  GASTROINTESTINAL  Lab 01/03/12 0356 01/02/12 0442 01/01/12 0402 12/31/11 0440 12/30/11 0500  AST -- -- -- -- 42*  ALT -- -- -- -- 32  ALKPHOS -- -- -- -- 98  BILITOT -- -- -- -- 0.3  PROT -- -- -- -- 5.6*  ALBUMIN 2.2* 2.1* 2.1* 2.1* 2.1*2.0*   A:  Malnutrition. P: -->  TF -->  GIP  HEMATOLOGIC  Lab 01/03/12 0355 01/02/12 0441 01/01/12 1505 01/01/12 0402 12/31/11 0440  HCT 22.8* 24.1* 25.7* 22.4* 24.3*  INR -- -- -- -- --  APTT -- -- -- -- --   A:  Anemia of critical illness and chronic disease. P: -->  Aranesp -->  CBC in AM -->  Transfuse or Hb<7  INFECTIOUS  Lab 01/03/12 0355 01/02/12 0441 01/01/12 1505 01/01/12 0402 12/31/11 0440  WBC 15.5* 14.0* 18.3* 15.3* 15.6*  PROCALCITON -- -- -- -- --   A:  HCAP. P: -->  Ceftazidime (started 4/29)  ENDOCRINE  Lab 01/03/12 1147 01/03/12 0711 01/03/12 0339 01/02/12 2344 01/02/12 2019  GLUCAP 218* 98 160* 141* 121*   A:  IDDM.  Hypothyroidism. P: -->  SSI / CBG -->  Synthroid  BEST PRACTICE / DISPOSITION -->  ICU status under PCCM -->  Cardiology / Nephrology / Ortho (tibial fracture) following -->  Partial code (NO CPR, NO defibrillation) -->  TF, NPO after midnight for possible trach -->  Heparin [place] for DVT Px -->  Protonix IV for GI Px -->  Ventilator bundle -->  Family updated at bedside  [facility], NP 01/03/2012  10:05 AM Pager: [phone]  Care during the described time interval was provided by me and/or other providers on the  critical care team. I have reviewed this patient's available data, including medical history, events of note, physical examination and test results as part of my evaluation.  The patient is critically ill with multiple organ systems failure and requires high complexity decision making for assessment and support, frequent evaluation and titration of therapies, application of advanced monitoring technologies and extensive interpretation of multiple databases. Critical Care Time devoted to patient care services described in this note is 35 minutes.  Orlean Bradford, M.D., F.C.C.P. Pulmonary and Critical Care Medicine [facility] Cell: [phone] Pager: [phone]

## 2012-01-04 ENCOUNTER — Inpatient Hospital Stay (HOSPITAL_COMMUNITY): Payer: Medicare Other

## 2012-01-04 ENCOUNTER — Encounter (HOSPITAL_COMMUNITY): Payer: Medicare Other

## 2012-01-04 ENCOUNTER — Encounter (HOSPITAL_COMMUNITY): Payer: Self-pay | Admitting: Vascular Surgery

## 2012-01-04 LAB — BASIC METABOLIC PANEL
CO2: 26 mEq/L (ref 19–32)
Glucose, Bld: 224 mg/dL — ABNORMAL HIGH (ref 70–99)
Potassium: 3.6 mEq/L (ref 3.5–5.1)
Sodium: 135 mEq/L (ref 135–145)

## 2012-01-04 LAB — GLUCOSE, CAPILLARY
Glucose-Capillary: 132 mg/dL — ABNORMAL HIGH (ref 70–99)
Glucose-Capillary: 180 mg/dL — ABNORMAL HIGH (ref 70–99)
Glucose-Capillary: 226 mg/dL — ABNORMAL HIGH (ref 70–99)
Glucose-Capillary: 245 mg/dL — ABNORMAL HIGH (ref 70–99)

## 2012-01-04 LAB — APTT: aPTT: 30 seconds (ref 24–37)

## 2012-01-04 LAB — CBC
Hemoglobin: 7.6 g/dL — ABNORMAL LOW (ref 13.0–17.0)
RBC: 2.68 MIL/uL — ABNORMAL LOW (ref 4.22–5.81)

## 2012-01-04 LAB — PROTIME-INR: INR: 1.21 (ref 0.00–1.49)

## 2012-01-04 LAB — IRON AND TIBC: Iron: 31 ug/dL — ABNORMAL LOW (ref 42–135)

## 2012-01-04 MED ORDER — HALOPERIDOL LACTATE 5 MG/ML IJ SOLN
10.0000 mg | Freq: Once | INTRAMUSCULAR | Status: AC
  Administered 2012-01-04: 10 mg via INTRAVENOUS

## 2012-01-04 MED ORDER — CARVEDILOL 3.125 MG PO TABS
3.1250 mg | ORAL_TABLET | Freq: Two times a day (BID) | ORAL | Status: DC
  Administered 2012-01-05 – 2012-01-07 (×4): 3.125 mg via ORAL
  Filled 2012-01-04 (×9): qty 1

## 2012-01-04 MED ORDER — HEPARIN SODIUM (PORCINE) 1000 UNIT/ML DIALYSIS
40.0000 [IU]/kg | Freq: Once | INTRAMUSCULAR | Status: AC
  Administered 2012-01-04: 3200 [IU] via INTRAVENOUS_CENTRAL
  Filled 2012-01-04: qty 4

## 2012-01-04 MED ORDER — ETOMIDATE 2 MG/ML IV SOLN
INTRAVENOUS | Status: AC
  Administered 2012-01-04: 15:00:00
  Filled 2012-01-04: qty 10

## 2012-01-04 MED ORDER — HALOPERIDOL LACTATE 5 MG/ML IJ SOLN
INTRAMUSCULAR | Status: AC
  Administered 2012-01-04: 10 mg via INTRAVENOUS
  Filled 2012-01-04: qty 2

## 2012-01-04 MED ORDER — FENTANYL CITRATE 0.05 MG/ML IJ SOLN: 25.0000 ug | INTRAMUSCULAR | Status: AC | PRN

## 2012-01-04 MED ORDER — MIDAZOLAM HCL 2 MG/2ML IJ SOLN
INTRAMUSCULAR | Status: AC
  Filled 2012-01-04: qty 6

## 2012-01-04 MED ORDER — SODIUM CHLORIDE 0.9 % IV SOLN
20.0000 ug | Freq: Once | INTRAVENOUS | Status: AC
  Administered 2012-01-04: 20 ug via INTRAVENOUS
  Filled 2012-01-04: qty 5

## 2012-01-04 MED ORDER — DARBEPOETIN ALFA-POLYSORBATE 200 MCG/0.4ML IJ SOLN
200.0000 ug | INTRAMUSCULAR | Status: DC
  Administered 2012-01-06: 200 ug via SUBCUTANEOUS
  Filled 2012-01-04 (×3): qty 0.4

## 2012-01-04 MED ORDER — VECURONIUM BROMIDE 10 MG IV SOLR
INTRAVENOUS | Status: AC
  Administered 2012-01-04: 15:00:00
  Filled 2012-01-04: qty 10

## 2012-01-04 MED ORDER — FENTANYL CITRATE 0.05 MG/ML IJ SOLN
INTRAMUSCULAR | Status: AC
  Filled 2012-01-04: qty 6

## 2012-01-04 NOTE — Op Note (Signed)
NAMEAMOL, DOMANSKI NO.:  [id number]  MEDICAL RECORD NO.:  [id number]  LOCATION:  2102                         FACILITY:  [facility]  PHYSICIAN:  [name], MD DATE OF BIRTH:  1966/01/11  DATE OF PROCEDURE:  01/04/2012 DATE OF DISCHARGE:                              OPERATIVE REPORT   PROCEDURE:  Percutaneous tracheostomy on this date about 30 minutes ago at 3 p.m.  Consent was obtained with the patient's mother.  Fully aware of risks and benefits of the procedure including infection, pneumothorax, bleeding and death.  Bronchoscopist for the procedure is Dr. Molli Knock with 1st assistant, [name], NP-C.  PREOPERATIVE DIAGNOSIS:  Acute hypoxic hypercarbic respiratory failure with likely fat emboli, pneumonia, myocardial infarction.  POSTOPERATIVE DIAGNOSIS:  Status post tracheostomy secondary to extended prolonged acute respiratory distress syndrome ventilation needs.  The patient was placed in supine position.  Chlorhexidine preparation was used to sterilize the operative site.  The patient did have DDAVP provided preoperatively, also was dialyzed to improve the "stickiness" of his platelets.  After chlorhexidine preparation, the bronchoscopist placed the bronchoscope through the endotracheal tube back up to approximately 16 cm.  I then injected 7 mL of lidocaine plus epinephrine of the 2nd endotracheal space, made a 1.0 cm vertical incision and then made dissection down, noted and identify the strap muscles and dissected through them and separated them.  I then was able to place a white catheter sheath over 18-gauge needle into the airway, which was noted by bronchoscopist of the 2nd endotracheal space.  There was no posterior wall injury.  Though, everything was removed, the white catheter sheath remained.  I then placed a wire through the white catheter sheath, successfully noted by the bronchoscopist.  I then placed a 14-French punch dilator over  the wire successfully, then removed it.  I then placed a progressive Rhino dilator to 30-French over a glider successfully.  The dilator was then removed.  The glider and wire remained.  I then placed a size 6 tracheostomy over a 26-French dilator successfully into the airway over the wire and glider.  Everything was removed except for the tracheostomy.  The bronchoscopist placed a bronchoscope through the new tracheostomy tube and noted the carina approximately 3.5-cm below without any posterior wall injury, any lacerations, or bleeding.  Blood loss for the procedure was approximately 6 mL.  The patient tolerated the procedure quite well with normal saturations, normal blood pressure throughout the procedure. Portable chest x-ray is pending at this time.     [name], MD    DJF/MEDQ  D:  01/04/2012  T:  01/04/2012  Job:  [id number]

## 2012-01-04 NOTE — Procedures (Signed)
I was present at this session.  I have reviewed the session itself and made appropriate changes.  BP ok, cath flow 400.  Agitated.  To come off early and get Trach  Juluis Fitzsimmons L 5/6/20132:14 PM

## 2012-01-04 NOTE — Progress Notes (Signed)
SLP Cancellation Note  Evaluation cancelled today due to patient has just recieved tracheostomy today. Will f/u in am 01/05/12.  Ferdinand Lango MA, CCC-SLP [phone]   Ferdinand Lango Meryl 01/04/2012, 3:42 PM

## 2012-01-04 NOTE — Procedures (Signed)
Bronchoscopy Procedure Note Reginald Gutierrez [id number] [birth year]  Procedure: Bronchoscopy Indications: Tracheostomy Placement  Procedure Details Consent: Risks of procedure as well as the alternatives and risks of each were explained to the (patient/caregiver).  Consent for procedure obtained. Time Out: Verified patient identification, verified procedure, site/side was marked, verified correct patient position, special equipment/implants available, medications/allergies/relevent history reviewed, required imaging and test results available.  Performed  In preparation for procedure, patient was given 100% FiO2 and bronchoscope lubricated. Sedation: Etomidate and versed, and fentanyl  Airway entered and the following bronchi were examined: RUL, RML, RLL, LUL and LLL.   Procedures performed: Placement of tracheostomy  Bronchoscope removed.    Evaluation Hemodynamic Status: BP stable throughout; O2 sats: stable throughout Patient's Current Condition: stable Specimens:  None Complications: No apparent complications Patient did tolerate procedure well.   Procedure performed under direct supervision of Dr. Molli Knock.  [name], NP-C [facility] Pgr: [phone]      Patient seen and examined, agree with above note.  I dictated the care and orders written for this patient under my direction.  Koren Bound, M.D. [phone]

## 2012-01-04 NOTE — Procedures (Signed)
Perc trach See full dication Size 6 Blood loss 6 cc Tolerated well See full dication  Mcarthur Rossetti. Tyson Alias, MD, FACP Pgr: [phone] [facility]

## 2012-01-04 NOTE — Procedures (Signed)
Bedside Tracheostomy Insertion Procedure Note   Patient Details:   Name: Reginald Gutierrez DOB: Jan 16, 1966 MRN: [id number]  Procedure: Tracheostomy  Pre Procedure Assessment: ET Tube Size: ET Tube secured at lip (cm): Bite block in place: No Breath Sounds: Clear  Post Procedure Assessment: BP 88/44  Pulse 92  Temp(Src) 97.7 F (36.5 C) (Oral)  Resp 14  Ht 5\' 6"  (1.676 m)  Wt 175 lb 11.3 oz (79.7 kg)  BMI 28.36 kg/m2  SpO2 100% O2 sats: stable throughout Complications: No apparent complications Patient did tolerate procedure well Tracheostomy Brand:Shiley Tracheostomy Style:Cuffed Tracheostomy Size: 6 Tracheostomy Secured WUJ:[id number], velcro Tracheostomy Placement Confirmation:Trach cuff visualized and in place, chest xray ordered for placement    Terence Googe Ann 01/04/2012, 3:39 PM

## 2012-01-04 NOTE — Care Management Note (Signed)
    Page 1 of 1   01/05/2012     3:35:53 PM   CARE MANAGEMENT NOTE 01/05/2012  Patient:  Reginald Gutierrez, Reginald Gutierrez   Account Number:  [id number]  Date Initiated:  12/21/2011  Documentation initiated by:  DAVIS,RHONDA  Subjective/Objective Assessment:   pt with known diabetes noncompliatent,fell at home sustaining bilateral lower tib-fib fractures,admitted and reciveringon floor, had seizure and resp. arrest requiring intubataion     Action/Plan:   lives at  home unsure of needed outcome poss snf for rehab or ltac due to resp status   Anticipated DC Date:  01/08/2012   Anticipated DC Plan:  LONG TERM ACUTE CARE (LTAC)      DC Planning Services  CM consult      Choice offered to / List presented to:             Status of service:  In process, will continue to follow Medicare Important Message given?   (If response is "NO", the following Medicare IM given date fields will be blank) Date Medicare IM given:   Date Additional Medicare IM given:    Discharge Disposition:    Per UR Regulation:  Reviewed for med. necessity/level of care/duration of stay  If discussed at Long Length of Stay Meetings, dates discussed:    Comments:  01-04-12 Barkley Bruns, RNBSN [phone] Talked with patients mother about progression of patient. Trached today and on intermittent HD.  Mother excited about patient going to Lt[facility].  Given choice of [facility] - chose [facility] as it is convenient for her at the hospital.  Asked for Ltach order - referral made to [facility] at [facility].  [id number] Earlene Plater, RN,BSN,CCM Case Management [phone]

## 2012-01-04 NOTE — Progress Notes (Signed)
Chaplain's Note:  11:55 Responded to request from pt mother to visit with her in the solarium.  Located pt mother and offered pastoral presence and conversation.  Mother waiting for pt to be done with dialysis and also having a trach today.  Mother has appreciated support of chaplain's in past and also has strong support from church community.  Offered support as needed.  She thanked me for visit and support.

## 2012-01-04 NOTE — Progress Notes (Signed)
Physical Therapy Cancellation Note: pt remains intubated but agitated and currently receiving HD and unable to participate in therapy or AAROM. Will attempt at later date. Thanks Delaney Meigs, PT [phone]

## 2012-01-04 NOTE — Progress Notes (Signed)
Subjective: Interval History: none.  Objective: Vital signs in last 24 hours: Temp:  [98.4 F (36.9 C)-100.8 F (38.2 C)] 98.6 F (37 C) (05/06 0700) Pulse Rate:  [66-92] 84  (05/06 0813) Resp:  [14-20] 15  (05/06 0813) BP: (81-125)/(33-64) 91/64 mmHg (05/06 0813) SpO2:  [92 %-100 %] 93 % (05/06 0813) FiO2 (%):  [30 %] 30 % (05/06 0813) Weight change: -5.3 kg (-11 lb 11 oz)  Intake/Output from previous day: 05/05 0701 - 05/06 0700 In: 2020.4 [I.V.:1020.4; NG/GT:850; IV Piggyback:150] Out: 2071 [Urine:68; Stool:3] Intake/Output this shift: Total I/O In: 79 [I.V.:49; NG/GT:30] Out: -   General appearance: sedated on vent, but breathing spont, opens eyes, moves extrem Resp: diminished breath sounds bilaterally and rales bibasilar Chest wall: no tenderness, PC on R Cardio: S1, S2 normal and systolic murmur: systolic ejection 3/6, decrescendo at 2nd left intercostal space GI: pos BS, midline hernia, soft, liver down 4 cm Skin: pale Incision/Wound:  Lab Results:  Basename 01/04/12 0450 01/03/12 0355  WBC 18.4* 15.5*  HGB 7.6* 7.3*  HCT 23.8* 22.8*  PLT 575* 526*   BMET:  Basename 01/04/12 0450 01/03/12 1100  NA 135 136  K 3.6 3.7  CL 99 100  CO2 26 28  GLUCOSE 224* 228*  BUN 53* 30*  CREATININE 5.31* 3.70*  CALCIUM 8.9 8.3*   No results found for this basename: PTH:2 in the last 72 hours Iron Studies: No results found for this basename: IRON,TIBC,TRANSFERRIN,FERRITIN in the last 72 hours  Studies/Results: Dg Chest Port 1 View  01/03/2012  *RADIOLOGY REPORT*  Clinical Data: Intubated  PORTABLE CHEST - 1 VIEW  Comparison:   the previous day's study  Findings: Endotracheal tube, nasogastric tube, right IJ central line, and tunneled right IJ hemodialysis catheter are stable in position.  Heart size upper limits normal.  Perihilar and bibasilar airspace disease, stable right, slightly improved on the left since previous exam.  No definite effusion.  IMPRESSION:  1.   Slight improvement in asymmetric airspace infiltrates or edema. 2. Support hardware stable in position.  Original Report Authenticated By: Osa Craver, M.D.    I have reviewed the patient's current medications.  Assessment/Plan: 1 AKI oliguric.  Will do HD as difficult to control vol.   Solute ok.   2 Anemia will check Fe and ^ ePO 3 VDRF per CCM 4 DM fair control 5 Pneu appears resolving 6 Tibia fx  7 MI 8 Hypotension lower Carvedilol  P HD, EPO, AB,     LOS: 17 days   Reginald Gutierrez L 01/04/2012,8:35 AM

## 2012-01-04 NOTE — Progress Notes (Signed)
Name: Reginald Gutierrez MRN: [id number] DOB: May 01, 1966    LOS: 17  PCCM PROGRESS NOTE  Active Problems:  HYPOTHYROIDISM  DIABETES MELLITUS, TYPE I  Tibial fracture  NSTEMI (non-ST elevated myocardial infarction)  Acute blood loss anemia  Acute respiratory failure  Aspiration pneumonia  Protein calorie malnutrition  Acute renal failure  Encephalopathy acute  History of Present Illness: 46 y/o with history of DM and cocaine abuse admitted with bilateral tibia fracture s/p syncopal episode on 4/19.  Postoperatively he developed acute hypoxia and refractory shock, transient ST depression and troponin leak.  Course is complicated by ARDS requiring HFOV and acute renal failure requiring CVVHD.  Lines / Drains: 4/20 OETT >>> 4/20 R IJ CVL >>> 4/20 R fem A-line >>>5/2 4/23 L IJ HD>>>Changed 4/30>>5/2 (thrombosed) 5/2   Rt IJ Diatek cath (Dickson)>>  Cultures: 4/20  Blood >>>negative 4/20  Sputum >>>negative 4/23  Blood >>> negative 4/23  Urine >>>negative 4/29  Sputum>>>negative 4/29  Urine>>>negative 4/29  Blood>>>negative   Abx: Zosyn 4/20 (empirical, G-, anaerobes) >>>4/26 Vanc 4/20 (empirical, MRSA) >>>4/26 Vanc restart 4/29>>>off Levaquin 4/24 (empirical, atypical) >>>5/3 Ceftazidime Elita Quick) 4/29>>>  Tests / Events: 4/19  Admission, repair of bilat tibia Fx 4/20  Refractory shock, hypoxemic resp failure, NSTEMI.  Intubated 4/20  CT head >>> NAD 4/20  TTE >>> EF 25 to 30%, akinesis of mid/distal anteroseptal and apical area 4/21  Chest CT angio >>> No central pulmonary embolism. Slight progression of right greater than left airspace disease. 4/23  Renal US >>> no hydronephrosis, no urine obstruction 4/23  HFOV due to refractory hypoxemia.  Vasopressors.  CVVHD. 4/24  Off HFOV 4/29  Transfused 1 U blood 5/3    Tranfuse 1 unit PRBC  Subjective/interval: No pressors , for HD today  Vital Signs: Temp:  [98.2 F (36.8 C)-100.8 F (38.2 C)] 98.5 F (36.9 C) (05/06  1000) Pulse Rate:  [66-96] 96  (05/06 1000) Resp:  [14-20] 17  (05/06 1000) BP: (81-125)/(33-67) 107/47 mmHg (05/06 1000) SpO2:  [92 %-100 %] 94 % (05/06 1000) FiO2 (%):  [30 %] 30 % (05/06 0813) I/O last 3 completed shifts: In: 3170.4 [I.V.:1510.4; NG/GT:1510; IV Piggyback:150] Out: 2172 [Urine:169; Other:2000; Stool:3]  Physical Examination: General -agitated, rass -1, followed commands HEENT - ETT / OGT Cardiac - Regular, no murmurs Chest - ronchi, reduced Abd - Soft, nontener Ext - Bilateral pitting edema Neuro - Sedated, RASS -1, intermittently agitated, opens eyes to command  Ventilator settings: Vent Mode:  [-] PSV FiO2 (%):  [30 %] 30 % Set Rate:  [14 bmp] 14 bmp Vt Set:  [520 mL] 520 mL PEEP:  [5 cmH20] 5 cmH20 Pressure Support:  [10 cmH20] 10 cmH20 Plateau Pressure:  [20 cmH20-25 cmH20] 20 cmH20  ASSESSMENT AND PLAN  NEUROLOGIC A:  Acute encephalopathy.  Requires high doses of Fentanyl / Versed for sedation. P: -->  failed Precedex -->  Wean Fentanyl / Versed gtt , re attempt trach -->  Fentanyl boluses for pain / withdrawal symptoms -->  Daily WUA  PULMONARY  Lab 01/03/12 0435 12/30/11 0516  PHART 7.394 7.449  PCO2ART 44.4 40.3  PO2ART 85.0 69.0*  HCO3 27.0* 27.8*  O2SAT 96.0 94.0   A:  Acute respiratory failure secondary to PNA, ARDS, acute pulmonary edema, and possible fat embolism. P: -->  Continue full support -->  Weaned today p[oor volujmes yet again, neurostatus also a barrier Wean attempt PS 10 noted -consider trach today, I have spoken and consented mom pcxr  in am   CARDIOVASCULAR No results found for this basename: LATICACIDVEN:5, O2SATVEN:5 in the last 168 hours A:  NSTEMI.  Acute systolic CHF. P: -->  ASA, Coreg, Lipitor -->  No ACE due to renal failure -->  Will need cardiac cath before discharge / transfer  RENAL  Lab 01/04/12 0450 01/03/12 1100 01/03/12 0356 01/02/12 0442 01/01/12 0402 12/31/11 0440  NA 135 136 136 138 136 --   K 3.6 3.7 -- -- -- --  CL 99 100 99 101 99 --  CO2 26 28 25 27 27  --  GLUCOSE 224* 228* 156* 90 93 --  BUN 53* 30* 69* 43* 54* --  CREATININE 5.31* 3.70* 6.79* 4.81* 5.32* --  CALCIUM 8.9 8.3* 8.5 8.6 8.7 --  MG -- -- -- -- -- --  PHOS -- 3.5 6.1* 4.5 4.7* 3.7   A:  Acute renal failure from ATN with shock/hypoxia, IV contrast load, and DM. P: -->  HD per renal today  GASTROINTESTINAL  Lab 01/03/12 1100 01/03/12 0356 01/02/12 0442 01/01/12 0402 12/31/11 0440 12/30/11 0500  AST -- -- -- -- -- 42*  ALT -- -- -- -- -- 32  ALKPHOS -- -- -- -- -- 98  BILITOT -- -- -- -- -- 0.3  PROT -- -- -- -- -- 5.6*  ALBUMIN 2.1* 2.2* 2.1* 2.1* 2.1* --   A:  Malnutrition. P: -->  TF tolerated, holding for trach -->  GIP  HEMATOLOGIC  Lab 01/04/12 0450 01/03/12 0355 01/02/12 0441 01/01/12 1505 01/01/12 0402  HCT 23.8* 22.8* 24.1* 25.7* 22.4*  INR -- -- -- -- --  APTT -- -- -- -- --   A:  Anemia of critical illness and chronic disease. P: -->  Aranesp -->  CBC in AM -->  Transfuse or Hb<7 -coags STA Consider ddavp pre trach  INFECTIOUS  Lab 01/04/12 0450 01/03/12 0355 01/02/12 0441 01/01/12 1505 01/01/12 0402  WBC 18.4* 15.5* 14.0* 18.3* 15.3*  PROCALCITON -- -- -- -- --   A:  HCAP. P: -->  Ceftazidime (started 4/29), consider stop date assessment No mrsa, dc vanc  ENDOCRINE  Lab 01/04/12 0748 01/04/12 0335 01/03/12 2343 01/03/12 1910 01/03/12 1529  GLUCAP 245* 226* 180* 111* 172*   A:  IDDM.  Hypothyroidism. P: -->  SSI / CBG -->  Synthroid  BEST PRACTICE / DISPOSITION -->  ICU status under PCCM -->  Cardiology / Nephrology / Ortho (tibial fracture) following -->  Partial code (NO CPR, NO defibrillation) -->  TF, NPO after midnight for possible trach -->  Heparin [place] for DVT Px -->  Protonix IV for GI Px -->  Ventilator bundle -->  Family updated at bedside   Care during the described time interval was provided by me and/or other providers on the critical care  team. I have reviewed this patient's available data, including medical history, events of note, physical examination and test results as part of my evaluation.  The patient is critically ill with multiple organ systems failure and requires high complexity decision making for assessment and support, frequent evaluation and titration of therapies, application of advanced monitoring technologies and extensive interpretation of multiple databases. Critical Care Time devoted to patient care services described in this note is 35 minutes.  Mcarthur Rossetti. Tyson Alias, MD, FACP Pgr: [phone] [facility]

## 2012-01-04 NOTE — Progress Notes (Signed)
UR Completed.  Reginald Gutierrez Jane [phone] 01/04/2012  

## 2012-01-04 NOTE — Progress Notes (Signed)
Subjective:  Patient intubated. Awake on vent. Partially sedated. Rhythm is NSR.  BP normal off pressors. EKG today shows improvement in lateral T wave abnormalities compared with 12/03/11.  Objective:  Vital Signs in the last 24 hours: Temp:  [98.4 F (36.9 C)-100.8 F (38.2 C)] 98.6 F (37 C) (05/06 0700) Pulse Rate:  [66-92] 92  (05/06 0700) Resp:  [14-20] 17  (05/06 0700) BP: (81-125)/(33-55) 111/49 mmHg (05/06 0700) SpO2:  [92 %-100 %] 93 % (05/06 0700) FiO2 (%):  [30 %] 30 % (05/06 0700)  Intake/Output from previous day: 05/05 0701 - 05/06 0700 In: 2020.4 [I.V.:1020.4; NG/GT:850; IV Piggyback:150] Out: 2071 [Urine:68; Stool:3] Intake/Output from this shift:       . antiseptic oral rinse  15 mL Mouth Rinse QID  . aspirin  81 mg Per Tube Daily  . atorvastatin  10 mg Oral q1800  . carvedilol  6.25 mg Oral BID WC  . cefTAZidime (FORTAZ)  IV  1 g Intravenous Q24H  . chlorhexidine  15 mL Mouth Rinse BID  . darbepoetin (ARANESP) injection - DIALYSIS  100 mcg Subcutaneous Weekly  . feeding supplement  30 mL Per Tube Q6H  . haloperidol lactate  10 mg Intravenous Once  . haloperidol lactate  5 mg Intravenous Once  . heparin  2,000 Units Dialysis Once in dialysis  . heparin  5,000 Units Subcutaneous Q8H  . insulin aspart  0-15 Units Subcutaneous Q4H  . insulin aspart  3 Units Subcutaneous Q4H  . insulin glargine  32 Units Subcutaneous Daily  . levothyroxine  50 mcg Oral QAC breakfast  . nystatin   Topical BID  . pantoprazole sodium  40 mg Per Tube Q1200  . vancomycin  500 mg Intravenous Once  . DISCONTD: carvedilol  3.125 mg Oral BID WC  . DISCONTD: etomidate  40 mg Intravenous Once  . DISCONTD: fentaNYL  200 mcg Intravenous Once  . DISCONTD: midazolam  5 mg Intravenous Once  . DISCONTD: vecuronium  10 mg Intravenous Once      . sodium chloride 2 mL/hr at 12/31/11 1622  . feeding supplement (OSMOLITE 1.5 CAL) 1,000 mL (01/01/12 1658)  . fentaNYL infusion  INTRAVENOUS 300 mcg/hr (01/04/12 0600)  . midazolam (VERSED) infusion 6 mg/hr (01/04/12 0100)  . DISCONTD: dexmedetomidine (PRECEDEX) IV infusion 1.2 mcg/kg/hr (01/03/12 1800)  . DISCONTD: dexmedetomidine (PRECEDEX) IV infusion for high rates 1.2 mcg/kg/hr (01/03/12 2100)  . DISCONTD: fentaNYL infusion INTRAVENOUS 300 mcg/hr (01/03/12 0300)  . DISCONTD: midazolam (VERSED) infusion 10 mg/hr (01/03/12 0400)    Physical Exam: The patient is intubated but awake and looking around. Chest: clearer anteriorly  Heart: No murmur gallop or rub. Abdomen: No apparent tenderness Extremities:Mild pedal edema.  Lab Results:  Basename 01/04/12 0450 01/03/12 0355  WBC 18.4* 15.5*  HGB 7.6* 7.3*  PLT 575* 526*    Basename 01/04/12 0450 01/03/12 1100  NA 135 136  K 3.6 3.7  CL 99 100  CO2 26 28  GLUCOSE 224* 228*  BUN 53* 30*  CREATININE 5.31* 3.70*    Basename 01/03/12 0554 01/02/12 2205  TROPONINI 0.70* 0.74*   Hepatic Function Panel  Basename 01/03/12 1100  PROT --  ALBUMIN 2.1*  AST --  ALT --  ALKPHOS --  BILITOT --  BILIDIR --  IBILI --   No results found for this basename: CHOL in the last 72 hours No results found for this basename: PROTIME in the last 72 hours  Imaging: Imaging results have been reviewed. Chest  xray shows mild CM and persistent bilateral infiltrates and mild congestion/edema Cardiac Studies: [facility]* *[facility]* [address] [place], [place] [address] [phone]  ------------------------------------------------------------ Transthoracic Echocardiography  Patient: Reginald Gutierrez, Reginald Gutierrez MR #: [id number] Study Date: 12/28/2011 Gender: M Age: 46 Height: 167.6cm Weight: 88.8kg BSA: 2.44m^2 Pt. Status: Room: 2102  Kevan Ny, MD PERFORMING [facility] ATTENDING [place], [place] SONOGRAPHER [place], [facility] ADMITTING Rory Percy cc:  ------------------------------------------------------------ LV EF: 40% - 45%  ------------------------------------------------------------ Indications: Acute respiratory distress 518.82.  ------------------------------------------------------------ History: PMH: Myocardial infarction. Risk factors: Current tobacco use. Hypertension. Diabetes mellitus. Dyslipidemia.  ------------------------------------------------------------ Study Conclusions  - Left ventricle: The cavity size was normal. Wall thickness was normal. Systolic function was mildly to moderately reduced. The estimated ejection fraction was in the range of 40% to 45%. There is hypokinesis of the mid-distal inferoposterior and apical myocardium. - Mitral valve: Mild regurgitation. - Left atrium: The atrium was mildly dilated. Transthoracic echocardiography. M-mode, complete 2D, spectral Doppler, and color Doppler. Height  Assessment/Plan:  Patient Active Hospital Problem List:  NSTEMI (non-ST elevated myocardial infarction) (12/19/2011)   Assessment: Rhythm stable NSR. Tolerating increased  Dose of carvedilol.    For trach today.   Plan for eventual cardiac cath depending on clinical course of his multi-organ failure.        LOS: 17 days    Cassell Clement 01/04/2012, 8:03 AM

## 2012-01-05 ENCOUNTER — Inpatient Hospital Stay (HOSPITAL_COMMUNITY): Payer: Medicare Other

## 2012-01-05 LAB — DIFFERENTIAL
Basophils Absolute: 0.2 10*3/uL — ABNORMAL HIGH (ref 0.0–0.1)
Basophils Relative: 1 % (ref 0–1)
Eosinophils Absolute: 0.9 10*3/uL — ABNORMAL HIGH (ref 0.0–0.7)
Eosinophils Relative: 6 % — ABNORMAL HIGH (ref 0–5)
Monocytes Absolute: 1.2 10*3/uL — ABNORMAL HIGH (ref 0.1–1.0)
Neutro Abs: 12 10*3/uL — ABNORMAL HIGH (ref 1.7–7.7)

## 2012-01-05 LAB — GLUCOSE, CAPILLARY
Glucose-Capillary: 106 mg/dL — ABNORMAL HIGH (ref 70–99)
Glucose-Capillary: 141 mg/dL — ABNORMAL HIGH (ref 70–99)

## 2012-01-05 LAB — COMPREHENSIVE METABOLIC PANEL
Alkaline Phosphatase: 111 U/L (ref 39–117)
BUN: 32 mg/dL — ABNORMAL HIGH (ref 6–23)
CO2: 29 mEq/L (ref 19–32)
GFR calc Af Amer: 18 mL/min — ABNORMAL LOW (ref >90)
GFR calc non Af Amer: 15 mL/min — ABNORMAL LOW (ref >90)
Glucose, Bld: 84 mg/dL (ref 70–99)
Potassium: 4.1 mEq/L (ref 3.5–5.1)
Total Bilirubin: 0.3 mg/dL (ref 0.3–1.2)
Total Protein: 6.1 g/dL (ref 6.0–8.3)

## 2012-01-05 LAB — CBC
Hemoglobin: 7.2 g/dL — ABNORMAL LOW (ref 13.0–17.0)
MCHC: 31 g/dL (ref 30.0–36.0)
Platelets: 542 10*3/uL — ABNORMAL HIGH (ref 150–400)
RDW: 15.8 % — ABNORMAL HIGH (ref 11.5–15.5)

## 2012-01-05 LAB — POCT I-STAT 3, ART BLOOD GAS (G3+)
O2 Saturation: 95 %
TCO2: 28 mmol/L (ref 0–100)
pCO2 arterial: 47.8 mmHg — ABNORMAL HIGH (ref 35.0–45.0)
pH, Arterial: 7.348 — ABNORMAL LOW (ref 7.350–7.450)
pO2, Arterial: 79 mmHg — ABNORMAL LOW (ref 80.0–100.0)

## 2012-01-05 LAB — PHOSPHORUS: Phosphorus: 4.1 mg/dL (ref 2.3–4.6)

## 2012-01-05 MED ORDER — FERUMOXYTOL INJECTION 510 MG/17 ML
510.0000 mg | Freq: Once | INTRAVENOUS | Status: AC
  Administered 2012-01-05: 510 mg via INTRAVENOUS
  Filled 2012-01-05: qty 17

## 2012-01-05 MED ORDER — METHADONE HCL 10 MG PO TABS
5.0000 mg | ORAL_TABLET | Freq: Three times a day (TID) | ORAL | Status: DC
  Administered 2012-01-05 – 2012-01-07 (×7): 5 mg via ORAL
  Filled 2012-01-05 (×7): qty 1

## 2012-01-05 MED ORDER — LORAZEPAM 1 MG PO TABS
2.0000 mg | ORAL_TABLET | Freq: Three times a day (TID) | ORAL | Status: DC
  Administered 2012-01-05 – 2012-01-07 (×7): 2 mg via ORAL
  Filled 2012-01-05 (×7): qty 2

## 2012-01-05 MED ORDER — RENA-VITE PO TABS
1.0000 | ORAL_TABLET | Freq: Every day | ORAL | Status: DC
  Administered 2012-01-05 – 2012-01-07 (×3): 1 via ORAL
  Filled 2012-01-05 (×3): qty 1

## 2012-01-05 MED ORDER — DEXTROSE 50 % IV SOLN
INTRAVENOUS | Status: AC
  Administered 2012-01-05: 50 mL
  Filled 2012-01-05: qty 50

## 2012-01-05 NOTE — Progress Notes (Signed)
Palliative Medicine Team SW  Pt referred in PMT rounds for family support. No family present at bedside, RN report infrequent family visitation. Reached pt's mother by phone to offer support. Mother thanked me and states she knows she will need support, but will call when she does. Family has contact info, will be available as needed.   Kennieth Francois, [place] Pager [phone]

## 2012-01-05 NOTE — H&P (Signed)
CRITICAL VALUE ALERT  Critical value received:  CBG 32  Date of notification: 01/05/2012  Time of notification:  0115  Critical value read back:yes  Nurse who received alert:  Humberto Leep RN  Hypoglycemia protocol initiated  will continue to monitor pt.

## 2012-01-05 NOTE — Progress Notes (Signed)
Subjective: Trached but does respond verbal   Objective: Vital signs in last 24 hours: Temp:  [96.3 F (35.7 C)-99.2 F (37.3 C)] 99.2 F (37.3 C) (05/07 0500) Pulse Rate:  [73-114] 92  (05/07 0600) Resp:  [11-23] 19  (05/07 0600) BP: (79-122)/(40-84) 97/43 mmHg (05/07 0600) SpO2:  [87 %-100 %] 96 % (05/07 0600) FiO2 (%):  [30 %-50 %] 40 % (05/07 0339) Weight:  [79.7 kg (175 lb 11.3 oz)-80.9 kg (178 lb 5.6 oz)] 79.7 kg (175 lb 11.3 oz) (05/06 1420)  Intake/Output from previous day: 05/06 0701 - 05/07 0700 In: 1333.7 [I.V.:1083.7; NG/GT:250] Out: -1206 [Urine:67] Intake/Output this shift: Total I/O In: 654.7 [I.V.:544.7; NG/GT:110] Out: -    Basename 01/05/12 0500 01/04/12 0450 01/03/12 0355  HGB 7.2* 7.6* 7.3*    Basename 01/05/12 0500 01/04/12 0450  WBC 16.6* 18.4*  RBC 2.54* 2.68*  HCT 23.2* 23.8*  PLT 542* 575*    Basename 01/05/12 0500 01/04/12 0450  NA 141 135  K 4.1 3.6  CL 103 99  CO2 29 26  BUN 32* 53*  CREATININE 4.27* 5.31*  GLUCOSE 84 224*  CALCIUM 8.6 8.9    Basename 01/04/12 1106  LABPT --  INR 1.21    Pt on vent trached, responds with eyes and head to verbal stim and touch, calfs soft bilaterally, wound approx with staples healling, wiggles toes to plantar rubbing in individual feet, palp pulse ox right foot 98%, faint DP pulse warm foot left.  Assessment/Plan: S/P IM nails Bilateral Tibial Fx stable No change in current management at this time   Jamelle Rushing 01/05/2012, 6:52 AM

## 2012-01-05 NOTE — Progress Notes (Signed)
Nutrition Follow-up  TF held yesterday for tracheostomy.  Remains on ventilator support and receiving intermittent HD.   Diet Order:  Osmolite 1.5 at 40 ml/h (via NG tube) with Prostat 30 ml QID to provide 1840 kcals, 120 grams protein, 732 ml free water daily.  TF currently running at 30 ml/h; was restarted last night at 10 ml/h; advancing to goal rate of 40 ml/h.  TF was held yesterday for tracheostomy.  Meds: Scheduled Meds:   . antiseptic oral rinse  15 mL Mouth Rinse QID  . aspirin  81 mg Per Tube Daily  . atorvastatin  10 mg Oral q1800  . carvedilol  3.125 mg Oral BID WC  . chlorhexidine  15 mL Mouth Rinse BID  . darbepoetin (ARANESP) injection - DIALYSIS  200 mcg Subcutaneous Q Wed-HD  . dextrose      . feeding supplement  30 mL Per Tube Q6H  . ferumoxytol  510 mg Intravenous Once  . heparin  5,000 Units Subcutaneous Q8H  . insulin aspart  0-15 Units Subcutaneous Q4H  . insulin aspart  3 Units Subcutaneous Q4H  . insulin glargine  32 Units Subcutaneous Daily  . levothyroxine  50 mcg Oral QAC breakfast  . LORazepam  2 mg Oral Q8H  . methadone  5 mg Oral Q8H  . multivitamin  1 tablet Oral Daily  . nystatin   Topical BID  . pantoprazole sodium  40 mg Per Tube Q1200  . DISCONTD: cefTAZidime (FORTAZ)  IV  1 g Intravenous Q24H   Continuous Infusions:   . sodium chloride 2 mL/hr at 12/31/11 1622  . feeding supplement (OSMOLITE 1.5 CAL) 1,000 mL (01/04/12 1607)  . fentaNYL infusion INTRAVENOUS 300 mcg/hr (01/05/12 0448)  . midazolam (VERSED) infusion 10 mg/hr (01/05/12 0445)   PRN Meds:.acetaminophen (TYLENOL) oral liquid 160 mg/5 mL, fentaNYL, fentaNYL, midazolam, ondansetron (ZOFRAN) IV, DISCONTD: fentaNYL  Labs:  CMP     Component Value Date/Time   NA 141 01/05/2012 0500   K 4.1 01/05/2012 0500   CL 103 01/05/2012 0500   CO2 29 01/05/2012 0500   GLUCOSE 84 01/05/2012 0500   BUN 32* 01/05/2012 0500   CREATININE 4.27* 01/05/2012 0500   CALCIUM 8.6 01/05/2012 0500   PROT 6.1  01/05/2012 0500   ALBUMIN 2.4* 01/05/2012 0500   AST 36 01/05/2012 0500   ALT 20 01/05/2012 0500   ALKPHOS 111 01/05/2012 0500   BILITOT 0.3 01/05/2012 0500   GFRNONAA 15* 01/05/2012 0500   GFRAA 18* 01/05/2012 0500     Intake/Output Summary (Last 24 hours) at 01/05/12 1448 Last data filed at 01/05/12 1241  Gross per 24 hour  Intake 1450.65 ml  Output     40 ml  Net 1410.65 ml    Weight Status:  79.7 kg (BMI=28.4)  Re-estimated needs:  1875 kcals, 100-120 grams protein daily  Nutrition Dx:  Inadequate oral intake, ongoing.  Goal:  TF to meet 90-100% of estimated needs, met.  Intervention:  Continue Osmolite 1.5 at 40 ml/h with Prostat 30 ml QID to provide 960 ml total fluid, 120 grams protein, 732 ml free water daily.  Monitor:  TF tolerance, fluid status, labs, weight trend.   Hettie Holstein Pager #:  [phone]

## 2012-01-05 NOTE — Progress Notes (Signed)
Subjective: Interval History: none.  Objective: Vital signs in last 24 hours: Temp:  [96.3 F (35.7 C)-99.2 F (37.3 C)] 96.5 F (35.8 C) (05/07 0742) Pulse Rate:  [73-114] 91  (05/07 0742) Resp:  [11-23] 18  (05/07 0742) BP: (79-122)/(40-84) 107/45 mmHg (05/07 0742) SpO2:  [87 %-100 %] 97 % (05/07 0742) FiO2 (%):  [30 %-50 %] 40 % (05/07 0742) Weight:  [79.7 kg (175 lb 11.3 oz)-80.9 kg (178 lb 5.6 oz)] 79.7 kg (175 lb 11.3 oz) (05/06 1420) Weight change: 0 kg (0 lb)  Intake/Output from previous day: 05/06 0701 - 05/07 0700 In: 1393.7 [I.V.:1133.7; NG/GT:260] Out: -1206 [Urine:67] Intake/Output this shift:    General appearance: appears stated age, delirious and agitated, trach collar, moves all extrem Resp: rales bibasilar, rhonchi bibasilar and wheezes bibasilar Cardio: S1, S2 normal and systolic murmur: systolic ejection 2/6, decrescendo at 2nd left intercostal space GI: pos bs, ventral hernia, liver down 4 cm Skin: incisions clean  Lab Results:  Basename 01/05/12 0500 01/04/12 0450  WBC 16.6* 18.4*  HGB 7.2* 7.6*  HCT 23.2* 23.8*  PLT 542* 575*   BMET:  Basename 01/05/12 0500 01/04/12 0450  NA 141 135  K 4.1 3.6  CL 103 99  CO2 29 26  GLUCOSE 84 224*  BUN 32* 53*  CREATININE 4.27* 5.31*  CALCIUM 8.6 8.9   No results found for this basename: PTH:2 in the last 72 hours Iron Studies:  Basename 01/04/12 0851  IRON 31*  TIBC 241  TRANSFERRIN --  FERRITIN --    Studies/Results: Dg Chest Port 1 View  01/05/2012  *RADIOLOGY REPORT*  Clinical Data: Shortness of breath.  Pulmonary edema.  PORTABLE CHEST - 1 VIEW  Comparison: 01/04/2012.  Findings: The support apparatus is stable.  Persistent bilateral airspace process, likely pulmonary edema.  Possible small left effusion.  No pneumothorax.  IMPRESSION:  1.  Stable support apparatus. 2.  Persistent airspace process, likely edema.  Original Report Authenticated By: P. Loralie Champagne, M.D.   Chest Portable 1 View  To Assess Tube Placement And Rule-out Pneumothorax  01/04/2012  *RADIOLOGY REPORT*  Clinical Data: Tracheostomy placement.  Possible pneumothorax.  PORTABLE CHEST - 1 VIEW  Comparison: 01/03/2012.  Findings: Tracheostomy is midline.  Nasogastric tube has been removed.  Right IJ catheter tips project over the SVC.  Heart size stable.  There is diffuse bilateral air space disease with consolidation at the left lung base.  No definite pneumothorax.  IMPRESSION: Diffuse bilateral air space disease with consolidation at the left lung base.  No definite pneumothorax.  Original Report Authenticated By: Reyes Ivan, M.D.    I have reviewed the patient's current medications.  Assessment/Plan: 1 AKI/CKD oliguric ATN.  Will try to hold off HD Today.  Vol ok.  May need HD daily.  PC access.  Will try to get foley out.   2 Resp failure improving.   3 Anemia will give IV Fe. Cont EPO. 4 DM fair control 5 Nutrition TF to resume 6 Enceph 7 S/P MI stabel 8 low BP keep Coreg at low dose, consider switch to lopressor 9 Tibia fx P D/C foley, AB TF    LOS: 18 days   Jason Hauge L 01/05/2012,8:15 AM

## 2012-01-05 NOTE — Progress Notes (Signed)
Placed pt back in rest mode due to increased WOB and desats to low 60's

## 2012-01-05 NOTE — Progress Notes (Signed)
Name: KHAMERON GRUENWALD MRN: [id number] DOB: Sep 14, 1965    LOS: 18  PCCM PROGRESS NOTE  Active Problems:  HYPOTHYROIDISM  DIABETES MELLITUS, TYPE I  Tibial fracture  NSTEMI (non-ST elevated myocardial infarction)  Acute blood loss anemia  Acute respiratory failure  Aspiration pneumonia  Protein calorie malnutrition  Acute renal failure  Encephalopathy acute  History of Present Illness: 46 y/o with history of DM and cocaine abuse admitted with bilateral tibia fracture s/p syncopal episode on 4/19.  Postoperatively he developed acute hypoxia and refractory shock, transient ST depression and troponin leak.  Course is complicated by ARDS requiring HFOV and acute renal failure requiring CVVHD.  Lines / Drains: 4/20 OETT >>>5/6 5/6 trach (df)>>> 4/20 R IJ CVL >>> 4/20 R fem A-line >>>5/2 4/23 L IJ HD>>>Changed 4/30>>5/2 (thrombosed) 5/2   Rt IJ Diatek cath (Dickson)>>  Cultures: 4/20  Blood >>>negative 4/20  Sputum >>>negative 4/23  Blood >>> negative 4/23  Urine >>>negative 4/29  Sputum>>>negative 4/29  Urine>>>negative 4/29  Blood>>>negative   Abx: Zosyn 4/20 (empirical, G-, anaerobes) >>>4/26 Vanc 4/20 (empirical, MRSA) >>>4/26 Vanc restart 4/29>>>off Levaquin 4/24 (empirical, atypical) >>>5/3 Ceftazidime Elita Quick) 4/29>>>5/7  Tests / Events: 4/19  Admission, repair of bilat tibia Fx 4/20  Refractory shock, hypoxemic resp failure, NSTEMI.  Intubated 4/20  CT head >>> NAD 4/20  TTE >>> EF 25 to 30%, akinesis of mid/distal anteroseptal and apical area 4/21  Chest CT angio >>> No central pulmonary embolism. Slight progression of right greater than left airspace disease. 4/23  Renal US >>> no hydronephrosis, no urine obstruction 4/23  HFOV due to refractory hypoxemia.  Vasopressors.  CVVHD. 4/24  Off HFOV 4/29  Transfused 1 U blood 5/3    Tranfuse 1 unit PRBC 5/6- trach (DF)  Subjective/interval: Trach completed, did well, trach collaring  Vital Signs: Temp:  [96.3 F  (35.7 C)-99.9 F (37.7 C)] 99.9 F (37.7 C) (05/07 1100) Pulse Rate:  [73-105] 103  (05/07 0846) Resp:  [14-23] 18  (05/07 0742) BP: (86-122)/(40-66) 115/46 mmHg (05/07 0846) SpO2:  [90 %-100 %] 97 % (05/07 0742) FiO2 (%):  [40 %-50 %] 40 % (05/07 1200) Weight:  [79.7 kg (175 lb 11.3 oz)] 79.7 kg (175 lb 11.3 oz) (05/06 1420) I/O last 3 completed shifts: In: 2163.3 [I.V.:1653.3; NG/GT:510] Out: -1168 [Urine:105]  Physical Examination: General less agitated HEENT - trach no bleeding Cardiac - S1 S2 Regular, no murmurs Chest - ronchi less Abd - Soft, nontener Ext - Bilateral pitting edema Neuro - Sedated, RASS -1, intermittently agitated, opens eyes to command  Ventilator settings: Vent Mode:  [-] PRVC FiO2 (%):  [40 %-50 %] 40 % Set Rate:  [14 bmp] 14 bmp Vt Set:  [520 mL] 520 mL PEEP:  [5 cmH20] 5 cmH20 Plateau Pressure:  [22 cmH20] 22 cmH20  ASSESSMENT AND PLAN  NEUROLOGIC A:  Acute encephalopathy.  Requires high doses of Fentanyl / Versed for sedation. P: -->  Wean Fentanyl / Versed gtt , after trach still requires high dose -->  High narc exposure, add methadone (after ecg for qtc) renal dose adjusted then taper fent, goal to fent to 100 mic by am  -->  Daily WUA --> add oral benzo to dc versed drip, goal to off by am   PULMONARY  Lab 01/03/12 0435 12/30/11 0516  PHART 7.394 7.449  PCO2ART 44.4 40.3  PO2ART 85.0 69.0*  HCO3 27.0* 27.8*  O2SAT 96.0 94.0   A:  Acute respiratory failure secondary to PNA,  ARDS, acute pulmonary edema, and possible fat embolism. P: -->  Trach collar, goal 12 hours, may look to 24, re eval in pm ( abg ) pcxr , edema, more hd likely needed Consider drop cuff, PMV, SLP  CARDIOVASCULAR No results found for this basename: LATICACIDVEN:5, O2SATVEN:5 in the last 168 hours A:  NSTEMI.  Acute systolic CHF. P: -->  ASA, Coreg, Lipitor -->  No ACE due to renal failure -->  Will need cardiac cath before discharge / transfer, cards note  reviewed  RENAL  Lab 01/05/12 0500 01/04/12 0450 01/03/12 1100 01/03/12 0356 01/02/12 0442 01/01/12 0402  NA 141 135 136 136 138 --  K 4.1 3.6 -- -- -- --  CL 103 99 100 99 101 --  CO2 29 26 28 25 27  --  GLUCOSE 84 224* 228* 156* 90 --  BUN 32* 53* 30* 69* 43* --  CREATININE 4.27* 5.31* 3.70* 6.79* 4.81* --  CALCIUM 8.6 8.9 8.3* 8.5 8.6 --  MG -- -- -- -- -- --  PHOS 4.1 -- 3.5 6.1* 4.5 4.7*   A:  Acute renal failure from ATN with shock/hypoxia, IV contrast load, and DM. P: -->  HD per renal today pcxr edema?  GASTROINTESTINAL  Lab 01/05/12 0500 01/03/12 1100 01/03/12 0356 01/02/12 0442 01/01/12 0402 12/30/11 0500  AST 36 -- -- -- -- 42*  ALT 20 -- -- -- -- 32  ALKPHOS 111 -- -- -- -- 98  BILITOT 0.3 -- -- -- -- 0.3  PROT 6.1 -- -- -- -- 5.6*  ALBUMIN 2.4* 2.1* 2.2* 2.1* 2.1* --   A:  Malnutrition. P: -->  TF tolerated, holding for trach -->  GIP No peg, likely will avoid  HEMATOLOGIC  Lab 01/05/12 0500 01/04/12 1106 01/04/12 0450 01/03/12 0355 01/02/12 0441 01/01/12 1505  HCT 23.2* -- 23.8* 22.8* 24.1* 25.7*  INR -- 1.21 -- -- -- --  APTT -- 30 -- -- -- --   A:  Anemia of critical illness and chronic disease. P: -->  Aranesp -->  CBC in AM -->  Transfuse or Hb<7   INFECTIOUS  Lab 01/05/12 0500 01/04/12 0450 01/03/12 0355 01/02/12 0441 01/01/12 1505  WBC 16.6* 18.4* 15.5* 14.0* 18.3*  PROCALCITON -- -- -- -- --   A:  HCAP. P: -->  Ceftazidime (started 4/29), day 16 abx, day 9 PNA, stop today  ENDOCRINE  Lab 01/05/12 1126 01/05/12 0744 01/05/12 0438 01/05/12 0058 01/04/12 1943  GLUCAP 132* 106* 77 34* 93   A:  IDDM.  Hypothyroidism. P: -->  SSI / CBG -->  Synthroid  BEST PRACTICE / DISPOSITION -->  ICU status under PCCM -->  Cardiology / Nephrology / Ortho (tibial fracture) following -->  Partial code (NO CPR, NO defibrillation) -->  TF restarted -->  Heparin Marion for DVT Px -->  Protonix IV for GI Px -->  Ventilator bundle -->  Family  updated at bedside [facility] in am likely depends on sedation needs  Mcarthur Rossetti. Tyson Alias, MD, FACP Pgr: [phone] [facility]

## 2012-01-05 NOTE — Progress Notes (Signed)
SLP Cancellation Note  Evaluation cancelled today due to medical issues with patient which prohibited therapy. Per RN, patient remains lethargic due to sedation. Will f/u 5/8.  Ferdinand Lango MA, CCC-SLP [phone]  Khamille Beynon Meryl 01/05/2012, 2:17 PM

## 2012-01-05 NOTE — Progress Notes (Signed)
Subjective:  Patient now has trach.  Patient is mildly agitated and partially sedated.  Hemoglobin down slightly. Chest x-ray no significant change. Rhythm is sinus tachycardia.  Blood pressure stable off of pressors.  Objective:  Vital Signs in the last 24 hours: Temp:  [96.3 F (35.7 C)-99.2 F (37.3 C)] 96.5 F (35.8 C) (05/07 0742) Pulse Rate:  [73-114] 103  (05/07 0846) Resp:  [11-23] 18  (05/07 0742) BP: (79-122)/(40-84) 115/46 mmHg (05/07 0846) SpO2:  [87 %-100 %] 97 % (05/07 0742) FiO2 (%):  [30 %-50 %] 40 % (05/07 0900) Weight:  [79.7 kg (175 lb 11.3 oz)-80.9 kg (178 lb 5.6 oz)] 79.7 kg (175 lb 11.3 oz) (05/06 1420)  Intake/Output from previous day: 05/06 0701 - 05/07 0700 In: 1393.7 [I.V.:1133.7; NG/GT:260] Out: -1206 [Urine:67] Intake/Output from this shift: Total I/O In: 120 [I.V.:100; NG/GT:20] Out: -      . antiseptic oral rinse  15 mL Mouth Rinse QID  . aspirin  81 mg Per Tube Daily  . atorvastatin  10 mg Oral q1800  . carvedilol  3.125 mg Oral BID WC  . cefTAZidime (FORTAZ)  IV  1 g Intravenous Q24H  . chlorhexidine  15 mL Mouth Rinse BID  . darbepoetin (ARANESP) injection - DIALYSIS  200 mcg Subcutaneous Q Wed-HD  . desmopressin (DDAVP) IV  20 mcg Intravenous Once  . dextrose      . etomidate      . feeding supplement  30 mL Per Tube Q6H  . ferumoxytol  510 mg Intravenous Once  . heparin  40 Units/kg Dialysis Once in dialysis  . heparin  5,000 Units Subcutaneous Q8H  . insulin aspart  0-15 Units Subcutaneous Q4H  . insulin aspart  3 Units Subcutaneous Q4H  . insulin glargine  32 Units Subcutaneous Daily  . levothyroxine  50 mcg Oral QAC breakfast  . multivitamin  1 tablet Oral Daily  . nystatin   Topical BID  . pantoprazole sodium  40 mg Per Tube Q1200  . vecuronium          . sodium chloride 2 mL/hr at 12/31/11 1622  . feeding supplement (OSMOLITE 1.5 CAL) 1,000 mL (01/04/12 1607)  . fentaNYL infusion INTRAVENOUS 300 mcg/hr (01/05/12 0448)   . midazolam (VERSED) infusion 10 mg/hr (01/05/12 0445)    Physical Exam: The patient is  awake and looking around. Chest: Few rhonchi Heart: Grade 2/6 systolic ejection murmur at base.  No gallop or rub. Abdomen: No apparent tenderness Extremities:Mild pedal edema.  Lab Results:  Basename 01/05/12 0500 01/04/12 0450  WBC 16.6* 18.4*  HGB 7.2* 7.6*  PLT 542* 575*    Basename 01/05/12 0500 01/04/12 0450  NA 141 135  K 4.1 3.6  CL 103 99  CO2 29 26  GLUCOSE 84 224*  BUN 32* 53*  CREATININE 4.27* 5.31*    Basename 01/03/12 0554 01/02/12 2205  TROPONINI 0.70* 0.74*   Hepatic Function Panel  Basename 01/05/12 0500  PROT 6.1  ALBUMIN 2.4*  AST 36  ALT 20  ALKPHOS 111  BILITOT 0.3  BILIDIR --  IBILI --   No results found for this basename: CHOL in the last 72 hours No results found for this basename: PROTIME in the last 72 hours  Imaging: Imaging results have been reviewed. Chest xray shows mild CM and persistent bilateral infiltrates and mild congestion/edema Cardiac Studies: [facility]* *[facility]* [address] [place], [place] [address] [phone]  ------------------------------------------------------------ Transthoracic Echocardiography  Patient: Reginald Gutierrez, Reginald Gutierrez MR #: [id number] Study Date: 12/28/2011 Gender: M Age: 46 Height: 167.6cm Weight: 88.8kg BSA: 2.59m^2 Pt. Status: Room: 2102  Kevan Ny, MD PERFORMING [facility] ATTENDING [place], [place] SONOGRAPHER [place], RCS ADMITTING Rory Percy cc:  ------------------------------------------------------------ LV EF: 40% - 45%  ------------------------------------------------------------ Indications: Acute respiratory distress 518.82.  ------------------------------------------------------------ History: PMH: Myocardial infarction. Risk factors: Current tobacco use. Hypertension. Diabetes  mellitus. Dyslipidemia.  ------------------------------------------------------------ Study Conclusions  - Left ventricle: The cavity size was normal. Wall thickness was normal. Systolic function was mildly to moderately reduced. The estimated ejection fraction was in the range of 40% to 45%. There is hypokinesis of the mid-distal inferoposterior and apical myocardium. - Mitral valve: Mild regurgitation. - Left atrium: The atrium was mildly dilated. Transthoracic echocardiography. M-mode, complete 2D, spectral Doppler, and color Doppler. Height  Assessment/Plan:  Patient Active Hospital Problem List:  NSTEMI (non-ST elevated myocardial infarction) (12/19/2011) Presently stable rhythm and BP. Systolic murmur consistent with flow murmur secondary to anemia.  Aortic valve normal on echo. Depressed LV systolic function.  Plan: continue current meds.        LOS: 18 days    Cassell Clement 01/05/2012, 9:43 AM

## 2012-01-06 ENCOUNTER — Inpatient Hospital Stay (HOSPITAL_COMMUNITY): Payer: Medicare Other

## 2012-01-06 LAB — CBC
HCT: 24.9 % — ABNORMAL LOW (ref 39.0–52.0)
Hemoglobin: 7.6 g/dL — ABNORMAL LOW (ref 13.0–17.0)
MCH: 28 pg (ref 26.0–34.0)
MCV: 91.9 fL (ref 78.0–100.0)
RBC: 2.71 MIL/uL — ABNORMAL LOW (ref 4.22–5.81)
WBC: 17.3 10*3/uL — ABNORMAL HIGH (ref 4.0–10.5)

## 2012-01-06 LAB — GLUCOSE, CAPILLARY
Glucose-Capillary: 223 mg/dL — ABNORMAL HIGH (ref 70–99)
Glucose-Capillary: 177 mg/dL — ABNORMAL HIGH (ref 70–99)
Glucose-Capillary: 275 mg/dL — ABNORMAL HIGH (ref 70–99)
Glucose-Capillary: 133 mg/dL — ABNORMAL HIGH (ref 70–99)

## 2012-01-06 LAB — POCT I-STAT 3, ART BLOOD GAS (G3+)
Bicarbonate: 24.2 mEq/L — ABNORMAL HIGH (ref 20.0–24.0)
TCO2: 25 mmol/L (ref 0–100)
pCO2 arterial: 40.3 mmHg (ref 35.0–45.0)
pH, Arterial: 7.386 (ref 7.350–7.450)
pO2, Arterial: 85 mmHg (ref 80.0–100.0)

## 2012-01-06 LAB — RENAL FUNCTION PANEL
BUN: 52 mg/dL — ABNORMAL HIGH (ref 6–23)
CO2: 25 mEq/L (ref 19–32)
Calcium: 8.9 mg/dL (ref 8.4–10.5)
Chloride: 100 mEq/L (ref 96–112)
Creatinine, Ser: 5.59 mg/dL — ABNORMAL HIGH (ref 0.50–1.35)
Glucose, Bld: 296 mg/dL — ABNORMAL HIGH (ref 70–99)

## 2012-01-06 MED ORDER — ALTEPLASE 2 MG IJ SOLR
2.0000 mg | Freq: Once | INTRAMUSCULAR | Status: AC | PRN
  Filled 2012-01-06: qty 2

## 2012-01-06 MED ORDER — SODIUM CHLORIDE 0.9 % IV SOLN: 100.0000 mL | INTRAVENOUS | Status: DC | PRN

## 2012-01-06 MED ORDER — INSULIN GLARGINE 100 UNIT/ML ~~LOC~~ SOLN
35.0000 [IU] | Freq: Every day | SUBCUTANEOUS | Status: DC
  Administered 2012-01-06 – 2012-01-07 (×2): 35 [IU] via SUBCUTANEOUS

## 2012-01-06 MED ORDER — RISPERIDONE 1 MG/ML PO SOLN
0.5000 mg | Freq: Two times a day (BID) | ORAL | Status: DC
  Administered 2012-01-06 – 2012-01-07 (×3): 0.5 mg via ORAL
  Filled 2012-01-06 (×4): qty 0.5

## 2012-01-06 MED ORDER — HEPARIN SODIUM (PORCINE) 1000 UNIT/ML DIALYSIS
40.0000 [IU]/kg | Freq: Once | INTRAMUSCULAR | Status: AC
  Administered 2012-01-07: 3300 [IU] via INTRAVENOUS_CENTRAL

## 2012-01-06 MED ORDER — NEPRO/CARBSTEADY PO LIQD
237.0000 mL | ORAL | Status: DC | PRN
  Filled 2012-01-06: qty 237

## 2012-01-06 MED ORDER — LIDOCAINE HCL (PF) 1 % IJ SOLN: 5.0000 mL | INTRAMUSCULAR | Status: DC | PRN

## 2012-01-06 MED ORDER — LIDOCAINE-PRILOCAINE 2.5-2.5 % EX CREA
1.0000 "application " | TOPICAL_CREAM | CUTANEOUS | Status: DC | PRN
  Filled 2012-01-06: qty 5

## 2012-01-06 MED ORDER — HEPARIN SODIUM (PORCINE) 1000 UNIT/ML DIALYSIS: 1000.0000 [IU] | INTRAMUSCULAR | Status: DC | PRN

## 2012-01-06 MED ORDER — HEPARIN SODIUM (PORCINE) 1000 UNIT/ML DIALYSIS
100.0000 [IU]/kg | INTRAMUSCULAR | Status: DC | PRN
  Administered 2012-01-06: 8200 [IU] via INTRAVENOUS_CENTRAL
  Filled 2012-01-06: qty 9

## 2012-01-06 MED ORDER — FENTANYL CITRATE 0.05 MG/ML IJ SOLN
25.0000 ug | INTRAMUSCULAR | Status: DC | PRN
  Administered 2012-01-07: 100 ug via INTRAVENOUS
  Filled 2012-01-06: qty 2

## 2012-01-06 MED ORDER — PENTAFLUOROPROP-TETRAFLUOROETH EX AERO: 1.0000 "application " | INHALATION_SPRAY | CUTANEOUS | Status: DC | PRN

## 2012-01-06 NOTE — Progress Notes (Signed)
Subjective:  Patient is in no distress. Rhythm is sinus tachycardia. BP labile but satisfactory off pressors.  Still requiring partial sedation.  Objective:  Vital Signs in the last 24 hours: Temp:  [96.7 F (35.9 C)-102.6 F (39.2 C)] 102.6 F (39.2 C) (05/08 0746) Pulse Rate:  [77-138] 111  (05/08 0723) Resp:  [12-33] 22  (05/08 0723) BP: (85-152)/(40-105) 103/53 mmHg (05/08 0723) SpO2:  [84 %-100 %] 93 % (05/08 0723) FiO2 (%):  [35 %-40 %] 40 % (05/08 0800) Weight:  [81.8 kg (180 lb 5.4 oz)] 81.8 kg (180 lb 5.4 oz) (05/08 0410)  Intake/Output from previous day: 05/07 0701 - 05/08 0700 In: 1988.5 [I.V.:898.5; NG/GT:990; IV Piggyback:100] Out: 200 [Stool:200] Intake/Output from this shift: Total I/O In: 111.5 [I.V.:26.5; NG/GT:85] Out: -      . antiseptic oral rinse  15 mL Mouth Rinse QID  . aspirin  81 mg Per Tube Daily  . atorvastatin  10 mg Oral q1800  . carvedilol  3.125 mg Oral BID WC  . chlorhexidine  15 mL Mouth Rinse BID  . darbepoetin (ARANESP) injection - DIALYSIS  200 mcg Subcutaneous Q Wed-HD  . feeding supplement  30 mL Per Tube Q6H  . ferumoxytol  510 mg Intravenous Once  . heparin  40 Units/kg Dialysis Once in dialysis  . heparin  5,000 Units Subcutaneous Q8H  . insulin aspart  0-15 Units Subcutaneous Q4H  . insulin glargine  32 Units Subcutaneous Daily  . levothyroxine  50 mcg Oral QAC breakfast  . LORazepam  2 mg Oral Q8H  . methadone  5 mg Oral Q8H  . multivitamin  1 tablet Oral Daily  . nystatin   Topical BID  . pantoprazole sodium  40 mg Per Tube Q1200  . DISCONTD: cefTAZidime (FORTAZ)  IV  1 g Intravenous Q24H  . DISCONTD: insulin aspart  3 Units Subcutaneous Q4H      . sodium chloride 10 mL/hr at 01/05/12 2300  . feeding supplement (OSMOLITE 1.5 CAL) 1,000 mL (01/06/12 0720)  . fentaNYL infusion INTRAVENOUS 125 mcg/hr (01/06/12 0800)  . midazolam (VERSED) infusion 4 mg/hr (01/06/12 0800)    Physical Exam: The patient is  awake and  looking around. Chest: Few rhonchi Heart: Grade 2/6 systolic ejection murmur at base.  No gallop or rub. Abdomen: No apparent tenderness Extremities:Trace pedal edema.  Lab Results:  Basename 01/06/12 0517 01/05/12 0500  WBC 17.3* 16.6*  HGB 7.6* 7.2*  PLT 693* 542*    Basename 01/06/12 0517 01/05/12 0500  NA 137 141  K 3.7 4.1  CL 100 103  CO2 25 29  GLUCOSE 296* 84  BUN 52* 32*  CREATININE 5.59* 4.27*   No results found for this basename: TROPONINI:2,CK,MB:2 in the last 72 hours Hepatic Function Panel  Basename 01/06/12 0517 01/05/12 0500  PROT -- 6.1  ALBUMIN 2.5* --  AST -- 36  ALT -- 20  ALKPHOS -- 111  BILITOT -- 0.3  BILIDIR -- --  IBILI -- --   No results found for this basename: CHOL in the last 72 hours No results found for this basename: PROTIME in the last 72 hours  Imaging: Imaging results have been reviewed. Chest xray 01/05/12 shows mild CM and persistent bilateral infiltrates and mild congestion/edema Cardiac Studies: [facility]* *[facility]* [address] [place], [place] [address] [phone]  ------------------------------------------------------------ Transthoracic Echocardiography  Patient: Reginald, Gutierrez MR #: [id number] Study Date: 12/28/2011 Gender: M Age: 46 Height: 167.6cm  Weight: 88.8kg BSA: 2.59m^2 Pt. Status: Room: 2102  Kevan Ny, MD PERFORMING [facility] ATTENDING [place], [place] SONOGRAPHER [place], RCS ADMITTING Rory Percy cc:  ------------------------------------------------------------ LV EF: 40% - 45%  ------------------------------------------------------------ Indications: Acute respiratory distress 518.82.  ------------------------------------------------------------ History: PMH: Myocardial infarction. Risk factors: Current tobacco use. Hypertension. Diabetes  mellitus. Dyslipidemia.  ------------------------------------------------------------ Study Conclusions  - Left ventricle: The cavity size was normal. Wall thickness was normal. Systolic function was mildly to moderately reduced. The estimated ejection fraction was in the range of 40% to 45%. There is hypokinesis of the mid-distal inferoposterior and apical myocardium. - Mitral valve: Mild regurgitation. - Left atrium: The atrium was mildly dilated. Transthoracic echocardiography. M-mode, complete 2D, spectral Doppler, and color Doppler. Height  Assessment/Plan:  Patient Active Hospital Problem List:  NSTEMI (non-ST elevated myocardial infarction) (12/19/2011) Presently stable rhythm and BP. Depressed LV systolic function by echo.  Plan: continue current meds. [facility]        LOS: 19 days    Cassell Clement 01/06/2012, 8:37 AM

## 2012-01-06 NOTE — Progress Notes (Signed)
Subjective: Interval History: none.  Objective: Vital signs in last 24 hours: Temp:  [96.7 F (35.9 C)-99.9 F (37.7 C)] 99.1 F (37.3 C) (05/08 0340) Pulse Rate:  [77-138] 111  (05/08 0723) Resp:  [12-33] 22  (05/08 0723) BP: (85-152)/(40-105) 103/53 mmHg (05/08 0723) SpO2:  [84 %-100 %] 93 % (05/08 0723) FiO2 (%):  [35 %-40 %] 40 % (05/08 0723) Weight:  [81.8 kg (180 lb 5.4 oz)] 81.8 kg (180 lb 5.4 oz) (05/08 0410) Weight change: 0.9 kg (1 lb 15.8 oz)  Intake/Output from previous day: 05/07 0701 - 05/08 0700 In: 1995.5 [I.V.:905.5; NG/GT:990; IV Piggyback:100] Out: 200 [Stool:200] Intake/Output this shift:    General appearance: uncooperative and confused, agitated Resp: rales bibasilar and rhonchi bilaterally Cardio: S1, S2 normal and systolic murmur: holosystolic 2/6, blowing at apex GI: pos bs, hernia, soft Neurologic: Mental status: agitated, no coherence  Lab Results:  Basename 01/06/12 0517 01/05/12 0500  WBC 17.3* 16.6*  HGB 7.6* 7.2*  HCT 24.9* 23.2*  PLT 693* 542*   BMET:  Basename 01/06/12 0517 01/05/12 0500  NA 137 141  K 3.7 4.1  CL 100 103  CO2 25 29  GLUCOSE 296* 84  BUN 52* 32*  CREATININE 5.59* 4.27*  CALCIUM 8.9 8.6   No results found for this basename: PTH:2 in the last 72 hours Iron Studies:  Basename 01/04/12 0851  IRON 31*  TIBC 241  TRANSFERRIN --  FERRITIN --    Studies/Results: Dg Chest Port 1 View  01/05/2012  *RADIOLOGY REPORT*  Clinical Data: Shortness of breath.  Pulmonary edema.  PORTABLE CHEST - 1 VIEW  Comparison: 01/04/2012.  Findings: The support apparatus is stable.  Persistent bilateral airspace process, likely pulmonary edema.  Possible small left effusion.  No pneumothorax.  IMPRESSION:  1.  Stable support apparatus. 2.  Persistent airspace process, likely edema.  Original Report Authenticated By: P. Loralie Champagne, M.D.   Chest Portable 1 View To Assess Tube Placement And Rule-out Pneumothorax  01/04/2012  *RADIOLOGY  REPORT*  Clinical Data: Tracheostomy placement.  Possible pneumothorax.  PORTABLE CHEST - 1 VIEW  Comparison: 01/03/2012.  Findings: Tracheostomy is midline.  Nasogastric tube has been removed.  Right IJ catheter tips project over the SVC.  Heart size stable.  There is diffuse bilateral air space disease with consolidation at the left lung base.  No definite pneumothorax.  IMPRESSION: Diffuse bilateral air space disease with consolidation at the left lung base.  No definite pneumothorax.  Original Report Authenticated By: Reyes Ivan, M.D.    I have reviewed the patient's current medications.  Assessment/Plan: 1 AKI no urine, vol xs mild, will do HD daily now.   Stable k/acid base 2 Pneu resolving 3 Resp per CCM 4 Tibial fx 5 Anemia epo/fe 6 Dm fair control 7 Nutrition TF 8 AMS ?? What is baseline and recovery poss??  P HD, tf, EPO;FE, DM control.    LOS: 19 days   Reginald Gutierrez L 01/06/2012,7:46 AM

## 2012-01-06 NOTE — Progress Notes (Signed)
Clnical Child psychotherapist reviewed chart and spoke with Charity fundraiser.  This case staffed in Long Length of Stay.  CSW noted current plan is for pt to dc to [facility].  CSW to sign off at this time, please re consult if needed.   Angelia Mould, MSW, [place] [phone]

## 2012-01-06 NOTE — Progress Notes (Signed)
Name: Reginald Gutierrez MRN: [id number] DOB: Jul 29, 1966    LOS: 19  PCCM PROGRESS NOTE  Active Problems:  HYPOTHYROIDISM  DIABETES MELLITUS, TYPE I  Tibial fracture  NSTEMI (non-ST elevated myocardial infarction)  Acute blood loss anemia  Acute respiratory failure  Aspiration pneumonia  Protein calorie malnutrition  Acute renal failure  Encephalopathy acute  History of Present Illness: 46 y/o with history of DM and cocaine abuse admitted with bilateral tibia fracture s/p syncopal episode on 4/19.  Postoperatively he developed acute hypoxia and refractory shock, transient ST depression and troponin leak.  Course is complicated by ARDS requiring HFOV and acute renal failure requiring CVVHD.  Lines / Drains: 4/20 OETT >>>5/6 5/6 trach (df)>>> 4/20 R IJ CVL >>> 4/20 R fem A-line >>>5/2 4/23 L IJ HD>>>Changed 4/30>>5/2 (thrombosed) 5/2   Rt IJ Diatek cath ([place])>>  Cultures: 4/20  Blood >>>negative 4/20  Sputum >>>negative 4/23  Blood >>> negative 4/23  Urine >>>negative 4/29  Sputum>>>negative 4/29  Urine>>>negative 4/29  Blood>>>negative   Abx: Zosyn 4/20 (empirical, G-, anaerobes) >>>4/26 Vanc 4/20 (empirical, MRSA) >>>4/26 Vanc restart 4/29>>>off Levaquin 4/24 (empirical, atypical) >>>5/3 Ceftazidime Elita Quick) 4/29>>>5/7  Tests / Events: 4/19  Admission, repair of bilat tibia Fx 4/20  Refractory shock, hypoxemic resp failure, NSTEMI.  Intubated 4/20  CT head >>> NAD 4/20  TTE >>> EF 25 to 30%, akinesis of mid/distal anteroseptal and apical area 4/21  Chest CT angio >>> No central pulmonary embolism. Slight progression of right greater than left airspace disease. 4/23  Renal US >>> no hydronephrosis, no urine obstruction 4/23  HFOV due to refractory hypoxemia.  Vasopressors.  CVVHD. 4/24  Off HFOV 4/29  Transfused 1 U blood 5/3    Tranfuse 1 unit PRBC 5/6- trach (DF) 5/7- 23 hrs trach collar then desat, vemt rest  Subjective/interval:   Vital Signs: Temp:   [96.7 F (35.9 C)-102.6 F (39.2 C)] 102.6 F (39.2 C) (05/08 0746) Pulse Rate:  [77-138] 85  (05/08 1100) Resp:  [12-33] 19  (05/08 1000) BP: (85-154)/(40-105) 117/57 mmHg (05/08 1100) SpO2:  [84 %-100 %] 95 % (05/08 1100) FiO2 (%):  [35 %-40 %] 40 % (05/08 0800) Weight:  [81.8 kg (180 lb 5.4 oz)] 81.8 kg (180 lb 5.4 oz) (05/08 0410) I/O last 3 completed shifts: In: 2703.2 [I.V.:1493.2; NG/GT:1110; IV Piggyback:100] Out: 200 [Stool:200]  Physical Examination: General less agitated HEENT - trach no bleeding Cardiac - S1 S2 Regular, no murmurs Chest - ronchi mild diffuse Abd - Soft, nontener Ext - Bilateral pitting edema Neuro - Sedated,ras 0  Ventilator settings: Vent Mode:  [-] PRVC FiO2 (%):  [35 %-40 %] 40 % Set Rate:  [14 bmp] 14 bmp Vt Set:  [520 mL] 520 mL PEEP:  [5 cmH20] 5 cmH20 Plateau Pressure:  [30 cmH20-31 cmH20] 31 cmH20  ASSESSMENT AND PLAN  NEUROLOGIC A:  Acute encephalopathy.  Requires high doses of Fentanyl / Versed for sedation. P: -->  Wean Fentanyl / Versed gtt , after trach still requires high dose -->  Methadone in hopes to dc fentanyl, add prn fent --> dc versed drip --> consider addition rispirdal --> pt  PULMONARY  Lab 01/06/12 0453 01/05/12 1742 01/03/12 0435  PHART 7.386 7.348* 7.394  PCO2ART 40.3 47.8* 44.4  PO2ART 85.0 79.0* 85.0  HCO3 24.2* 26.3* 27.0*  O2SAT 96.0 95.0 96.0   A:  Acute respiratory failure secondary to PNA, ARDS, acute pulmonary edema, and possible fat embolism. P: -->  Trach collar 23 hrs  then desat, rest vent, re address in pm for goal 8 hrs, will need nocturnal vent support --> balance per renal  CARDIOVASCULAR No results found for this basename: LATICACIDVEN:5, O2SATVEN:5 in the last 168 hours A:  NSTEMI.  Acute systolic CHF. P: -->  ASA, Coreg, Lipitor -->  No ACE due to renal failure -->  D/w cards, cath in future not immediate needed  RENAL  Lab 01/06/12 0517 01/05/12 0500 01/04/12 0450 01/03/12  1100 01/03/12 0356 01/02/12 0442  NA 137 141 135 136 136 --  K 3.7 4.1 -- -- -- --  CL 100 103 99 100 99 --  CO2 25 29 26 28 25  --  GLUCOSE 296* 84 224* 228* 156* --  BUN 52* 32* 53* 30* 69* --  CREATININE 5.59* 4.27* 5.31* 3.70* 6.79* --  CALCIUM 8.9 8.6 8.9 8.3* 8.5 --  MG -- -- -- -- -- --  PHOS 4.5 4.1 -- 3.5 6.1* 4.5   A:  Acute renal failure from ATN with shock/hypoxia, IV contrast load, and DM. P: -->  HD per renal today, hd  GASTROINTESTINAL  Lab 01/06/12 0517 01/05/12 0500 01/03/12 1100 01/03/12 0356 01/02/12 0442  AST -- 36 -- -- --  ALT -- 20 -- -- --  ALKPHOS -- 111 -- -- --  BILITOT -- 0.3 -- -- --  PROT -- 6.1 -- -- --  ALBUMIN 2.5* 2.4* 2.1* 2.2* 2.1*   A:  Malnutrition. P: --> restart TF after Panda replaced --> ppi  HEMATOLOGIC  Lab 01/06/12 0517 01/05/12 0500 01/04/12 1106 01/04/12 0450 01/03/12 0355 01/02/12 0441  HCT 24.9* 23.2* -- 23.8* 22.8* 24.1*  INR -- -- 1.21 -- -- --  APTT -- -- 30 -- -- --   A:  Anemia of critical illness and chronic disease. P: -->  Aranesp -->  CBC in AM -->  Transfuse or Hb<7 Limit cbc, phlebotomy  INFECTIOUS  Lab 01/06/12 0517 01/05/12 0500 01/04/12 0450 01/03/12 0355 01/02/12 0441  WBC 17.3* 16.6* 18.4* 15.5* 14.0*  PROCALCITON -- -- -- -- --   A:  HCAP. P: --> no further abx , s/p full course  ENDOCRINE  Lab 01/06/12 0745 01/06/12 0340 01/05/12 2336 01/05/12 1950 01/05/12 1537  GLUCAP 223* 331* 189* 141* 110*   A:  IDDM.  Hypothyroidism. P: -->  SSI / CBG -->  Synthroid  BEST PRACTICE / DISPOSITION -->  ICU status under PCCM -->  Partial code, i will revisit this, full code? As he has made progress  -->  TF restarted -->  Heparin [place] for DVT Px -->  Protonix IV for GI Px -->  Ventilator bundle Select in am likely depends on sedation needs Likely to select in am   Mcarthur Rossetti. Tyson Alias, MD, FACP Pgr: [phone] [facility]

## 2012-01-06 NOTE — Progress Notes (Signed)
Physical Therapy Treatment Patient Details Name: Reginald Gutierrez MRN: [id number] DOB: 27-Dec-1965 Today's Date: 01/06/2012 Time: 1435-1450 PT Time Calculation (min): 15 min  PT Assessment / Plan / Recommendation Comments on Treatment Session  Pt tolerated treatment well but limited due to not following commands consistently and problelm solving and proprioception decreased.     Follow Up Recommendations  Other (comment) (TBD with progress)    Equipment Recommendations  Other (comment) (TBD)    Frequency Min 1X/week   Plan Discharge plan remains appropriate;Frequency remains appropriate    Precautions / Restrictions Precautions Precaution Comments: vent Restrictions Weight Bearing Restrictions: Yes RLE Weight Bearing: Non weight bearing LLE Weight Bearing: Non weight bearing   Pertinent Vitals/Pain No signs of pain with exercises    Mobility  Bed Mobility Bed Mobility: Not assessed Transfers Transfers: Not assessed Ambulation/Gait Ambulation/Gait Assistance: Not tested (comment) Stairs: No Wheelchair Mobility Wheelchair Mobility: No    Exercises General Exercises - Upper Extremity Shoulder Flexion: AROM;10 reps;Both;Supine Elbow Flexion: AROM;10 reps;Both;Supine General Exercises - Lower Extremity Ankle Circles/Pumps: AROM;Both;5 reps;Supine Short Arc Quad: AROM;Both;10 reps;Strengthening;Supine Long Arc Quad: AAROM;5 reps;Both;Supine Hip ABduction/ADduction: AAROM;Strengthening;Both;10 reps;Supine Straight Leg Raises: AAROM;Both;5 reps;Supine Other Exercises Other Exercises: touching target with hand and feet, has difficulty aiming and achieving target   PT Goals Acute Rehab PT Goals PT Goal Formulation: Patient unable to participate in goal setting Potential to Achieve Goals: Fair Additional Goals Additional Goal #1: pt will follow commands 50% of session for simple one step motor commands PT Goal: Additional Goal #1 - Progress: Progressing toward goal Additional  Goal #2: Pt will demonstrate AA/ROM for all joints of bil LE 5 repetitions each with min assist PT Goal: Additional Goal #2 - Progress: Progressing toward goal  Visit Information  Last PT Received On: 01/06/12 Assistance Needed: +1    Subjective Data  Subjective: pt unable to state sedated on vent   Cognition  Overall Cognitive Status: Difficult to assess Difficult to assess due to: Other (comment) (ventilated) Arousal/Alertness: Awake/alert Behavior During Session: Restless Cognition - Other Comments: pt able to follow one step motor commands inconsistently    Balance  Balance Balance Assessed: No  End of Session PT - End of Session Activity Tolerance: Treatment limited secondary to agitation (limited by cognitive status) Patient left: in bed;with nursing in room;with call bell/phone within reach Nurse Communication: Other (comment) (ther ex)   Lyanne Co, PT  Acute Rehab Services  [phone]  Lyanne Co 01/06/2012, 3:38 PM

## 2012-01-06 NOTE — Progress Notes (Signed)
SLP Cancellation Note  Treatment cancelled today due to medical issues with patient which prohibited therapy.  Pt on ventilator.  Will f/u 5/9 for readiness for PMSV.  Reginald Gutierrez, [place] CCC/SLP Pager [phone]   Reginald Gutierrez 01/06/2012, 11:42 AM

## 2012-01-07 ENCOUNTER — Inpatient Hospital Stay (HOSPITAL_COMMUNITY): Payer: Medicare Other

## 2012-01-07 ENCOUNTER — Inpatient Hospital Stay
Admission: RE | Admit: 2012-01-07 | Discharge: 2012-02-03 | Disposition: A | Discharge status: Skilled Nursing Facility | Payer: Medicare Other | Source: Ambulatory Visit | Attending: Internal Medicine | Admitting: Internal Medicine

## 2012-01-07 DIAGNOSIS — J9620 Acute and chronic respiratory failure, unspecified whether with hypoxia or hypercapnia: Secondary | ICD-10-CM

## 2012-01-07 DIAGNOSIS — D62 Acute posthemorrhagic anemia: Secondary | ICD-10-CM

## 2012-01-07 DIAGNOSIS — J690 Pneumonitis due to inhalation of food and vomit: Secondary | ICD-10-CM

## 2012-01-07 DIAGNOSIS — R0902 Hypoxemia: Secondary | ICD-10-CM

## 2012-01-07 DIAGNOSIS — J9600 Acute respiratory failure, unspecified whether with hypoxia or hypercapnia: Secondary | ICD-10-CM

## 2012-01-07 DIAGNOSIS — N179 Acute kidney failure, unspecified: Secondary | ICD-10-CM

## 2012-01-07 DIAGNOSIS — G4733 Obstructive sleep apnea (adult) (pediatric): Secondary | ICD-10-CM

## 2012-01-07 DIAGNOSIS — Z930 Tracheostomy status: Secondary | ICD-10-CM

## 2012-01-07 DIAGNOSIS — G9340 Encephalopathy, unspecified: Secondary | ICD-10-CM

## 2012-01-07 LAB — CBC
HCT: 24.3 % — ABNORMAL LOW (ref 39.0–52.0)
MCH: 28.4 pg (ref 26.0–34.0)
MCV: 93.1 fL (ref 78.0–100.0)
Platelets: 641 10*3/uL — ABNORMAL HIGH (ref 150–400)
RBC: 2.61 MIL/uL — ABNORMAL LOW (ref 4.22–5.81)

## 2012-01-07 LAB — RENAL FUNCTION PANEL
Albumin: 2.4 g/dL — ABNORMAL LOW (ref 3.5–5.2)
BUN: 42 mg/dL — ABNORMAL HIGH (ref 6–23)
CO2: 28 mEq/L (ref 19–32)
Chloride: 99 mEq/L (ref 96–112)
GFR calc Af Amer: 20 mL/min — ABNORMAL LOW (ref >90)
Glucose, Bld: 186 mg/dL — ABNORMAL HIGH (ref 70–99)
Phosphorus: 2.7 mg/dL (ref 2.3–4.6)

## 2012-01-07 LAB — GLUCOSE, CAPILLARY
Glucose-Capillary: 156 mg/dL — ABNORMAL HIGH (ref 70–99)
Glucose-Capillary: 199 mg/dL — ABNORMAL HIGH (ref 70–99)
Glucose-Capillary: 210 mg/dL — ABNORMAL HIGH (ref 70–99)
Glucose-Capillary: 329 mg/dL — ABNORMAL HIGH (ref 70–99)

## 2012-01-07 MED ORDER — NYSTATIN 100000 UNIT/GM EX POWD: CUTANEOUS | Status: DC

## 2012-01-07 MED ORDER — FENTANYL CITRATE 0.05 MG/ML IJ SOLN
50.0000 ug | INTRAMUSCULAR | Status: DC | PRN
  Administered 2012-01-07: 100 ug via INTRAVENOUS
  Filled 2012-01-07: qty 2

## 2012-01-07 MED ORDER — PENTAFLUOROPROP-TETRAFLUOROETH EX AERO: 1.0000 "application " | INHALATION_SPRAY | CUTANEOUS | Status: DC | PRN

## 2012-01-07 MED ORDER — HEPARIN SODIUM (PORCINE) 1000 UNIT/ML DIALYSIS: 1000.0000 [IU] | INTRAMUSCULAR | Status: DC | PRN

## 2012-01-07 MED ORDER — ASPIRIN 81 MG PO CHEW: 81.0000 mg | CHEWABLE_TABLET | Freq: Every day | ORAL | Status: DC

## 2012-01-07 MED ORDER — BIOTENE DRY MOUTH MT LIQD: 15.0000 mL | Freq: Four times a day (QID) | OROMUCOSAL | Status: DC

## 2012-01-07 MED ORDER — LORAZEPAM 2 MG PO TABS: 2.0000 mg | ORAL_TABLET | Freq: Three times a day (TID) | ORAL | Status: AC

## 2012-01-07 MED ORDER — ACETAMINOPHEN 160 MG/5ML PO SOLN: 650.0000 mg | ORAL | Status: AC | PRN

## 2012-01-07 MED ORDER — CARVEDILOL 3.125 MG PO TABS: 3.1250 mg | ORAL_TABLET | Freq: Every day | ORAL | Status: DC

## 2012-01-07 MED ORDER — HEPARIN SODIUM (PORCINE) 5000 UNIT/ML IJ SOLN: 5000.0000 [IU] | Freq: Three times a day (TID) | INTRAMUSCULAR | Status: DC

## 2012-01-07 MED ORDER — CHLORHEXIDINE GLUCONATE 0.12 % MT SOLN: 15.0000 mL | Freq: Two times a day (BID) | OROMUCOSAL | Status: AC

## 2012-01-07 MED ORDER — INSULIN GLARGINE 100 UNIT/ML ~~LOC~~ SOLN: 38.0000 [IU] | Freq: Every day | SUBCUTANEOUS | Status: DC

## 2012-01-07 MED ORDER — NEPRO/CARBSTEADY PO LIQD: 237.0000 mL | ORAL | Status: DC | PRN

## 2012-01-07 MED ORDER — CARVEDILOL 3.125 MG PO TABS
3.1250 mg | ORAL_TABLET | Freq: Every day | ORAL | Status: DC
  Filled 2012-01-07: qty 1

## 2012-01-07 MED ORDER — OSMOLITE 1.5 CAL PO LIQD: 1000.0000 mL | ORAL | Status: DC

## 2012-01-07 MED ORDER — INSULIN ASPART 100 UNIT/ML ~~LOC~~ SOLN: 0.0000 [IU] | SUBCUTANEOUS | Status: DC

## 2012-01-07 MED ORDER — ATORVASTATIN CALCIUM 10 MG PO TABS: 10.0000 mg | ORAL_TABLET | Freq: Every day | ORAL | Status: DC

## 2012-01-07 MED ORDER — RENA-VITE PO TABS: 1.0000 | ORAL_TABLET | Freq: Every day | ORAL | Status: DC

## 2012-01-07 MED ORDER — MIDAZOLAM HCL 2 MG/2ML IJ SOLN
2.0000 mg | Freq: Once | INTRAMUSCULAR | Status: AC
Start: 2012-01-07 — End: 2012-01-07
  Administered 2012-01-07: 2 mg via INTRAVENOUS

## 2012-01-07 MED ORDER — LIDOCAINE HCL (PF) 1 % IJ SOLN: 5.0000 mL | INTRAMUSCULAR | Status: DC | PRN

## 2012-01-07 MED ORDER — RISPERIDONE 1 MG/ML PO SOLN: 0.5000 mg | Freq: Two times a day (BID) | ORAL | Status: DC

## 2012-01-07 MED ORDER — LEVOTHYROXINE SODIUM 50 MCG PO TABS: 50.0000 ug | ORAL_TABLET | Freq: Every day | ORAL | Status: DC

## 2012-01-07 MED ORDER — PANTOPRAZOLE SODIUM 40 MG PO PACK: 40.0000 mg | PACK | Freq: Every day | ORAL | Status: DC

## 2012-01-07 MED ORDER — DARBEPOETIN ALFA-POLYSORBATE 200 MCG/0.4ML IJ SOLN: 200.0000 ug | INTRAMUSCULAR | Status: DC

## 2012-01-07 MED ORDER — PRO-STAT SUGAR FREE PO LIQD: 30.0000 mL | Freq: Four times a day (QID) | ORAL | Status: DC

## 2012-01-07 MED ORDER — METHADONE HCL 5 MG PO TABS: 5.0000 mg | ORAL_TABLET | Freq: Three times a day (TID) | ORAL | Status: AC

## 2012-01-07 MED ORDER — LIDOCAINE-PRILOCAINE 2.5-2.5 % EX CREA: 1.0000 "application " | TOPICAL_CREAM | CUTANEOUS | Status: DC | PRN

## 2012-01-07 MED ORDER — MIDAZOLAM HCL 2 MG/2ML IJ SOLN
2.0000 mg | INTRAMUSCULAR | Status: DC | PRN
  Administered 2012-01-07: 2 mg via INTRAVENOUS
  Filled 2012-01-07: qty 4
  Filled 2012-01-07: qty 2

## 2012-01-07 NOTE — Progress Notes (Addendum)
Inpatient Diabetes Program Recommendations  AACE/ADA: New Consensus Statement on Inpatient Glycemic Control (2009)  Target Ranges:  Prepandial:   less than 140 mg/dL      Peak postprandial:   less than 180 mg/dL (1-2 hours)      Critically ill patients:  140 - 180 mg/dL   Reason for Visit: Results for Reginald Gutierrez, Reginald Gutierrez (MRN [id number]) as of 01/07/2012 09:37  Ref. Range 01/06/2012 15:42 01/06/2012 19:35 01/06/2012 23:51 01/07/2012 03:31 01/07/2012 07:20  Glucose-Capillary Latest Range: 70-99 mg/dL 782 (H) 956 (H) 213 (H) 322 (H) 329 (H)       Note: Note CBG's greater than goal.  On 01/04/12 patient had hypoglycemic event when tube feeds were held, however tube feeds now back at goal and CBG's greater than 300 mg/dL. Note that Lantus was increased today. Do not recommend further increase of Lantus. Please add Novolog tube feed coverage 3 units q 4 hours (to be held if tube feeds held for any reason).  Also, patient would benefit from PRN D10 order if tube feeds held in order to prevent hypoglycemia.  Will follow.

## 2012-01-07 NOTE — Evaluation (Signed)
[facility] Speaking Valve - Evaluation Patient Details  Name: Reginald Gutierrez MRN: [id number] Date of Birth: 28-Feb-1966  Today's Date: 01/07/2012 Time: 4098-1191 SLP Time Calculation (min): 35 min  Past Medical History:  Past Medical History  Diagnosis Date  . Diabetes mellitus type 1   . Hyperlipidemia   . Hypothyroidism   . Depression   . Umbilical hernia   . Hypertension    Past Surgical History:  Past Surgical History  Procedure Date  . Appendectomy     open  . Tibia im nail insertion 12/18/2011    Procedure: INTRAMEDULLARY (IM) NAIL TIBIAL;  Surgeon: Eugenia Mcalpine, MD;  Location: [facility] ORS;  Service: Orthopedics;  Laterality: Bilateral;  . Insertion of dialysis catheter 12/31/2011    Procedure: INSERTION OF DIALYSIS CATHETER;  Surgeon: Chuck Hint, MD;  Location: [facility];  Service: Vascular;  Laterality: N/A;   HPI:  46 yr old admitted after syncopal episode.  Found to have NSTEMI.  Intubated from admission to initiation of trach trials 5/7, vent at night.  Also sustained bilateral tibia fractures during the fall.    Assessment / Plan / Recommendation Clinical Impression  PMSV assessement completed with pt. in recliner, awake but groggy, with decreased sustained attention and mouthing words (stimulable to SLP's cues).  Tolerated PMSV intermittently for 20-25 minutes with SpO2 96%-98%, HR 101-105, RR 21-26.  He speaks in sentences intermittently with only 10% intelligibility due to decreased respiratory support resulting in 1-2 words audible words.   No evidence of CO2 retention.  Pt.  exhibiting cognitive deficits likely due to hypoxia and requires mod-max verbal and visual assist.  Prognosis for a functional use of PMSV is good.   Recommend use of PMSV with SLP only.    SLP Assessment  Patient needs continued Speech Lanaguage Pathology Services    Follow Up Recommendations   (LTAC)    Frequency and Duration min 3x week  2 weeks   Pertinent Vitals/Pain     SLP  Goals Potential to Achieve Goals: Good Potential Considerations: Other (comment) (hypoxia) SLP Goal #1: Pt. will demonstrate ability to wear PMSV with all vital signs stable for 3 hours with intermittent supervision and mod verbal cues SLP Goal #2: Pt. will use compensatory strategies wearing PMSV for increased speech intelligibilty in sentences to 75% with mod verbal cues.   PMSV Trial  PMSV was placed for: ability to redirect air to upper airway for phonation, adequate pressures for cough and oral expectoration Able to redirect subglottic air through upper airway: Yes Able to Attain Phonation: Yes Voice Quality: Low vocal intensity;Hoarse Able to Expectorate Secretions: Yes Level of Secretion Expectoration with PMSV: Tracheal;Other (comment) (minimal) Breath Support for Phonation: Severely decreased Intelligibility: Intelligibility reduced Word: 0-24% accurate Phrase: 0-24% accurate Sentence: Not tested Conversation: Not tested Respirations During Trial: 24  SpO2 During Trial: 96 %   Tracheostomy Tube       Vent Dependency  FiO2 (%): 40 %    Cuff Deflation Trial Tolerated Cuff Deflation: Yes Length of Time for Cuff Deflation Trial: 30 min Behavior: Cooperative;Poor eye contact;Other (comment) (awake but groggy)   Reginald Gutierrez.Ed [employer] [phone]  01/07/2012

## 2012-01-07 NOTE — Discharge Summary (Signed)
Physician Discharge Summary  Patient ID: Reginald Gutierrez MRN: [id number] DOB/AGE: 46-Aug-1967 46 y.o.  Admit date: 12/18/2011 Discharge date: 01/07/2012    Discharge Diagnoses:   HYPOTHYROIDISM DIABETES MELLITUS, TYPE I Tibial fracture NSTEMI (non-ST elevated myocardial infarction) Acute blood loss anemia Acute respiratory failure Aspiration pneumonia Protein calorie malnutrition Acute renal failure Encephalopathy acute Dysphagia Protein Calorie Malnutrition ARDS  Brief Summary: Reginald Gutierrez is a 46 y.o. y/o male with a PMH of DM and cocaine abuse admitted with bilateral tibia fracture s/p syncopal episode on 4/19. Postoperatively he developed acute hypoxia and refractory shock, transient ST depression and troponin leak. Course is complicated by ARDS requiring HFOV and acute renal failure requiring CVVHD.  Tibia fractures repaired per Orthopedics.  Slow weaning from vent, required HFOV, and ultimately tracheostomy placement. Currently has been able to tolerate some ATC but will need slow wean for tolerance.  See data below for dates, cultures and antibiotics.   Hospital Summary by Discharge Diagnosis   Bilateral Tibia Fractures Suspicious circumstances surrounding injury (bilateral breaks at same level).  ? Syncopal episode and fell per report.     Underwent IM nails to bilateral tibial fractures.  PT consulted for rehab efforts.  Fractures currently stable per Ortho.  Recommend continuing PT efforts.   Acute Respiratory Failure / Aspiration PNA Acute respiratory failure in setting of post operative shock after tibia fractures.  Required prolonged intubation in setting of shock, renal failure and HCAP.  He required prolonged mechanical ventilation and HFOV.  He ultimately required tracheostomy placement per Dr. Tyson Alias for further supportive efforts.  He was further able to be weaned to ATC.  He tolerated ATC for 23 hours on 5/7 however, due to fatigue he required mechanical ventilation  on 5/8.  At this time he is tolerating ATC.  Current recommendations are for ATC as tolerated during the day and mechanical ventilation at night.   Acute Renal Failure Acute renal failure in setting of ATN with shock, hypoxia, IV contrast load, and DM.  Required CVVHD while in ICU initially.  Currently has been transitioned to HD 4 days per week.  It is highly likely that could transition to 3 days after review of Nephrologist in [facility] and clinical status.  NSTEMI / Hyperlipidemia Pt noted to have chest pain with positive troponin on admit.  Cardiology consulted and opted for medical management. EF 40-45%, hypokinesis of mid-distal inferoposterior and apical myocardium.  Current regimen -asa, lipitor, coreg -recommend continue at time of transfer.     Anemia of Critical / Chronic Illness / Blood loss Transfused PRBC's during initial ICU stay.  Current Hgb/Hct stable.  On Epogen per Nephrology.    Hypothyroidism / DM Maintained on SSI and synthroid during admission.  Recommend continue both as ordered.  Last TSH on 4/27 3.746.    Acute Encephalopathy Secondary to critical illness, prolonged ICU stay and difficult to wean off sedation.  Methadone and Risperdal used for transition in setting of cocaine abuse. Recommend continuing current dosing and slowly weaning off as he improves.   Dysphagia / Malnutrition In setting of critical illness, mechanical ventilation.  He was supported nutritionally with TF during ICU stay.  It is recommended to continue current feeding as nutrition support until he is more stable from a respiratory stand point and tolerate PMV.     Lines / Drains:  4/20 OETT >>>5/6  5/6 trach (df)>>>  4/20 R IJ CVL >>>plan 5/9  4/20 R fem A-line >>>5/2  4/23 L IJ HD>>>Changed  4/30>>5/2 (thrombosed)  5/2 Rt IJ Diatek cath (Dickson)>>   Cultures:  4/20 Blood >>>negative  4/20 Sputum >>>negative  4/23 Blood >>> negative  4/23 Urine >>>negative  4/29 Sputum>>>negative    4/29 Urine>>>negative  4/29 Blood>>>negative   Abx:  Zosyn 4/20 (empirical, G-, anaerobes) >>>4/26  Vanc 4/20 (empirical, MRSA) >>>4/26  Vanc restart 4/29>>>off  Levaquin 4/24 (empirical, atypical) >>>5/3  Ceftazidime Elita Quick) 4/29>>>5/7   Tests / Events:  4/19 Admission, repair of bilat tibia Fx  4/20 Refractory shock, hypoxemic resp failure, NSTEMI. Intubated  4/20 CT head >>> NAD  4/20 TTE >>> EF 25 to 30%, akinesis of mid/distal anteroseptal and apical area 4/21 Chest CT angio >>> No central pulmonary embolism. Slight progression of right greater than left airspace disease. 4/23 Renal US >>> no hydronephrosis, no urine obstruction  4/23 HFOV due to refractory hypoxemia. Vasopressors. CVVHD.  4/24 Off HFOV  4/29 Transfused 1 U blood  5/3 Tranfuse 1 unit PRBC  5/6- trach (DF)  5/7- 23 hrs trach collar then desat, vemt rest  5/9- back to trach collar, off continuous sedation   Discharge Exam: General less agitated, in chair  HEENT - trach c/d/i, mm pink /moist Cardiac - S1 S2 Regular, no murmurs  Chest - reduced bases  Abd - Soft, nontender  Ext - Bilateral pitting edema unchanged  Neuro - rass 1      Discharge Labs  BMET  Lab 01/06/12 0517 01/05/12 0500 01/04/12 0450 01/03/12 1100 01/03/12 0356 01/02/12 0442  NA 137 141 135 136 136 --  K 3.7 4.1 -- -- -- --  CL 100 103 99 100 99 --  CO2 25 29 26 28 25  --  GLUCOSE 296* 84 224* 228* 156* --  BUN 52* 32* 53* 30* 69* --  CREATININE 5.59* 4.27* 5.31* 3.70* 6.79* --  CALCIUM 8.9 8.6 8.9 8.3* 8.5 --  MG -- -- -- -- -- --  PHOS 4.5 4.1 -- 3.5 6.1* 4.5     CBC   Lab 01/06/12 0517 01/05/12 0500 01/04/12 0450  HGB 7.6* 7.2* 7.6*  HCT 24.9* 23.2* 23.8*  WBC 17.3* 16.6* 18.4*  PLT 693* 542* 575*   Anti-Coagulation  Lab 01/04/12 1106  INR 1.21      Discharge Orders    Future Appointments: Provider: Department: Dept Phone: Center:   01/26/2012 11:45 AM Atilano Ina, MD,FACS [facility] [phone]  None     Future Orders Please Complete By Expires   Increase activity slowly      Discharge instructions      Comments:   Leave all current IV's, lines in place for discharge.          Medication List  As of 01/07/2012  2:50 PM   START taking these medications         acetaminophen 160 MG/5ML solution   Commonly known as: TYLENOL   Place 20.3 mLs (650 mg total) into feeding tube every 4 (four) hours as needed for fever.      antiseptic oral rinse Liqd   15 mLs by Mouth Rinse route QID.      aspirin 81 MG chewable tablet   Place 1 tablet (81 mg total) into feeding tube daily.      carvedilol 3.125 MG tablet   Commonly known as: COREG   Take 1 tablet (3.125 mg total) by mouth daily with breakfast.      chlorhexidine 0.12 % solution   Commonly known as: PERIDEX   Use as directed  15 mLs in the mouth or throat 2 (two) times daily.      darbepoetin 200 MCG/0.4ML Soln   Commonly known as: ARANESP   Inject 0.4 mLs (200 mcg total) into the skin every Wednesday with hemodialysis.      * feeding supplement (NEPRO CARB STEADY) Liqd   Take 237 mLs by mouth as needed (missed meal during dialysis.).      * feeding supplement (OSMOLITE 1.5 CAL) Liqd   Place 1,000 mLs into feeding tube continuous.      feeding supplement Liqd   Place 30 mLs into feeding tube every 6 (six) hours.      * heparin 1000 unit/mL Soln injection   1 mL (1,000 Units total) by Dialysis route as needed (in dialysis).      * heparin 5000 UNIT/ML injection   Inject 1 mL (5,000 Units total) into the skin every 8 (eight) hours.      insulin aspart 100 UNIT/ML injection   Commonly known as: novoLOG   Inject 0-15 Units into the skin every 4 (four) hours.      lidocaine 1 % Soln injection   Commonly known as: XYLOCAINE   Inject 5 mLs into the skin as needed (topical anesthesia for hemodialysis ifGEBAUERS is ineffective.).      lidocaine-prilocaine cream   Commonly known as: EMLA   Apply 1 application  topically as needed (topical anesthesia for hemodialysis if Gebauers and Lidocaine injection are ineffective.).      LORazepam 2 MG tablet   Commonly known as: ATIVAN   Take 1 tablet (2 mg total) by mouth every 8 (eight) hours.      methadone 5 MG tablet   Commonly known as: DOLOPHINE   Take 1 tablet (5 mg total) by mouth every 8 (eight) hours.      multivitamin Tabs tablet   Take 1 tablet by mouth daily.      nystatin 100000 UNIT/GM Powd   1 application BID      pantoprazole sodium 40 mg/20 mL Pack   Commonly known as: PROTONIX   Place 20 mLs (40 mg total) into feeding tube daily at 12 noon.      pentafluoroprop-tetrafluoroeth Aero   Commonly known as: GEBAUERS   Apply 1 application topically as needed (topical anesthesia for hemodialysis).      risperiDONE 1 MG/ML oral solution   Commonly known as: RISPERDAL   Take 0.5 mLs (0.5 mg total) by mouth 2 (two) times daily.     * Notice: This list has 4 medication(s) that are the same as other medications prescribed for you. Read the directions carefully, and ask your doctor or other care provider to review them with you.       CHANGE how you take these medications         atorvastatin 10 MG tablet   Commonly known as: LIPITOR   Take 1 tablet (10 mg total) by mouth daily at 6 PM.   What changed: - medication strength - dose - how often to take the med      insulin glargine 100 UNIT/ML injection   Commonly known as: LANTUS   Inject 38 Units into the skin daily.   What changed: - dose - how often to take the med      levothyroxine 50 MCG tablet   Commonly known as: SYNTHROID, LEVOTHROID   Take 1 tablet (50 mcg total) by mouth daily before breakfast.   What changed: - medication strength - dose -  how often to take the med         STOP taking these medications         buPROPion 150 MG 12 hr tablet      insulin lispro 100 UNIT/ML injection      lisinopril 40 MG tablet          Where to get your medications      These are the prescriptions that you need to pick up.   You may get these medications from any pharmacy.         LORazepam 2 MG tablet   methadone 5 MG tablet         Information on where to get these meds is not yet available. Ask your nurse or doctor.         acetaminophen 160 MG/5ML solution   antiseptic oral rinse Liqd   aspirin 81 MG chewable tablet   atorvastatin 10 MG tablet   carvedilol 3.125 MG tablet   chlorhexidine 0.12 % solution   darbepoetin 200 MCG/0.4ML Soln   feeding supplement (NEPRO CARB STEADY) Liqd   feeding supplement (OSMOLITE 1.5 CAL) Liqd   feeding supplement Liqd   heparin 1000 unit/mL Soln injection   heparin 5000 UNIT/ML injection   insulin aspart 100 UNIT/ML injection   insulin glargine 100 UNIT/ML injection   levothyroxine 50 MCG tablet   lidocaine 1 % Soln injection   lidocaine-prilocaine cream   multivitamin Tabs tablet   nystatin 100000 UNIT/GM Powd   pantoprazole sodium 40 mg/20 mL Pack   pentafluoroprop-tetrafluoroeth Aero   risperiDONE 1 MG/ML oral solution             Disposition:  To [facility] for rehab efforts and continued mechanical ventilation weaning.    Discharged Condition: Reginald Gutierrez has met maximum benefit of inpatient care and is medically stable and cleared for discharge to [facility].  Outpatient follow up will be determined at time of discharge per [facility].    Time spent on disposition:  Greater than 35 minutes.   Signed: [name], NP-C [facility] Pgr: [phone]   Agree with above, fully examined  Mcarthur Rossetti. Tyson Alias, MD, FACP Pgr: [phone] [place] [facility]

## 2012-01-07 NOTE — Progress Notes (Signed)
Clinical Social Worker received notification from [facility] SW-Veronica that pt's family requested to speak with this CSW regarding dc plan.  CSW met with pt's mom and sister; pt currently in chair-not able to fully participate in conversation.  CSW provided opportunity for family's questions/concenrs to be addressed.  CSW relayed questions/concerns to [facility] representative.  CSW to sign off at this time.  Please re consult if needed.   Angelia Mould, MSW, [place] [phone]

## 2012-01-07 NOTE — Progress Notes (Signed)
Subjective:  Patient is sitting up in a chair.  Speech therapy evaluation in progress.  He appears to be in no acute distress. Objective:  Vital Signs in the last 24 hours: Temp:  [98.1 F (36.7 C)-101.4 F (38.6 C)] 99.8 F (37.7 C) (05/09 0800) Pulse Rate:  [85-123] 107  (05/09 0813) Resp:  [16-27] 24  (05/09 0756) BP: (100-151)/(44-90) 119/52 mmHg (05/09 0813) SpO2:  [84 %-100 %] 97 % (05/09 0756) FiO2 (%):  [40 %] 40 % (05/09 0756) Weight:  [80.5 kg (177 lb 7.5 oz)-81.8 kg (180 lb 5.4 oz)] 80.5 kg (177 lb 7.5 oz) (05/08 1953)  Intake/Output from previous day: 05/08 0701 - 05/09 0700 In: 1452.5 [I.V.:407.5; NG/GT:1045] Out: 3175 [Stool:175] Intake/Output from this shift:       . antiseptic oral rinse  15 mL Mouth Rinse QID  . aspirin  81 mg Per Tube Daily  . atorvastatin  10 mg Oral q1800  . carvedilol  3.125 mg Oral BID WC  . chlorhexidine  15 mL Mouth Rinse BID  . darbepoetin (ARANESP) injection - DIALYSIS  200 mcg Subcutaneous Q Wed-HD  . feeding supplement  30 mL Per Tube Q6H  . heparin  40 Units/kg Dialysis Once in dialysis  . heparin  5,000 Units Subcutaneous Q8H  . insulin aspart  0-15 Units Subcutaneous Q4H  . insulin glargine  35 Units Subcutaneous Daily  . levothyroxine  50 mcg Oral QAC breakfast  . LORazepam  2 mg Oral Q8H  . methadone  5 mg Oral Q8H  . midazolam  2 mg Intravenous Once  . multivitamin  1 tablet Oral Daily  . nystatin   Topical BID  . pantoprazole sodium  40 mg Per Tube Q1200  . risperiDONE  0.5 mg Oral BID  . DISCONTD: insulin glargine  32 Units Subcutaneous Daily      . sodium chloride Stopped (01/07/12 0400)  . feeding supplement (OSMOLITE 1.5 CAL) 1,000 mL (01/06/12 0720)  . DISCONTD: fentaNYL infusion INTRAVENOUS 125 mcg/hr (01/06/12 0800)  . DISCONTD: midazolam (VERSED) infusion 4 mg/hr (01/06/12 0800)    Physical Exam: The patient is  awake and looking around. Chest: Bibasilar rhonchi and expiratory wheeze Heart: Grade  2/6 systolic ejection murmur at base.  No gallop or rub. Abdomen: No apparent tenderness Extremities:Trace pedal edema.  Lab Results:  Basename 01/06/12 0517 01/05/12 0500  WBC 17.3* 16.6*  HGB 7.6* 7.2*  PLT 693* 542*    Basename 01/06/12 0517 01/05/12 0500  NA 137 141  K 3.7 4.1  CL 100 103  CO2 25 29  GLUCOSE 296* 84  BUN 52* 32*  CREATININE 5.59* 4.27*   No results found for this basename: TROPONINI:2,CK,MB:2 in the last 72 hours Hepatic Function Panel  Basename 01/06/12 0517 01/05/12 0500  PROT -- 6.1  ALBUMIN 2.5* --  AST -- 36  ALT -- 20  ALKPHOS -- 111  BILITOT -- 0.3  BILIDIR -- --  IBILI -- --   No results found for this basename: CHOL in the last 72 hours No results found for this basename: PROTIME in the last 72 hours  Imaging: Imaging results have been reviewed. Chest xray 01/05/12 shows mild CM and persistent bilateral infiltrates and mild congestion/edema Cardiac Studies: [facility]* *[facility]* [address] [place], [place] [address] [phone]  ------------------------------------------------------------ Transthoracic Echocardiography  Patient: Keelyn, Fjelstad MR #: [id number] Study Date: 12/28/2011 Gender: M Age: 46 Height: 167.6cm Weight: 88.8kg BSA: 2.56m^2 Pt.  Status: Room: 2102  Kevan Ny, MD PERFORMING [facility] ATTENDING [place], [place] SONOGRAPHER [place], RCS ADMITTING Rory Percy cc:  ------------------------------------------------------------ LV EF: 40% - 45%  ------------------------------------------------------------ Indications: Acute respiratory distress 518.82.  ------------------------------------------------------------ History: PMH: Myocardial infarction. Risk factors: Current tobacco use. Hypertension. Diabetes mellitus. Dyslipidemia.  ------------------------------------------------------------ Study Conclusions  - Left ventricle:  The cavity size was normal. Wall thickness was normal. Systolic function was mildly to moderately reduced. The estimated ejection fraction was in the range of 40% to 45%. There is hypokinesis of the mid-distal inferoposterior and apical myocardium. - Mitral valve: Mild regurgitation. - Left atrium: The atrium was mildly dilated. Transthoracic echocardiography. M-mode, complete 2D, spectral Doppler, and color Doppler. Height  Assessment/Plan:  Patient Active Hospital Problem List:  NSTEMI (non-ST elevated myocardial infarction) (12/19/2011) Presently stable rhythm and BP. Depressed LV systolic function by echo.  Plan: Continue present cardiac meds         LOS: 20 days    Cassell Clement 01/07/2012, 9:58 AM

## 2012-01-07 NOTE — Plan of Care (Signed)
Problem: Phase II Progression Outcomes Goal: Tolerating diet Outcome: Not Applicable Date Met:  01/07/12 On continuous Tube Feeding  Problem: Phase III Progression Outcomes Goal: Pain controlled on oral analgesia Outcome: Not Applicable Date Met:  01/07/12 IV prn pain medication in use and controls pt pain Goal: Ambulate BID Outcome: Not Met (add Reason) Pt to weak to walk, can pivot to chair with 1 person assist  Problem: Discharge Progression Outcomes Goal: Tolerates diet Outcome: Completed/Met Date Met:  01/07/12 tolerates TF Goal: Ambulates safely using assistive device Outcome: Not Applicable Date Met:  01/07/12 Pivot to chair with 1-2 person assist

## 2012-01-07 NOTE — Plan of Care (Signed)
Problem: Phase II Progression Outcomes Goal: Date pt extubated/weaned off vent Outcome: Completed/Met Date Met:  01/07/12 Trach collar in place

## 2012-01-07 NOTE — Progress Notes (Signed)
Name: Reginald Gutierrez MRN: [id number] DOB: 12-11-1965    LOS: 20  PCCM PROGRESS NOTE  Active Problems:  HYPOTHYROIDISM  DIABETES MELLITUS, TYPE I  Tibial fracture  NSTEMI (non-ST elevated myocardial infarction)  Acute blood loss anemia  Acute respiratory failure  Aspiration pneumonia  Protein calorie malnutrition  Acute renal failure  Encephalopathy acute  History of Present Illness: 46 y/o with history of DM and cocaine abuse admitted with bilateral tibia fracture s/p syncopal episode on 4/19.  Postoperatively he developed acute hypoxia and refractory shock, transient ST depression and troponin leak.  Course is complicated by ARDS requiring HFOV and acute renal failure requiring CVVHD.  Lines / Drains: 4/20 OETT >>>5/6 5/6 trach (df)>>> 4/20 R IJ CVL >>>plan 5/9 4/20 R fem A-line >>>5/2 4/23 L IJ HD>>>Changed 4/30>>5/2 (thrombosed) 5/2   Rt IJ Diatek cath (Dickson)>>  Cultures: 4/20  Blood >>>negative 4/20  Sputum >>>negative 4/23  Blood >>> negative 4/23  Urine >>>negative 4/29  Sputum>>>negative 4/29  Urine>>>negative 4/29  Blood>>>negative   Abx: Zosyn 4/20 (empirical, G-, anaerobes) >>>4/26 Vanc 4/20 (empirical, MRSA) >>>4/26 Vanc restart 4/29>>>off Levaquin 4/24 (empirical, atypical) >>>5/3 Ceftazidime Elita Quick) 4/29>>>5/7  Tests / Events: 4/19  Admission, repair of bilat tibia Fx 4/20  Refractory shock, hypoxemic resp failure, NSTEMI.  Intubated 4/20  CT head >>> NAD 4/20  TTE >>> EF 25 to 30%, akinesis of mid/distal anteroseptal and apical area 4/21  Chest CT angio >>> No central pulmonary embolism. Slight progression of right greater than left airspace disease. 4/23  Renal US >>> no hydronephrosis, no urine obstruction 4/23  HFOV due to refractory hypoxemia.  Vasopressors.  CVVHD. 4/24  Off HFOV 4/29  Transfused 1 U blood 5/3    Tranfuse 1 unit PRBC 5/6- trach (DF) 5/7- 23 hrs trach collar then desat, vemt rest 5/9- back to trach collar, off continuous  sedation  Subjective/interval: 5/9- back to trach collar, off continuous sedation  Vital Signs: Temp:  [98.1 F (36.7 C)-101 F (38.3 C)] 100.7 F (38.2 C) (05/09 1200) Pulse Rate:  [87-123] 92  (05/09 1300) Resp:  [16-33] 31  (05/09 1300) BP: (99-144)/(44-83) 110/49 mmHg (05/09 1300) SpO2:  [84 %-100 %] 99 % (05/09 1300) FiO2 (%):  [40 %] 40 % (05/09 1300) Weight:  [80.5 kg (177 lb 7.5 oz)-81.8 kg (180 lb 5.4 oz)] 80.5 kg (177 lb 7.5 oz) (05/08 1953) I/O last 3 completed shifts: In: 2464 [I.V.:789; NG/GT:1675] Out: 3375 [Other:3000; Stool:375]  Physical Examination: General less agitated, in chair HEENT - trach no bleeding Cardiac - S1 S2 Regular, no murmurs Chest - reduced bases Abd - Soft, nontender Ext - Bilateral pitting edema unchanged Neuro - rass 1  Ventilator settings: Vent Mode:  [-] PRVC FiO2 (%):  [40 %] 40 % Set Rate:  [14 bmp] 14 bmp Vt Set:  [520 mL] 520 mL PEEP:  [5 cmH20] 5 cmH20 Plateau Pressure:  [20 cmH20-25 cmH20] 22 cmH20  ASSESSMENT AND PLAN  NEUROLOGIC A:  Acute encephalopathy.  Requires high doses of Fentanyl / Versed for sedation. P: -->  Success with drips off -->  Methadone successful in dcing fent drip --> exg for qtc today --> continue rispirdal  PULMONARY  Lab 01/06/12 0453 01/05/12 1742 01/03/12 0435  PHART 7.386 7.348* 7.394  PCO2ART 40.3 47.8* 44.4  PO2ART 85.0 79.0* 85.0  HCO3 24.2* 26.3* 27.0*  O2SAT 96.0 95.0 96.0   A:  Acute respiratory failure secondary to PNA, ARDS, acute pulmonary edema, and possible fat embolism.  P: -->  Trach collar attempts again today, goal 24 hrs --> HD per renal for volume off -->pcxr in am assess atx contribution  CARDIOVASCULAR No results found for this basename: LATICACIDVEN:5, O2SATVEN:5 in the last 168 hours A:  NSTEMI.  Acute systolic CHF. P: -->  ASA, Coreg, Lipitor -->  No ACE due to renal failure -->  May need to reduce coreg in hopes to have better success volume off  hd  RENAL  Lab 01/06/12 0517 01/05/12 0500 01/04/12 0450 01/03/12 1100 01/03/12 0356 01/02/12 0442  NA 137 141 135 136 136 --  K 3.7 4.1 -- -- -- --  CL 100 103 99 100 99 --  CO2 25 29 26 28 25  --  GLUCOSE 296* 84 224* 228* 156* --  BUN 52* 32* 53* 30* 69* --  CREATININE 5.59* 4.27* 5.31* 3.70* 6.79* --  CALCIUM 8.9 8.6 8.9 8.3* 8.5 --  MG -- -- -- -- -- --  PHOS 4.5 4.1 -- 3.5 6.1* 4.5   A:  Acute renal failure from ATN with shock/hypoxia, IV contrast load, and DM. P: -->  HD per renal today, hd Will reduce ocreg  GASTROINTESTINAL  Lab 01/06/12 0517 01/05/12 0500 01/03/12 1100 01/03/12 0356 01/02/12 0442  AST -- 36 -- -- --  ALT -- 20 -- -- --  ALKPHOS -- 111 -- -- --  BILITOT -- 0.3 -- -- --  PROT -- 6.1 -- -- --  ALBUMIN 2.5* 2.4* 2.1* 2.2* 2.1*   A:  Malnutrition. P: -->  TF  --> ppi --> needs SLP, ordered, avoid peg if able  HEMATOLOGIC  Lab 01/06/12 0517 01/05/12 0500 01/04/12 1106 01/04/12 0450 01/03/12 0355 01/02/12 0441  HCT 24.9* 23.2* -- 23.8* 22.8* 24.1*  INR -- -- 1.21 -- -- --  APTT -- -- 30 -- -- --   A:  Anemia of critical illness and chronic disease. P: -->  Aranesp --> cbc in future  INFECTIOUS  Lab 01/06/12 0517 01/05/12 0500 01/04/12 0450 01/03/12 0355 01/02/12 0441  WBC 17.3* 16.6* 18.4* 15.5* 14.0*  PROCALCITON -- -- -- -- --   A:  HCAP. P: --> no further abx , s/p full course  ENDOCRINE  Lab 01/07/12 1221 01/07/12 0720 01/07/12 0331 01/06/12 2351 01/06/12 1935  GLUCAP 199* 329* 322* 210* 133*   A:  IDDM.  Hypothyroidism. P: -->  SSI / CBG -->  Synthroid Increase lantus  BEST PRACTICE / DISPOSITION -->  ICU status under PCCM -->  Full code re established 5/8 -->  TF restarted -->  Heparin [place] for DVT Px -->  Protonix IV for GI Px -->  Ventilator bundle  Likely to [facility] today If not, to sdu vent  Mcarthur Rossetti. Tyson Alias, MD, FACP Pgr: [phone] [facility]

## 2012-01-07 NOTE — Progress Notes (Signed)
Subjective: Interval History: none.  Objective: Vital signs in last 24 hours: Temp:  [98.1 F (36.7 C)-101.4 F (38.6 C)] 101 F (38.3 C) (05/09 0334) Pulse Rate:  [85-123] 107  (05/09 0813) Resp:  [16-27] 24  (05/09 0756) BP: (100-151)/(44-90) 119/52 mmHg (05/09 0813) SpO2:  [84 %-100 %] 97 % (05/09 0756) FiO2 (%):  [40 %] 40 % (05/09 0756) Weight:  [80.5 kg (177 lb 7.5 oz)-81.8 kg (180 lb 5.4 oz)] 80.5 kg (177 lb 7.5 oz) (05/08 1953) Weight change: 0 kg (0 lb)  Intake/Output from previous day: 05/08 0701 - 05/09 0700 In: 1452.5 [I.V.:407.5; NG/GT:1045] Out: 3175 [Stool:175] Intake/Output this shift:    General appearance: delirious and pale Resp: diminished breath sounds bilaterally, rales bibasilar and rhonchi bibasilar Cardio: S1, S2 normal and systolic murmur: holosystolic 2/6, blowing at apex GI: hernia, pos bs, mild distension Extremities: incisions clean  Lab Results:  Basename 01/06/12 0517 01/05/12 0500  WBC 17.3* 16.6*  HGB 7.6* 7.2*  HCT 24.9* 23.2*  PLT 693* 542*   BMET:  Basename 01/06/12 0517 01/05/12 0500  NA 137 141  K 3.7 4.1  CL 100 103  CO2 25 29  GLUCOSE 296* 84  BUN 52* 32*  CREATININE 5.59* 4.27*  CALCIUM 8.9 8.6   No results found for this basename: PTH:2 in the last 72 hours Iron Studies:  Basename 01/04/12 0851  IRON 31*  TIBC 241  TRANSFERRIN --  FERRITIN --    Studies/Results: No results found.  I have reviewed the patient's current medications.  Assessment/Plan: 1 AKI oliguric ATN.  For HD.  Vol ok. 2 DM fair control 3 Fevers primary acute worry, change lines, C&S. 4 Nutrition TF 5 Anemia on EPO 6 Tib fx P HD, epo, fe, C&S, Tf    LOS: 20 days   Reginald Gutierrez L 01/07/2012,8:36 AM

## 2012-01-07 NOTE — Plan of Care (Signed)
Problem: Phase II Progression Outcomes Goal: Time pt extubated/weaned off vent Outcome: Completed/Met Date Met:  01/07/12 01-07-12 0730

## 2012-01-08 ENCOUNTER — Other Ambulatory Visit (HOSPITAL_COMMUNITY): Payer: Medicare Other

## 2012-01-08 ENCOUNTER — Institutional Professional Consult (permissible substitution) (HOSPITAL_COMMUNITY): Payer: Medicare Other

## 2012-01-08 LAB — COMPREHENSIVE METABOLIC PANEL
ALT: 24 U/L (ref 0–53)
AST: 31 U/L (ref 0–37)
Albumin: 2.4 g/dL — ABNORMAL LOW (ref 3.5–5.2)
CO2: 31 mEq/L (ref 19–32)
Calcium: 8.8 mg/dL (ref 8.4–10.5)
Chloride: 100 mEq/L (ref 96–112)
Creatinine, Ser: 2.66 mg/dL — ABNORMAL HIGH (ref 0.50–1.35)
GFR calc non Af Amer: 27 mL/min — ABNORMAL LOW (ref >90)
Sodium: 138 mEq/L (ref 135–145)
Total Bilirubin: 0.4 mg/dL (ref 0.3–1.2)

## 2012-01-08 LAB — CBC
Platelets: 551 10*3/uL — ABNORMAL HIGH (ref 150–400)
RBC: 2.63 MIL/uL — ABNORMAL LOW (ref 4.22–5.81)
RDW: 16.9 % — ABNORMAL HIGH (ref 11.5–15.5)
WBC: 17.2 10*3/uL — ABNORMAL HIGH (ref 4.0–10.5)

## 2012-01-08 LAB — PHOSPHORUS: Phosphorus: 1.6 mg/dL — ABNORMAL LOW (ref 2.3–4.6)

## 2012-01-08 LAB — PROTIME-INR: Prothrombin Time: 15.5 seconds — ABNORMAL HIGH (ref 11.6–15.2)

## 2012-01-09 LAB — CBC
Hemoglobin: 7.9 g/dL — ABNORMAL LOW (ref 13.0–17.0)
MCH: 28.7 pg (ref 26.0–34.0)
Platelets: 558 10*3/uL — ABNORMAL HIGH (ref 150–400)
RBC: 2.75 MIL/uL — ABNORMAL LOW (ref 4.22–5.81)
WBC: 14.2 10*3/uL — ABNORMAL HIGH (ref 4.0–10.5)

## 2012-01-09 LAB — FERRITIN: Ferritin: 596 ng/mL — ABNORMAL HIGH (ref 22–322)

## 2012-01-09 LAB — IRON AND TIBC
Iron: 104 ug/dL (ref 42–135)
Saturation Ratios: 35 % (ref 20–55)
TIBC: 295 ug/dL (ref 215–435)
UIBC: 191 ug/dL (ref 125–400)

## 2012-01-09 LAB — BASIC METABOLIC PANEL
CO2: 28 mEq/L (ref 19–32)
Chloride: 99 mEq/L (ref 96–112)
Glucose, Bld: 260 mg/dL — ABNORMAL HIGH (ref 70–99)
Sodium: 138 mEq/L (ref 135–145)

## 2012-01-09 NOTE — Progress Notes (Signed)
Subjective:  Patient awake smiling following commands.  Mother present Objective: Vital signs in last 24 hours:    Intake/Output from previous day:   Intake/Output this shift:     Basename 01/09/12 0513 01/08/12 0450 01/07/12 1529  HGB 7.9* 7.4* 7.4*    Basename 01/09/12 0513 01/08/12 0450  WBC 14.2* 17.2*  RBC 2.75* 2.63*  HCT 26.3* 24.4*  PLT 558* 551*    Basename 01/09/12 0513 01/08/12 0450  NA 138 138  K 4.8 3.5  CL 99 100  CO2 28 31  BUN 45* 23  CREATININE 4.41* 2.66*  GLUCOSE 260* 141*  CALCIUM 9.1 8.8    Basename 01/08/12 0450  LABPT --  INR 1.20  wounds good  Staples removed  Strips applied Minimal swelling compts soft Intact pulses distally xrays reviewed and look satisfactory  Nothing on rt anterior shin of concern  Xray soft tissue densities not paplable    Probably related to fracture site    Will observe Assessment/Plan: Ortho stable continue NWB  PT to continue to mob joints and activate muscles  Answered questions Will recheck in few d ays Khoa Opdahl ANDREW 01/09/2012, 11:00 AM

## 2012-01-10 LAB — CBC
Hemoglobin: 8.1 g/dL — ABNORMAL LOW (ref 13.0–17.0)
MCH: 29.1 pg (ref 26.0–34.0)
MCHC: 30.5 g/dL (ref 30.0–36.0)
MCV: 95.7 fL (ref 78.0–100.0)
RBC: 2.78 MIL/uL — ABNORMAL LOW (ref 4.22–5.81)

## 2012-01-10 LAB — BASIC METABOLIC PANEL
CO2: 28 mEq/L (ref 19–32)
Calcium: 9 mg/dL (ref 8.4–10.5)
Creatinine, Ser: 4.61 mg/dL — ABNORMAL HIGH (ref 0.50–1.35)
Glucose, Bld: 379 mg/dL — ABNORMAL HIGH (ref 70–99)

## 2012-01-11 DIAGNOSIS — J690 Pneumonitis due to inhalation of food and vomit: Secondary | ICD-10-CM

## 2012-01-11 DIAGNOSIS — N179 Acute kidney failure, unspecified: Secondary | ICD-10-CM

## 2012-01-11 DIAGNOSIS — J9600 Acute respiratory failure, unspecified whether with hypoxia or hypercapnia: Secondary | ICD-10-CM

## 2012-01-11 DIAGNOSIS — D62 Acute posthemorrhagic anemia: Secondary | ICD-10-CM

## 2012-01-11 DIAGNOSIS — G9340 Encephalopathy, unspecified: Secondary | ICD-10-CM

## 2012-01-11 LAB — RENAL FUNCTION PANEL
CO2: 30 mEq/L (ref 19–32)
Chloride: 99 mEq/L (ref 96–112)
GFR calc Af Amer: 18 mL/min — ABNORMAL LOW (ref >90)
Glucose, Bld: 213 mg/dL — ABNORMAL HIGH (ref 70–99)
Potassium: 5.8 mEq/L — ABNORMAL HIGH (ref 3.5–5.1)
Sodium: 140 mEq/L (ref 135–145)

## 2012-01-11 LAB — CBC
HCT: 27.4 % — ABNORMAL LOW (ref 39.0–52.0)
Hemoglobin: 8.1 g/dL — ABNORMAL LOW (ref 13.0–17.0)
RBC: 2.88 MIL/uL — ABNORMAL LOW (ref 4.22–5.81)
RDW: 19.2 % — ABNORMAL HIGH (ref 11.5–15.5)
WBC: 11.3 10*3/uL — ABNORMAL HIGH (ref 4.0–10.5)

## 2012-01-11 LAB — IRON AND TIBC
Saturation Ratios: 15 % — ABNORMAL LOW (ref 20–55)
TIBC: 238 ug/dL (ref 215–435)

## 2012-01-11 LAB — HEPATITIS B SURFACE ANTIGEN: Hepatitis B Surface Ag: NEGATIVE

## 2012-01-11 LAB — FERRITIN: Ferritin: 568 ng/mL — ABNORMAL HIGH (ref 22–322)

## 2012-01-11 NOTE — Progress Notes (Signed)
Name: Reginald Gutierrez MRN: [id number] DOB: 22-Nov-1965    LOS: 4  PCCM PROGRESS NOTE  Active Problems:  * No active hospital problems. *   History of Present Illness: 46 y/o with history of DM and cocaine abuse admitted with bilateral tibia fracture s/p syncopal episode on 4/19.  Postoperatively he developed acute hypoxia and refractory shock, transient ST depression and troponin leak.  Course is complicated by ARDS requiring HFOV and acute renal failure requiring CVVHD.  Well known to my service. Hospital course at [facility] complicated by likely fat emboli after bilat tib/fx, ARDS, MI, dx CHF, trach , renal failure. [facility] , no HD required over weekend. Trach collar done 12 hrs day, nocturnal vent used.  No Known Allergies  Past Medical History  Diagnosis Date  . Diabetes mellitus type 1   . Hyperlipidemia   . Hypothyroidism   . Depression   . Umbilical hernia   . Hypertension    Past Surgical History  Procedure Date  . Appendectomy     open  . Tibia im nail insertion 12/18/2011    Procedure: INTRAMEDULLARY (IM) NAIL TIBIAL;  Surgeon: Eugenia Mcalpine, MD;  Location: [facility] OR[facility];  Service: Orthopedics;  Laterality: Bilateral;  . Insertion of dialysis catheter 12/31/2011    Procedure: INSERTION OF DIALYSIS CATHETER;  Surgeon: Chuck Hint, MD;  Location: [facility] OR;  Service: Vascular;  Laterality: N/A;    No current facility-administered medications on file prior to encounter.   Current Outpatient Prescriptions on File Prior to Encounter  Medication Sig Dispense Refill  . acetaminophen (TYLENOL) 160 MG/5ML solution Place 20.3 mLs (650 mg total) into feeding tube every 4 (four) hours as needed for fever.  120 mL    . antiseptic oral rinse (BIOTENE) LIQD 15 mLs by Mouth Rinse route QID.      Marland Kitchen aspirin 81 MG chewable tablet Place 1 tablet (81 mg total) into feeding tube daily.      Marland Kitchen atorvastatin (LIPITOR) 10 MG tablet Take 1 tablet (10 mg total) by mouth daily at 6 PM.       . carvedilol (COREG) 3.125 MG tablet Take 1 tablet (3.125 mg total) by mouth daily with breakfast.      . chlorhexidine (PERIDEX) 0.12 % solution Use as directed 15 mLs in the mouth or throat 2 (two) times daily.  120 mL    . darbepoetin (ARANESP) 200 MCG/0.4ML SOLN Inject 0.4 mLs (200 mcg total) into the skin every Wednesday with hemodialysis.  1.68 mL    . feeding supplement (PRO-STAT SUGAR FREE 64) LIQD Place 30 mLs into feeding tube every 6 (six) hours.  900 mL    . heparin 1000 unit/mL SOLN injection 1 mL (1,000 Units total) by Dialysis route as needed (in dialysis).      . heparin 5000 UNIT/ML injection Inject 1 mL (5,000 Units total) into the skin every 8 (eight) hours.  1 mL    . insulin aspart (NOVOLOG) 100 UNIT/ML injection Inject 0-15 Units into the skin every 4 (four) hours.  1 vial    . insulin glargine (LANTUS) 100 UNIT/ML injection Inject 38 Units into the skin daily.  10 mL    . levothyroxine (SYNTHROID, LEVOTHROID) 50 MCG tablet Take 1 tablet (50 mcg total) by mouth daily before breakfast.      . lidocaine (XYLOCAINE) 1 % SOLN injection Inject 5 mLs into the skin as needed (topical anesthesia for hemodialysis ifGEBAUERS is ineffective.).      Marland Kitchen  lidocaine-prilocaine (EMLA) cream Apply 1 application topically as needed (topical anesthesia for hemodialysis if Gebauers and Lidocaine injection are ineffective.).  30 g    . LORazepam (ATIVAN) 2 MG tablet Take 1 tablet (2 mg total) by mouth every 8 (eight) hours.  30 tablet    . methadone (DOLOPHINE) 5 MG tablet Take 1 tablet (5 mg total) by mouth every 8 (eight) hours.  30 tablet    . multivitamin (RENA-VIT) TABS tablet Take 1 tablet by mouth daily.      . Nutritional Supplements (FEEDING SUPPLEMENT, NEPRO CARB STEADY,) LIQD Take 237 mLs by mouth as needed (missed meal during dialysis.).      Marland Kitchen Nutritional Supplements (FEEDING SUPPLEMENT, OSMOLITE 1.5 CAL,) LIQD Place 1,000 mLs into feeding tube continuous.      Marland Kitchen nystatin  (MYCOSTATIN/NYSTOP) 100000 UNIT/GM POWD 1 application BID      . pantoprazole sodium (PROTONIX) 40 mg/20 mL PACK Place 20 mLs (40 mg total) into feeding tube daily at 12 noon.  30 each    . pentafluoroprop-tetrafluoroeth (GEBAUERS) AERO Apply 1 application topically as needed (topical anesthesia for hemodialysis).      . risperiDONE (RISPERDAL) 1 MG/ML oral solution Take 0.5 mLs (0.5 mg total) by mouth 2 (two) times daily.  30 mL     History   Social History  . Marital Status: Single    Spouse Name: N/A    Number of Children: N/A  . Years of Education: N/A   Occupational History  . Not on file.   Social History Main Topics  . Smoking status: Current Everyday Smoker -- 0.5 packs/day    Types: Cigarettes  . Smokeless tobacco: Never Used  . Alcohol Use: No  . Drug Use: No  . Sexually Active: No   Other Topics Concern  . Not on file   Social History Narrative  . No narrative on file   No family history on file. ROS- no fevers, no headaches, no nasal dc, no CP,   Lines / Drains: 4/20 OETT >>>5/6 5/6 trach (df)>>> 4/20 R IJ CVL >>>plan 5/9 4/20 R fem A-line >>>5/2 4/23 L IJ HD>>>Changed 4/30>>5/2 (thrombosed) 5/2   Rt IJ Diatek cath ([facility])>>  Cultures: 4/20  Blood >>>negative 4/20  Sputum >>>negative 4/23  Blood >>> negative 4/23  Urine >>>negative 4/29  Sputum>>>negative 4/29  Urine>>>negative 4/29  Blood>>>negative   Abx: Zosyn 4/20 (empirical, G-, anaerobes) >>>4/26 Vanc 4/20 (empirical, MRSA) >>>4/26 Vanc restart 4/29>>>off Levaquin 4/24 (empirical, atypical) >>>5/3 Ceftazidime Elita Quick) 4/29>>>5/7  Tests / Events: 4/19  Admission, repair of bilat tibia Fx 4/20  Refractory shock, hypoxemic resp failure, NSTEMI.  Intubated 4/20  CT head >>> NAD 4/20  TTE >>> EF 25 to 30%, akinesis of mid/distal anteroseptal and apical area 4/21  Chest CT angio >>> No central pulmonary embolism. Slight progression of right greater than left airspace disease. 4/23  Renal US  >>> no hydronephrosis, no urine obstruction 4/23  HFOV due to refractory hypoxemia.  Vasopressors.  CVVHD. 4/24  Off HFOV 4/29  Transfused 1 U blood 5/3    Tranfuse 1 unit PRBC 5/6- trach (DF) 5/7- 23 hrs trach collar then desat, vemt rest 5/9- back to trach collar, off continuous sedation  Subjective/interval: 5/12 - 12 hours daily trach collar, no need hd overweekend  Vital Signs: Vitals reviewed in full    Physical Examination: General : amazing well appearing, awake, communicating well HEENT - trach wnl Cardiac - S1 S2 Regular, no murmurs Chest - reduced bases,  improved overall Abd - Soft, nontender Ext - Bilateral pitting edema unchanged Neuro - rass 0  Ventilator settings:    ASSESSMENT AND PLAN   PULMONARY  Lab 01/06/12 0453 01/05/12 1742  PHART 7.386 7.348*  PCO2ART 40.3 47.8*  PO2ART 85.0 79.0*  HCO3 24.2* 26.3*  O2SAT 96.0 95.0   A:  Acute respiratory failure secondary to PNA, ARDS, acute pulmonary edema, and possible fat embolism. P: -->  Trach collar attempts again successful at 12 hrs, HD today then goal 24 hrs trach collar, would like to dc vent from room --> would like to see pcxr, assess volume status --> HD per renal for volume off today likely -->would like to use PMV today, ciff down --> try to avoid peg, swallow eval  Mcarthur Rossetti. Tyson Alias, MD, FACP Pgr: [phone] [place] [facility]

## 2012-01-12 ENCOUNTER — Other Ambulatory Visit (HOSPITAL_COMMUNITY): Payer: Medicare Other

## 2012-01-12 NOTE — Progress Notes (Signed)
Name: Reginald Gutierrez MRN: [id number] DOB: Sep 01, 1965    LOS: 5  PCCM PROGRESS NOTE  History of Present Illness: 46 y/o with history of DM and cocaine abuse admitted with bilateral tibia fracture s/p syncopal episode on 4/19.  Postoperatively he developed acute hypoxia and refractory shock, transient ST depression and troponin leak.  Course is complicated by ARDS requiring HFOV and acute renal failure requiring CVVHD.  Well known to my service. Hospital course at [facility] complicated by likely fat emboli after bilat tib/fx, ARDS, MI, dx CHF, trach , renal failure. Transferred to [facility] 5/10 , no HD required over weekend. Trach collar done 12 hrs day, nocturnal vent used.  Lines / Drains: 4/20 OETT >>>5/6 5/6 trach (df)>>> 4/20 R IJ CVL >>>plan 5/9 4/20 R fem A-line >>>5/2 4/23 L IJ HD>>>Changed 4/30>>5/2 (thrombosed) 5/2   Rt IJ Diatek cath ([facility]ickson)>>  Cultures: 4/20  Blood >>>negative 4/20  Sputum >>>negative 4/23  Blood >>> negative 4/23  Urine >>>negative 4/29  Sputum>>>negative 4/29  Urine>>>negative 4/29  Blood>>>negative   Abx: Zosyn 4/20 (empirical, G-, anaerobes) >>>4/26 Vanc 4/20 (empirical, MRSA) >>>4/26 Vanc restart 4/29>>>off Levaquin 4/24 (empirical, atypical) >>>5/3 Ceftazidime Elita Quick) 4/29>>>5/7  Tests / Events: 4/19  Admission, repair of bilat tibia Fx 4/20  Refractory shock, hypoxemic resp failure, NSTEMI.  Intubated 4/20  CT head >>> NAD 4/20  TTE >>> EF 25 to 30%, akinesis of mid/distal anteroseptal and apical area 4/21  Chest CT angio >>> No central pulmonary embolism. Slight progression of right greater than left airspace disease. 4/23  Renal US >>> no hydronephrosis, no urine obstruction 4/23  HFOV due to refractory hypoxemia.  Vasopressors.  CVVHD. 4/24  Off HFOV 4/29  Transfused 1 U blood 5/3    Tranfuse 1 unit PRBC 5/6- trach (DF) 5/7- 23 hrs trach collar then desat, vemt rest 5/9- back to trach collar, off continuous sedation 5/14 on 28% ATC,  increased secretions  Subjective/interval: 5/12 - Tolerating ATC  Vital Signs: Vitals reviewed in full    Physical Examination: General : awake, communicating well HEENT - trach wnl Cardiac - S1 S2 Regular, no murmurs Chest - reduced bases, improved overall Abd - Soft, nontender Ext - Bilateral pitting edema unchanged Neuro - rass 0   ASSESSMENT AND PLAN   PULMONARY  Lab 01/06/12 0453 01/05/12 1742  PHART 7.386 7.348*  PCO2ART 40.3 47.8*  PO2ART 85.0 79.0*  HCO3 24.2* 26.3*  O2SAT 96.0 95.0   A:  Acute respiratory failure secondary to PNA, ARDS, acute pulmonary edema, and possible fat embolism. P: --> Trach collar successful x 24 hrs --> pcxr c/w edema, consider further volume off in setting EF 30% --> HD per renal  -->would like to use PMV but secretions may be limiting and cuff 6 an issue for him, need to use 6 cuffless when able -->consider trach change to cuffless but secretions concerning for now --> try to avoid peg, swallow eval Dc sutures   [name], NP-C [place] [facility] Pgr: [phone]     I have fully examined this patient and agree with above findings.    And edited in full  Mcarthur Rossetti. Tyson Alias, MD, FACP Pgr: [phone] [place] [facility]

## 2012-01-13 ENCOUNTER — Institutional Professional Consult (permissible substitution) (HOSPITAL_COMMUNITY): Payer: Medicare Other

## 2012-01-13 LAB — BASIC METABOLIC PANEL
CO2: 34 mEq/L — ABNORMAL HIGH (ref 19–32)
Calcium: 9.3 mg/dL (ref 8.4–10.5)
Glucose, Bld: 35 mg/dL — CL (ref 70–99)
Sodium: 145 mEq/L (ref 135–145)

## 2012-01-13 LAB — HEMOGLOBIN AND HEMATOCRIT, BLOOD: HCT: 31.4 % — ABNORMAL LOW (ref 39.0–52.0)

## 2012-01-14 LAB — BASIC METABOLIC PANEL
Chloride: 103 mEq/L (ref 96–112)
GFR calc Af Amer: 34 mL/min — ABNORMAL LOW (ref >90)
GFR calc non Af Amer: 29 mL/min — ABNORMAL LOW (ref >90)
Potassium: 7.2 mEq/L (ref 3.5–5.1)
Sodium: 144 mEq/L (ref 135–145)

## 2012-01-14 LAB — POTASSIUM: Potassium: 6.3 mEq/L (ref 3.5–5.1)

## 2012-01-15 ENCOUNTER — Institutional Professional Consult (permissible substitution) (HOSPITAL_COMMUNITY): Payer: Medicare Other

## 2012-01-15 LAB — BASIC METABOLIC PANEL
CO2: 31 mEq/L (ref 19–32)
CO2: 30 mEq/L (ref 19–32)
Calcium: 9.8 mg/dL (ref 8.4–10.5)
Chloride: 109 mEq/L (ref 96–112)
Chloride: 111 mEq/L (ref 96–112)
GFR calc Af Amer: 37 mL/min — ABNORMAL LOW (ref >90)
GFR calc Af Amer: 38 mL/min — ABNORMAL LOW (ref >90)
Sodium: 153 mEq/L — ABNORMAL HIGH (ref 135–145)
Sodium: 151 mEq/L — ABNORMAL HIGH (ref 135–145)

## 2012-01-15 NOTE — Procedures (Signed)
Trach change first  6 cuffed removed lubed 6 cuffles placed No resistance etc No blood loss sats wnl, cap pos, equal BS,  Tolerated well  Mcarthur Rossetti. Tyson Alias, MD, FACP Pgr: [phone] [place] [facility]

## 2012-01-15 NOTE — Progress Notes (Signed)
Name: Reginald Gutierrez MRN: [id number] DOB: 1965-12-19    LOS: 8  PCCM PROGRESS NOTE  History of Present Illness: 46 y/o with history of DM and cocaine abuse admitted with bilateral tibia fracture s/p syncopal episode on 4/19.  Postoperatively he developed acute hypoxia and refractory shock, transient ST depression and troponin leak.  Course is complicated by ARDS requiring HFOV and acute renal failure requiring CVVHD.  Well known to my service. Hospital course at [facility] complicated by likely fat emboli after bilat tib/fx, ARDS, MI, dx CHF, trach , renal failure. Transferred to [facility] 5/10 , no HD required over weekend. Trach collar done 12 hrs day, nocturnal vent used.  Lines / Drains: 4/20 OETT >>>5/6 5/6 trach (df)>>> 4/20 R IJ CVL >>>plan 5/9 4/20 R fem A-line >>>5/2 4/23 L IJ HD>>>Changed 4/30>>5/2 (thrombosed) 5/2   Rt IJ Diatek cath ([facility])>>  Cultures: 4/20  Blood >>>negative 4/20  Sputum >>>negative 4/23  Blood >>> negative 4/23  Urine >>>negative 4/29  Sputum>>>negative 4/29  Urine>>>negative 4/29  Blood>>>negative   Abx: Zosyn 4/20 (empirical, G-, anaerobes) >>>4/26 Vanc 4/20 (empirical, MRSA) >>>4/26 Vanc restart 4/29>>>off Levaquin 4/24 (empirical, atypical) >>>5/3 Ceftazidime Elita Quick) 4/29>>>5/7  Tests / Events: 4/19  Admission, repair of bilat tibia Fx 4/20  Refractory shock, hypoxemic resp failure, NSTEMI.  Intubated 4/20  CT head >>> NAD 4/20  TTE >>> EF 25 to 30%, akinesis of mid/distal anteroseptal and apical area 4/21  Chest CT angio >>> No central pulmonary embolism. Slight progression of right greater than left airspace disease. 4/23  Renal US >>> no hydronephrosis, no urine obstruction 4/23  HFOV due to refractory hypoxemia.  Vasopressors.  CVVHD. 4/24  Off HFOV 4/29  Transfused 1 U blood 5/3    Tranfuse 1 unit PRBC 5/6- trach (DF) 5/7- 23 hrs trach collar then desat, vemt rest 5/9- back to trach collar, off continuous sedation 5/14 on 28% ATC,  increased secretions  Subjective/interval: No distress. Awake, following commands. Tries to communicate.   Vital Signs: Vitals reviewed in full, t 99.4 hr 103, 145/74 rr 27 sats 100% on 28%    Physical Examination: General : awake, communicating well HEENT - trach wnl Cardiac - S1 S2 Regular, no murmurs Chest - reduced bases, improved overall Abd - Soft, nontender Ext - Bilateral pitting edema unchanged Neuro - rass 0   Lab 01/15/12 0446 01/14/12 2025 01/14/12 1539 01/13/12 0400  NA 153* -- 144 145  K 5.2* 6.3* 7.2* --  CL 109 -- 103 102  CO2 31 -- 33* 34*  BUN 46* -- 47* 43*  CREATININE 2.35* -- 2.51* 2.42*  GLUCOSE 47* -- 134* 35*    Lab 01/13/12 0400 01/11/12 0418 01/10/12 0430 01/09/12 0513  HGB 9.3* 8.1* 8.1* --  HCT 31.4* 27.4* 26.6* --  WBC -- 11.3* 11.9* 14.2*  PLT -- 537* 559* 558*     ASSESSMENT AND PLAN   PULMONARY No results found for this basename: PHART:5,PCO2:5,PCO2ART:5,PO2ART:5,HCO3:5,O2SAT:5 in the last 168 hours A:  Acute respiratory failure secondary to PNA, ARDS, acute pulmonary edema, and possible fat embolism. Looks great from [employer].  P: --> will down size trach and convert to cuffless working towards eventual decannulation -was successful with PMV , no need to 4 --> HD per renal, looking like his renals are making a comeback.  --> keep even fluid status in setting of CM (ef 30%) --> secretions are better also  Mcarthur Rossetti. Tyson Alias, MD, FACP Pgr: [phone] [place] [facility]

## 2012-01-16 LAB — BASIC METABOLIC PANEL
Calcium: 9.2 mg/dL (ref 8.4–10.5)
GFR calc non Af Amer: 36 mL/min — ABNORMAL LOW (ref >90)
Glucose, Bld: 379 mg/dL — ABNORMAL HIGH (ref 70–99)
Potassium: 5 mEq/L (ref 3.5–5.1)
Sodium: 148 mEq/L — ABNORMAL HIGH (ref 135–145)

## 2012-01-17 LAB — BASIC METABOLIC PANEL
BUN: 36 mg/dL — ABNORMAL HIGH (ref 6–23)
Chloride: 109 mEq/L (ref 96–112)
GFR calc Af Amer: 50 mL/min — ABNORMAL LOW (ref >90)
GFR calc non Af Amer: 43 mL/min — ABNORMAL LOW (ref >90)
Potassium: 5.7 mEq/L — ABNORMAL HIGH (ref 3.5–5.1)
Sodium: 145 mEq/L (ref 135–145)

## 2012-01-18 ENCOUNTER — Institutional Professional Consult (permissible substitution) (HOSPITAL_COMMUNITY): Payer: Medicare Other

## 2012-01-18 LAB — CBC
MCH: 28 pg (ref 26.0–34.0)
MCHC: 28.3 g/dL — ABNORMAL LOW (ref 30.0–36.0)
MCV: 98.8 fL (ref 78.0–100.0)
Platelets: 507 10*3/uL — ABNORMAL HIGH (ref 150–400)
RDW: 18.4 % — ABNORMAL HIGH (ref 11.5–15.5)
WBC: 7.8 10*3/uL (ref 4.0–10.5)

## 2012-01-18 LAB — BASIC METABOLIC PANEL
BUN: 34 mg/dL — ABNORMAL HIGH (ref 6–23)
Calcium: 9.6 mg/dL (ref 8.4–10.5)
Chloride: 111 mEq/L (ref 96–112)
Creatinine, Ser: 1.74 mg/dL — ABNORMAL HIGH (ref 0.50–1.35)
GFR calc Af Amer: 53 mL/min — ABNORMAL LOW (ref >90)
GFR calc non Af Amer: 46 mL/min — ABNORMAL LOW (ref >90)

## 2012-01-18 NOTE — Progress Notes (Signed)
Name: Reginald Gutierrez MRN: [id number] DOB: 1965/10/31    LOS: 11  PCCM PROGRESS NOTE  History of Present Illness: 46 y/o with history of DM and cocaine abuse admitted with bilateral tibia fracture s/p syncopal episode on 4/19.  Postoperatively he developed acute hypoxia and refractory shock, transient ST depression and troponin leak.  Course is complicated by ARDS requiring HFOV and acute renal failure requiring CVVHD.  Well known to my service. Hospital course at [facility] complicated by likely fat emboli after bilat tib/fx, ARDS, MI, dx CHF, trach , renal failure. Transferred to [facility] 5/10 , no HD required over weekend. Trach collar done 12 hrs day, nocturnal vent used.  No distress. Awake, following commands. Tries to communicate.   Vital Signs: Vitals reviewed, sats 98% on 28%    Physical Examination: General : awake, communicating well HEENT - trach #6 on PMV Cardiac - S1 S2 Regular, no murmurs Chest - scattered rhonchi Abd - Soft, nontender Ext - Bilateral pitting edema unchanged Neuro - rass 0   Lab 01/18/12 0405 01/17/12 1957 01/17/12 0656 01/17/12 0615 01/16/12 0430  NA 148* -- -- 145 148*  K 5.2* 4.7 -- 5.7* --  CL 111 -- -- 109 109  CO2 28 -- -- 26 31  BUN 34* -- -- 36* 39*  CREATININE 1.74* -- -- 1.83* 2.11*  GLUCOSE 324* -- 470* 530* --    Lab 01/18/12 0405 01/13/12 0400  HGB 9.1* 9.3*  HCT 32.1* 31.4*  WBC 7.8 --  PLT 507* --     ASSESSMENT AND PLAN   PULMONARY No results found for this basename: PHART:5,PCO2:5,PCO2ART:5,PO2ART:5,HCO3:5,O2SAT:5 in the last 168 hours A:  Acute respiratory failure secondary to PNA, ARDS, acute pulmonary edema, and possible fat embolism. Looks great from [employer]. Down sized to #6 on 5/17.  Would like to see him stronger before we consider decannulation.  P: --> HD per renal, looking like his renals are making a comeback.  --> keep even fluid status in setting of CM (ef 30%) --> we will see weekly and PRN  Anders Simmonds ACNP-BC [facility] Pager # [phone] OR # [phone] if no answer  Patient examined.  Records reviewed.  Case discussed with ACNP.  Assessment and plan as above.  Orlean Bradford, M.D., F.C.C.P. Pulmonary and Critical Care Medicine [facility] Cell: [phone] Pager: [phone]

## 2012-01-19 LAB — BASIC METABOLIC PANEL
Calcium: 9.3 mg/dL (ref 8.4–10.5)
Creatinine, Ser: 1.7 mg/dL — ABNORMAL HIGH (ref 0.50–1.35)
GFR calc non Af Amer: 47 mL/min — ABNORMAL LOW (ref >90)
Sodium: 151 mEq/L — ABNORMAL HIGH (ref 135–145)

## 2012-01-20 LAB — CBC
Platelets: 474 10*3/uL — ABNORMAL HIGH (ref 150–400)
RBC: 3.4 MIL/uL — ABNORMAL LOW (ref 4.22–5.81)
RDW: 17.5 % — ABNORMAL HIGH (ref 11.5–15.5)
WBC: 8 10*3/uL (ref 4.0–10.5)

## 2012-01-20 LAB — BASIC METABOLIC PANEL
CO2: 28 mEq/L (ref 19–32)
Calcium: 9.7 mg/dL (ref 8.4–10.5)
Chloride: 108 mEq/L (ref 96–112)
Creatinine, Ser: 1.78 mg/dL — ABNORMAL HIGH (ref 0.50–1.35)
GFR calc Af Amer: 51 mL/min — ABNORMAL LOW (ref >90)
Sodium: 144 mEq/L (ref 135–145)

## 2012-01-21 LAB — BASIC METABOLIC PANEL
CO2: 26 mEq/L (ref 19–32)
Calcium: 9.7 mg/dL (ref 8.4–10.5)
GFR calc non Af Amer: 44 mL/min — ABNORMAL LOW (ref >90)
Glucose, Bld: 162 mg/dL — ABNORMAL HIGH (ref 70–99)
Potassium: 4.9 mEq/L (ref 3.5–5.1)
Sodium: 143 mEq/L (ref 135–145)

## 2012-01-22 ENCOUNTER — Institutional Professional Consult (permissible substitution) (HOSPITAL_COMMUNITY): Payer: Medicare Other

## 2012-01-22 NOTE — Progress Notes (Signed)
Name: Reginald Gutierrez MRN: [id number] DOB: 12/17/1965    LOS: 15  PCCM PROGRESS NOTE  History of Present Illness: 46 y/o with history of DM and cocaine abuse admitted with bilateral tibia fracture s/p syncopal episode on 4/19.  Postoperatively he developed acute hypoxia and refractory shock, transient ST depression and troponin leak.  Course is complicated by ARDS requiring HFOV and acute renal failure requiring CVVHD.  Well known to my service. Hospital course at [facility] complicated by likely fat emboli after bilat tib/fx, ARDS, MI, dx CHF, trach , renal failure. Transferred to [facility] 5/10 , no HD required over weekend. Trach collar done 12 hrs day, nocturnal vent used.  No distress. Awake, following commands. Tries to communicate.   Vital Signs: Vitals reviewed, sats 98% on 28%    Physical Examination: General : awake, communicating well HEENT - trach #6 on PMV Cardiac - S1 S2 Regular, no murmurs Chest - scattered rhonchi Abd - Soft, nontender Ext - Bilateral pitting edema unchanged Neuro - rass 0   Lab 01/21/12 0710 01/20/12 0610 01/19/12 0358  NA 143 144 151*  K 4.9 5.0 4.7  CL 105 108 113*  CO2 26 28 28   BUN 34* 30* 31*  CREATININE 1.81* 1.78* 1.70*  GLUCOSE 162* 163* 134*    Lab 01/20/12 0610 01/18/12 0405  HGB 9.4* 9.1*  HCT 32.6* 32.1*  WBC 8.0 7.8  PLT 474* 507*     ASSESSMENT AND PLAN   PULMONARY No results found for this basename: PHART:5,PCO2:5,PCO2ART:5,PO2ART:5,HCO3:5,O2SAT:5 in the last 168 hours A:  Acute respiratory failure secondary to PNA, ARDS, acute pulmonary edema, and possible fat embolism. Looks great from [employer]. Down sized to #6 on 5/17. Seems like he is getting stronger. His biggest issue seems to be residual encephalopathy. He is very impulsive and at risk for self decannulation. This being said he looks good from a pulmonary standpoint.  P:  --> keep even fluid status in setting of CM (ef 30%) --> will try capping trials and work  towards decannulation. Would prefer to do this in a controlled manor instead of him doing it himself.   PB  Patient examined.  Records reviewed.  Case discussed with NP.  Assessment and plan as above.  Orlean Bradford, M.D., F.C.C.P. Pulmonary and Critical Care Medicine [facility] Cell: [phone] Pager: [phone]

## 2012-01-23 LAB — BASIC METABOLIC PANEL
CO2: 25 mEq/L (ref 19–32)
Calcium: 9.4 mg/dL (ref 8.4–10.5)
Chloride: 100 mEq/L (ref 96–112)
Glucose, Bld: 285 mg/dL — ABNORMAL HIGH (ref 70–99)
Sodium: 138 mEq/L (ref 135–145)

## 2012-01-23 LAB — CBC
Hemoglobin: 8.9 g/dL — ABNORMAL LOW (ref 13.0–17.0)
MCH: 28.1 pg (ref 26.0–34.0)
Platelets: 400 10*3/uL (ref 150–400)
RBC: 3.17 MIL/uL — ABNORMAL LOW (ref 4.22–5.81)
WBC: 7 10*3/uL (ref 4.0–10.5)

## 2012-01-23 LAB — GLUCOSE, RANDOM: Glucose, Bld: 499 mg/dL — ABNORMAL HIGH (ref 70–99)

## 2012-01-24 LAB — BASIC METABOLIC PANEL
CO2: 26 mEq/L (ref 19–32)
Chloride: 102 mEq/L (ref 96–112)
Glucose, Bld: 363 mg/dL — ABNORMAL HIGH (ref 70–99)
Potassium: 4.7 mEq/L (ref 3.5–5.1)
Sodium: 138 mEq/L (ref 135–145)

## 2012-01-24 LAB — CBC
Hemoglobin: 9.2 g/dL — ABNORMAL LOW (ref 13.0–17.0)
MCH: 28.5 pg (ref 26.0–34.0)
RBC: 3.23 MIL/uL — ABNORMAL LOW (ref 4.22–5.81)
WBC: 5.2 10*3/uL (ref 4.0–10.5)

## 2012-01-26 ENCOUNTER — Other Ambulatory Visit (HOSPITAL_COMMUNITY): Payer: Medicare Other

## 2012-01-26 ENCOUNTER — Encounter (INDEPENDENT_AMBULATORY_CARE_PROVIDER_SITE_OTHER): Payer: Self-pay | Admitting: General Surgery

## 2012-01-26 DIAGNOSIS — G4733 Obstructive sleep apnea (adult) (pediatric): Secondary | ICD-10-CM

## 2012-01-26 DIAGNOSIS — J9620 Acute and chronic respiratory failure, unspecified whether with hypoxia or hypercapnia: Secondary | ICD-10-CM

## 2012-01-26 DIAGNOSIS — Z930 Tracheostomy status: Secondary | ICD-10-CM

## 2012-01-26 DIAGNOSIS — R0902 Hypoxemia: Secondary | ICD-10-CM

## 2012-01-26 LAB — CBC
HCT: 31.4 % — ABNORMAL LOW (ref 39.0–52.0)
Hemoglobin: 9.5 g/dL — ABNORMAL LOW (ref 13.0–17.0)
MCH: 28 pg (ref 26.0–34.0)
MCV: 92.6 fL (ref 78.0–100.0)
RBC: 3.39 MIL/uL — ABNORMAL LOW (ref 4.22–5.81)
WBC: 7.7 10*3/uL (ref 4.0–10.5)

## 2012-01-26 LAB — BASIC METABOLIC PANEL
CO2: 25 mEq/L (ref 19–32)
Calcium: 9.4 mg/dL (ref 8.4–10.5)
Chloride: 105 mEq/L (ref 96–112)
Creatinine, Ser: 1.18 mg/dL (ref 0.50–1.35)
Glucose, Bld: 162 mg/dL — ABNORMAL HIGH (ref 70–99)

## 2012-01-26 NOTE — Progress Notes (Signed)
Name: Reginald Gutierrez MRN: [id number] DOB: 13-May-1966    LOS: 19  PCCM PROGRESS NOTE  History of Present Illness: 46 y/o with history of DM and cocaine abuse admitted with bilateral tibia fracture s/p syncopal episode on 4/19.  Postoperatively he developed acute hypoxia and refractory shock, transient ST depression and troponin leak.  Course is complicated by ARDS requiring HFOV and acute renal failure requiring CVVHD.    Vital Signs: Vital signs reviewed. Abnormal values will appear under impression plan section.      Physical Examination: General : awake, communicating well HEENT - trach #6 on PMV Cardiac - S1 S2 Regular, no murmurs Chest - scattered rhonchi Abd - Soft, nontender Ext - Bilateral pitting edema unchanged Neuro - rass 0   Lab 01/26/12 0630 01/24/12 0627 01/23/12 1215 01/23/12 0825  NA 142 138 -- 138  K 3.7 4.7 -- 4.9  CL 105 102 -- 100  CO2 25 26 -- 25  BUN 18 28* -- 33*  CREATININE 1.18 1.37* -- 1.51*  GLUCOSE 162* 363* 499* --    Lab 01/26/12 0630 01/24/12 0627 01/23/12 0825  HGB 9.5* 9.2* 8.9*  HCT 31.4* 30.1* 29.4*  WBC 7.7 5.2 7.0  PLT 353 448* 400     ASSESSMENT AND PLAN   PULMONARY No results found for this basename: PHART:5,PCO2:5,PCO2ART:5,PO2ART:5,HCO3:5,O2SAT:5 in the last 168 hours A:  Acute respiratory failure secondary to PNA, ARDS, acute pulmonary edema, and possible fat embolism. Looks great from [employer]. Down sized to #6 on 5/17. Seems like he is getting stronger. His biggest issue seems to be residual encephalopathy. He is very impulsive and at risk for self decannulation. This being said he looks good from a pulmonary standpoint.  P:  --> keep even fluid status in setting of CM (ef 30%) --> will try capping trials and work towards decannulation. Would prefer to do this in a controlled mannerr instead of him doing it himself.  Brett Canales Minor ACNP [facility] PCCM Pager [phone] till 3 pm If no answer page [phone] 01/26/2012,  10:04 AM  Would recommend continuing fluid as even as possible, today if first day to cap trach, if able to cap overnight night without desaturation then can consider decannulation.  He is connected to continuous O2 sat monitor, if not desaturation overnight tonight then can decannulate in AM.  Patient seen and examined, agree with above note.  I dictated the care and orders written for this patient under my direction.  Koren Bound, M.D. [phone]

## 2012-01-27 ENCOUNTER — Encounter (INDEPENDENT_AMBULATORY_CARE_PROVIDER_SITE_OTHER): Payer: Self-pay | Admitting: General Surgery

## 2012-01-30 ENCOUNTER — Other Ambulatory Visit (HOSPITAL_COMMUNITY): Payer: Medicare Other

## 2012-01-30 LAB — BASIC METABOLIC PANEL
BUN: 12 mg/dL (ref 6–23)
Creatinine, Ser: 1 mg/dL (ref 0.50–1.35)
GFR calc Af Amer: >90 mL/min (ref >90)
GFR calc non Af Amer: 89 mL/min — ABNORMAL LOW (ref >90)

## 2012-01-30 LAB — URINALYSIS, ROUTINE W REFLEX MICROSCOPIC
Glucose, UA: 100 mg/dL — AB
Ketones, ur: NEGATIVE mg/dL
Nitrite: NEGATIVE
Specific Gravity, Urine: 1.013 (ref 1.005–1.030)
pH: 6.5 (ref 5.0–8.0)

## 2012-01-30 LAB — CBC
HCT: 31.9 % — ABNORMAL LOW (ref 39.0–52.0)
Hemoglobin: 10 g/dL — ABNORMAL LOW (ref 13.0–17.0)
WBC: 10.6 10*3/uL — ABNORMAL HIGH (ref 4.0–10.5)

## 2012-01-31 ENCOUNTER — Institutional Professional Consult (permissible substitution) (HOSPITAL_COMMUNITY): Payer: Medicare Other

## 2012-01-31 LAB — BASIC METABOLIC PANEL
CO2: 22 mEq/L (ref 19–32)
Calcium: 8.9 mg/dL (ref 8.4–10.5)
Creatinine, Ser: 1.25 mg/dL (ref 0.50–1.35)
Glucose, Bld: 387 mg/dL — ABNORMAL HIGH (ref 70–99)

## 2012-01-31 LAB — CBC
HCT: 27.3 % — ABNORMAL LOW (ref 39.0–52.0)
MCH: 27.9 pg (ref 26.0–34.0)
MCV: 88.6 fL (ref 78.0–100.0)
Platelets: 460 10*3/uL — ABNORMAL HIGH (ref 150–400)
RBC: 3.08 MIL/uL — ABNORMAL LOW (ref 4.22–5.81)
RDW: 16.8 % — ABNORMAL HIGH (ref 11.5–15.5)

## 2012-02-01 ENCOUNTER — Other Ambulatory Visit (HOSPITAL_COMMUNITY): Payer: Medicare Other

## 2012-02-01 LAB — BASIC METABOLIC PANEL
BUN: 21 mg/dL (ref 6–23)
Chloride: 99 mEq/L (ref 96–112)
Creatinine, Ser: 1.02 mg/dL (ref 0.50–1.35)
GFR calc Af Amer: >90 mL/min (ref >90)
Glucose, Bld: 263 mg/dL — ABNORMAL HIGH (ref 70–99)

## 2012-02-01 LAB — CBC
HCT: 26.8 % — ABNORMAL LOW (ref 39.0–52.0)
MCH: 27.6 pg (ref 26.0–34.0)
MCHC: 31.3 g/dL (ref 30.0–36.0)
RDW: 17.1 % — ABNORMAL HIGH (ref 11.5–15.5)

## 2012-02-03 LAB — URINE CULTURE
Colony Count: >=100000
Culture  Setup Time: 201306012013

## 2012-02-05 LAB — CULTURE, BLOOD (ROUTINE X 2)
Culture  Setup Time: 201306012012
Culture: NO GROWTH

## 2012-03-24 ENCOUNTER — Encounter: Payer: Self-pay | Admitting: Cardiology

## 2012-03-24 ENCOUNTER — Ambulatory Visit (INDEPENDENT_AMBULATORY_CARE_PROVIDER_SITE_OTHER): Payer: Medicare Other | Admitting: Cardiology

## 2012-03-24 VITALS — BP 130/60 | HR 79 | Ht 67.0 in | Wt 154.0 lb

## 2012-03-24 DIAGNOSIS — E039 Hypothyroidism, unspecified: Secondary | ICD-10-CM

## 2012-03-24 DIAGNOSIS — D649 Anemia, unspecified: Secondary | ICD-10-CM

## 2012-03-24 DIAGNOSIS — K439 Ventral hernia without obstruction or gangrene: Secondary | ICD-10-CM

## 2012-03-24 DIAGNOSIS — D62 Acute posthemorrhagic anemia: Secondary | ICD-10-CM

## 2012-03-24 DIAGNOSIS — I214 Non-ST elevation (NSTEMI) myocardial infarction: Secondary | ICD-10-CM

## 2012-03-24 DIAGNOSIS — I2510 Atherosclerotic heart disease of native coronary artery without angina pectoris: Secondary | ICD-10-CM

## 2012-03-24 DIAGNOSIS — E109 Type 1 diabetes mellitus without complications: Secondary | ICD-10-CM

## 2012-03-24 LAB — CBC WITH DIFFERENTIAL/PLATELET
Basophils Absolute: 0.1 10*3/uL (ref 0.0–0.1)
Eosinophils Relative: 4.3 % (ref 0.0–5.0)
Lymphocytes Relative: 35.8 % (ref 12.0–46.0)
Monocytes Relative: 6.9 % (ref 3.0–12.0)
Neutrophils Relative %: 51.9 % (ref 43.0–77.0)
Platelets: 303 10*3/uL (ref 150.0–400.0)
RDW: 15.9 % — ABNORMAL HIGH (ref 11.5–14.6)
WBC: 7.9 10*3/uL (ref 4.5–10.5)

## 2012-03-24 LAB — BASIC METABOLIC PANEL
BUN: 27 mg/dL — ABNORMAL HIGH (ref 6–23)
Calcium: 10 mg/dL (ref 8.4–10.5)
Creatinine, Ser: 0.9 mg/dL (ref 0.4–1.5)
GFR: 96.58 mL/min (ref >60.00)
Glucose, Bld: 79 mg/dL (ref 70–99)
Potassium: 5 mEq/L (ref 3.5–5.1)

## 2012-03-24 MED ORDER — METOPROLOL TARTRATE 25 MG PO TABS: ORAL_TABLET | ORAL | Status: DC

## 2012-03-24 NOTE — Assessment & Plan Note (Signed)
Patient was severely anemic during his recent hospital stay.  We are rechecking his CBC today

## 2012-03-24 NOTE — Progress Notes (Signed)
Reginald Gutierrez Date of Birth:  Oct 21, 1965 [facility] [address] [place], [place]  [address] [phone]         Fax   [phone]  History of Present Illness: This 46 year old Caucasian male gentleman is seen for the first time in the office.  He has a history of juvenile onset diabetes mellitus.  He was admitted to [facility] long hospital on 12/18/11 with bilateral tibial fractures following a syncopal episode at home.  Postoperatively he developed acute hypoxia and refractory shock, transient ST segment depression, and a troponin leak.  He also developed towards requiring intubation and developed acute renal failure requiring continuous venovenous hemodialysis.  He had problems with slow weaning from the vent and ultimately tracheostomy placement.  Patient underwent intramedullary nails to his bilateral tibial fractures on the evening of admission.  Following a prolonged hospitalization the patient was discharged to [facility] nursing home for further recovery and physical therapy.  He was recently released from [place] and is now living back home by himself.  He walks with a walker.  He is 50% weightbearing at this time.  He has a ventral hernia and has seen Dr. Gaynelle Adu who has suggested operative repair.  The patient has not had a  Ischemic workup.  During his hospitalization his renal function prevented cardiac catheterization.  At the time of his initial presentation he appeared to have a non-STEMI secondary to demand ischemia related to sepsis and shock with minimal cardiac enzyme leak.  The patient still has somewhat unstable blood sugars.  He had an episode of profound hypoglycemia 3 days ago and states that his blood sugar dropped to 11.  EMS was called.  He was given several amps of D50 at home.  He declined hospitalization. The patient states that he does not consume any alcohol.  He denies any illicit drug use or cocaine use.  He still smokes an occasional cigarette  every several days. His medications were reviewed in detail today.  He has been taking a baby aspirin and is on statin therapy and an ACE inhibitor.  He was not on a beta blocker until today.  Current Outpatient Prescriptions  Medication Sig Dispense Refill  . aspirin 81 MG tablet Take 81 mg by mouth daily.      Marland Kitchen atorvastatin (LIPITOR) 40 MG tablet Take 40 mg by mouth daily.      . insulin glargine (LANTUS) 100 UNIT/ML injection Inject 100 Units into the skin daily.      . insulin regular (NOVOLIN R,HUMULIN R) 100 units/mL injection Inject into the skin as directed. As directed sliding scale      . levothyroxine (SYNTHROID, LEVOTHROID) 50 MCG tablet Take 1 tablet (50 mcg total) by mouth daily before breakfast.      . lisinopril (PRINIVIL,ZESTRIL) 40 MG tablet Take 40 mg by mouth daily.      Marland Kitchen DISCONTD: insulin glargine (LANTUS) 100 UNIT/ML injection Inject 38 Units into the skin daily.  10 mL    . metoprolol tartrate (LOPRESSOR) 25 MG tablet 1/2 tablet twice a day  30 tablet  5    No Known Allergies  Patient Active Problem List  Diagnosis  . HYPOTHYROIDISM  . DIABETES MELLITUS, TYPE I  . HYPERCHOLESTEROLEMIA  . DEPRESSION  . HYPERTENSION, BENIGN ESSENTIAL  . TOBACCO ABUSE  . Tibial fracture  . NSTEMI (non-ST elevated myocardial infarction)  . Acute blood loss anemia  . Acute respiratory failure  . Aspiration pneumonia  . Protein calorie  malnutrition  . Acute renal failure  . Encephalopathy acute  . Tracheostomy status  . OSA (obstructive sleep apnea)  . Hypoxemia  . Acute and chronic respiratory failure    History  Smoking status  . Current Everyday Smoker -- 0.5 packs/day  . Types: Cigarettes  Smokeless tobacco  . Never Used    History  Alcohol Use No    No family history on file.  Review of Systems: Constitutional: no fever chills diaphoresis or fatigue or change in weight.  Head and neck: no hearing loss, no epistaxis, no photophobia or visual  disturbance. Respiratory: No cough, shortness of breath or wheezing. Cardiovascular: No chest pain peripheral edema, palpitations. Gastrointestinal: No abdominal distention, no abdominal pain, no change in bowel habits hematochezia or melena. Genitourinary: No dysuria, no frequency, no urgency, no nocturia. Musculoskeletal:No arthralgias, no back pain, no gait disturbance or myalgias. Neurological: No dizziness, no headaches, no numbness, no seizures, no syncope, no weakness, no tremors. Hematologic: No lymphadenopathy, no easy bruising. Psychiatric: No confusion, no hallucinations, no sleep disturbance.    Physical Exam: Filed Vitals:   03/24/12 1059  BP: 130/60  Pulse: 79   the general appearance reveals a somewhat anxious agitated gentleman in no acute distress.Pupils equal and reactive.   Extraocular Movements are full.  There is no scleral icterus.  The mouth and pharynx are normal.  The neck is supple.  The carotids reveal no bruits.  The jugular venous pressure is normal.  The thyroid is not enlarged.  There is no lymphadenopathy.  The chest is clear to percussion and auscultation. There are no rales or rhonchi. Expansion of the chest is symmetrical.  The heart reveals a soft systolic ejection murmur at the base.The abdomen is soft and nontender. Bowel sounds are normal. The liver and spleen are not enlarged. There Are no abdominal masses. There are no bruits.  Extremities reveal no phlebitis or edema.  Pedal pulses are present. Neurologic not tested. The skin is warm and dry.  There is no rash.  EKG today done at Dr. French Ana Turner's office this morning shows normal sinus rhythm with poor R-wave progression V1 through V3 and nonspecific ST-T wave changes.   Assessment / Plan: 1.  History of non-STEMI in April 2013 felt to be secondary to demand ischemia from sepsis and shock. 2. history of left ventricular systolic dysfunction with echocardiogram on 12/28/11 showing ejection  fraction of 40-45% with hypokinesis of the mid distal inferoposterior and apical myocardium 3.  Ventral hernia 4.  Juvenile onset diabetes mellitus which is brittle and he has frequent severe hypoglycemic episodes.  Plan: We're checking a basal metabolic panel and a CBC today We have recommended that he return for a Lexiscan Myoview stress test which would be appropriate with his history of bilateral tibial fractures and 50% weightbearing status.  At this point he is refusing to undergo the test.  We will defer this testing until he is able to walk adequately on a treadmill. The patient will continue medical followup with his physicians at [facility] and he is establishing a relationship with a diabetes specialist also.  Recheck here for cardiology followup in one month.  Continue same medication and we are adding metoprolol tartrate 12.5 mg twice a day.

## 2012-03-24 NOTE — Patient Instructions (Addendum)
Add Metoprolol 25 mg 1/2 tablet twice a day, rx sent to [facility]  Add Aspirin 81 mg daily   Your physician recommends that you schedule a follow-up appointment in: 1 month   Will obtain labs today and call you with the results

## 2012-03-24 NOTE — Assessment & Plan Note (Signed)
The patient has had juvenile onset diabetes mellitus.  He has been taking Lantus 20 units twice a day.  He has Humulin R insulin available for sliding scale but has not had to use it recently.  He states that he has an appointment to see a diabetologist soon but he can't remember the name of the doctor today.

## 2012-03-24 NOTE — Assessment & Plan Note (Signed)
In view of his recent non-STEMI in April 2013 I have recommended that he have a nuclear stress test to evaluate him further.  However he refuses to undergo a Lexiscan study.  At this time he is not appropriate for a treadmill study because of his recent bilateral tibial fractures and the fact that he is still walking with a walker.  We will delay ischemic testing until this issue can be resolved.

## 2012-03-24 NOTE — Assessment & Plan Note (Signed)
The patient has a symptomatic ventral hernia.  He has discussed operative repair of this with Dr. Andrey Campanile.  In view of his recent myocardial infarction it would be appropriate to clear him with ischemic testing prior to elective surgery.

## 2012-03-25 ENCOUNTER — Telehealth: Payer: Self-pay | Admitting: Cardiology

## 2012-03-25 NOTE — Telephone Encounter (Signed)
Message copied by Burnell Blanks on Fri Mar 25, 2012 11:31 AM ------      Message from: Cassell Clement      Created: Thu Mar 24, 2012  4:36 PM       Please report.  His hemoglobin is better.  Continue on multivitamin.  His blood sugar is normal at 79.  His BUN is elevated suggesting that his kidneys are slightly dry.  Drink plenty of water.

## 2012-03-25 NOTE — Telephone Encounter (Signed)
Advised patient of lab results  

## 2012-03-25 NOTE — Telephone Encounter (Signed)
Pt rtn call 

## 2012-03-31 ENCOUNTER — Encounter (INDEPENDENT_AMBULATORY_CARE_PROVIDER_SITE_OTHER): Payer: Self-pay | Admitting: General Surgery

## 2012-04-05 ENCOUNTER — Telehealth (INDEPENDENT_AMBULATORY_CARE_PROVIDER_SITE_OTHER): Payer: Self-pay

## 2012-04-05 NOTE — Telephone Encounter (Signed)
Spoke with patient. He cancelled appt he had scheduled with Dr Andrey Campanile last week because he said he had to see his heart MD. He also had an appt in May 2013 which he cancelled. I do not have any open appts until 04/27/12. Appt made for that date. Patient informed he can call for cancellations.

## 2012-04-05 NOTE — Telephone Encounter (Signed)
Pt called to schedule an appt with Dr. Andrey Campanile for umbilical hernia repair.  He was seen by Dr. Andrey Campanile last year, but in the meantime suffered a heart attack and two broken legs, so was unable to schedule his surgery.  He would like to be seen asap and schedule surgery before September 1 if possible.  He loses his Medicaid at that point.  Pls call the pt.

## 2012-04-08 ENCOUNTER — Encounter: Payer: Self-pay | Admitting: Endocrinology

## 2012-04-08 ENCOUNTER — Ambulatory Visit (INDEPENDENT_AMBULATORY_CARE_PROVIDER_SITE_OTHER): Payer: Medicare Other | Admitting: Endocrinology

## 2012-04-08 VITALS — BP 112/58 | HR 72 | Temp 97.1°F | Ht 67.0 in | Wt 163.0 lb

## 2012-04-08 DIAGNOSIS — E109 Type 1 diabetes mellitus without complications: Secondary | ICD-10-CM

## 2012-04-08 NOTE — Patient Instructions (Addendum)
good diet and exercise habits significanly improve the control of your diabetes.  please let me know if you wish to be referred to a dietician.  high blood sugar is very risky to your health.  you should see an eye doctor every year. controlling your blood pressure and cholesterol drastically reduces the damage diabetes does to your body.  this also applies to quitting smoking.  please discuss these with your doctor.  you should take an aspirin every day, unless you have been advised by a doctor not to. check your blood sugar 6 times a day--before the 3 meals, and at bedtime.  also check if you have symptoms of your blood sugar being too high or too low.  please keep a record of the readings and bring it to your next appointment here.  please call us sooner if your blood sugar goes below 70, or if it stays over 200. blood tests are being requested for you today.  You will receive a letter with results. we will need to take this complex situation in stages For now, please reduce the lantus to 90 units daily. Also, take the regular insulin just 1 unit if it is the 200's, and 2 if it is over 300.   Please come back for a follow-up appointment in 2 weeks.

## 2012-04-08 NOTE — Progress Notes (Signed)
Subjective:    Patient ID: Reginald Gutierrez, male    DOB: [birth year], 46 y.o.   MRN: [id number]  HPI Pt was dx'ed with type 1 DM at age 66, and was started on insulin then.  it is complicated by renal failure, retinopathy, and CAD.  He says the chronic care of his DM has been complicated by frequent severe hypoglycemia.  He says these episodes are influenced by mood swings.  pt says his diet is fair, and exercise is limited by health problems.  He has a few mos of moderate pain at the legs, which started in the context of a fall (severe hypoglycemia), but no assoc numbness.  He fractured both legs.  He takes lantus 100 units/day, and prn regular (averages a total of 15-20 units/day).  He has hypoglycemia almost daily, usually in the afternoon and at 3 am.   Past Medical History  Diagnosis Date  . Diabetes mellitus type 1   . Hyperlipidemia   . Hypothyroidism   . Depression   . Umbilical hernia   . Hypertension   . Glaucoma     Past Surgical History  Procedure Date  . Appendectomy     open  . Tibia im nail insertion 12/18/2011    Procedure: INTRAMEDULLARY (IM) NAIL TIBIAL;  Surgeon: Eugenia Mcalpine, MD;  Location: [facility] OR[facility];  Service: Orthopedics;  Laterality: Bilateral;  . Insertion of dialysis catheter 12/31/2011    Procedure: INSERTION OF DIALYSIS CATHETER;  Surgeon: Chuck Hint, MD;  Location: [facility] OR;  Service: Vascular;  Laterality: N/A;  . Tonsillectomy 1980    History   Social History  . Marital Status: Single    Spouse Name: N/A    Number of Children: N/A  . Years of Education: 16   Occupational History  . Sales    Social History Main Topics  . Smoking status: Current Everyday Smoker -- 0.5 packs/day    Types: Cigarettes  . Smokeless tobacco: Never Used  . Alcohol Use: No  . Drug Use: No  . Sexually Active: No   Other Topics Concern  . Not on file   Social History Narrative  . No narrative on file    Current Outpatient Prescriptions on File Prior to Visit    Medication Sig Dispense Refill  . aspirin 81 MG tablet Take 81 mg by mouth daily.      Marland Kitchen atorvastatin (LIPITOR) 40 MG tablet Take 40 mg by mouth daily.      . insulin glargine (LANTUS) 100 UNIT/ML injection Inject 100 Units into the skin daily.      . insulin regular (NOVOLIN R,HUMULIN R) 100 units/mL injection Inject into the skin as directed. As directed sliding scale      . levothyroxine (SYNTHROID, LEVOTHROID) 50 MCG tablet Take 1 tablet (50 mcg total) by mouth daily before breakfast.      . lisinopril (PRINIVIL,ZESTRIL) 40 MG tablet Take 40 mg by mouth daily.      . metoprolol tartrate (LOPRESSOR) 25 MG tablet 1/2 tablet twice a day  30 tablet  5    No Known Allergies  No family history on file. No DM BP 112/58  Pulse 72  Temp 97.1 F (36.2 C) (Oral)  Ht 5\' 7"  (1.702 m)  Wt 163 lb (73.936 kg)  BMI 25.53 kg/m2  SpO2 98%  Review of Systems denies blurry vision, headache, chest pain, sob, n/v, urinary frequency, cramps, excessive diaphoresis, memory loss, rhinorrhea, and easy bruising.  He reports chronic depression, but  he does not like the way meds make him feel.  He has regained some of the weight he lost during his illness earlier this year.      Objective:   Physical Exam VS: see vs page GEN: no distress HEAD: head: no deformity eyes: no periorbital swelling, no proptosis external nose and ears are normal mouth: no lesion seen NECK: supple, thyroid is not enlarged CHEST WALL: no deformity LUNGS: clear to auscultation BREASTS:  No gynecomastia CV: reg rate and rhythm.  Soft systolic murmur ABD: abdomen is soft, nontender.  no hepatosplenomegaly.  not distended.  Small ventral hernia MUSCULOSKELETAL: muscle bulk and strength are grossly normal.  no obvious joint swelling.  gait is normal and steady. healed surgical scars on both knees EXTEMITIES: no deformity.  no ulcer on the feet.  feet are of normal color and temp.  no edema PULSES: dorsalis pedis intact bilat.  no  carotid bruit NEURO:  cn 2-12 grossly intact.   readily moves all 4's.  sensation is intact to touch on the feet SKIN:  Normal texture and temperature.  No rash or suspicious lesion is visible.   NODES:  None palpable at the neck PSYCH: alert, oriented x3.  Does not appear anxious nor depressed.    Assessment & Plan:  DM.  overcontrolled. Depression.  This complicates the rx of DM Tobacco abuse.  This accelerates the complications of DM Leg fractures, due to episode of severe hypoglycemia

## 2012-04-20 ENCOUNTER — Telehealth: Payer: Self-pay | Admitting: Cardiology

## 2012-04-20 NOTE — Telephone Encounter (Signed)
Called patient to discuss stress test.  Advised patient that at last office visit he didn't want to schedule recommended lexiscan.  He refused and wanted to do a walking test.  Advised  Dr. Patty Sermons didn't think walking test appropriate since he had recently suffered bilateral broken legs.  Asked patient if he had followed up with orthopedic doctor and he said yes and was released to do stress test.  Explained that  Dr. Patty Sermons would need an ok from his doctor to order that or they could discuss at office visit tomorrow.  Patient then asked if visit could be done same day as stress test and I explained stress test could not be done tomorrow.  Patient then stated he had to go and hung up.

## 2012-04-20 NOTE — Telephone Encounter (Signed)
New msg Pt wants to talk to you about lexiscan. Please call

## 2012-04-21 ENCOUNTER — Encounter: Payer: Self-pay | Admitting: Cardiology

## 2012-04-21 ENCOUNTER — Telehealth: Payer: Self-pay | Admitting: *Deleted

## 2012-04-21 ENCOUNTER — Ambulatory Visit (INDEPENDENT_AMBULATORY_CARE_PROVIDER_SITE_OTHER): Payer: Medicare Other | Admitting: Cardiology

## 2012-04-21 VITALS — BP 118/70 | HR 75 | Ht 67.0 in | Wt 164.0 lb

## 2012-04-21 DIAGNOSIS — I2510 Atherosclerotic heart disease of native coronary artery without angina pectoris: Secondary | ICD-10-CM

## 2012-04-21 DIAGNOSIS — I119 Hypertensive heart disease without heart failure: Secondary | ICD-10-CM

## 2012-04-21 DIAGNOSIS — D62 Acute posthemorrhagic anemia: Secondary | ICD-10-CM

## 2012-04-21 DIAGNOSIS — I214 Non-ST elevation (NSTEMI) myocardial infarction: Secondary | ICD-10-CM

## 2012-04-21 DIAGNOSIS — D649 Anemia, unspecified: Secondary | ICD-10-CM

## 2012-04-21 DIAGNOSIS — K439 Ventral hernia without obstruction or gangrene: Secondary | ICD-10-CM

## 2012-04-21 DIAGNOSIS — E7800 Pure hypercholesterolemia, unspecified: Secondary | ICD-10-CM

## 2012-04-21 DIAGNOSIS — I252 Old myocardial infarction: Secondary | ICD-10-CM

## 2012-04-21 LAB — CBC WITH DIFFERENTIAL/PLATELET
Basophils Absolute: 0 10*3/uL (ref 0.0–0.1)
Eosinophils Absolute: 0.2 10*3/uL (ref 0.0–0.7)
HCT: 32.3 % — ABNORMAL LOW (ref 39.0–52.0)
Hemoglobin: 10.7 g/dL — ABNORMAL LOW (ref 13.0–17.0)
Lymphs Abs: 1.7 10*3/uL (ref 0.7–4.0)
MCHC: 33.2 g/dL (ref 30.0–36.0)
Neutro Abs: 5 10*3/uL (ref 1.4–7.7)
RDW: 16.8 % — ABNORMAL HIGH (ref 11.5–14.6)

## 2012-04-21 LAB — BASIC METABOLIC PANEL
CO2: 25 mEq/L (ref 19–32)
Calcium: 9.2 mg/dL (ref 8.4–10.5)
Glucose, Bld: 32 mg/dL — CL (ref 70–99)
Potassium: 4.5 mEq/L (ref 3.5–5.1)
Sodium: 139 mEq/L (ref 135–145)

## 2012-04-21 MED ORDER — FERROUS SULFATE 325 (65 FE) MG PO TABS: 325.0000 mg | ORAL_TABLET | Freq: Every day | ORAL | Status: DC

## 2012-04-21 NOTE — Assessment & Plan Note (Signed)
The patient has a ventral hernia and he is interested in surgical repair but he wants to be sure that his heart is okay for surgery.

## 2012-04-21 NOTE — Telephone Encounter (Signed)
Advised patient and sent to Dr Everardo All

## 2012-04-21 NOTE — Assessment & Plan Note (Signed)
The patient had a previous hemoglobin in the hospital of 8.0.  His most recent hemoglobin several months ago was 11.  We're starting him on iron in the form of ferrous sulfate 325 mg one daily and we will plan to recheck a CBC in another 3 months

## 2012-04-21 NOTE — Patient Instructions (Addendum)
Have sent Rx for iron to [facility]  Your physician has requested that you have en exercise stress myoview. For further information please visit [email or web address] Please follow instruction sheet, as given.  Your physician recommends that you schedule a follow-up appointment in: 3 months with fasting labs (LP/BMET/HFP/CBC) and EKG

## 2012-04-21 NOTE — Assessment & Plan Note (Signed)
The patient has not had any recurrent chest pain or angina.  His orthopedist told him that his legs were doing well and it was okay for him to start walking for exercise.  The patient is concerned about his coronary artery status.  His electrocardiogram today is still shows poor R-wave progression suggesting anterior wall myocardial infarction.  His T-wave changes have improved.  We will have him return in about one month for a treadmill Myoview stress test.  He does not want to do the chemical stress test and he would like a month to strengthen his legs to get ready for the treadmill stress test.

## 2012-04-21 NOTE — Telephone Encounter (Signed)
Message copied by Burnell Blanks on Thu Apr 21, 2012  4:12 PM ------      Message from: Cassell Clement      Created: Thu Apr 21, 2012  2:17 PM       Please report.  The blood sugar was very low at 32.  He will need to discuss with his endocrinologist about his diabetes.      He is still mildly anemic with hemoglobin of 10.7.  This should improve with iron therapy as we discussed.  His kidney function is stable

## 2012-04-21 NOTE — Progress Notes (Signed)
Reginald Gutierrez Date of Birth:  1965/11/19 [facility] [address] [place], [place]  [address] [phone]         Fax   [phone] This 46 year old Caucasian male gentleman is seen for a followup office visit. He has a history of juvenile onset diabetes mellitus. He was admitted to [facility] long hospital on 12/18/11 with bilateral tibial fractures following a syncopal episode at home. Postoperatively he developed acute hypoxia and refractory shock, transient ST segment depression, and a troponin leak. He also developed ARDS requiring intubation and developed acute renal failure requiring continuous venovenous hemodialysis. He had problems with slow weaning from the vent and ultimately tracheostomy placement. Patient underwent intramedullary nails to his bilateral tibial fractures on the evening of admission. Following a prolonged hospitalization the patient was discharged to [facility] nursing home for further recovery and physical therapy. He was recently released from [place] and is now living back home by himself. He walks with a walker. He is 50% weightbearing at this time. He has a ventral hernia and has seen Dr. Gaynelle Adu who has suggested operative repair. The patient has not had a Ischemic workup. During his hospitalization his renal function prevented cardiac catheterization. At the time of his initial presentation he appeared to have a non-STEMI secondary to demand ischemia related to sepsis and shock with minimal cardiac enzyme leak. The patient still has somewhat unstable blood sugars. He has occasional severe hypoglycemic episodes The patient states that he does not consume any alcohol. He denies any illicit drug use or cocaine use. He still smokes an occasional cigarette every several days.  He smokes about one half pack of cigarettes a day  His medications were reviewed in detail today. He has been taking a baby aspirin and is on statin therapy and an ACE inhibitor  and on his last visit we added a beta blocker.   History of Present Illness:   Current Outpatient Prescriptions  Medication Sig Dispense Refill  . aspirin 81 MG tablet Take 81 mg by mouth daily.      Marland Kitchen atorvastatin (LIPITOR) 40 MG tablet Take 40 mg by mouth daily.      . insulin glargine (LANTUS) 100 UNIT/ML injection Inject 90 Units into the skin daily.       . insulin regular (NOVOLIN R,HUMULIN R) 100 units/mL injection Inject into the skin as directed. As directed sliding scale      . levothyroxine (SYNTHROID, LEVOTHROID) 50 MCG tablet Take 1 tablet (50 mcg total) by mouth daily before breakfast.      . lisinopril (PRINIVIL,ZESTRIL) 40 MG tablet Take 40 mg by mouth daily.      Marland Kitchen LORazepam (ATIVAN) 2 MG tablet Take 2 mg by mouth as needed.       . metoprolol tartrate (LOPRESSOR) 25 MG tablet 1/2 tablet twice a day  30 tablet  5  . ferrous sulfate 325 (65 FE) MG tablet Take 1 tablet (325 mg total) by mouth daily with breakfast.  30 tablet  11    No Known Allergies  Patient Active Problem List  Diagnosis  . HYPOTHYROIDISM  . DIABETES MELLITUS, TYPE I  . HYPERCHOLESTEROLEMIA  . DEPRESSION  . HYPERTENSION, BENIGN ESSENTIAL  . TOBACCO ABUSE  . Tibial fracture  . NSTEMI (non-ST elevated myocardial infarction)  . Acute blood loss anemia  . Acute respiratory failure  . Aspiration pneumonia  . Protein calorie malnutrition  . Acute renal failure  . Encephalopathy acute  . Tracheostomy status  .  OSA (obstructive sleep apnea)  . Hypoxemia  . Acute and chronic respiratory failure  . Ventral hernia    History  Smoking status  . Current Everyday Smoker -- 0.5 packs/day  . Types: Cigarettes  Smokeless tobacco  . Never Used    History  Alcohol Use No    No family history on file.  Review of Systems: Constitutional: no fever chills diaphoresis or fatigue or change in weight.  Head and neck: no hearing loss, no epistaxis, no photophobia or visual disturbance. Respiratory:  No cough, shortness of breath or wheezing. Cardiovascular: No chest pain peripheral edema, palpitations. Gastrointestinal: No abdominal distention, no abdominal pain, no change in bowel habits hematochezia or melena. Genitourinary: No dysuria, no frequency, no urgency, no nocturia. Musculoskeletal:No arthralgias, no back pain, no gait disturbance or myalgias. Neurological: No dizziness, no headaches, no numbness, no seizures, no syncope, no weakness, no tremors. Hematologic: No lymphadenopathy, no easy bruising. Psychiatric: No confusion, no hallucinations, no sleep disturbance.    Physical Exam: Filed Vitals:   04/21/12 1032  BP: 118/70  Pulse: 75   the general appearance reveals a well-developed well-nourished gentleman in no distress.The head and neck exam reveals pupils equal and reactive.  Extraocular movements are full.  There is no scleral icterus.  The mouth and pharynx are normal.  The neck is supple.  The carotids reveal no bruits.  The jugular venous pressure is normal.  The  thyroid is not enlarged.  There is no lymphadenopathy.  The chest is clear to percussion and auscultation.  There are no rales or rhonchi.  Expansion of the chest is symmetrical.  The precordium is quiet.  The first heart sound is normal.  The second heart sound is physiologically split.  There is no murmur gallop rub or click.  There is no abnormal lift or heave.  The abdomen is soft and nontender.  The bowel sounds are normal.  The liver and spleen are not enlarged.  There are no abdominal masses.  There are no abdominal bruits.  Extremities reveal good pedal pulses.  There is no phlebitis or edema.  There is no cyanosis or clubbing.  Strength is normal and symmetrical in all extremities.  There is no lateralizing weakness.  There are no sensory deficits.  The skin is warm and dry.  There is no rash.  EKG today shows improvement and nonspecific T-wave changes since the previous EKG of 01/08/12  Assessment /  Plan: Return in September for treadmill Myoview stress test about one month from now.  Meanwhile add ferrous sulfate 325 mg to be sure that his hemoglobin is optimal for the stress test.  On the morning of his stress test he will had to take just half the usual dose of Lantus insulin.  He will hold his beta blocker the night before and the morning of the stress test. He is inquiring about Viagra for erectile dysfunction but we will hold off on any prescription until after we see how he does on his treadmill. Recheck in 3 months for followup office visit EKG CBC lipid panel hepatic function panel and basal metabolic panel.

## 2012-04-25 ENCOUNTER — Encounter: Payer: Self-pay | Admitting: Endocrinology

## 2012-04-25 ENCOUNTER — Ambulatory Visit (INDEPENDENT_AMBULATORY_CARE_PROVIDER_SITE_OTHER): Payer: Medicare Other | Admitting: Endocrinology

## 2012-04-25 VITALS — BP 144/68 | HR 108 | Temp 97.1°F | Ht 66.0 in | Wt 164.0 lb

## 2012-04-25 DIAGNOSIS — E109 Type 1 diabetes mellitus without complications: Secondary | ICD-10-CM

## 2012-04-25 MED ORDER — INSULIN GLARGINE 100 UNIT/ML ~~LOC~~ SOLN: 85.0000 [IU] | Freq: Every day | SUBCUTANEOUS | Status: DC

## 2012-04-25 NOTE — Patient Instructions (Addendum)
check your blood sugar 6 times a day--before the 3 meals, and at bedtime.  also check if you have symptoms of your blood sugar being too high or too low.  please keep a record of the readings and bring it to your next appointment here.  please call us sooner if your blood sugar goes below 70, or if it stays over 200. blood tests are being requested for you today.  You will receive a letter with results. we will need to take this complex situation in stages For now, please reduce the lantus to 85 units daily. Also, take the regular insulin just 1 unit if it is over 300.   Please come back for a follow-up appointment in 2-4 weeks.  Please bring the report from your eye doctor.

## 2012-04-25 NOTE — Progress Notes (Signed)
Subjective:    Patient ID: Reginald Gutierrez, male    DOB: Oct 21, 1965, 46 y.o.   MRN: [id number]  HPI Pt returns for f/u of type 1 DM (dx'ed 1969; complicated by renal failure, retinopathy, and CAD).  He was first seen a few weeks ago.  The initial goal was to reduce hypoglycemia.  Since the insulin adjustments then, he has had hypoglycemia qod (any time of day), but no further LOC.  he brings a record of his cbg's which i have reviewed today.  It varies from 32-463, but most are in the 100's.   Past Medical History  Diagnosis Date  . Diabetes mellitus type 1   . Hyperlipidemia   . Depression   . Umbilical hernia   . Hypertension   . Glaucoma   . CHF (congestive heart failure)   . Heart attack 12/16/11  . Hypothyroidism     Past Surgical History  Procedure Date  . Appendectomy     open  . Tibia im nail insertion 12/18/2011    Procedure: INTRAMEDULLARY (IM) NAIL TIBIAL;  Surgeon: Eugenia Mcalpine, MD;  Location: [facility];  Service: Orthopedics;  Laterality: Bilateral;  . Insertion of dialysis catheter 12/31/2011    Procedure: INSERTION OF DIALYSIS CATHETER;  Surgeon: Chuck Hint, MD;  Location: [facility] OR;  Service: Vascular;  Laterality: N/A;  . Tonsillectomy 1980    History   Social History  . Marital Status: Single    Spouse Name: N/A    Number of Children: N/A  . Years of Education: 16   Occupational History  . Sales    Social History Main Topics  . Smoking status: Current Everyday Smoker -- 1.0 packs/day    Types: Cigarettes  . Smokeless tobacco: Never Used  . Alcohol Use: No  . Drug Use: No  . Sexually Active: No   Other Topics Concern  . Not on file   Social History Narrative  . No narrative on file    Current Outpatient Prescriptions on File Prior to Visit  Medication Sig Dispense Refill  . aspirin 81 MG tablet Take 81 mg by mouth daily.      Marland Kitchen atorvastatin (LIPITOR) 40 MG tablet Take 40 mg by mouth daily.      . ferrous sulfate 325 (65 FE) MG tablet Take 1  tablet (325 mg total) by mouth daily with breakfast.  30 tablet  11  . insulin glargine (LANTUS) 100 UNIT/ML injection Inject 85 Units into the skin daily.  30 mL  11  . insulin regular (NOVOLIN R,HUMULIN R) 100 units/mL injection Inject into the skin as directed. As directed sliding scale      . levothyroxine (SYNTHROID, LEVOTHROID) 50 MCG tablet Take 1 tablet (50 mcg total) by mouth daily before breakfast.      . lisinopril (PRINIVIL,ZESTRIL) 40 MG tablet Take 40 mg by mouth daily.      Marland Kitchen LORazepam (ATIVAN) 2 MG tablet Take 2 mg by mouth as needed.       . metoprolol tartrate (LOPRESSOR) 25 MG tablet 1/2 tablet twice a day  30 tablet  5    No Known Allergies  No family history on file.  BP 144/68  Pulse 108  Temp 97.1 F (36.2 C) (Oral)  Ht 5\' 6"  (1.676 m)  Wt 164 lb (74.39 kg)  BMI 26.47 kg/m2  SpO2 98%  Review of Systems Denies weight change.      Objective:   Physical Exam VITAL SIGNS:  See vs page  GENERAL: no distress SKIN:  Insulin injection sites at the anterior thighs are normal.    outside test results are reviewed: A1c=6.9    Assessment & Plan:  DM, with reduction of hypoglycemia

## 2012-04-26 ENCOUNTER — Telehealth: Payer: Self-pay | Admitting: *Deleted

## 2012-04-26 MED ORDER — GLUCOSE BLOOD VI STRP: ORAL_STRIP | Status: DC

## 2012-04-26 NOTE — Telephone Encounter (Signed)
Message copied by Carin Primrose on Tue Apr 26, 2012  2:17 PM ------      Message from: Romero Belling      Created: Mon Apr 25, 2012  4:25 PM       please call patient:      i reviewed chart.  i'll do the form.  Please send or bring back

## 2012-04-26 NOTE — Telephone Encounter (Signed)
Pt informed of MD's advisement via VM and to callback office with any questions/concerns.  

## 2012-04-27 ENCOUNTER — Encounter (INDEPENDENT_AMBULATORY_CARE_PROVIDER_SITE_OTHER): Payer: Self-pay | Admitting: General Surgery

## 2012-04-27 ENCOUNTER — Ambulatory Visit (INDEPENDENT_AMBULATORY_CARE_PROVIDER_SITE_OTHER): Payer: Medicare Other | Admitting: General Surgery

## 2012-04-27 VITALS — BP 122/76 | HR 76 | Temp 98.4°F | Resp 16 | Ht 67.0 in | Wt 161.6 lb

## 2012-04-27 DIAGNOSIS — I219 Acute myocardial infarction, unspecified: Secondary | ICD-10-CM | POA: Insufficient documentation

## 2012-04-27 DIAGNOSIS — K439 Ventral hernia without obstruction or gangrene: Secondary | ICD-10-CM

## 2012-04-27 NOTE — Progress Notes (Signed)
Patient ID: Reginald Gutierrez, male   DOB: 05-18-1966, 46 y.o.   MRN: [id number]  Chief Complaint  Patient presents with  . Hernia    re eval umb hernia LOV 03/11/11    HPI Reginald Gutierrez is a 46 y.o. male.   HPI 46 year old Caucasian male comes in for followup to discuss his ventral hernia. I last saw him in July 2012. At that time he was obese and smoking. He comes in to rediscuss hernia repair today. He states that he thinks the area has gotten larger and it is causing him some soreness in the area. He denies any nausea, vomiting, diarrhea or constipation. He reports a normal appetite  Unfortunately he had a very complicated late spring. He was admitted to the hospital in late April with bilateral tibial fractures. His postoperative course was compromised by hypoxia and shock. He went into ARDS and acute renal failure. He required temporary dialysis. He also had a non-ST elevated MI. He saw Dr. Patty Sermons a few weeks ago who has ordered an outpatient cardiac stress test.  Past Medical History  Diagnosis Date  . Diabetes mellitus type 1   . Hyperlipidemia   . Depression   . Umbilical hernia   . Hypertension   . Glaucoma   . CHF (congestive heart failure)   . Heart attack 12/16/11  . Hypothyroidism     Past Surgical History  Procedure Date  . Appendectomy     open  . Tibia im nail insertion 12/18/2011    Procedure: INTRAMEDULLARY (IM) NAIL TIBIAL;  Surgeon: Eugenia Mcalpine, MD;  Location: [facility];  Service: Orthopedics;  Laterality: Bilateral;  . Insertion of dialysis catheter 12/31/2011    Procedure: INSERTION OF DIALYSIS CATHETER;  Surgeon: Chuck Hint, MD;  Location: [facility] OR;  Service: Vascular;  Laterality: N/A;  . Tonsillectomy 1980    History reviewed. No pertinent family history.  Social History History  Substance Use Topics  . Smoking status: Current Everyday Smoker -- 1.0 packs/day    Types: Cigarettes  . Smokeless tobacco: Never Used  . Alcohol Use: No    No Known  Allergies  Current Outpatient Prescriptions  Medication Sig Dispense Refill  . aspirin 81 MG tablet Take 81 mg by mouth daily.      Marland Kitchen atorvastatin (LIPITOR) 40 MG tablet Take 40 mg by mouth daily.      . ferrous sulfate 325 (65 FE) MG tablet Take 1 tablet (325 mg total) by mouth daily with breakfast.  30 tablet  11  . glucose blood (ACCU-CHEK AVIVA) test strip 6x a day, and lancets 250.03; variable glucoses  180 each  12  . insulin glargine (LANTUS) 100 UNIT/ML injection Inject 85 Units into the skin daily.  30 mL  11  . insulin regular (NOVOLIN R,HUMULIN R) 100 units/mL injection Inject into the skin as directed. As directed sliding scale      . levothyroxine (SYNTHROID, LEVOTHROID) 50 MCG tablet Take 1 tablet (50 mcg total) by mouth daily before breakfast.      . lisinopril (PRINIVIL,ZESTRIL) 40 MG tablet Take 40 mg by mouth daily.      Marland Kitchen LORazepam (ATIVAN) 2 MG tablet Take 2 mg by mouth as needed.       . metoprolol tartrate (LOPRESSOR) 25 MG tablet 1/2 tablet twice a day  30 tablet  5    Review of Systems Review of Systems  Constitutional: Negative for fever, chills, appetite change and unexpected weight change.  HENT: Negative  for congestion and trouble swallowing.   Eyes: Negative for visual disturbance.  Respiratory: Negative for chest tightness and shortness of breath.   Cardiovascular: Negative for chest pain and leg swelling.       No PND, no orthopnea, no DOE  Gastrointestinal:       See HPI  Genitourinary: Negative for dysuria and hematuria.  Musculoskeletal: Negative.   Skin: Negative for rash.  Neurological: Negative for seizures and speech difficulty.  Hematological: Does not bruise/bleed easily.  Psychiatric/Behavioral: Negative for behavioral problems and confusion.    Blood pressure 122/76, pulse 76, temperature 98.4 F (36.9 C), temperature source Temporal, resp. rate 16, height 5\' 7"  (1.702 m), weight 161 lb 9.6 oz (73.301 kg).  Physical Exam Physical Exam    Vitals reviewed. Constitutional: He is oriented to person, place, and time. He appears well-developed and well-nourished. No distress.  HENT:  Head: Normocephalic and atraumatic.  Right Ear: External ear normal.  Left Ear: External ear normal.  Eyes: Conjunctivae are normal. No scleral icterus.  Neck: Normal range of motion. Neck supple. No tracheal deviation present. No thyromegaly present.       Old trach scar  Cardiovascular: Normal rate, regular rhythm and normal heart sounds.   Pulmonary/Chest: Effort normal and breath sounds normal. No respiratory distress. He has no wheezes.  Abdominal: Soft. Bowel sounds are normal. He exhibits no distension. There is no tenderness. There is no rebound. A hernia (reducible) is present. Hernia confirmed positive in the ventral area.    Musculoskeletal: He exhibits no edema.  Neurological: He is alert and oriented to person, place, and time.  Skin: Skin is warm and dry. He is not diaphoretic.  Psychiatric: He has a normal mood and affect. His behavior is normal. Judgment and thought content normal.    Data Reviewed Dr Yevonne Pax most recent office note Dr Gwendolyn Grant hospital d/c summary  Assessment    Large periumbilical ventral hernia    Plan    We discussed the etiology of ventral hernias. We discussed the signs and symptoms of incarceration and strangulation. The patient was given educational material. I also drew diagrams.  We discussed nonoperative and operative management. With respect to operative management, we discussed laparoscopic repair.   We discussed the risk and benefits of surgery including but not limited to bleeding, infection, injury to surrounding structures, hernia recurrence, mesh complications, hematoma/seroma formation, need to convert to an open procedure, blood clot formation, urinary retention, post operative ileus, general anesthesia risk, long-term abdominal pain. We discussed that this procedure can be quite  uncomfortable and difficult to recover from based on how the mesh is secured to the abdominal wall. We discussed the importance of avoiding heavy lifting and straining for a period of 6 weeks.  I told him my preference was to wait at least 6 months from his hospitalization in order to allow him to fully recover before undergoing a general anesthetic. Moreover I explained to him that he would need cardiology clearance prior to surgery. He states that his cardiologist had given clearance for surgery. I explained that I would need to see this in writing and that in his most recent office note I did not come across him being cleared for surgery. He is currently scheduled for a cardiac stress test in early October. I explained that I would be hesitant and reluctant to schedule him for an elective hernia repair prior to his heart being completely evaluated. I encouraged him to continue with his recovery and his physical  therapy. I also encouraged him to continue to try to stop smoking.  Followup 2 months  Mary Sella. Andrey Campanile, MD, FACS General, Bariatric, & Minimally Invasive Surgery [facility], [place]         [facility] M 04/27/2012, 1:11 PM

## 2012-04-27 NOTE — Patient Instructions (Signed)
Continue to exercise and try to stop smoking

## 2012-04-27 NOTE — Addendum Note (Signed)
Addended by: Regis Bill B on: 04/27/2012 03:12 PM   Modules accepted: Orders

## 2012-05-04 ENCOUNTER — Telehealth: Payer: Self-pay | Admitting: *Deleted

## 2012-05-04 NOTE — Telephone Encounter (Signed)
Called pt to inform him that we have mailed letter to [facility] for information that he requested to be sent to them-pt informed via VM and to callback office with any questions/concerns.

## 2012-05-06 ENCOUNTER — Encounter: Payer: Self-pay | Admitting: Cardiology

## 2012-05-26 ENCOUNTER — Ambulatory Visit: Payer: Medicare Other | Admitting: Endocrinology

## 2012-06-01 ENCOUNTER — Encounter (HOSPITAL_COMMUNITY): Payer: Medicare Other

## 2012-06-17 ENCOUNTER — Telehealth: Payer: Self-pay | Admitting: Cardiology

## 2012-06-17 NOTE — Telephone Encounter (Signed)
Received a call from the patients mother who is the contact person on his demographics page his DPR is Manning Charity. Mother stated she wanted to speak with  Dr. Patty Sermons about patient, advised he was not in the office.  Explained to the mother that we were not able to release any information and asked if he was having any problems.  She stated he was having problems with low blood sugar. Did try to advise that  Dr. Patty Sermons does not follow that and his PCP needed to be contacted, before I could explain it to her she said thank you, never mind and hung up

## 2012-06-17 NOTE — Telephone Encounter (Signed)
Plz return call to patient mother Ragen Macarthur [phone], she has questions about pt medical care.

## 2012-06-30 ENCOUNTER — Encounter (INDEPENDENT_AMBULATORY_CARE_PROVIDER_SITE_OTHER): Payer: Medicare Other | Admitting: General Surgery

## 2012-07-07 ENCOUNTER — Ambulatory Visit (HOSPITAL_COMMUNITY): Payer: Medicare Other | Attending: Internal Medicine | Admitting: Radiology

## 2012-07-07 VITALS — BP 110/67 | HR 71 | Ht 68.0 in | Wt 162.0 lb

## 2012-07-07 DIAGNOSIS — E119 Type 2 diabetes mellitus without complications: Secondary | ICD-10-CM | POA: Insufficient documentation

## 2012-07-07 DIAGNOSIS — I252 Old myocardial infarction: Secondary | ICD-10-CM

## 2012-07-07 DIAGNOSIS — I999 Unspecified disorder of circulatory system: Secondary | ICD-10-CM | POA: Insufficient documentation

## 2012-07-07 DIAGNOSIS — E109 Type 1 diabetes mellitus without complications: Secondary | ICD-10-CM

## 2012-07-07 DIAGNOSIS — I2510 Atherosclerotic heart disease of native coronary artery without angina pectoris: Secondary | ICD-10-CM

## 2012-07-07 DIAGNOSIS — E7800 Pure hypercholesterolemia, unspecified: Secondary | ICD-10-CM

## 2012-07-07 DIAGNOSIS — I119 Hypertensive heart disease without heart failure: Secondary | ICD-10-CM

## 2012-07-07 DIAGNOSIS — I10 Essential (primary) hypertension: Secondary | ICD-10-CM

## 2012-07-07 DIAGNOSIS — F17200 Nicotine dependence, unspecified, uncomplicated: Secondary | ICD-10-CM | POA: Insufficient documentation

## 2012-07-07 DIAGNOSIS — I219 Acute myocardial infarction, unspecified: Secondary | ICD-10-CM

## 2012-07-07 MED ORDER — TECHNETIUM TC 99M SESTAMIBI GENERIC - CARDIOLITE
10.0000 | Freq: Once | INTRAVENOUS | Status: AC | PRN
  Administered 2012-07-07: 10 via INTRAVENOUS

## 2012-07-07 MED ORDER — TECHNETIUM TC 99M SESTAMIBI GENERIC - CARDIOLITE
30.0000 | Freq: Once | INTRAVENOUS | Status: AC | PRN
  Administered 2012-07-07: 30 via INTRAVENOUS

## 2012-07-07 MED ORDER — REGADENOSON 0.4 MG/5ML IV SOLN
0.4000 mg | Freq: Once | INTRAVENOUS | Status: AC
  Administered 2012-07-07: 0.4 mg via INTRAVENOUS

## 2012-07-07 NOTE — Progress Notes (Signed)
[facility] SITE 3 NUCLEAR MED [address] [id number] [place] [address] [phone]  Cardiology Nuclear Med Study  Reginald Gutierrez is a 46 y.o. male     MRN : [id number]     DOB: Nov 07, 1965  Procedure Date: 07/07/2012  Nuclear Med Background Indication for Stress Test:  Evaluation for Ischemia and Pending Surgical Clearance for Hernia Repair by Dr. Gaynelle Adu History:  4/13 NSTEMI; 4/13 Echo:EF=45% Cardiac Risk Factors: Hypertension, IDDM Type 1, Lipids and Smoker  Symptoms:  No complaints since discharge.   Nuclear Pre-Procedure Caffeine/Decaff Intake:  None > 12 hrs NPO After: 10:00pm   Lungs:  Clear. O2 Sat: 96% on room air. IV 0.9% NS with Angio Cath:  22g  IV Site: L Antecubital x 1, tolerated well IV Started by:  Irean Hong, RN  Chest Size (in):  44 Cup Size: n/a  Height: 5\' 8"  (1.727 m)  Weight:  162 lb (73.483 kg)  BMI:  Body mass index is 24.63 kg/(m^2). Tech Comments:  Lopressor held x 24- hours. FBS was 121 at 7:15 am per patient. Took Lisinopril this am    Nuclear Med Study 1 or 2 day study: 1 day  Stress Test Type:  Treadmill/Lexiscan  Reading MD: Dietrich Pates, MD  Order Authorizing Provider:  Cassell Clement, MD  Resting Radionuclide: Technetium 96m Sestamibi  Resting Radionuclide Dose: 11.0 mCi   Stress Radionuclide:  Technetium 40m Sestamibi  Stress Radionuclide Dose: 33.0 mCi           Stress Protocol Rest HR: 71 Stress HR: 125  Rest BP: 110/67 Stress BP: 135/70  Exercise Time (min): 12:01 METS: 10.2   Predicted Max HR: 174 bpm % Max HR: 71.84 bpm Rate Pressure Product: 62952   Dose of Adenosine (mg):  n/a Dose of Lexiscan: n/a mg  Dose of Atropine (mg): n/a Dose of Dobutamine: n/a mcg/kg/min (at max HR)  Stress Test Technologist: Smiley Houseman, CMA-N  Nuclear Technologist:  Domenic Polite, CNMT     Rest Procedure:  Myocardial perfusion imaging was performed at rest 45 minutes following the intravenous administration  of Technetium 33m Sestamibi.  Rest ECG: Prior AWMI with nonspecific ST-T wave changes.  Stress Procedure:  The patient initially walked on the treadmill for 10:00, but was unable to obtain his target heart rate.  He then received IV Lexiscan 0.4 mg over 15-seconds with concurrent low level exercise and then Technetium 12m Sestamibi was injected at 30-seconds while the patient continued walking one more minute. There were nonspecific ST-T wave changes and occasional PAC's noted with Lexiscan. Quantitative spect images were obtained after a 45-minute delay.  Stress ECG: No significant ST changes with lexiscan.  QPS Raw Data Images:  Images were motion corrected.  Soft tissue (diaphragm) underlies inferior wall. Stress Images:  Decreased tracer counts in the inferior (base,mid, distal), inferolateral (base, mid, distal)  And apical walls.  Otherwise normal perfusion.   Rest Images:  Increased counts in the inferolateral wall (base, mid, distal) and incomplete improvement in apex.  Otherwise no change. Subtraction (SDS):  Consistent with ischemia and scar. Transient Ischemic Dilatation (Normal <1.22):  1.13 Lung/Heart Ratio (Normal <0.45):  0.37  Quantitative Gated Spect Images QGS EDV:  178 ml QGS ESV:  115 ml  Impression Exercise Capacity:  Lexiscan with low level exercise. BP Response:  Normal blood pressure response. Clinical Symptoms:  No significant symptoms noted. ECG Impression:  No significant ST changes with lexiscan. Comparison with Prior Nuclear Study: No previous  nuclear study performed  Overall Impression:  Scar in the inferior and inferoseptal walls.  Mld to mod inferolateral ischemia.  Minimal apical ischemia  LV Ejection Fraction: 35%.  LV Wall Motion:  Diffuse hypokinesis worse in the inferior region.  Dietrich Pates

## 2012-07-11 ENCOUNTER — Telehealth: Payer: Self-pay | Admitting: Cardiology

## 2012-07-11 NOTE — Telephone Encounter (Signed)
Pt rtn call to dr Patty Sermons from this am, pls call [phone]

## 2012-07-11 NOTE — Telephone Encounter (Signed)
Left message to call back  

## 2012-07-14 NOTE — Telephone Encounter (Signed)
Patient aware of appointment

## 2012-07-15 ENCOUNTER — Telehealth: Payer: Self-pay | Admitting: *Deleted

## 2012-07-15 NOTE — Telephone Encounter (Signed)
Patient aware of results and appointment with Dr [place] next week

## 2012-07-15 NOTE — Telephone Encounter (Signed)
Message copied by Burnell Blanks on Fri Jul 15, 2012  8:39 AM ------      Message from: Cassell Clement      Created: Mon Jul 11, 2012 12:56 PM       I reviewed the myocardial perfusion results on Reginald Gutierrez.  He has evidence of an old inferior wall myocardial infarction with a significant amount of reversible inferior ischemia.  He has decreased left ventricular systolic function with an ejection fraction estimated at 35%.  I have recommended cardiac catheterization.  The patient denies any recent symptoms of chest discomfort.  We will arrange for office consultation with invasive cardiology to discuss catheter with him further.

## 2012-07-19 ENCOUNTER — Encounter: Payer: Self-pay | Admitting: Cardiology

## 2012-07-19 ENCOUNTER — Encounter (HOSPITAL_COMMUNITY): Payer: Self-pay | Admitting: Respiratory Therapy

## 2012-07-19 ENCOUNTER — Other Ambulatory Visit: Payer: Self-pay | Admitting: Cardiology

## 2012-07-19 ENCOUNTER — Ambulatory Visit (INDEPENDENT_AMBULATORY_CARE_PROVIDER_SITE_OTHER): Payer: Medicare Other | Admitting: Cardiology

## 2012-07-19 VITALS — BP 115/68 | HR 82 | Ht 68.0 in | Wt 169.0 lb

## 2012-07-19 DIAGNOSIS — E7800 Pure hypercholesterolemia, unspecified: Secondary | ICD-10-CM

## 2012-07-19 DIAGNOSIS — R9439 Abnormal result of other cardiovascular function study: Secondary | ICD-10-CM

## 2012-07-19 DIAGNOSIS — F17200 Nicotine dependence, unspecified, uncomplicated: Secondary | ICD-10-CM

## 2012-07-19 DIAGNOSIS — I25810 Atherosclerosis of coronary artery bypass graft(s) without angina pectoris: Secondary | ICD-10-CM

## 2012-07-19 DIAGNOSIS — I2510 Atherosclerotic heart disease of native coronary artery without angina pectoris: Secondary | ICD-10-CM

## 2012-07-19 DIAGNOSIS — I214 Non-ST elevation (NSTEMI) myocardial infarction: Secondary | ICD-10-CM

## 2012-07-19 HISTORY — DX: Atherosclerotic heart disease of native coronary artery without angina pectoris: I25.10

## 2012-07-19 LAB — PROTIME-INR: Prothrombin Time: 11.5 s (ref 10.2–12.4)

## 2012-07-19 LAB — CBC WITH DIFFERENTIAL/PLATELET
Basophils Absolute: 0 10*3/uL (ref 0.0–0.1)
Eosinophils Absolute: 0.2 10*3/uL (ref 0.0–0.7)
Lymphocytes Relative: 23.6 % (ref 12.0–46.0)
MCHC: 33.2 g/dL (ref 30.0–36.0)
Neutro Abs: 5.9 10*3/uL (ref 1.4–7.7)
Neutrophils Relative %: 68.7 % (ref 43.0–77.0)
RDW: 15.9 % — ABNORMAL HIGH (ref 11.5–14.6)

## 2012-07-19 LAB — BASIC METABOLIC PANEL
Chloride: 100 mEq/L (ref 96–112)
Creatinine, Ser: 1 mg/dL (ref 0.4–1.5)

## 2012-07-19 LAB — APTT: aPTT: 28.2 s (ref 21.7–28.8)

## 2012-07-19 NOTE — Progress Notes (Signed)
Reginald Gutierrez Date of Birth: 12/24/1965 Medical Record #[id number]  History of Present Illness: Reginald Gutierrez is seen at the request of Dr. Patty Sermons for consideration of cardiac catheterization. He is a 46 year old white male with history of is independent diabetes mellitus. He was admitted in April of this year with a syncopal episode resulting in bilateral tibial fractures. He was extremely ill with ARDS requiring intubation and acute renal failure. He required short-term hemodialysis. He was difficult to wean and ultimately required a tracheostomy. He had repair of his bilateral tibial fractures with intramedullary nails. He ruled in for non-ST elevation myocardial infarction with transient ST segment depression. He did not undergo invasive evaluation at that time. An echocardiogram showed ejection fraction of 35-40% with inferior posterior wall motion abnormality. He was discharged to a nursing facility. Since that time his renal function has normalized. He has regained strength and is now able to walk independently. He states that over the past month he has tried to take better care of himself but is still not exercising to any extent and continues to smoke. He denies any symptoms of chest pain, shortness of breath, or edema. On recent LexiScan Myoview study he had evidence of inferior wall scar with significant inferolateral and apical ischemia. Ejection fraction was 35%. Coronary angiography was recommended.  Current Outpatient Prescriptions on File Prior to Visit  Medication Sig Dispense Refill  . aspirin 81 MG tablet Take 81 mg by mouth daily.      Marland Kitchen atorvastatin (LIPITOR) 40 MG tablet Take 40 mg by mouth daily.      . ferrous sulfate 325 (65 FE) MG tablet Take 1 tablet (325 mg total) by mouth daily with breakfast.  30 tablet  11  . glucose blood (ACCU-CHEK AVIVA) test strip 6x a day, and lancets 250.03; variable glucoses  180 each  12  . insulin glargine (LANTUS) 100 UNIT/ML injection Inject 85  Units into the skin daily.  30 mL  11  . insulin regular (NOVOLIN R,HUMULIN R) 100 units/mL injection Inject into the skin as directed. As directed sliding scale      . levothyroxine (SYNTHROID, LEVOTHROID) 50 MCG tablet Take 1 tablet (50 mcg total) by mouth daily before breakfast.      . lisinopril (PRINIVIL,ZESTRIL) 40 MG tablet Take 20 mg by mouth daily.       Marland Kitchen LORazepam (ATIVAN) 2 MG tablet Take 2 mg by mouth as needed.       . metoprolol tartrate (LOPRESSOR) 25 MG tablet 1/2 tablet twice a day  30 tablet  5    No Known Allergies  Past Medical History  Diagnosis Date  . Diabetes mellitus type 1   . Hyperlipidemia   . Depression   . Umbilical hernia   . Hypertension   . Glaucoma   . CHF (congestive heart failure)   . Heart attack 12/16/11  . Hypothyroidism   . CAD (coronary artery disease) of artery bypass graft 07/19/2012    Past Surgical History  Procedure Date  . Appendectomy     open  . Tibia im nail insertion 12/18/2011    Procedure: INTRAMEDULLARY (IM) NAIL TIBIAL;  Surgeon: Eugenia Mcalpine, MD;  Location: [facility];  Service: Orthopedics;  Laterality: Bilateral;  . Insertion of dialysis catheter 12/31/2011    Procedure: INSERTION OF DIALYSIS CATHETER;  Surgeon: Chuck Hint, MD;  Location: [facility] OR;  Service: Vascular;  Laterality: N/A;  . Tonsillectomy 1980    History  Smoking status  .  Current Every Day Smoker -- 1.0 packs/day  . Types: Cigarettes  Smokeless tobacco  . Never Used    History  Alcohol Use No    History reviewed. No pertinent family history.  Review of Systems: The review of systems is positive for sedentary lifestyle. He reports he is disabled because of his diabetes. He does have occasional severe hypoglycemic episodes. All other systems were reviewed and are negative.  Physical Exam: BP 115/68  Pulse 82  Ht 5\' 8"  (1.727 m)  Wt 76.658 kg (169 lb)  BMI 25.70 kg/m2 He is a well-developed white male in no acute distress. HEENT exam:  Normocephalic, atraumatic. Pupils are equal round and reactive. Sclera are clear. Oropharynx is clear. Neck is supple with a healed tracheostomy scar. He has no JVD or carotid bruits. There is no adenopathy or thyromegaly. Lungs: Clear Cardiovascular: Regular rate and rhythm, normal S1 and S2, no gallop, murmur, or click. Abdomen: Soft and nontender. Bowel sounds are normal. No hepatosplenomegaly or masses. Extremities: No cyanosis, clubbing, or edema. Pedal pulses are palpable. Radial pulses are good. Skin: Warm and dry Neuro: Alert and oriented x3. Cranial nerves II through XII are intact. No focal findings. LABORATORY DATA: ECG on 04/27/2012 shows normal sinus rhythm with poor R wave progression V1 through V4 consistent with old anterior infarction. Cardiology Nuclear Med Study  Reginald Gutierrez is a 46 y.o. male MRN : [id number] DOB: 11/01/1965  Procedure Date: 07/07/2012  Nuclear Med Background  Indication for Stress Test: Evaluation for Ischemia and Pending Surgical Clearance for Hernia Repair by Dr. Gaynelle Adu  History: 4/13 NSTEMI; 4/13 Echo:EF=45%  Cardiac Risk Factors: Hypertension, IDDM Type 1, Lipids and Smoker  Symptoms: No complaints since discharge.  Nuclear Pre-Procedure  Caffeine/Decaff Intake: None > 12 hrs  NPO After: 10:00pm   Lungs: Clear.  O2 Sat: 96% on room air.  IV 0.9% NS with Angio Cath: 22g   IV Site: L Antecubital x 1, tolerated well  IV Started by: Irean Hong, RN   Chest Size (in): 44  Cup Size: n/a   Height: 5\' 8"  (1.727 m)  Weight: 162 lb (73.483 kg)   BMI: Body mass index is 24.63 kg/(m^2).  Tech Comments: Lopressor held x 24- hours. FBS was 121 at 7:15 am per patient. Took Lisinopril this am   Nuclear Med Study  1 or 2 day study: 1 day  Stress Test Type: Treadmill/Lexiscan   Reading MD: Dietrich Pates, MD  Order Authorizing Provider: Cassell Clement, MD   Resting Radionuclide: Technetium 82m Sestamibi  Resting Radionuclide Dose: 11.0 mCi   Stress Radionuclide:  Technetium 47m Sestamibi  Stress Radionuclide Dose: 33.0 mCi   Stress Protocol  Rest HR: 71  Stress HR: 125   Rest BP: 110/67  Stress BP: 135/70   Exercise Time (min): 12:01  METS: 10.2   Predicted Max HR: 174 bpm  % Max HR: 71.84 bpm  Rate Pressure Product: 82956  Dose of Adenosine (mg): n/a  Dose of Lexiscan: n/a mg   Dose of Atropine (mg): n/a  Dose of Dobutamine: n/a mcg/kg/min (at max HR)   Stress Test Technologist: Smiley Houseman, CMA-N  Nuclear Technologist: Domenic Polite, CNMT   Rest Procedure: Myocardial perfusion imaging was performed at rest 45 minutes following the intravenous administration of Technetium 64m Sestamibi.  Rest ECG: Prior AWMI with nonspecific ST-T wave changes.  Stress Procedure: The patient initially walked on the treadmill for 10:00, but was unable to obtain his target heart rate. He then  received IV Lexiscan 0.4 mg over 15-seconds with concurrent low level exercise and then Technetium 29m Sestamibi was injected at 30-seconds while the patient continued walking one more minute. There were nonspecific ST-T wave changes and occasional PAC's noted with Lexiscan. Quantitative spect images were obtained after a 45-minute delay.  Stress ECG: No significant ST changes with lexiscan.  QPS  Raw Data Images: Images were motion corrected. Soft tissue (diaphragm) underlies inferior wall.  Stress Images: Decreased tracer counts in the inferior (base,mid, distal), inferolateral (base, mid, distal) And apical walls. Otherwise normal perfusion.  Rest Images: Increased counts in the inferolateral wall (base, mid, distal) and incomplete improvement in apex. Otherwise no change.  Subtraction (SDS): Consistent with ischemia and scar.  Transient Ischemic Dilatation (Normal <1.22): 1.13  Lung/Heart Ratio (Normal <0.45): 0.37  Quantitative Gated Spect Images  QGS EDV: 178 ml  QGS ESV: 115 ml  Impression  Exercise Capacity: Lexiscan with low level exercise.  BP Response: Normal  blood pressure response.  Clinical Symptoms: No significant symptoms noted.  ECG Impression: No significant ST changes with lexiscan.  Comparison with Prior Nuclear Study: No previous nuclear study performed  Overall Impression: Scar in the inferior and inferoseptal walls. Mld to mod inferolateral ischemia. Minimal apical ischemia  LV Ejection Fraction: 35%. LV Wall Motion: Diffuse hypokinesis worse in the inferior region.  Dietrich Pates  Assessment / Plan: 1. Coronary disease status post non-ST elevation myocardial infarction in April of this year. Recent Myoview study is high risk showing evidence of inferior scar with inferior lateral and apical ischemia. Ejection fraction is 35%. I recommended diagnostic cardiac catheterization to assess coronary anatomy and to determine optimal treatment. Potential for percutaneous intervention was also explained in detail. Procedure and risk were explained in detail.The procedure and risks were reviewed including but not limited to death, myocardial infarction, stroke, arrythmias, bleeding, transfusion, emergency surgery, dye allergy, or renal dysfunction. The patient voices understanding and is agreeable to proceed. 2. Insulin-dependent diabetes mellitus. 3. Ischemic cardiomyopathy. Ejection fraction 35%. 4. Hyperlipidemia. 5. Chronic anemia. 6. Tobacco abuse. Patient is counseled on smoking cessation.

## 2012-07-21 ENCOUNTER — Other Ambulatory Visit: Payer: Self-pay | Admitting: *Deleted

## 2012-07-21 DIAGNOSIS — I10 Essential (primary) hypertension: Secondary | ICD-10-CM

## 2012-07-21 DIAGNOSIS — I214 Non-ST elevation (NSTEMI) myocardial infarction: Secondary | ICD-10-CM

## 2012-07-21 DIAGNOSIS — E7800 Pure hypercholesterolemia, unspecified: Secondary | ICD-10-CM

## 2012-07-22 ENCOUNTER — Ambulatory Visit: Payer: Medicare Other | Admitting: Cardiology

## 2012-07-22 ENCOUNTER — Ambulatory Visit (HOSPITAL_COMMUNITY)
Admission: RE | Admit: 2012-07-22 | Discharge: 2012-07-22 | Disposition: A | Discharge status: Home / Self Care | Payer: Medicare Other | Source: Ambulatory Visit | Attending: Cardiology | Admitting: Cardiology

## 2012-07-22 ENCOUNTER — Other Ambulatory Visit: Payer: Medicare Other

## 2012-07-22 ENCOUNTER — Encounter (HOSPITAL_COMMUNITY)
Admission: RE | Disposition: A | Discharge status: Home / Self Care | Payer: Self-pay | Source: Ambulatory Visit | Attending: Cardiology

## 2012-07-22 DIAGNOSIS — I509 Heart failure, unspecified: Secondary | ICD-10-CM | POA: Insufficient documentation

## 2012-07-22 DIAGNOSIS — I2510 Atherosclerotic heart disease of native coronary artery without angina pectoris: Secondary | ICD-10-CM | POA: Insufficient documentation

## 2012-07-22 DIAGNOSIS — E039 Hypothyroidism, unspecified: Secondary | ICD-10-CM | POA: Insufficient documentation

## 2012-07-22 DIAGNOSIS — I2589 Other forms of chronic ischemic heart disease: Secondary | ICD-10-CM | POA: Insufficient documentation

## 2012-07-22 DIAGNOSIS — I252 Old myocardial infarction: Secondary | ICD-10-CM | POA: Insufficient documentation

## 2012-07-22 DIAGNOSIS — Z794 Long term (current) use of insulin: Secondary | ICD-10-CM | POA: Insufficient documentation

## 2012-07-22 DIAGNOSIS — F17200 Nicotine dependence, unspecified, uncomplicated: Secondary | ICD-10-CM | POA: Insufficient documentation

## 2012-07-22 DIAGNOSIS — D649 Anemia, unspecified: Secondary | ICD-10-CM | POA: Insufficient documentation

## 2012-07-22 DIAGNOSIS — H409 Unspecified glaucoma: Secondary | ICD-10-CM | POA: Insufficient documentation

## 2012-07-22 DIAGNOSIS — Z01812 Encounter for preprocedural laboratory examination: Secondary | ICD-10-CM | POA: Insufficient documentation

## 2012-07-22 DIAGNOSIS — E785 Hyperlipidemia, unspecified: Secondary | ICD-10-CM | POA: Insufficient documentation

## 2012-07-22 DIAGNOSIS — E109 Type 1 diabetes mellitus without complications: Secondary | ICD-10-CM | POA: Insufficient documentation

## 2012-07-22 HISTORY — PX: LEFT HEART CATHETERIZATION WITH CORONARY ANGIOGRAM: SHX5451

## 2012-07-22 LAB — GLUCOSE, CAPILLARY: Glucose-Capillary: 95 mg/dL (ref 70–99)

## 2012-07-22 SURGERY — LEFT HEART CATHETERIZATION WITH CORONARY ANGIOGRAM
Anesthesia: LOCAL

## 2012-07-22 MED ORDER — FENTANYL CITRATE 0.05 MG/ML IJ SOLN
INTRAMUSCULAR | Status: AC
  Filled 2012-07-22: qty 4

## 2012-07-22 MED ORDER — LIDOCAINE HCL (PF) 1 % IJ SOLN
INTRAMUSCULAR | Status: AC
  Filled 2012-07-22: qty 30

## 2012-07-22 MED ORDER — VERAPAMIL HCL 2.5 MG/ML IV SOLN
INTRAVENOUS | Status: AC
  Filled 2012-07-22: qty 2

## 2012-07-22 MED ORDER — HEPARIN (PORCINE) IN NACL 2-0.9 UNIT/ML-% IJ SOLN
INTRAMUSCULAR | Status: AC
  Filled 2012-07-22: qty 1000

## 2012-07-22 MED ORDER — NITROGLYCERIN 0.2 MG/ML ON CALL CATH LAB
INTRAVENOUS | Status: AC
  Filled 2012-07-22: qty 1

## 2012-07-22 MED ORDER — SODIUM CHLORIDE 0.9 % IV SOLN: 250.0000 mL | INTRAVENOUS | Status: DC | PRN

## 2012-07-22 MED ORDER — HEPARIN SODIUM (PORCINE) 1000 UNIT/ML IJ SOLN
INTRAMUSCULAR | Status: AC
  Filled 2012-07-22: qty 1

## 2012-07-22 MED ORDER — MIDAZOLAM HCL 2 MG/2ML IJ SOLN
INTRAMUSCULAR | Status: AC
  Filled 2012-07-22: qty 2

## 2012-07-22 MED ORDER — ACETAMINOPHEN 325 MG PO TABS: 650.0000 mg | ORAL_TABLET | ORAL | Status: DC | PRN

## 2012-07-22 MED ORDER — ASPIRIN 81 MG PO CHEW
324.0000 mg | CHEWABLE_TABLET | ORAL | Status: AC
  Administered 2012-07-22: 324 mg via ORAL
  Filled 2012-07-22: qty 4

## 2012-07-22 MED ORDER — SODIUM CHLORIDE 0.9 % IV SOLN: INTRAVENOUS | Status: DC

## 2012-07-22 MED ORDER — ONDANSETRON HCL 4 MG/2ML IJ SOLN: 4.0000 mg | Freq: Four times a day (QID) | INTRAMUSCULAR | Status: DC | PRN

## 2012-07-22 MED ORDER — DIAZEPAM 5 MG PO TABS
5.0000 mg | ORAL_TABLET | ORAL | Status: AC
  Administered 2012-07-22: 5 mg via ORAL
  Filled 2012-07-22: qty 1

## 2012-07-22 MED ORDER — SODIUM CHLORIDE 0.9 % IJ SOLN: 3.0000 mL | Freq: Two times a day (BID) | INTRAMUSCULAR | Status: DC

## 2012-07-22 MED ORDER — SODIUM CHLORIDE 0.9 % IJ SOLN: 3.0000 mL | INTRAMUSCULAR | Status: DC | PRN

## 2012-07-22 MED ORDER — SODIUM CHLORIDE 0.9 % IV SOLN
INTRAVENOUS | Status: DC
  Administered 2012-07-22: 07:00:00 via INTRAVENOUS

## 2012-07-22 NOTE — H&P (View-Only) (Signed)
 Reginald Gutierrez Date of Birth: 08/05/1966 Medical Record #[id number]  History of Present Illness: [name] is seen at the request of Dr. Brackbill for consideration of cardiac catheterization. He is a 46-year-old white male with history of is independent diabetes mellitus. He was admitted in April of this year with a syncopal episode resulting in bilateral tibial fractures. He was extremely ill with ARDS requiring intubation and acute renal failure. He required short-term hemodialysis. He was difficult to wean and ultimately required a tracheostomy. He had repair of his bilateral tibial fractures with intramedullary nails. He ruled in for non-ST elevation myocardial infarction with transient ST segment depression. He did not undergo invasive evaluation at that time. An echocardiogram showed ejection fraction of 35-40% with inferior posterior wall motion abnormality. He was discharged to a nursing facility. Since that time his renal function has normalized. He has regained strength and is now able to walk independently. He states that over the past month he has tried to take better care of himself but is still not exercising to any extent and continues to smoke. He denies any symptoms of chest pain, shortness of breath, or edema. On recent LexiScan Myoview study he had evidence of inferior wall scar with significant inferolateral and apical ischemia. Ejection fraction was 35%. Coronary angiography was recommended.  Current Outpatient Prescriptions on File Prior to Visit  Medication Sig Dispense Refill  . aspirin 81 MG tablet Take 81 mg by mouth daily.      . atorvastatin (LIPITOR) 40 MG tablet Take 40 mg by mouth daily.      . ferrous sulfate 325 (65 FE) MG tablet Take 1 tablet (325 mg total) by mouth daily with breakfast.  30 tablet  11  . glucose blood (ACCU-CHEK AVIVA) test strip 6x a day, and lancets 250.03; variable glucoses  180 each  12  . insulin glargine (LANTUS) 100 UNIT/ML injection Inject 85  Units into the skin daily.  30 mL  11  . insulin regular (NOVOLIN R,HUMULIN R) 100 units/mL injection Inject into the skin as directed. As directed sliding scale      . levothyroxine (SYNTHROID, LEVOTHROID) 50 MCG tablet Take 1 tablet (50 mcg total) by mouth daily before breakfast.      . lisinopril (PRINIVIL,ZESTRIL) 40 MG tablet Take 20 mg by mouth daily.       . LORazepam (ATIVAN) 2 MG tablet Take 2 mg by mouth as needed.       . metoprolol tartrate (LOPRESSOR) 25 MG tablet 1/2 tablet twice a day  30 tablet  5    No Known Allergies  Past Medical History  Diagnosis Date  . Diabetes mellitus type 1   . Hyperlipidemia   . Depression   . Umbilical hernia   . Hypertension   . Glaucoma   . CHF (congestive heart failure)   . Heart attack 12/16/11  . Hypothyroidism   . CAD (coronary artery disease) of artery bypass graft 07/19/2012    Past Surgical History  Procedure Date  . Appendectomy     open  . Tibia im nail insertion 12/18/2011    Procedure: INTRAMEDULLARY (IM) NAIL TIBIAL;  Surgeon: Robert Collins, MD;  Location: [facility];  Service: Orthopedics;  Laterality: Bilateral;  . Insertion of dialysis catheter 12/31/2011    Procedure: INSERTION OF DIALYSIS CATHETER;  Surgeon: Christopher S Dickson, MD;  Location: [facility] OR;  Service: Vascular;  Laterality: N/A;  . Tonsillectomy 1980    History  Smoking status  .   Current Every Day Smoker -- 1.0 packs/day  . Types: Cigarettes  Smokeless tobacco  . Never Used    History  Alcohol Use No    History reviewed. No pertinent family history.  Review of Systems: The review of systems is positive for sedentary lifestyle. He reports he is disabled because of his diabetes. He does have occasional severe hypoglycemic episodes. All other systems were reviewed and are negative.  Physical Exam: BP 115/68  Pulse 82  Ht 5' 8" (1.727 m)  Wt 76.658 kg (169 lb)  BMI 25.70 kg/m2 He is a well-developed white male in no acute distress. HEENT exam:  Normocephalic, atraumatic. Pupils are equal round and reactive. Sclera are clear. Oropharynx is clear. Neck is supple with a healed tracheostomy scar. He has no JVD or carotid bruits. There is no adenopathy or thyromegaly. Lungs: Clear Cardiovascular: Regular rate and rhythm, normal S1 and S2, no gallop, murmur, or click. Abdomen: Soft and nontender. Bowel sounds are normal. No hepatosplenomegaly or masses. Extremities: No cyanosis, clubbing, or edema. Pedal pulses are palpable. Radial pulses are good. Skin: Warm and dry Neuro: Alert and oriented x3. Cranial nerves II through XII are intact. No focal findings. LABORATORY DATA: ECG on 04/27/2012 shows normal sinus rhythm with poor R wave progression V1 through V4 consistent with old anterior infarction. Cardiology Nuclear Med Study  Reginald Gutierrez is a 46 y.o. male MRN : [id number] DOB: 02/22/1966  Procedure Date: 07/07/2012  Nuclear Med Background  Indication for Stress Test: Evaluation for Ischemia and Pending Surgical Clearance for Hernia Repair by Dr. Eric Wilson  History: 4/13 NSTEMI; 4/13 Echo:EF=45%  Cardiac Risk Factors: Hypertension, IDDM Type 1, Lipids and Smoker  Symptoms: No complaints since discharge.  Nuclear Pre-Procedure  Caffeine/Decaff Intake: None > 12 hrs  NPO After: 10:00pm   Lungs: Clear.  O2 Sat: 96% on room air.  IV 0.9% NS with Angio Cath: 22g   IV Site: L Antecubital x 1, tolerated well  IV Started by: Patsy Edwards, RN   Chest Size (in): 44  Cup Size: n/a   Height: 5' 8" (1.727 m)  Weight: 162 lb (73.483 kg)   BMI: Body mass index is 24.63 kg/(m^2).  Tech Comments: Lopressor held x 24- hours. FBS was 121 at 7:15 am per patient. Took Lisinopril this am   Nuclear Med Study  1 or 2 day study: 1 day  Stress Test Type: Treadmill/Lexiscan   Reading MD: Paula Ross, MD  Order Authorizing Provider: Thomas Brackbill, MD   Resting Radionuclide: Technetium 99m Sestamibi  Resting Radionuclide Dose: 11.0 mCi   Stress Radionuclide:  Technetium 99m Sestamibi  Stress Radionuclide Dose: 33.0 mCi   Stress Protocol  Rest HR: 71  Stress HR: 125   Rest BP: 110/67  Stress BP: 135/70   Exercise Time (min): 12:01  METS: 10.2   Predicted Max HR: 174 bpm  % Max HR: 71.84 bpm  Rate Pressure Product: 16875  Dose of Adenosine (mg): n/a  Dose of Lexiscan: n/a mg   Dose of Atropine (mg): n/a  Dose of Dobutamine: n/a mcg/kg/min (at max HR)   Stress Test Technologist: Sherri Ballard, CMA-N  Nuclear Technologist: Stephen Carbone, CNMT   Rest Procedure: Myocardial perfusion imaging was performed at rest 45 minutes following the intravenous administration of Technetium 99m Sestamibi.  Rest ECG: Prior AWMI with nonspecific ST-T wave changes.  Stress Procedure: The patient initially walked on the treadmill for 10:00, but was unable to obtain his target heart rate. He then   received IV Lexiscan 0.4 mg over 15-seconds with concurrent low level exercise and then Technetium 99m Sestamibi was injected at 30-seconds while the patient continued walking one more minute. There were nonspecific ST-T wave changes and occasional PAC's noted with Lexiscan. Quantitative spect images were obtained after a 45-minute delay.  Stress ECG: No significant ST changes with lexiscan.  QPS  Raw Data Images: Images were motion corrected. Soft tissue (diaphragm) underlies inferior wall.  Stress Images: Decreased tracer counts in the inferior (base,mid, distal), inferolateral (base, mid, distal) And apical walls. Otherwise normal perfusion.  Rest Images: Increased counts in the inferolateral wall (base, mid, distal) and incomplete improvement in apex. Otherwise no change.  Subtraction (SDS): Consistent with ischemia and scar.  Transient Ischemic Dilatation (Normal <1.22): 1.13  Lung/Heart Ratio (Normal <0.45): 0.37  Quantitative Gated Spect Images  QGS EDV: 178 ml  QGS ESV: 115 ml  Impression  Exercise Capacity: Lexiscan with low level exercise.  BP Response: Normal  blood pressure response.  Clinical Symptoms: No significant symptoms noted.  ECG Impression: No significant ST changes with lexiscan.  Comparison with Prior Nuclear Study: No previous nuclear study performed  Overall Impression: Scar in the inferior and inferoseptal walls. Mld to mod inferolateral ischemia. Minimal apical ischemia  LV Ejection Fraction: 35%. LV Wall Motion: Diffuse hypokinesis worse in the inferior region.  Paula Ross  Assessment / Plan: 1. Coronary disease status post non-ST elevation myocardial infarction in April of this year. Recent Myoview study is high risk showing evidence of inferior scar with inferior lateral and apical ischemia. Ejection fraction is 35%. I recommended diagnostic cardiac catheterization to assess coronary anatomy and to determine optimal treatment. Potential for percutaneous intervention was also explained in detail. Procedure and risk were explained in detail.The procedure and risks were reviewed including but not limited to death, myocardial infarction, stroke, arrythmias, bleeding, transfusion, emergency surgery, dye allergy, or renal dysfunction. The patient voices understanding and is agreeable to proceed. 2. Insulin-dependent diabetes mellitus. 3. Ischemic cardiomyopathy. Ejection fraction 35%. 4. Hyperlipidemia. 5. Chronic anemia. 6. Tobacco abuse. Patient is counseled on smoking cessation. 

## 2012-07-22 NOTE — Progress Notes (Signed)
Attempted to remove 3cc of air from TRB. Site bled- 3 cc of air replaced.

## 2012-07-22 NOTE — Progress Notes (Signed)
Late entry- Attempted to place 2nd IV per MD order at [phone].  Patient refused stating that he only had one vein and that he was not getting stuck 3 or 4 times.  Reported information to Janne Napoleon who consulted with MD.

## 2012-07-22 NOTE — Interval H&P Note (Signed)
History and Physical Interval Note:  07/22/2012 7:51 AM  Reginald Gutierrez  has presented today for surgery, with the diagnosis of cp  The various methods of treatment have been discussed with the patient and family. After consideration of risks, benefits and other options for treatment, the patient has consented to  Procedure(s) (LRB) with comments: LEFT HEART CATHETERIZATION WITH CORONARY ANGIOGRAM (N/A) as a surgical intervention .  The patient's history has been reviewed, patient examined, no change in status, stable for surgery.  I have reviewed the patient's chart and labs.  Questions were answered to the patient's satisfaction.     Theron Arista [facility] 07/22/2012 7:51 AM

## 2012-07-22 NOTE — CV Procedure (Signed)
   Cardiac Catheterization Procedure Note  Name: Reginald Gutierrez MRN: [id number] DOB: [birth year]  Procedure: Left Heart Cath, Selective Coronary Angiography, LV angiography  Indication: 46 year old white male with history of insulin-dependent diabetes who is status post non-ST elevation myocardial infarction. Prior hospital course was complicated with ARDS and acute renal failure. He has now recovered. LexiScan Myoview study was high-risk showing an ejection fraction of 35% with inferior wall scar and a large area of inferolateral ischemia.   Procedural Details: The right wrist was prepped, draped, and anesthetized with 1% lidocaine. Using the modified Seldinger technique, a 5 French sheath was introduced into the right radial artery. 3 mg of verapamil was administered through the sheath, weight-based unfractionated heparin was administered intravenously. Standard Judkins catheters were used for selective coronary angiography and left ventriculography. Catheter exchanges were performed over an exchange length guidewire. There were no immediate procedural complications. A TR band was used for radial hemostasis at the completion of the procedure.  The patient was transferred to the post catheterization recovery area for further monitoring.  Procedural Findings: Hemodynamics: AO 88/47 with a mean of 62 mmHg LV 91/10 mmHg  Coronary angiography: Coronary dominance: right  Left mainstem: The left main coronary is moderately calcified. It is short and without significant disease.  Left anterior descending (LAD): The left anterior descending artery is also moderately calcified. There is diffuse disease in the proximal vessel up to 70%. The mid vessel is also diffusely diseased up to 80%. There is an 80% stenosis at the takeoff of the first diagonal branch.  Left circumflex (LCx): There is a 60% stenosis at the ostium of the left circumflex. There is then an immediate takeoff of a large first marginal  branch which has diffuse segmental 80% stenosis proximally. The second obtuse marginal vessel is without significant disease. The left circumflex gives rise to significant collaterals to the posterior lateral branches and PDA of the right coronary.  Right coronary artery (RCA): The right coronary is a dominant vessel. There is 30% stenosis in the mid vessel. The distal vessel is occluded. The PDA and 2 posterior lateral branches fill by left-to-right collaterals.  Left ventriculography: Left ventricular systolic function is abnormal. There is severe hypokinesis of the inferior wall. The apex is akinetic. Overall there is moderate systolic dysfunction with ejection fraction estimated at 35-40%. There is no significant mitral insufficiency.   Final Conclusions:   1. Severe three-vessel obstructive coronary disease. 2. Moderate left ventricular dysfunction.  Recommendations:  The patient has diffuse coronary disease is that not suitable for percutaneous intervention. Given his LV dysfunction and diabetes he should be considered for revascularization with coronary bypass surgery.  Theron Arista [facility] 07/22/2012, 8:35 AM

## 2012-08-04 ENCOUNTER — Encounter (INDEPENDENT_AMBULATORY_CARE_PROVIDER_SITE_OTHER): Payer: Medicare Other | Admitting: General Surgery

## 2012-08-08 ENCOUNTER — Telehealth: Payer: Self-pay | Admitting: Endocrinology

## 2012-08-09 ENCOUNTER — Encounter: Payer: Self-pay | Admitting: Cardiology

## 2012-08-09 ENCOUNTER — Ambulatory Visit (INDEPENDENT_AMBULATORY_CARE_PROVIDER_SITE_OTHER): Payer: Medicare Other | Admitting: Cardiology

## 2012-08-09 VITALS — BP 110/50 | HR 63 | Ht 67.2 in | Wt 172.0 lb

## 2012-08-09 DIAGNOSIS — I214 Non-ST elevation (NSTEMI) myocardial infarction: Secondary | ICD-10-CM

## 2012-08-09 DIAGNOSIS — F17200 Nicotine dependence, unspecified, uncomplicated: Secondary | ICD-10-CM

## 2012-08-09 DIAGNOSIS — I259 Chronic ischemic heart disease, unspecified: Secondary | ICD-10-CM

## 2012-08-09 DIAGNOSIS — E109 Type 1 diabetes mellitus without complications: Secondary | ICD-10-CM

## 2012-08-09 NOTE — Progress Notes (Signed)
Reginald Gutierrez Date of Birth:  May 17, 1966 [facility] [address] [place], [place]  [address] [phone]         Fax   [phone]  History of Present Illness: This 46 year old Caucasian male gentleman is seen for a followup office visit. He has a history of juvenile onset diabetes mellitus. He was admitted to [facility] long hospital on 12/18/11 with bilateral tibial fractures following a syncopal episode at home. Postoperatively he developed acute hypoxia and refractory shock, transient ST segment depression, and a troponin leak. He also developed ARDS requiring intubation and developed acute renal failure requiring continuous venovenous hemodialysis. He had problems with slow weaning from the vent and ultimately tracheostomy placement. Patient underwent intramedullary nails to his bilateral tibial fractures on the evening of admission. Following a prolonged hospitalization the patient was discharged to [facility] nursing home for further recovery and physical therapy. He was subsequently released from [place] and is now living back home by himself.   He has a ventral hernia and has seen Dr. Gaynelle Adu who has suggested operative repair.  . At the time of his initial presentation he appeared to have a non-STEMI secondary to demand ischemia related to sepsis and shock with minimal cardiac enzyme leak. The patient still has somewhat unstable blood sugars. He has occasional severe hypoglycemic episodes  The patient underwent a nuclear stress test in November 2013 which was high-risk showing an ejection fraction of 35% with inferior wall scar and a large area of inferolateral ischemia.  The stress test was done as clearance for possible ventral hernia surgery.  Because of the results of the stress test, cardiac catheterization was recommended and on 07/22/12 the patient underwent left heart cardiac catheterization by Dr. [place] the cardiac catheterization showed severe three-vessel  obstructive coronary disease and moderate left ventricular dysfunction with ejection fraction estimated at 35-40%.  He did not have lesions suitable for percutaneous intervention and given his left ventricular dysfunction and his diabetes it was felt that he should be considered for coronary artery bypass graft surgery.  However so far the patient has refused to consider open heart surgery.  Of note is the fact that the patient has not been experiencing any exertional chest pains.  Current Outpatient Prescriptions  Medication Sig Dispense Refill  . aspirin 81 MG tablet Take 81 mg by mouth daily.      Marland Kitchen atorvastatin (LIPITOR) 40 MG tablet Take 40 mg by mouth daily.      . ferrous sulfate 325 (65 FE) MG tablet Take 1 tablet (325 mg total) by mouth daily with breakfast.  30 tablet  11  . insulin glargine (LANTUS) 100 UNIT/ML injection Inject 50 Units into the skin daily.      . insulin regular (NOVOLIN R,HUMULIN R) 100 units/mL injection Inject 1 Units into the skin as directed. As directed sliding scale      . levothyroxine (SYNTHROID, LEVOTHROID) 50 MCG tablet Take 1 tablet (50 mcg total) by mouth daily before breakfast.      . lisinopril (PRINIVIL,ZESTRIL) 20 MG tablet Take 20 mg by mouth daily.      Marland Kitchen LORazepam (ATIVAN) 2 MG tablet Take 2 mg by mouth as needed. For anxiety      . metoprolol tartrate (LOPRESSOR) 25 MG tablet Take 12.5 mg by mouth 2 (two) times daily.      Marland Kitchen lisinopril (PRINIVIL,ZESTRIL) 40 MG tablet Take 20 mg by mouth daily.         No Known Allergies  Patient Active Problem List  Diagnosis  . HYPOTHYROIDISM  . DIABETES MELLITUS, TYPE I  . HYPERCHOLESTEROLEMIA  . DEPRESSION  . HYPERTENSION, BENIGN ESSENTIAL  . TOBACCO ABUSE  . Tibial fracture  . NSTEMI (non-ST elevated myocardial infarction)  . Acute blood loss anemia  . Acute respiratory failure  . Aspiration pneumonia  . Protein calorie malnutrition  . Acute renal failure  . Encephalopathy acute  . Tracheostomy  status  . OSA (obstructive sleep apnea)  . Hypoxemia  . Acute and chronic respiratory failure  . Ventral hernia  . Heart attack  . CAD (coronary artery disease) of artery bypass graft  . Abnormal cardiovascular stress test    History  Smoking status  . Current Every Day Smoker -- 1.0 packs/day  . Types: Cigarettes  Smokeless tobacco  . Never Used    History  Alcohol Use No    No family history on file.  Review of Systems: Constitutional: no fever chills diaphoresis or fatigue or change in weight.  Head and neck: no hearing loss, no epistaxis, no photophobia or visual disturbance. Respiratory: No cough, shortness of breath or wheezing. Cardiovascular: No chest pain peripheral edema, palpitations. Gastrointestinal: No abdominal distention, no abdominal pain, no change in bowel habits hematochezia or melena. Genitourinary: No dysuria, no frequency, no urgency, no nocturia. Musculoskeletal:No arthralgias, no back pain, no gait disturbance or myalgias. Neurological: No dizziness, no headaches, no numbness, no seizures, no syncope, no weakness, no tremors. Hematologic: No lymphadenopathy, no easy bruising. Psychiatric: No confusion, no hallucinations, no sleep disturbance.    Physical Exam: Filed Vitals:   08/09/12 1526  BP: 110/50  Pulse: 63   the general appearance reveals a very volatile and emotionally labile gentleman.The head and neck exam reveals pupils equal and reactive.  Extraocular movements are full.  There is no scleral icterus.  The mouth and pharynx are normal.  The neck is supple.  The carotids reveal no bruits.  The jugular venous pressure is normal.  The  thyroid is not enlarged.  There is no lymphadenopathy.  The chest is clear to percussion and auscultation.  There are no rales or rhonchi.  Expansion of the chest is symmetrical.  The precordium is quiet.  The first heart sound is normal.  The second heart sound is physiologically split.  There is no murmur  gallop rub or click.  There is no abnormal lift or heave.  The abdomen is soft and nontender.  The bowel sounds are normal.  The liver and spleen are not enlarged.  There are no abdominal masses.  There are no abdominal bruits.  Extremities reveal good pedal pulses.  There is no phlebitis or edema.  There is no cyanosis or clubbing.  Strength is normal and symmetrical in all extremities.  There is no lateralizing weakness.  There are no sensory deficits.  The skin is warm and dry.  There is no rash.  EKG today shows normal sinus rhythm and no ischemic changes at rest.  He has poor R-wave progression V1 through V3   Assessment / Plan:  At this point the patient is not willing to consider coronary bypass graft surgery.  He is going to try to treat himself with stopping smoking and with careful diet and good diabetic control.  He will think about surgery further and let us know if he changes his mind.  I also offered to send him to a teaching hospital such as [place], [facility], or [place] if that would make  him feel more secure.  He will continue same medication and be rechecked here in 4 months for followup office visit, or sooner when necessary.  He understands that his present coronary situation would make him high risk for any elective surgical procedure such as ventral hernia repair. Recheck here in 4 months for followup office visit, or sooner when necessary.

## 2012-08-09 NOTE — Assessment & Plan Note (Signed)
The patient continues to smoke cigarettes but is trying to quit.

## 2012-08-09 NOTE — Assessment & Plan Note (Signed)
The patient has been trying to be more careful with his diet.  He has not had any recent hypoglycemic episodes.

## 2012-08-09 NOTE — Patient Instructions (Addendum)
Your physician recommends that you continue on your current medications as directed. Please refer to the Current Medication list given to you today. Your physician wants you to follow-up in: 4 months You will receive a reminder letter in the mail two months in advance. If you don't receive a letter, please call our office to schedule the follow-up appointment.  

## 2012-08-09 NOTE — Assessment & Plan Note (Signed)
The patient denies any palpitations or chest pains.

## 2012-09-05 ENCOUNTER — Ambulatory Visit: Payer: Medicare Other | Admitting: Endocrinology

## 2012-09-28 ENCOUNTER — Ambulatory Visit: Payer: Medicare Other | Admitting: Endocrinology

## 2012-09-30 ENCOUNTER — Encounter: Payer: Self-pay | Admitting: Endocrinology

## 2012-09-30 ENCOUNTER — Ambulatory Visit (INDEPENDENT_AMBULATORY_CARE_PROVIDER_SITE_OTHER): Payer: Medicare Other | Admitting: Endocrinology

## 2012-09-30 VITALS — BP 140/74 | HR 84 | Wt 163.0 lb

## 2012-09-30 DIAGNOSIS — E109 Type 1 diabetes mellitus without complications: Secondary | ICD-10-CM

## 2012-09-30 LAB — HEMOGLOBIN A1C: Hgb A1c MFr Bld: 7.1 % — ABNORMAL HIGH (ref 4.6–6.5)

## 2012-09-30 NOTE — Progress Notes (Signed)
Subjective:    Patient ID: Reginald Gutierrez, male    DOB: 04-Aug-1966, 47 y.o.   MRN: [id number]  HPI Pt returns for f/u of type 1 DM (dx'ed 1969; complicated by renal failure, retinopathy, and CAD (refused CABG)).  2 mos ago, he had a episode of severe hypoglycemia.  He seldom takes the regular insulin, as he does not need it.   Past Medical History  Diagnosis Date  . Diabetes mellitus type 1   . Hyperlipidemia   . Depression   . Umbilical hernia   . Hypertension   . Glaucoma   . CHF (congestive heart failure)   . Heart attack 12/16/11  . Hypothyroidism   . CAD (coronary artery disease) of artery bypass graft 07/19/2012    Past Surgical History  Procedure Date  . Appendectomy     open  . Tibia im nail insertion 12/18/2011    Procedure: INTRAMEDULLARY (IM) NAIL TIBIAL;  Surgeon: Eugenia Mcalpine, MD;  Location: [facility] ORS;  Service: Orthopedics;  Laterality: Bilateral;  . Insertion of dialysis catheter 12/31/2011    Procedure: INSERTION OF DIALYSIS CATHETER;  Surgeon: Chuck Hint, MD;  Location: [facility] OR;  Service: Vascular;  Laterality: N/A;  . Tonsillectomy 1980    History   Social History  . Marital Status: Single    Spouse Name: N/A    Number of Children: 0  . Years of Education: 16   Occupational History  . Sales- disabled    Social History Main Topics  . Smoking status: Current Every Day Smoker -- 1.0 packs/day    Types: Cigarettes  . Smokeless tobacco: Never Used  . Alcohol Use: No  . Drug Use: No  . Sexually Active: No   Other Topics Concern  . Not on file   Social History Narrative  . No narrative on file    Current Outpatient Prescriptions on File Prior to Visit  Medication Sig Dispense Refill  . aspirin 81 MG tablet Take 81 mg by mouth daily.      Marland Kitchen atorvastatin (LIPITOR) 40 MG tablet Take 40 mg by mouth daily.      . ferrous sulfate 325 (65 FE) MG tablet Take 1 tablet (325 mg total) by mouth daily with breakfast.  30 tablet  11  . insulin glargine  (LANTUS) 100 UNIT/ML injection Inject 35 Units into the skin daily.       . insulin regular (NOVOLIN R,HUMULIN R) 100 units/mL injection Inject 1 Units into the skin as directed. For any cbg over 300      . levothyroxine (SYNTHROID, LEVOTHROID) 50 MCG tablet Take 1 tablet (50 mcg total) by mouth daily before breakfast.      . lisinopril (PRINIVIL,ZESTRIL) 20 MG tablet Take 20 mg by mouth daily.      Marland Kitchen lisinopril (PRINIVIL,ZESTRIL) 40 MG tablet Take 20 mg by mouth daily.       Marland Kitchen LORazepam (ATIVAN) 2 MG tablet Take 2 mg by mouth as needed. For anxiety      . metoprolol tartrate (LOPRESSOR) 25 MG tablet Take 12.5 mg by mouth 2 (two) times daily.        No Known Allergies  No family history on file.  BP 140/74  Pulse 84  Wt 163 lb (73.936 kg)  SpO2 95%  Review of Systems Denies weight change    Objective:   Physical Exam VITAL SIGNS:  See vs page GENERAL: no distress Pulses: dorsalis pedis intact bilat, but decreased from normal.   Feet:  no deformity.  no ulcer on the feet.  feet are of normal color and temp.  no edema. Neuro: sensation is intact to touch on the feet.   Lab Results  Component Value Date   HGBA1C 7.1* 09/30/2012      Assessment & Plan:  DM: overcontrolled.  The reason for his reduced insulin requirements is unclear.

## 2012-09-30 NOTE — Patient Instructions (Addendum)
check your blood sugar 6 times a day--before the 3 meals, and at bedtime.  also check if you have symptoms of your blood sugar being too high or too low.  please keep a record of the readings and bring it to your next appointment here.  please call us sooner if your blood sugar goes below 70, or if it stays over 200. For now, please reduce the lantus to 35 units daily.   Please come back for a follow-up appointment in 3 months.   blood tests are being requested for you today.  We'll contact you with results.

## 2012-12-16 ENCOUNTER — Telehealth: Payer: Self-pay | Admitting: Cardiology

## 2012-12-16 NOTE — Telephone Encounter (Signed)
**Note De-Identified  Obfuscation** Pt states that he and Dr. Patty Sermons discussed him having hernia repair at his last OV on 08/09/12 and that Dr. Benson [place] told him that he was going to give him a letter to take to Dr. Gaynelle Adu but he has not heard back from [place] concerning the letter. Pt is advised that in his last OV notes Dr. Patty Sermons described him as a high cardiac risk for elective surgery such as hernia repair. Pt requested that I fax his last OV notes to Dr. Andrey Campanile which I have done and he is asking for a return call from Dr. Patty Sermons or his nurse Juliette Alcide. Pt is aware that they will not be in office until 4/21.

## 2012-12-16 NOTE — Telephone Encounter (Signed)
New problem    Aware that Juliette Alcide is off today .    Patient calling stating he needs a note from Dr. Patty Sermons for cardiac clearance for hernia surgery . Informed patient that he will need to come into the office due to his last office visit was in  08/09/2012 . Patient stated no he does not need to come into the office. Patient is aware that message will be sent to triage nurse for review.  Patient is asking for a call back today.

## 2012-12-19 ENCOUNTER — Ambulatory Visit (INDEPENDENT_AMBULATORY_CARE_PROVIDER_SITE_OTHER): Payer: Medicare Other | Admitting: General Surgery

## 2012-12-19 NOTE — Telephone Encounter (Signed)
Advised patient office note sent over

## 2012-12-21 NOTE — Telephone Encounter (Signed)
Done .patient saw Dr Everardo All on 1-31

## 2013-01-31 ENCOUNTER — Encounter: Payer: Self-pay | Admitting: Endocrinology

## 2013-01-31 ENCOUNTER — Ambulatory Visit: Payer: Medicare Other | Admitting: Endocrinology

## 2013-01-31 ENCOUNTER — Ambulatory Visit (INDEPENDENT_AMBULATORY_CARE_PROVIDER_SITE_OTHER): Payer: Medicare Other | Admitting: Endocrinology

## 2013-01-31 VITALS — BP 126/70 | HR 75 | Ht 65.0 in | Wt 156.0 lb

## 2013-01-31 DIAGNOSIS — E109 Type 1 diabetes mellitus without complications: Secondary | ICD-10-CM

## 2013-01-31 LAB — HEMOGLOBIN A1C: Hgb A1c MFr Bld: 7.2 % — ABNORMAL HIGH (ref 4.6–6.5)

## 2013-01-31 MED ORDER — "INSULIN SYRINGE-NEEDLE U-100 31G X 5/16"" 0.5 ML MISC": 1.0000 | Freq: Every day | Status: DC

## 2013-01-31 MED ORDER — GLUCAGON HCL (RDNA) 1 MG IJ SOLR: 1.0000 mg | Freq: Once | INTRAMUSCULAR | Status: DC | PRN

## 2013-01-31 NOTE — Progress Notes (Signed)
Subjective:    Patient ID: Reginald Gutierrez, male    DOB: 12-26-1965, 47 y.o.   MRN: [id number]  HPI Pt returns for f/u of type 1 DM (dx'ed 1969; he has mild if any neuropathy of the lower extremities; he has associated renal failure, retinopathy, PAD, and CAD (refused CABG)).  In late 2013, he had a episode of severe hypoglycemia.  no cbg record, but states cbg's are mildly low approx once a month.  This happens with activity, or with missing a meal.  pt states he feels well in general.  He says he'll reconsider having the CABG.   Past Medical History  Diagnosis Date  . Diabetes mellitus type 1   . Hyperlipidemia   . Depression   . Umbilical hernia   . Hypertension   . Glaucoma   . CHF (congestive heart failure)   . Heart attack 12/16/11  . Hypothyroidism   . CAD (coronary artery disease) of artery bypass graft 07/19/2012    Past Surgical History  Procedure Laterality Date  . Appendectomy      open  . Tibia im nail insertion  12/18/2011    Procedure: INTRAMEDULLARY (IM) NAIL TIBIAL;  Surgeon: Eugenia Mcalpine, MD;  Location: [facility];  Service: Orthopedics;  Laterality: Bilateral;  . Insertion of dialysis catheter  12/31/2011    Procedure: INSERTION OF DIALYSIS CATHETER;  Surgeon: Chuck Hint, MD;  Location: [facility] OR;  Service: Vascular;  Laterality: N/A;  . Tonsillectomy  1980    History   Social History  . Marital Status: Single    Spouse Name: N/A    Number of Children: 0  . Years of Education: 16   Occupational History  . Sales- disabled    Social History Main Topics  . Smoking status: Current Every Day Smoker -- 1.00 packs/day    Types: Cigarettes  . Smokeless tobacco: Never Used  . Alcohol Use: No  . Drug Use: No  . Sexually Active: No   Other Topics Concern  . Not on file   Social History Narrative  . No narrative on file    Current Outpatient Prescriptions on File Prior to Visit  Medication Sig Dispense Refill  . aspirin 81 MG tablet Take 81 mg by mouth  daily.      Marland Kitchen atorvastatin (LIPITOR) 40 MG tablet Take 40 mg by mouth daily.      . ferrous sulfate 325 (65 FE) MG tablet Take 1 tablet (325 mg total) by mouth daily with breakfast.  30 tablet  11  . insulin glargine (LANTUS) 100 UNIT/ML injection Inject 35 Units into the skin daily.       . insulin regular (NOVOLIN R,HUMULIN R) 100 units/mL injection Inject 1 Units into the skin as directed. For any cbg over 300      . lisinopril (PRINIVIL,ZESTRIL) 40 MG tablet Take 20 mg by mouth daily.       Marland Kitchen LORazepam (ATIVAN) 2 MG tablet Take 2 mg by mouth as needed. For anxiety      . metoprolol tartrate (LOPRESSOR) 25 MG tablet Take 12.5 mg by mouth 2 (two) times daily.      Marland Kitchen levothyroxine (SYNTHROID, LEVOTHROID) 50 MCG tablet Take 1 tablet (50 mcg total) by mouth daily before breakfast.       No current facility-administered medications on file prior to visit.    No Known Allergies  No family history on file.  BP 126/70  Pulse 75  Ht 5\' 5"  (1.651 m)  Wt 156 lb (70.761 kg)  BMI 25.96 kg/m2  SpO2 97%  Review of Systems Denies LOC and weight change    Objective:   Physical Exam VITAL SIGNS:  See vs page GENERAL: no distress.  Lab Results  Component Value Date   HGBA1C 7.2* 01/31/2013      Assessment & Plan:  DM: This insulin regimen was chosen from multiple options, due to its simplicity.  The benefits of glycemic control must be weighed against the risks of hypoglycemia.

## 2013-01-31 NOTE — Patient Instructions (Addendum)
check your blood sugar 6 times a day--before the 3 meals, and at bedtime.  also check if you have symptoms of your blood sugar being too high or too low.  please keep a record of the readings and bring it to your next appointment here.  please call us sooner if your blood sugar goes below 70, or if it stays over 200. Please come back for a follow-up appointment in 3 months.   A diabetes blood test is requested for you today.  We'll contact you with results.

## 2013-02-01 ENCOUNTER — Telehealth: Payer: Self-pay | Admitting: *Deleted

## 2013-02-01 NOTE — Telephone Encounter (Signed)
Dr. Glade Lloyd reviewed labs and note and stated that patient's TSH level is better now than in February and to continue current dose of Levothyroxine for now and keep Tuesday's appointment. I called patient and told him and he agreed.

## 2013-02-01 NOTE — Telephone Encounter (Signed)
Patient called and stated that he needed an appointment with Dr. Glade Lloyd to follow up diabetes and thyroid. Patient stated that he saw Dr. Everardo All for his Diabetes yesterday and he did labwork and his A1C came back fine but his thyroid came back at 14.66 abnormal. Stated he was taking Levothyroxine daily. Also stated that he took 3 tablets of his Levothyroxine this morning totaling . I asked him who told him to do this and he said he just did it on his own. Told patient he could not do this because it could throw him into a thyroid crises. Patient stated he didn't know what else to do. I scheduled him a follow up appointment with Dr. Glade Lloyd on 02/08/2012 and printed out labs for Dr. Glade Lloyd to review. Patient got upset and started crying stating he was going to die. I reassured patient that we were going to take care of him and his medications and I would give Dr. Glade Lloyd the results and call him back. He agreed. (Printed this message and gave to Dr. Glade Lloyd along with the labs from Dr. George Hugh office)  01/31/2013  10/04/2012  07/06/2012   04/20/2012   01/10/2012 TSH- 14.66  T4- 0.7   TSH- 56.340   TSH: 0.085   TSH: 68.969    TSH- 115.0

## 2013-02-02 ENCOUNTER — Other Ambulatory Visit: Payer: Self-pay | Admitting: *Deleted

## 2013-02-02 MED ORDER — LEVOTHYROXINE SODIUM 100 MCG PO TABS: ORAL_TABLET | ORAL | Status: DC

## 2013-02-06 ENCOUNTER — Encounter: Payer: Self-pay | Admitting: *Deleted

## 2013-02-07 ENCOUNTER — Ambulatory Visit (INDEPENDENT_AMBULATORY_CARE_PROVIDER_SITE_OTHER): Payer: Medicare Other | Admitting: Internal Medicine

## 2013-02-07 ENCOUNTER — Encounter: Payer: Self-pay | Admitting: Internal Medicine

## 2013-02-07 VITALS — BP 118/72 | HR 58 | Temp 98.0°F | Resp 14 | Ht 65.0 in | Wt 158.8 lb

## 2013-02-07 DIAGNOSIS — I25810 Atherosclerosis of coronary artery bypass graft(s) without angina pectoris: Secondary | ICD-10-CM

## 2013-02-07 DIAGNOSIS — E039 Hypothyroidism, unspecified: Secondary | ICD-10-CM

## 2013-02-07 DIAGNOSIS — I10 Essential (primary) hypertension: Secondary | ICD-10-CM

## 2013-02-07 DIAGNOSIS — E109 Type 1 diabetes mellitus without complications: Secondary | ICD-10-CM

## 2013-02-07 NOTE — Progress Notes (Signed)
Subjective:    Patient ID: Reginald Gutierrez, male    DOB: 11-Dec-1965, 47 y.o.   MRN: [id number]  Chief Complaint  Patient presents with  . Medical Managment of Chronic Issues    HPI  47 y/o male patient with hx of uncontrolled brittle DM, HTN, CAD (3 vessel disease and moderate LV dysfunction) is here for routine viist. He is here with his mother. He is not participating much in conversation during the initial part of visit. Checked his blood sugar in office and was 25. It improved after glucose administration. He mentions his home cbg is running between 20 to 500. He is taking 40 u lantus (35 u prescribed) and 5-10 u of novolin (1 u prescribed). He is followed by endocrinology. Reviewed recent a1c of 7.2 His mother mentions about patient skipping meals and also his meds at times. He awaits his ventral hernia repair  Review of Systems  Constitutional: Negative for fever, chills, diaphoresis and appetite change.  HENT: Negative for congestion, mouth sores and neck pain.   Eyes: Negative for visual disturbance.  Respiratory: Negative for cough, chest tightness and shortness of breath.   Cardiovascular: Negative for chest pain, palpitations and leg swelling.  Gastrointestinal: Negative for abdominal pain and constipation.  Genitourinary: Negative for dysuria, frequency and flank pain.  Musculoskeletal: Negative for back pain and arthralgias.  Neurological: Negative for dizziness, syncope, weakness, light-headedness and headaches.  Hematological: Negative for adenopathy.  Psychiatric/Behavioral: Positive for agitation. Negative for sleep disturbance. The patient is nervous/anxious.        Objective:   Physical Exam  Constitutional: He is oriented to person, place, and time. No distress.  HENT:  Head: Normocephalic and atraumatic.  Mouth/Throat: Oropharynx is clear and moist.  Eyes: Conjunctivae and EOM are normal. Pupils are equal, round, and reactive to light.  Neck: Normal range of  motion. Neck supple. No JVD present. No thyromegaly present.  Cardiovascular: Normal rate, regular rhythm and normal heart sounds.   No murmur heard. Pulmonary/Chest: Effort normal and breath sounds normal. No respiratory distress. He has no wheezes.  Abdominal: Soft. Bowel sounds are normal. He exhibits no distension. There is no tenderness.  Musculoskeletal: Normal range of motion. He exhibits no edema and no tenderness.  Lymphadenopathy:    He has no cervical adenopathy.  Neurological: He is alert and oriented to person, place, and time. He has normal reflexes.  Skin: Skin is warm and dry. He is not diaphoretic.  Psychiatric:  Gets agitated easily, rude during conversation     BP 118/72  Pulse 58  Temp(Src) 98 F (36.7 C) (Oral)  Resp 14  Ht 5\' 5"  (1.651 m)  Wt 158 lb 12.8 oz (72.031 kg)  BMI 26.43 kg/m2     Assessment & Plan:   Brittle diabetes- will him being in confused state in office, checked his blood glucose and was 25. He was given glucose tube, peanut butter cracker, juice and on recheck it was 41 and then 99. He became more alert following this. Talked in detail about diabetic education. I talked with Dr Everardo All (his endocrinologist) today over the phone and further reinforced him to take his diabetic medication dose only as prescribed. Explained problems with hypoglycemia. To take lantus 35 u only and novolin 1 u for cbg > 300 for now. Have set up appointment with Dr Everardo All in a week.   hypothyroidisim- pt non compliant with his medications. tsh improved on recent exam compared to few months bacl 14.66 from 115.  Continue current regimen of levothyroxine. Education provided on administration of levothyroxine  htn- bp well controlled. No medication changes made. Continue current regimen  CAD- followed by cardiology and pending CABG. Pt to decide on the procedure. Currently continue medical management with asa, statin, b blokcer and ACEI  Anxiety- persists, continue  current dose of ativan  Anemia- continue iron supplement for now  All the above was discussed with patient and his mother in presence of office manager.  Spent more than 30 minutes co-ordinating his care this visit

## 2013-02-07 NOTE — Patient Instructions (Addendum)
Please take 35 u lantus subcutaneously and novolog 1 u for blood sugar greater than 300 until you see Dr Everardo All next week. Please take your thyroid medication as prescribed at 100 mcg daily for now.   Please take your glucometer with you to your endocrine appointment

## 2013-02-09 ENCOUNTER — Ambulatory Visit: Payer: Medicare Other | Admitting: Endocrinology

## 2013-02-22 ENCOUNTER — Telehealth: Payer: Self-pay | Admitting: Endocrinology

## 2013-02-22 ENCOUNTER — Encounter: Payer: Self-pay | Admitting: Endocrinology

## 2013-02-22 ENCOUNTER — Ambulatory Visit (INDEPENDENT_AMBULATORY_CARE_PROVIDER_SITE_OTHER): Payer: Medicare Other | Admitting: Endocrinology

## 2013-02-22 VITALS — BP 126/80 | HR 90 | Ht 68.0 in | Wt 155.0 lb

## 2013-02-22 DIAGNOSIS — E109 Type 1 diabetes mellitus without complications: Secondary | ICD-10-CM

## 2013-02-22 NOTE — Progress Notes (Signed)
Subjective:    Patient ID: Reginald Gutierrez, male    DOB: 1966/02/26, 47 y.o.   MRN: [id number]  HPI Pt returns for f/u of type 1 DM (dx'ed 1969; he has mild if any neuropathy of the lower extremities; he has associated renal failure, retinopathy, PAD, and CAD (refused CABG); he has required a simple qd insulin regimen).  In late 2013, he had a episode of severe hypoglycemia.  no cbg record, but he states cbg's are mildly low approx once a month.  This happens with activity, getting upset, or with missing a meal.  He still says he'll reconsider having the CABG.   He brings a dmv form.  i did the part about his DM.  However, he needs to have his heart disease and anxiety addressed also. Past Medical History  Diagnosis Date  . Diabetes mellitus type 1   . Hyperlipidemia   . Depression   . Umbilical hernia   . Hypertension   . Glaucoma   . CHF (congestive heart failure)   . Heart attack 12/16/11  . Hypothyroidism   . CAD (coronary artery disease) of artery bypass graft 07/19/2012  . Hypoglycemia, unspecified   . Disorder of bone and cartilage, unspecified   . Acute posthemorrhagic anemia   . Anxiety state, unspecified   . Chronic pain syndrome   . Other primary cardiomyopathies   . Acute respiratory failure   . Dysphagia, oral phase   . Stress fracture of tibia or fibula   . Syncope and collapse     Past Surgical History  Procedure Laterality Date  . Appendectomy      open  . Tibia im nail insertion  12/18/2011    Procedure: INTRAMEDULLARY (IM) NAIL TIBIAL;  Surgeon: Eugenia Mcalpine, MD;  Location: [facility];  Service: Orthopedics;  Laterality: Bilateral;  . Insertion of dialysis catheter  12/31/2011    Procedure: INSERTION OF DIALYSIS CATHETER;  Surgeon: Chuck Hint, MD;  Location: [facility] OR;  Service: Vascular;  Laterality: N/A;  . Tonsillectomy  1980  . Fracture surgery  2012    wrist  . Fracture surgery  2013    bilateral tibia    History   Social History  . Marital  Status: Single    Spouse Name: N/A    Number of Children: 0  . Years of Education: 16   Occupational History  . Sales- disabled    Social History Main Topics  . Smoking status: Current Every Day Smoker -- 1.00 packs/day    Types: Cigarettes  . Smokeless tobacco: Never Used  . Alcohol Use: No  . Drug Use: No  . Sexually Active: No   Other Topics Concern  . Not on file   Social History Narrative  . No narrative on file    Current Outpatient Prescriptions on File Prior to Visit  Medication Sig Dispense Refill  . aspirin 81 MG tablet Take 81 mg by mouth daily.      Marland Kitchen atorvastatin (LIPITOR) 40 MG tablet Take 40 mg by mouth daily.      . ferrous sulfate 325 (65 FE) MG tablet Take 1 tablet (325 mg total) by mouth daily with breakfast.  30 tablet  11  . glucagon (GLUCAGEN) 1 MG SOLR Inject 1 mg into the muscle once as needed.  1 each  3  . GLUCAGON EMERGENCY 1 MG injection Use as Directed for low blood sugar      . HYDROcodone-acetaminophen (NORCO) 10-325 MG per tablet       .  insulin glargine (LANTUS) 100 UNIT/ML injection Inject 35 Units into the skin daily.       . insulin regular (NOVOLIN R,HUMULIN R) 100 units/mL injection Inject 1 Units into the skin as directed. For any cbg over 300      . Insulin Syringe-Needle U-100 31G X 5/16" 0.5 ML MISC 1 Syringe by Does not apply route daily.  30 each  11  . levothyroxine (SYNTHROID, LEVOTHROID) 100 MCG tablet Take one tablet once daily for thyroid  90 tablet  3  . lisinopril (PRINIVIL,ZESTRIL) 40 MG tablet Take 20 mg by mouth daily.       Marland Kitchen LORazepam (ATIVAN) 2 MG tablet Take 2 mg by mouth as needed. For anxiety      . metoprolol tartrate (LOPRESSOR) 25 MG tablet Take 12.5 mg by mouth 2 (two) times daily.       No current facility-administered medications on file prior to visit.    Allergies  Allergen Reactions  . Vancomycin     No family history on file.  BP 126/80  Pulse 90  Ht 5\' 8"  (1.727 m)  Wt 155 lb (70.308 kg)  BMI  23.57 kg/m2  SpO2 97%  Review of Systems denies LOC and weight change    Objective:   Physical Exam VITAL SIGNS:  See vs page GENERAL: no distress SKIN:  Insulin injection sites at the anterior abdomen are normal. Psych: tearful.    Lab Results  Component Value Date   HGBA1C 7.2* 01/31/2013      Assessment & Plan:  DM: This insulin regimen was chosen from multiple options, for its simplicity.  The benefits of glycemic control must be weighed against the risks of hypoglycemia.   i told pt i can only do the DM part of his DMV form.  This is because he has several med probs and meds which need to be addressed.

## 2013-02-22 NOTE — Telephone Encounter (Signed)
please call patient: i can only fill out the part of the form that pertains to diabetes.   If the DMV wants the rest of it, that has to come from your PCP.

## 2013-02-22 NOTE — Patient Instructions (Addendum)
check your blood sugar 6 times a day--before the 3 meals, and at bedtime.  also check if you have symptoms of your blood sugar being too high or too low.  please keep a record of the readings and bring it to your next appointment here.  please call us sooner if your blood sugar goes below 70, or if it stays over 200. Please come back for a follow-up appointment in 2-3 months.   A diabetes blood test is requested for you today.  We'll contact you with results.

## 2013-02-22 NOTE — Telephone Encounter (Signed)
Pt advised and states he would like the form mailed back to him

## 2013-03-13 ENCOUNTER — Encounter: Payer: Self-pay | Admitting: *Deleted

## 2013-03-14 ENCOUNTER — Ambulatory Visit (INDEPENDENT_AMBULATORY_CARE_PROVIDER_SITE_OTHER): Payer: Medicare Other | Admitting: Internal Medicine

## 2013-03-14 ENCOUNTER — Encounter: Payer: Self-pay | Admitting: Internal Medicine

## 2013-03-14 VITALS — BP 120/62 | HR 64 | Temp 98.2°F | Resp 13 | Ht 68.0 in | Wt 161.0 lb

## 2013-03-14 DIAGNOSIS — I10 Essential (primary) hypertension: Secondary | ICD-10-CM

## 2013-03-14 DIAGNOSIS — F17200 Nicotine dependence, unspecified, uncomplicated: Secondary | ICD-10-CM

## 2013-03-14 DIAGNOSIS — L02219 Cutaneous abscess of trunk, unspecified: Secondary | ICD-10-CM

## 2013-03-14 DIAGNOSIS — I25810 Atherosclerosis of coronary artery bypass graft(s) without angina pectoris: Secondary | ICD-10-CM

## 2013-03-14 DIAGNOSIS — E109 Type 1 diabetes mellitus without complications: Secondary | ICD-10-CM

## 2013-03-14 DIAGNOSIS — L03314 Cellulitis of groin: Secondary | ICD-10-CM

## 2013-03-14 DIAGNOSIS — E039 Hypothyroidism, unspecified: Secondary | ICD-10-CM

## 2013-03-14 MED ORDER — CEPHALEXIN 500 MG PO CAPS: 500.0000 mg | ORAL_CAPSULE | Freq: Four times a day (QID) | ORAL | Status: DC

## 2013-03-14 MED ORDER — NYSTATIN POWD: 1.0000 "application " | Freq: Two times a day (BID) | Status: DC

## 2013-03-14 NOTE — Progress Notes (Signed)
Patient ID: Reginald Gutierrez, male   DOB: Dec 15, 1965, 47 y.o.   MRN: [id number]  Chief Complaint  Patient presents with  . Medical Managment of Chronic Issues   HPI- Patient with hx of CAD,ncontrolled brittle DM, hypothyroidism is here for routine follow up. He mentions his sugars have been fluctuating but with lesser low glucose reading for now. He has been complaint this visit about his diabetic medication.  He is still contemplating thoughts about undergoing CABG He has been taking his thyroid meds as prescribed. He has noticed lesions in his private area with discomfort for a week. He has not been sexually active recently. Denies any dysuria, penile discharge He has been applying baby oil to his groin area and also shaved in that area recently He awaits his ventral hernia repair He wants DMV form filled out  Review of Systems  Constitutional: Negative for fever, chills, diaphoresis and appetite change.  HENT: Negative for congestion, mouth sores and neck pain.   Eyes: Negative for visual disturbance.  Respiratory: Negative for cough, chest tightness and shortness of breath.   Cardiovascular: Negative for chest pain, palpitations and leg swelling.  Gastrointestinal: Negative for abdominal pain and constipation.  Genitourinary: Negative for dysuria, frequency and flank pain.  Musculoskeletal: Negative for back pain and arthralgias.  Neurological: Negative for dizziness, syncope, weakness, light-headedness and headaches.  Hematological: Negative for adenopathy.  Psychiatric/Behavioral: Positive for agitation. Negative for sleep disturbance. The patient is nervous/anxious.    BP 120/62  Pulse 64  Temp(Src) 98.2 F (36.8 C) (Oral)  Resp 13  Ht 5\' 8"  (1.727 m)  Wt 161 lb (73.029 kg)  BMI 24.49 kg/m2  Constitutional: He is oriented to person, place, and time. No distress.  HENT:   Head: Normocephalic and atraumatic.   Mouth/Throat: Oropharynx is clear and moist.  Eyes: Conjunctivae  and EOM are normal. Pupils are equal, round, and reactive to light.  Neck: Normal range of motion. Neck supple. No JVD present. No thyromegaly present.  Cardiovascular: Normal rate, regular rhythm and normal heart sounds.    No murmur heard. Pulmonary/Chest: Effort normal and breath sounds normal. No respiratory distress. He has no wheezes.  Abdominal: Soft. Bowel sounds are normal. He exhibits no distension. There is no tenderness.  Musculoskeletal: Normal range of motion. He exhibits no edema and no tenderness.  Lymphadenopathy:    He has no cervical adenopathy.  Neurological: He is alert and oriented to person, place, and time. He has normal reflexes.  Groin- has macules and scabbed areas with erythema in the groin, at shaft of penis and scrotal area. No enlarged inguinal lymph node Skin: Skin is warm and dry elsewhere Psychiatric:  Gets agitated easily, rude during conversation. He wants his DMV form filled but does not want me to mentions him having cardiac or vision problem  Labs-  CBC    Component Value Date/Time   WBC 8.7 07/19/2012 1309   RBC 3.93* 07/19/2012 1309   HGB 12.0* 07/19/2012 1309   HCT 36.3* 07/19/2012 1309   PLT 332.0 07/19/2012 1309   MCV 92.3 07/19/2012 1309   MCH 27.6 02/01/2012 0536   MCHC 33.2 07/19/2012 1309   RDW 15.9* 07/19/2012 1309   LYMPHSABS 2.0 07/19/2012 1309   MONOABS 0.4 07/19/2012 1309   EOSABS 0.2 07/19/2012 1309   BASOSABS 0.0 07/19/2012 1309    CMP     Component Value Date/Time   NA 136 07/19/2012 1309   K 4.6 07/19/2012 1309   CL 100 07/19/2012  1309   CO2 29 07/19/2012 1309   GLUCOSE 159* 07/19/2012 1309   BUN 16 07/19/2012 1309   CREATININE 1.0 07/19/2012 1309   CALCIUM 9.5 07/19/2012 1309   CALCIUM 8.6 01/11/2012 0418   PROT 6.2 01/08/2012 0450   ALBUMIN 2.6* 01/11/2012 0418   AST 31 01/08/2012 0450   ALT 24 01/08/2012 0450   ALKPHOS 113 01/08/2012 0450   BILITOT 0.4 01/08/2012 0450   GFRNONAA 87* 02/01/2012 0536   GFRAA >90  02/01/2012 0536   ASSESSMENT/PLAN  Cellulitis- will have him take keflex 500 mg qid for 5 days and reassess if no improvement. Avoid shaving and applying baby oil to the area. To use nystatin powder instead after cleaning the area   htn- bp well controlled. No medication changes made. Continue current regimen  CAD- followed by cardiology and pending CABG. Remains chest pain free.Pt to decide on the procedure. Currently continue medical management with asa, statin, b blokcer and ACEI  Brittle diabetes- better than before. Continue current regimen, follow with Dr Everardo All. To take lantus 35 u only and novolin 1 u for cbg > 300 for now.  hypothyroidisim- Continue current regimen of levothyroxine. Recheck tsh prior to next visit  DMV form filled out

## 2013-03-15 LAB — COMPREHENSIVE METABOLIC PANEL
AST: 12 IU/L (ref 0–40)
Albumin: 4.6 g/dL (ref 3.5–5.5)
Alkaline Phosphatase: 68 IU/L (ref 39–117)
BUN/Creatinine Ratio: 28 — ABNORMAL HIGH (ref 9–20)
BUN: 33 mg/dL — ABNORMAL HIGH (ref 6–24)
CO2: 24 mmol/L (ref 18–29)
Chloride: 99 mmol/L (ref 97–108)
GFR calc Af Amer: 84 mL/min/{1.73_m2} (ref >59)
Globulin, Total: 2.4 g/dL (ref 1.5–4.5)
Sodium: 139 mmol/L (ref 134–144)
Total Bilirubin: 0.4 mg/dL (ref 0.0–1.2)

## 2013-03-15 LAB — CBC WITH DIFFERENTIAL/PLATELET
Eosinophils Absolute: 0.1 10*3/uL (ref 0.0–0.4)
Immature Grans (Abs): 0 10*3/uL (ref 0.0–0.1)
Immature Granulocytes: 0 % (ref 0–2)
Lymphs: 21 % (ref 14–46)
MCH: 30.9 pg (ref 26.6–33.0)
MCV: 95 fL (ref 79–97)
Monocytes Absolute: 0.7 10*3/uL (ref 0.1–0.9)
Neutrophils Relative %: 70 % (ref 40–74)
RDW: 14.6 % (ref 12.3–15.4)
WBC: 9.1 10*3/uL (ref 3.4–10.8)

## 2013-05-04 ENCOUNTER — Other Ambulatory Visit: Payer: Self-pay | Admitting: *Deleted

## 2013-05-04 ENCOUNTER — Other Ambulatory Visit (HOSPITAL_BASED_OUTPATIENT_CLINIC_OR_DEPARTMENT_OTHER): Payer: Self-pay | Admitting: Internal Medicine

## 2013-05-04 ENCOUNTER — Other Ambulatory Visit: Payer: Self-pay | Admitting: Endocrinology

## 2013-05-04 MED ORDER — GLUCOSE BLOOD VI STRP: ORAL_STRIP | Status: AC

## 2013-05-04 MED ORDER — LORAZEPAM 2 MG PO TABS: ORAL_TABLET | ORAL | Status: DC

## 2013-05-04 MED ORDER — INSULIN GLARGINE 100 UNIT/ML ~~LOC~~ SOLN: 35.0000 [IU] | Freq: Every day | SUBCUTANEOUS | Status: DC

## 2013-05-05 ENCOUNTER — Other Ambulatory Visit: Payer: Self-pay | Admitting: *Deleted

## 2013-05-05 MED ORDER — LORAZEPAM 2 MG PO TABS: ORAL_TABLET | ORAL | Status: DC

## 2013-05-18 ENCOUNTER — Ambulatory Visit (INDEPENDENT_AMBULATORY_CARE_PROVIDER_SITE_OTHER): Payer: Medicare Other | Admitting: Endocrinology

## 2013-05-18 ENCOUNTER — Encounter: Payer: Self-pay | Admitting: Endocrinology

## 2013-05-18 VITALS — BP 126/78 | HR 71 | Ht 66.0 in | Wt 160.0 lb

## 2013-05-18 DIAGNOSIS — E109 Type 1 diabetes mellitus without complications: Secondary | ICD-10-CM

## 2013-05-18 DIAGNOSIS — E1029 Type 1 diabetes mellitus with other diabetic kidney complication: Secondary | ICD-10-CM

## 2013-05-18 LAB — MICROALBUMIN / CREATININE URINE RATIO: Microalb, Ur: 2.9 mg/dL — ABNORMAL HIGH (ref 0.0–1.9)

## 2013-05-18 MED ORDER — GLUCAGON (RDNA) 1 MG IJ KIT: 1.0000 mg | PACK | Freq: Once | INTRAMUSCULAR | Status: DC | PRN

## 2013-05-18 MED ORDER — "INSULIN SYRINGE 29G X 1/2"" 0.5 ML MISC": 1.0000 | Freq: Every day | Status: AC

## 2013-05-18 NOTE — Progress Notes (Signed)
Subjective:    Patient ID: Reginald Gutierrez, male    DOB: 03/04/1966, 47 y.o.   MRN: [id number]  HPI Pt returns for f/u of type 1 DM (dx'ed 1969; he has mild if any neuropathy of the lower extremities; he has associated renal failure, retinopathy, PAD, and CAD (refused CABG); he has required a simple qd insulin regimen; in late 2013, he had a episode of severe hypoglycemia).  He has quit smoking.  He has mild hypoglycemia approx 1-2 times a week.  This happens in the afternoon, after he misses a meal.   Past Medical History  Diagnosis Date  . Diabetes mellitus type 1   . Hyperlipidemia   . Depression   . Umbilical hernia   . Hypertension   . Glaucoma   . CHF (congestive heart failure)   . Heart attack 12/16/11  . Hypothyroidism   . CAD (coronary artery disease) of artery bypass graft 07/19/2012  . Hypoglycemia, unspecified   . Disorder of bone and cartilage, unspecified   . Acute posthemorrhagic anemia   . Anxiety state, unspecified   . Chronic pain syndrome   . Other primary cardiomyopathies   . Acute respiratory failure   . Dysphagia, oral phase   . Stress fracture of tibia or fibula   . Syncope and collapse     Past Surgical History  Procedure Laterality Date  . Appendectomy      open  . Tibia im nail insertion  12/18/2011    Procedure: INTRAMEDULLARY (IM) NAIL TIBIAL;  Surgeon: Eugenia Mcalpine, MD;  Location: [facility];  Service: Orthopedics;  Laterality: Bilateral;  . Insertion of dialysis catheter  12/31/2011    Procedure: INSERTION OF DIALYSIS CATHETER;  Surgeon: Chuck Hint, MD;  Location: [facility] OR;  Service: Vascular;  Laterality: N/A;  . Tonsillectomy  1980  . Fracture surgery  2012    wrist  . Fracture surgery  2013    bilateral tibia    History   Social History  . Marital Status: Single    Spouse Name: N/A    Number of Children: 0  . Years of Education: 16   Occupational History  . Sales- disabled    Social History Main Topics  . Smoking status: Current  Every Day Smoker -- 1.00 packs/day    Types: Cigarettes  . Smokeless tobacco: Never Used  . Alcohol Use: No  . Drug Use: No  . Sexual Activity: No   Other Topics Concern  . Not on file   Social History Narrative  . No narrative on file    Current Outpatient Prescriptions on File Prior to Visit  Medication Sig Dispense Refill  . ACCU-CHEK AVIVA PLUS test strip TEST 6 TIMES A DAY AS DIRECTED.  150 each  12  . aspirin 81 MG tablet Take 81 mg by mouth daily.      Marland Kitchen atorvastatin (LIPITOR) 40 MG tablet Take 40 mg by mouth daily.      Marland Kitchen glucose blood test strip TEST BLOOD SUGAR 6 TIMES A DAY AS INSTRUCTED BY DR Enjoli Tidd  200 each  10  . HYDROcodone-acetaminophen (NORCO) 10-325 MG per tablet       . insulin regular (NOVOLIN R,HUMULIN R) 100 units/mL injection Inject 1 Units into the skin as directed. For any cbg over 300      . LANTUS 100 UNIT/ML injection INJECT 85 UNITS INTO THE SKIN DAILY.  30 mL  11  . levothyroxine (SYNTHROID, LEVOTHROID) 100 MCG tablet Take one tablet  once daily for thyroid  90 tablet  3  . lisinopril (PRINIVIL,ZESTRIL) 40 MG tablet Take 20 mg by mouth daily.       Marland Kitchen LORazepam (ATIVAN) 2 MG tablet Take one tablet by mouth every 8 hours if needed for anxiety  90 tablet  0  . metoprolol tartrate (LOPRESSOR) 25 MG tablet Take 12.5 mg by mouth 2 (two) times daily.      Marland Kitchen Nystatin POWD 1 application by Does not apply route 2 (two) times daily.  1 Bottle  0  . ferrous sulfate 325 (65 FE) MG tablet Take 1 tablet (325 mg total) by mouth daily with breakfast.  30 tablet  11   No current facility-administered medications on file prior to visit.   Allergies  Allergen Reactions  . Vancomycin    No family history on file.  BP 126/78  Pulse 71  Ht 5\' 6"  (1.676 m)  Wt 160 lb (72.576 kg)  BMI 25.84 kg/m2  SpO2 96%  Review of Systems Denies LOC and weight change    Objective:   Physical Exam VITAL SIGNS:  See vs page GENERAL: no distress  Lab Results  Component  Value Date   HGBA1C 6.5 05/18/2013      Assessment & Plan:  DM: overcontrolled.  This insulin regimen was chosen from multiple options, for its simplicity.  The benefits of glycemic control must be weighed against the risks of hypoglycemia.   CAD: in this setting, he needs to avoid hypoglycemia.   Depression: this complicates the rx of DM.

## 2013-05-18 NOTE — Patient Instructions (Addendum)
check your blood sugar 6 times a day--before the 3 meals, and at bedtime.  also check if you have symptoms of your blood sugar being too high or too low.  please keep a record of the readings and bring it to your next appointment here.  please call us sooner if your blood sugar goes below 70, or if it stays over 200. Please come back for a follow-up appointment in 4 months.   A diabetes blood test is requested for you today.  We'll contact you with results.   On this type of insulin schedule, you should eat meals on a regular schedule.  If a meal is missed or significantly delayed, your blood sugar could go low. Please reduce the lantus to 30 units each morning.

## 2013-05-30 ENCOUNTER — Other Ambulatory Visit: Payer: Self-pay | Admitting: Internal Medicine

## 2013-06-04 ENCOUNTER — Other Ambulatory Visit: Payer: Self-pay | Admitting: Internal Medicine

## 2013-07-06 ENCOUNTER — Other Ambulatory Visit: Payer: Self-pay

## 2013-07-10 ENCOUNTER — Other Ambulatory Visit: Payer: Self-pay | Admitting: Endocrinology

## 2013-07-11 ENCOUNTER — Other Ambulatory Visit: Payer: Self-pay | Admitting: *Deleted

## 2013-07-11 MED ORDER — LISINOPRIL 40 MG PO TABS: 20.0000 mg | ORAL_TABLET | Freq: Every day | ORAL | Status: DC

## 2013-07-12 ENCOUNTER — Other Ambulatory Visit: Payer: Self-pay | Admitting: Pain Medicine

## 2013-07-12 DIAGNOSIS — M25512 Pain in left shoulder: Secondary | ICD-10-CM

## 2013-07-24 ENCOUNTER — Ambulatory Visit
Admission: RE | Admit: 2013-07-24 | Discharge: 2013-07-24 | Disposition: A | Discharge status: Home / Self Care | Payer: Medicaid Other | Source: Ambulatory Visit | Attending: Pain Medicine | Admitting: Pain Medicine

## 2013-07-24 DIAGNOSIS — M25512 Pain in left shoulder: Secondary | ICD-10-CM

## 2013-08-04 ENCOUNTER — Other Ambulatory Visit: Payer: Self-pay | Admitting: Internal Medicine

## 2013-08-14 ENCOUNTER — Encounter: Payer: Self-pay | Admitting: *Deleted

## 2013-08-15 ENCOUNTER — Encounter (HOSPITAL_COMMUNITY): Payer: Self-pay | Admitting: Emergency Medicine

## 2013-08-15 ENCOUNTER — Ambulatory Visit (INDEPENDENT_AMBULATORY_CARE_PROVIDER_SITE_OTHER): Payer: Medicare Other | Admitting: Internal Medicine

## 2013-08-15 ENCOUNTER — Encounter: Payer: Self-pay | Admitting: Internal Medicine

## 2013-08-15 ENCOUNTER — Emergency Department (HOSPITAL_COMMUNITY)
Admission: EM | Admit: 2013-08-15 | Discharge: 2013-08-15 | Disposition: A | Discharge status: Home / Self Care | Payer: Medicare Other | Attending: Emergency Medicine | Admitting: Emergency Medicine

## 2013-08-15 VITALS — BP 120/64 | HR 68 | Temp 98.1°F | Resp 16 | Wt 168.8 lb

## 2013-08-15 DIAGNOSIS — E785 Hyperlipidemia, unspecified: Secondary | ICD-10-CM

## 2013-08-15 DIAGNOSIS — E1029 Type 1 diabetes mellitus with other diabetic kidney complication: Secondary | ICD-10-CM

## 2013-08-15 DIAGNOSIS — E039 Hypothyroidism, unspecified: Secondary | ICD-10-CM | POA: Insufficient documentation

## 2013-08-15 DIAGNOSIS — E162 Hypoglycemia, unspecified: Secondary | ICD-10-CM

## 2013-08-15 DIAGNOSIS — R569 Unspecified convulsions: Secondary | ICD-10-CM

## 2013-08-15 DIAGNOSIS — Z8719 Personal history of other diseases of the digestive system: Secondary | ICD-10-CM | POA: Insufficient documentation

## 2013-08-15 DIAGNOSIS — I10 Essential (primary) hypertension: Secondary | ICD-10-CM | POA: Insufficient documentation

## 2013-08-15 DIAGNOSIS — E1169 Type 2 diabetes mellitus with other specified complication: Secondary | ICD-10-CM

## 2013-08-15 DIAGNOSIS — F17200 Nicotine dependence, unspecified, uncomplicated: Secondary | ICD-10-CM | POA: Insufficient documentation

## 2013-08-15 DIAGNOSIS — I2510 Atherosclerotic heart disease of native coronary artery without angina pectoris: Secondary | ICD-10-CM | POA: Insufficient documentation

## 2013-08-15 DIAGNOSIS — G8929 Other chronic pain: Secondary | ICD-10-CM | POA: Insufficient documentation

## 2013-08-15 DIAGNOSIS — Z794 Long term (current) use of insulin: Secondary | ICD-10-CM | POA: Insufficient documentation

## 2013-08-15 DIAGNOSIS — Z8781 Personal history of (healed) traumatic fracture: Secondary | ICD-10-CM | POA: Insufficient documentation

## 2013-08-15 DIAGNOSIS — Z8739 Personal history of other diseases of the musculoskeletal system and connective tissue: Secondary | ICD-10-CM | POA: Insufficient documentation

## 2013-08-15 DIAGNOSIS — G40909 Epilepsy, unspecified, not intractable, without status epilepticus: Secondary | ICD-10-CM | POA: Insufficient documentation

## 2013-08-15 DIAGNOSIS — Z7982 Long term (current) use of aspirin: Secondary | ICD-10-CM | POA: Insufficient documentation

## 2013-08-15 DIAGNOSIS — I509 Heart failure, unspecified: Secondary | ICD-10-CM | POA: Insufficient documentation

## 2013-08-15 DIAGNOSIS — F329 Major depressive disorder, single episode, unspecified: Secondary | ICD-10-CM | POA: Insufficient documentation

## 2013-08-15 DIAGNOSIS — F3289 Other specified depressive episodes: Secondary | ICD-10-CM | POA: Insufficient documentation

## 2013-08-15 DIAGNOSIS — Z862 Personal history of diseases of the blood and blood-forming organs and certain disorders involving the immune mechanism: Secondary | ICD-10-CM | POA: Insufficient documentation

## 2013-08-15 DIAGNOSIS — Z79899 Other long term (current) drug therapy: Secondary | ICD-10-CM | POA: Insufficient documentation

## 2013-08-15 DIAGNOSIS — E1069 Type 1 diabetes mellitus with other specified complication: Secondary | ICD-10-CM | POA: Insufficient documentation

## 2013-08-15 DIAGNOSIS — F411 Generalized anxiety disorder: Secondary | ICD-10-CM | POA: Insufficient documentation

## 2013-08-15 DIAGNOSIS — E11649 Type 2 diabetes mellitus with hypoglycemia without coma: Secondary | ICD-10-CM

## 2013-08-15 DIAGNOSIS — Z951 Presence of aortocoronary bypass graft: Secondary | ICD-10-CM | POA: Insufficient documentation

## 2013-08-15 DIAGNOSIS — Z8709 Personal history of other diseases of the respiratory system: Secondary | ICD-10-CM | POA: Insufficient documentation

## 2013-08-15 LAB — GLUCOSE, CAPILLARY: Glucose-Capillary: 33 mg/dL — CL (ref 70–99)

## 2013-08-15 MED ORDER — LORAZEPAM 2 MG PO TABS: ORAL_TABLET | ORAL | Status: DC

## 2013-08-15 MED ORDER — ATORVASTATIN CALCIUM 40 MG PO TABS: 40.0000 mg | ORAL_TABLET | Freq: Every day | ORAL | Status: DC

## 2013-08-15 MED ORDER — GLUCAGON (RDNA) 1 MG IJ KIT: 1.0000 mg | PACK | Freq: Once | INTRAMUSCULAR | Status: DC | PRN

## 2013-08-15 MED ORDER — LISINOPRIL 40 MG PO TABS: 20.0000 mg | ORAL_TABLET | Freq: Every day | ORAL | Status: DC

## 2013-08-15 MED ORDER — METOPROLOL TARTRATE 25 MG PO TABS: 12.5000 mg | ORAL_TABLET | Freq: Two times a day (BID) | ORAL | Status: DC

## 2013-08-15 NOTE — ED Notes (Signed)
Pt. Is alert and oriented X4. Pt. Refuses IV.  Will monitor.  Pt. Given a meal and orange juice.

## 2013-08-15 NOTE — ED Notes (Signed)
Rechecked CBG 105.  Pt. oob gait steady no s/s of hypoglycemia.

## 2013-08-15 NOTE — ED Notes (Signed)
Pt. Spoke with Doctor.  Pt. Is ready to be discharged.  Pt. Alert and oriented X4.

## 2013-08-15 NOTE — ED Notes (Signed)
Checked patient blood sugar it was 33 notifed brittany of blood sugar

## 2013-08-15 NOTE — Progress Notes (Signed)
Patient ID: Reginald Gutierrez, male   DOB: 1966-06-15, 47 y.o.   MRN: [id number]  Chief Complaint  Patient presents with  . Medical Managment of Chronic Issues    f/u and medication refills  . Immunizations    declines Tdap & Pneumo    Allergies  Allergen Reactions  . Vancomycin     HPI 47 y/o male pt here for routine follow up cbg has been mostly between 200-300 at home with few readings in 40-60 as per pt Taking lantus 30 u Taking novolog for cbg > 200 1 u 7 day average of cbg 186 He was doing fine until he went for blood work for routine labs. In the lab room, he was noted to have jerky movements of his arm and had altered mental status. He was made to lie down and his sugar was checked. It was unrecordable in our machine. He was given 2 glucose tubes and 1 glucose tablet with some fruit juice and his sugar came up to 22. He still has some confusion and his pupil is fixed dilated. No further jerking movements. No bowel/ bladder incontinence. No trauma witnessed He is diaphoretic at present  Review of Systems  Constitutional: Negative for fever, chills, weight loss HENT: Negative for congestion, hearing loss and sore throat.   Eyes: Negative for blurred vision, double vision and discharge.  Respiratory: Negative for cough, sputum production, shortness of breath and wheezing.   Cardiovascular: Negative for chest pain, palpitations, orthopnea and leg swelling.  Gastrointestinal: Negative for heartburn, nausea, vomiting, abdominal pain, diarrhea and constipation.  Genitourinary: Negative for dysuria, urgency, frequency and flank pain.  Musculoskeletal: Negative for back pain, falls and myalgias. 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 He is scheduled to be seen in pain clinic Skin: Negative for itching and rash.  Psychiatric/Behavioral: Negative for depression and memory loss. The patient is not nervous/anxious.    Past Medical History  Diagnosis  Date  . Diabetes mellitus type 1   . Hyperlipidemia   . Depression   . Umbilical hernia   . Hypertension   . Glaucoma   . CHF (congestive heart failure)   . Heart attack 12/16/11  . Hypothyroidism   . CAD (coronary artery disease) of artery bypass graft 07/19/2012  . Hypoglycemia, unspecified   . Disorder of bone and cartilage, unspecified   . Acute posthemorrhagic anemia   . Anxiety state, unspecified   . Chronic pain syndrome   . Other primary cardiomyopathies   . Acute respiratory failure   . Dysphagia, oral phase   . Stress fracture of tibia or fibula   . Syncope and collapse    Past Surgical History  Procedure Laterality Date  . Appendectomy      open  . Tibia im nail insertion  12/18/2011    Procedure: INTRAMEDULLARY (IM) NAIL TIBIAL;  Surgeon: Eugenia Mcalpine, MD;  Location: [facility];  Service: Orthopedics;  Laterality: Bilateral;  . Insertion of dialysis catheter  12/31/2011    Procedure: INSERTION OF DIALYSIS CATHETER;  Surgeon: Chuck Hint, MD;  Location: [facility] OR;  Service: Vascular;  Laterality: N/A;  . Tonsillectomy  1980  . Fracture surgery  2012    wrist  . Fracture surgery  2013    bilateral tibia   Current Outpatient Prescriptions on File Prior to Visit  Medication Sig Dispense Refill  . ACCU-CHEK AVIVA PLUS test strip TEST 6 TIMES A DAY AS DIRECTED.  150 each  12  . aspirin 81 MG tablet Take 81 mg by mouth daily.      Marland Kitchen glucose blood test strip TEST BLOOD SUGAR 6 TIMES A DAY AS INSTRUCTED BY DR ELLISON  200 each  10  . HYDROcodone-acetaminophen (NORCO) 10-325 MG per tablet       . INSULIN SYRINGE .5CC/29G 29G X 1/2" 0.5 ML MISC 1 Syringe by Does not apply route daily.  30 each  11  . LANTUS 100 UNIT/ML injection INJECT 85 UNITS INTO THE SKIN DAILY.  30 mL  11  . levothyroxine (SYNTHROID, LEVOTHROID) 100 MCG tablet Take one tablet once daily for thyroid  90 tablet  3  . NOVOLIN R 100 UNIT/ML injection inject three times a day A DAY PER CARB/INSULIN RATIO   20 mL  4  . ferrous sulfate 325 (65 FE) MG tablet Take 1 tablet (325 mg total) by mouth daily with breakfast.  30 tablet  11   No current facility-administered medications on file prior to visit.   Physical exam BP 120/64  Pulse 68  Temp(Src) 98.1 F (36.7 C) (Oral)  Resp 16  Wt 168 lb 12.8 oz (76.567 kg)  SpO2 94%  Constitutional: he is in post ictal period, able to communicate but no alert and oriented HENT:   Head: Normocephalic and atraumatic.   Mouth/Throat: Oropharynx is clear and moist.   Eyes: Conjunctivae and EOM are normal. Pupils are dilated Neck: Normal range of motion. Neck supple. No JVD present. No thyromegaly present.   Cardiovascular: Normal rate, regular rhythm and normal heart sounds.    No murmur heard. Pulmonary/Chest: Effort normal and breath sounds normal. No respiratory distress. He has no wheezes.   Abdominal: Soft. Bowel sounds are normal. He exhibits no distension. There is no tenderness.  Musculoskeletal: Normal range of motion. He exhibits no edema and no tenderness.  Lymphadenopathy:    He has no cervical adenopathy.  Neurological: involuntary jerk noted in his upper extremities, unable to assess sensation Skin: Skin is warm and diaphoretic Psychiatric:  Calm this visit  labs  CBC    Component Value Date/Time   WBC 9.1 03/14/2013 1541   WBC 8.7 07/19/2012 1309   RBC 3.92* 03/14/2013 1541   RBC 3.93* 07/19/2012 1309   HGB 12.1* 03/14/2013 1541   HCT 37.2* 03/14/2013 1541   PLT 332.0 07/19/2012 1309   MCV 95 03/14/2013 1541   MCH 30.9 03/14/2013 1541   MCH 27.6 02/01/2012 0536   MCHC 32.5 03/14/2013 1541   MCHC 33.2 07/19/2012 1309   RDW 14.6 03/14/2013 1541   RDW 15.9* 07/19/2012 1309   LYMPHSABS 1.9 03/14/2013 1541   LYMPHSABS 2.0 07/19/2012 1309   MONOABS 0.4 07/19/2012 1309   EOSABS 0.1 03/14/2013 1541   EOSABS 0.2 07/19/2012 1309   BASOSABS 0.0 03/14/2013 1541   BASOSABS 0.0 07/19/2012 1309    CMP     Component Value Date/Time   NA  139 03/14/2013 1541   NA 136 07/19/2012 1309   K 5.3* 03/14/2013 1541   CL 99 03/14/2013 1541   CO2 24 03/14/2013 1541   GLUCOSE 356* 03/14/2013 1541   GLUCOSE 159* 07/19/2012 1309   BUN 33* 03/14/2013 1541   BUN 16 07/19/2012 1309   CREATININE 1.19 03/14/2013 1541   CALCIUM 9.9 03/14/2013 1541   CALCIUM 8.6 01/11/2012 0418   PROT 7.0 03/14/2013 1541   PROT 6.2 01/08/2012 0450   ALBUMIN 2.6* 01/11/2012 0418   AST 12 03/14/2013 1541   ALT  13 03/14/2013 1541   ALKPHOS 68 03/14/2013 1541   BILITOT 0.4 03/14/2013 1541   GFRNONAA 73 03/14/2013 1541   GFRAA 84 03/14/2013 1541   Lab Results  Component Value Date   HGBA1C 6.5 05/18/2013   Lab Results  Component Value Date   TSH 14.66* 01/31/2013   Assessment/plan  Hypoglycemia Type 1 dm , non compliant with his medication and diet, mentions he had his lunch but pt known to have hypoglycemic episode in the past. Follows with endocrinology. Received 2 glucose gel tubes and 1 glucose tab after which reading still unrecordable. EMS called, vital signs are stable. Given hypoglycemic seizure will send him to ED for admission and further workup. His lantus dose needs to be reduced further  Seizure- he had a witnessed episode of seizure with blood sugar unrecordable in glucometer. He is in post ictal phase and being sent to the ED at present.   Had addressed his HTN and anxiety prior to his blood work and acute decline. Plan was to continue current regimen of medication without any change but at present pt being sent to the ED and bp medication can be adjusted if needed inpatient. Routine labs had been ordered in office today and have been drawn  Spent more than 60 minutes with the patient today

## 2013-08-15 NOTE — ED Notes (Addendum)
Pt reports to the ED via [facility] for eval of hypoglycemia and possible seizure-like activity. Per his PCP pt was witnessed to have an episode where his pupils were fixed and some tonic-clonic seizure like activity, lasted approx 1 minute. Pts CBG read low on the meter and the PCPs office. Pt was given two 4 gram sugar tablets by the PCP and upon EMS arrival pts CBG was 22  Mg/dl. Pt was given a tube of oral glucose. Pt is A&O x4 and has remained so throughout transport. Pt was being seen for an unrelated issue and reports that this happens to him once a month. Skin slightly diaphoretic. Resp e/u.

## 2013-08-15 NOTE — ED Provider Notes (Signed)
CSN: [id number]     Arrival date & time 08/15/13  1525 History   First MD Initiated Contact with Patient 08/15/13 1541     Chief Complaint  Patient presents with  . Hypoglycemia  . Seizures   (Consider location/radiation/quality/duration/timing/severity/associated sxs/prior Treatment) HPI Reginald Gutierrez is a 47 y.o. male who presents to the emergency department by EMS after a possible seizure at his PCP's office.  Patient reports that he is a type 1 diabetic and had not eaten all day as he was going to his doctor today.  At his doctor's office he apparently had an episode where he had seizure like activity with postictal period.  Blood sugar showed sugar reading that was too low to display.  Patient given some sugar tablets.  EMS blood glucose reading 22 and patient starting to return to normal mentation.  On arrival, patient now with no complaints whatsoever.  He reports feeling well and better.  No history of seizures.  No headaches.  No fevers.  No unilateral weakness/numbness or any other abnormal sensations.  Past Medical History  Diagnosis Date  . Diabetes mellitus type 1   . Hyperlipidemia   . Depression   . Umbilical hernia   . Hypertension   . Glaucoma   . CHF (congestive heart failure)   . Heart attack 12/16/11  . Hypothyroidism   . CAD (coronary artery disease) of artery bypass graft 07/19/2012  . Hypoglycemia, unspecified   . Disorder of bone and cartilage, unspecified   . Acute posthemorrhagic anemia   . Anxiety state, unspecified   . Chronic pain syndrome   . Other primary cardiomyopathies   . Acute respiratory failure   . Dysphagia, oral phase   . Stress fracture of tibia or fibula   . Syncope and collapse    Past Surgical History  Procedure Laterality Date  . Appendectomy      open  . Tibia im nail insertion  12/18/2011    Procedure: INTRAMEDULLARY (IM) NAIL TIBIAL;  Surgeon: Eugenia Mcalpine, MD;  Location: [facility];  Service: Orthopedics;  Laterality: Bilateral;  .  Insertion of dialysis catheter  12/31/2011    Procedure: INSERTION OF DIALYSIS CATHETER;  Surgeon: Chuck Hint, MD;  Location: [facility] OR;  Service: Vascular;  Laterality: N/A;  . Tonsillectomy  1980  . Fracture surgery  2012    wrist  . Fracture surgery  2013    bilateral tibia   History reviewed. No pertinent family history. History  Substance Use Topics  . Smoking status: Current Every Day Smoker -- 1.00 packs/day    Types: Cigarettes  . Smokeless tobacco: Never Used  . Alcohol Use: No    Review of Systems  Constitutional: Negative for fever and chills.  HENT: Negative for congestion and sore throat.   Respiratory: Negative for cough.   Gastrointestinal: Negative for nausea, vomiting, abdominal pain, diarrhea and constipation.  Endocrine: Negative for polyuria.  Genitourinary: Negative for dysuria and hematuria.  Musculoskeletal: Negative for neck pain.  Skin: Negative for rash.  Neurological: Positive for seizures. Negative for dizziness, tremors, syncope, facial asymmetry, speech difficulty, weakness, light-headedness, numbness and headaches.  Psychiatric/Behavioral: Negative.   All other systems reviewed and are negative.    Allergies  Vancomycin  Home Medications   Current Outpatient Rx  Name  Route  Sig  Dispense  Refill  . ACCU-CHEK AVIVA PLUS test strip      TEST 6 TIMES A DAY AS DIRECTED.   150 each   12   .  aspirin 81 MG tablet   Oral   Take 81 mg by mouth daily.         Marland Kitchen atorvastatin (LIPITOR) 40 MG tablet   Oral   Take 1 tablet (40 mg total) by mouth daily.   30 tablet   3   . glucagon (GLUCAGON EMERGENCY) 1 MG injection   Intramuscular   Inject 1 mg into the muscle once as needed. Use as Directed for low blood sugar   3 each   5   . glucose blood test strip      TEST BLOOD SUGAR 6 TIMES A DAY AS INSTRUCTED BY DR ELLISON   200 each   10     ACCU-CHEK AVIVA PLUS TEST STRIPS   . HYDROcodone-acetaminophen (NORCO) 10-325 MG per  tablet               . insulin glargine (LANTUS) 100 UNIT/ML injection   Subcutaneous   Inject 30 Units into the skin at bedtime.         . insulin regular (NOVOLIN R,HUMULIN R) 100 units/mL injection   Subcutaneous   Inject 0-4 Units into the skin 3 (three) times daily before meals. Sliding scale- 1 unit per 200 cbg         . INSULIN SYRINGE .5CC/29G 29G X 1/2" 0.5 ML MISC   Does not apply   1 Syringe by Does not apply route daily.   30 each   11   . levothyroxine (SYNTHROID, LEVOTHROID) 100 MCG tablet      Take one tablet once daily for thyroid   90 tablet   3   . lisinopril (PRINIVIL,ZESTRIL) 40 MG tablet   Oral   Take 0.5 tablets (20 mg total) by mouth daily.   15 tablet   4   . LORazepam (ATIVAN) 2 MG tablet      Take one tablet by mouth every 12 hours if needed for anxiety   60 tablet   0   . metoprolol tartrate (LOPRESSOR) 25 MG tablet   Oral   Take 0.5 tablets (12.5 mg total) by mouth 2 (two) times daily.   30 tablet   3   . EXPIRED: ferrous sulfate 325 (65 FE) MG tablet   Oral   Take 1 tablet (325 mg total) by mouth daily with breakfast.   30 tablet   11    BP 146/62  Pulse 78  Temp(Src) 98.2 F (36.8 C) (Oral)  Resp 24  SpO2 99% Physical Exam  Nursing note and vitals reviewed. Constitutional: He is oriented to person, place, and time. He appears well-developed and well-nourished. No distress.  HENT:  Head: Normocephalic and atraumatic.  Right Ear: External ear normal.  Left Ear: External ear normal.  Mouth/Throat: Oropharynx is clear and moist. No oropharyngeal exudate.  Eyes: Conjunctivae are normal. Pupils are equal, round, and reactive to light. Right eye exhibits no discharge.  Neck: Normal range of motion. Neck supple. No tracheal deviation present.  Cardiovascular: Normal rate, regular rhythm and intact distal pulses.   Pulmonary/Chest: Effort normal. No respiratory distress. He has no wheezes. He has no rales.  Abdominal: Soft.  He exhibits no distension. There is no tenderness. There is no rebound and no guarding.  Musculoskeletal: Normal range of motion.  Neurological: He is alert and oriented to person, place, and time. He has normal strength and normal reflexes. No cranial nerve deficit or sensory deficit. He displays a negative Romberg sign. Coordination and gait normal. GCS  eye subscore is 4. GCS verbal subscore is 5. GCS motor subscore is 6.  Skin: Skin is warm and dry. No rash noted. He is not diaphoretic.  Psychiatric: He has a normal mood and affect.    ED Course  Procedures (including critical care time) Labs Review Labs Reviewed  GLUCOSE, CAPILLARY - Abnormal; Notable for the following:    Glucose-Capillary 33 (*)    All other components within normal limits  GLUCOSE, CAPILLARY - Abnormal; Notable for the following:    Glucose-Capillary 105 (*)    All other components within normal limits   Imaging Review No results found.  EKG Interpretation    Date/Time:  Tuesday August 15 2013 15:32:56 EST Ventricular Rate:  76 PR Interval:  137 QRS Duration: 79 QT Interval:  351 QTC Calculation: 395 R Axis:   61 Text Interpretation:  Age not entered, assumed to be  47 years old for purpose of ECG interpretation Sinus rhythm No significant change since last tracing Confirmed by HARRISON  MD, FORREST ([id number]) on 08/15/2013 3:53:40 PM            MDM   1. Hypoglycemia   2. Seizure    Reginald Gutierrez is a 47 y.o. male with history of DM1 who had a seizure today and was found to have critically low blood glucose.  Patient given sugar packets and with normalization of mentation.  On arrival, patient states, "It's obvious, my sugar got low and I had a seizure, I do not want any further testing, let me go."  Initially, patient still hypoglycemic here, but then with food, glucose improved to >100.  Explained that although his glucose was low initially, he had no history of seizures and a workup with labs and  imaging was warranted.  Patient understood, but still wanted to leave.  Patient understood risks of death, disability, or unnecessary harm before leaving.  Patient of sound mind, oriented, and with normal vitals and glucose.  Patient competent to make this decision.  Recommended in writing and verbally to avoid heights, bodies of water, and to completely stop driving cars or operating machinery until cleared by neurologist.  Patient will schedule outpatient eval with neurologist.  Patient discharged.    Arloa Koh, MD 08/15/13 [phone]

## 2013-08-15 NOTE — ED Notes (Signed)
Pt. Ate 100% of his lunch bag.  Doctor at the bedside speaking with him

## 2013-08-15 NOTE — ED Notes (Addendum)
Pt adamently refusing IV stating he is not going to sit here forever and he will only drink orange juice and eat. Pt upset and swearing. Pt A&O x4, and aware of risks and benefits of not having an IV. Pt given orange juice and sandwich.

## 2013-08-16 LAB — LIPID PANEL
Chol/HDL Ratio: 3.6 ratio units (ref 0.0–5.0)
Cholesterol, Total: 225 mg/dL — ABNORMAL HIGH (ref 100–199)
HDL: 62 mg/dL (ref >39)
LDL Calculated: 143 mg/dL — ABNORMAL HIGH (ref 0–99)
VLDL Cholesterol Cal: 20 mg/dL (ref 5–40)

## 2013-08-16 LAB — COMPREHENSIVE METABOLIC PANEL
Albumin/Globulin Ratio: 1.6 (ref 1.1–2.5)
Albumin: 4.7 g/dL (ref 3.5–5.5)
Alkaline Phosphatase: 74 IU/L (ref 39–117)
BUN/Creatinine Ratio: 23 — ABNORMAL HIGH (ref 9–20)
BUN: 23 mg/dL (ref 6–24)
Calcium: 10.5 mg/dL — ABNORMAL HIGH (ref 8.7–10.2)
Chloride: 103 mmol/L (ref 97–108)
Creatinine, Ser: 1 mg/dL (ref 0.76–1.27)
GFR calc Af Amer: 103 mL/min/{1.73_m2} (ref >59)
GFR calc non Af Amer: 89 mL/min/{1.73_m2} (ref >59)
Globulin, Total: 2.9 g/dL (ref 1.5–4.5)
Glucose: 8 mg/dL — CL (ref 65–99)
Total Bilirubin: 0.5 mg/dL (ref 0.0–1.2)
Total Protein: 7.6 g/dL (ref 6.0–8.5)

## 2013-08-16 LAB — T3, FREE: T3, Free: 2.4 pg/mL (ref 2.0–4.4)

## 2013-08-16 LAB — TSH: TSH: 29.57 u[IU]/mL — ABNORMAL HIGH (ref 0.450–4.500)

## 2013-08-16 NOTE — ED Provider Notes (Signed)
Medical screening examination/treatment/procedure(s) were conducted as a shared visit with resident physician and myself.  I personally evaluated the patient during the encounter.    EKG Interpretation    Date/Time:  Tuesday August 15 2013 15:32:56 EST Ventricular Rate:  76 PR Interval:  137 QRS Duration: 79 QT Interval:  351 QTC Calculation: 395 R Axis:   61 Text Interpretation:  Age not entered, assumed to be  47 years old for purpose of ECG interpretation Sinus rhythm No significant change since last tracing Confirmed by Lieutenant Abarca  MD, Aubriana Ravelo [phone]) on 08/15/2013 3:53:40 PM            I interviewed and examined the patient. Lungs are CTAB. Cardiac exam wnl. Abdomen soft.  Seizure episode likely related to hypoglycemia. Tolerating po here. Pt at baseline, refusing labs, would like to go. Seizure and return precautions given.   Junius Argyle, MD 08/16/13 1316

## 2013-08-17 ENCOUNTER — Telehealth: Payer: Self-pay | Admitting: Endocrinology

## 2013-08-17 NOTE — Telephone Encounter (Signed)
please call patient: Needs f/u ov next avail appt

## 2013-08-17 NOTE — Telephone Encounter (Signed)
Called patient scheduled for January 5th 2015 at 7:45.

## 2013-09-04 ENCOUNTER — Encounter: Payer: Self-pay | Admitting: Endocrinology

## 2013-09-04 ENCOUNTER — Ambulatory Visit (INDEPENDENT_AMBULATORY_CARE_PROVIDER_SITE_OTHER): Payer: Medicare Other | Admitting: Endocrinology

## 2013-09-04 VITALS — BP 126/72 | HR 74 | Temp 98.5°F | Ht 66.0 in | Wt 166.4 lb

## 2013-09-04 DIAGNOSIS — E109 Type 1 diabetes mellitus without complications: Secondary | ICD-10-CM

## 2013-09-04 MED ORDER — LEVOTHYROXINE SODIUM 125 MCG PO TABS: 125.0000 ug | ORAL_TABLET | Freq: Every day | ORAL | Status: DC

## 2013-09-04 NOTE — Progress Notes (Signed)
Subjective:    Patient ID: Reginald Gutierrez, male    DOB: September 01, 1965, 48 y.o.   MRN: [id number]  HPI Pt returns for f/u of type 1 DM (dx'ed 1969, when he presented with an uncertain type of acute illness; he has mild if any neuropathy of the lower extremities; he has associated renal failure, retinopathy, PAD, and CAD (refused CABG); he has required a simple qd insulin regimen; in late 2013, he had a episode of severe hypoglycemia).  no cbg record, but states cbg's are mildly low, approx once a week. This happens in the afternoon, after he misses a meal.   Pt says he never missed the thyroid pill. Past Medical History  Diagnosis Date  . Diabetes mellitus type 1   . Hyperlipidemia   . Depression   . Umbilical hernia   . Hypertension   . Glaucoma   . CHF (congestive heart failure)   . Heart attack 12/16/11  . Hypothyroidism   . CAD (coronary artery disease) of artery bypass graft 07/19/2012  . Hypoglycemia, unspecified   . Disorder of bone and cartilage, unspecified   . Acute posthemorrhagic anemia   . Anxiety state, unspecified   . Chronic pain syndrome   . Other primary cardiomyopathies   . Acute respiratory failure   . Dysphagia, oral phase   . Stress fracture of tibia or fibula   . Syncope and collapse     Past Surgical History  Procedure Laterality Date  . Appendectomy      open  . Tibia im nail insertion  12/18/2011    Procedure: INTRAMEDULLARY (IM) NAIL TIBIAL;  Surgeon: Eugenia Mcalpine, MD;  Location: [facility];  Service: Orthopedics;  Laterality: Bilateral;  . Insertion of dialysis catheter  12/31/2011    Procedure: INSERTION OF DIALYSIS CATHETER;  Surgeon: Chuck Hint, MD;  Location: [facility];  Service: Vascular;  Laterality: N/A;  . Tonsillectomy  1980  . Fracture surgery  2012    wrist  . Fracture surgery  2013    bilateral tibia    History   Social History  . Marital Status: Single    Spouse Name: N/A    Number of Children: 0  . Years of Education: 16    Occupational History  . Sales- disabled    Social History Main Topics  . Smoking status: Current Every Day Smoker -- 1.00 packs/day    Types: Cigarettes  . Smokeless tobacco: Never Used  . Alcohol Use: No  . Drug Use: No     Comment: Past Cocaine abuse-2002-2003  . Sexual Activity: No   Other Topics Concern  . Not on file   Social History Narrative  . No narrative on file    Current Outpatient Prescriptions on File Prior to Visit  Medication Sig Dispense Refill  . ACCU-CHEK AVIVA PLUS test strip TEST 6 TIMES A DAY AS DIRECTED.  150 each  12  . aspirin 81 MG tablet Take 81 mg by mouth daily.      Marland Kitchen atorvastatin (LIPITOR) 40 MG tablet Take 1 tablet (40 mg total) by mouth daily.  30 tablet  3  . glucagon (GLUCAGON EMERGENCY) 1 MG injection Inject 1 mg into the muscle once as needed. Use as Directed for low blood sugar  3 each  5  . glucose blood test strip TEST BLOOD SUGAR 6 TIMES A DAY AS INSTRUCTED BY DR Yeng Perz  200 each  10  . HYDROcodone-acetaminophen (NORCO) 10-325 MG per tablet       .  insulin glargine (LANTUS) 100 UNIT/ML injection Inject 25 Units into the skin at bedtime.       . INSULIN SYRINGE .5CC/29G 29G X 1/2" 0.5 ML MISC 1 Syringe by Does not apply route daily.  30 each  11  . lisinopril (PRINIVIL,ZESTRIL) 40 MG tablet Take 0.5 tablets (20 mg total) by mouth daily.  15 tablet  4  . LORazepam (ATIVAN) 2 MG tablet Take one tablet by mouth every 12 hours if needed for anxiety  60 tablet  0  . metoprolol tartrate (LOPRESSOR) 25 MG tablet Take 0.5 tablets (12.5 mg total) by mouth 2 (two) times daily.  30 tablet  3  . ferrous sulfate 325 (65 FE) MG tablet Take 1 tablet (325 mg total) by mouth daily with breakfast.  30 tablet  11   No current facility-administered medications on file prior to visit.    Allergies  Allergen Reactions  . Vancomycin Other (See Comments)    Pt doesn't remember     No family history on file.  BP 126/72  Pulse 74  Temp(Src) 98.5 F  (36.9 C) (Oral)  Ht 5\' 6"  (1.676 m)  Wt 166 lb 7 oz (75.496 kg)  BMI 26.88 kg/m2  Review of Systems Denies depression and weight change.     Objective:   Physical Exam VITAL SIGNS:  See vs page GENERAL: no distress   Lab Results  Component Value Date   HGBA1C 6.7* 08/15/2013   Lab Results  Component Value Date   TSH 29.570* 08/15/2013   T3TOTAL 65.0* 12/20/2011   T4TOTAL 5.2 08/15/2013      Assessment & Plan:  DM: This insulin regimen was chosen from multiple options, for its simplicity.  overcontrolled, given this regimen, which does match insulin to his changing needs throughout the day CAD: in this setting, he needs to avoid hypoglycemia. Hypothyroidism: uncertain reason for the fluctuating synthroid requirement.

## 2013-09-04 NOTE — Patient Instructions (Addendum)
check your blood sugar 6 times a day--before the 3 meals, and at bedtime.  also check if you have symptoms of your blood sugar being too high or too low.  please keep a record of the readings and bring it to your next appointment here.  please call us sooner if your blood sugar goes below 70, or if it stays over 200. Please come back for a follow-up appointment in 4 months.   On this type of insulin schedule, you should eat meals on a regular schedule.  If a meal is missed or significantly delayed, your blood sugar could go low. Please reduce the lantus to 25 units each morning.  You don't need to take the regular insulin.  Please increase the thyroid pill.  i have sent a prescription to your pharmacy.

## 2013-10-01 IMAGING — CR DG TIBIA/FIBULA 2V*L*
3 series · 3 of 3 positions shown · non-contrast
Comparison: None.

CLINICAL DATA: 45-year-old male with possible fall.  Lower
extremity deformity.  Hypoglycemia.

LEFT TIBIA AND FIBULA - 2 VIEW

[x tib-fib ap left]
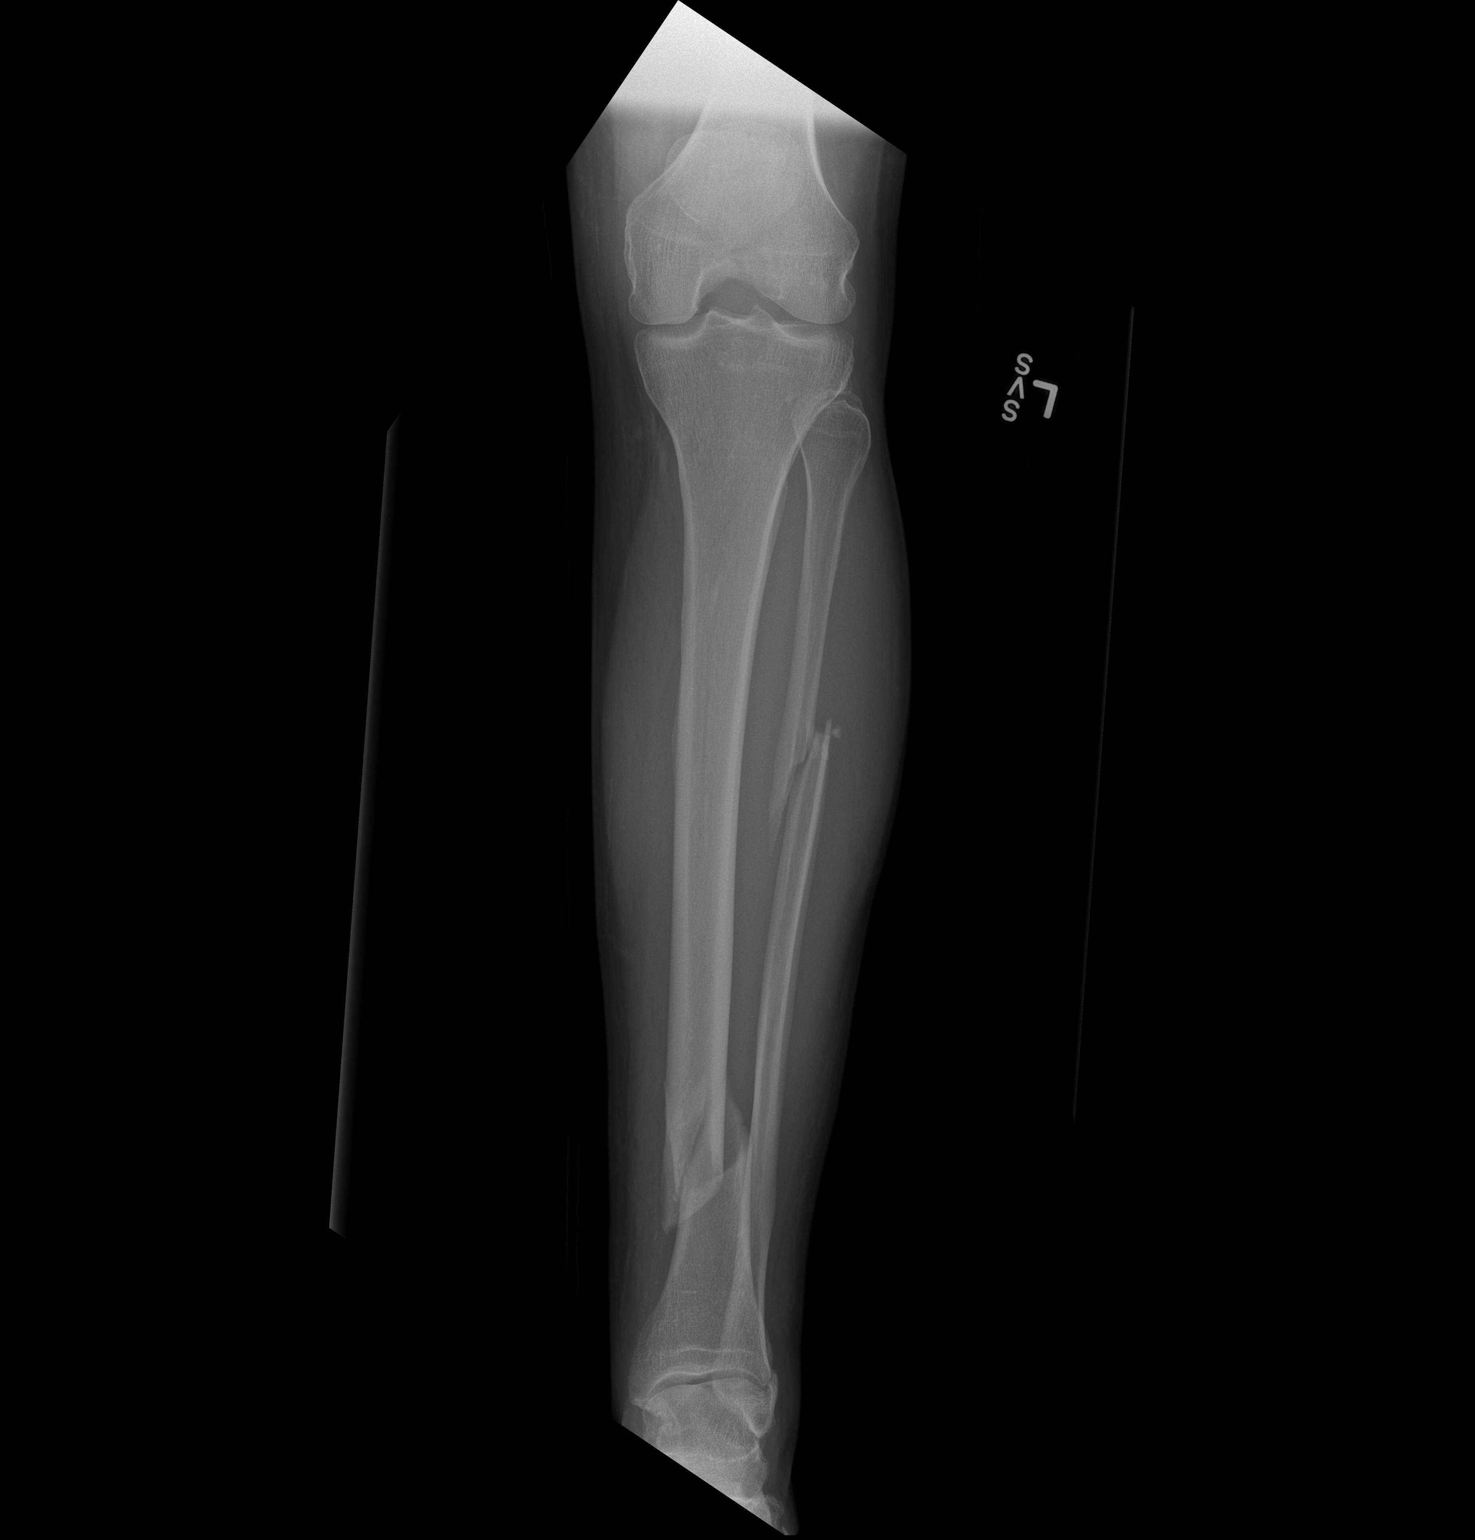

[x tib-fib lat left (1 of 2)]
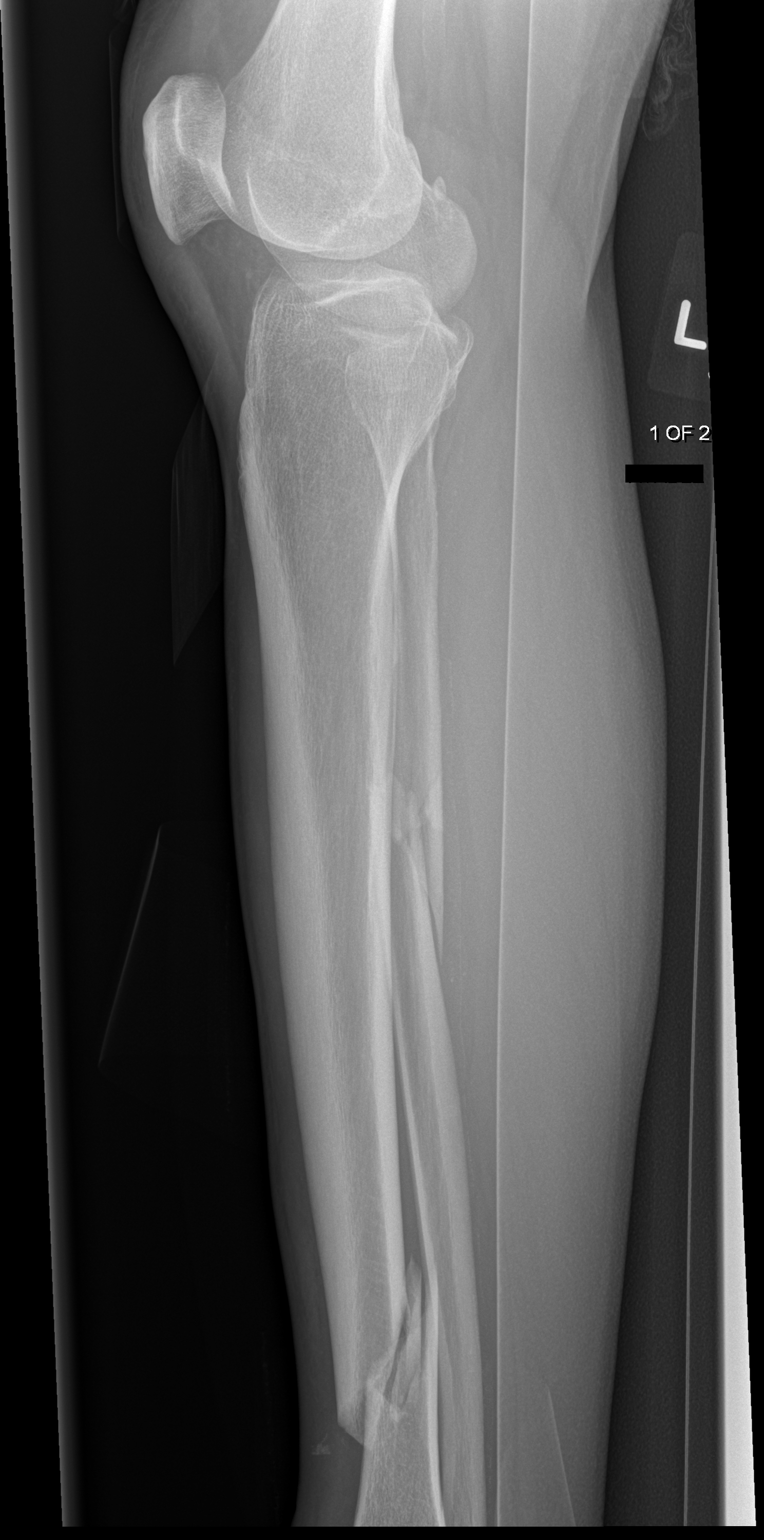

[x tib-fib lat left (2 of 2)]
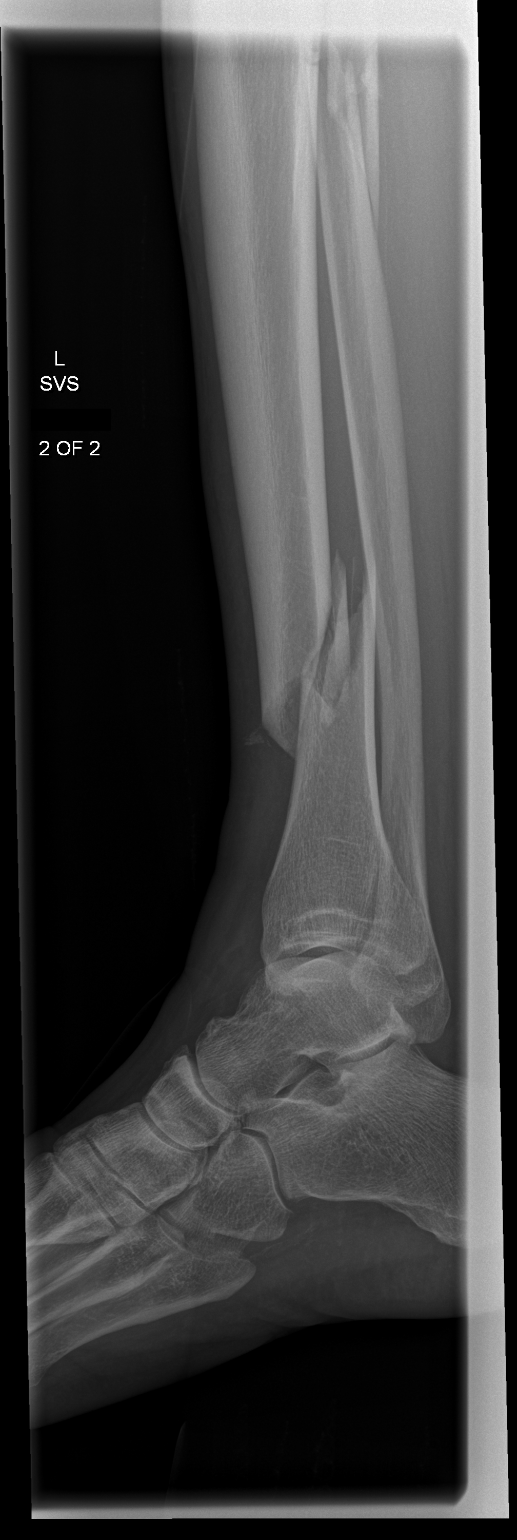

[3 of 3 positions shown; findings below may reference images not displayed]

FINDINGS: Spiral fracture of the distal third left tibia shaft.
Distal fragment posteriorly displaced by one half shaft with,
laterally displaced by one half shaft with, and anteriorly
angulated.

Spiral fracture of the left fibula proximal third shaft.  Lateral
displacement of nearly one full shaft with.  Anterior displacement
of less than one full shaft width and posterior angulation.

Grossly normal alignment at the left knee and ankle.
IMPRESSION: 1.  Distal third left tibia shaft spiral fracture with displacement
and angulation as above.
2.  Proximal third left fibula spiral shaft fracture.

## 2013-10-01 IMAGING — RF DG TIBIA/FIBULA 2V*R*
1 series · 5 of 5 positions shown · non-contrast
Comparison: 12/18/2011

CLINICAL DATA: Postop IM nail of tib-fib fracture

RIGHT TIBIA AND FIBULA - 2 VIEW

[Series 1: run · 5 of 5 slices shown]
[im 1/5]
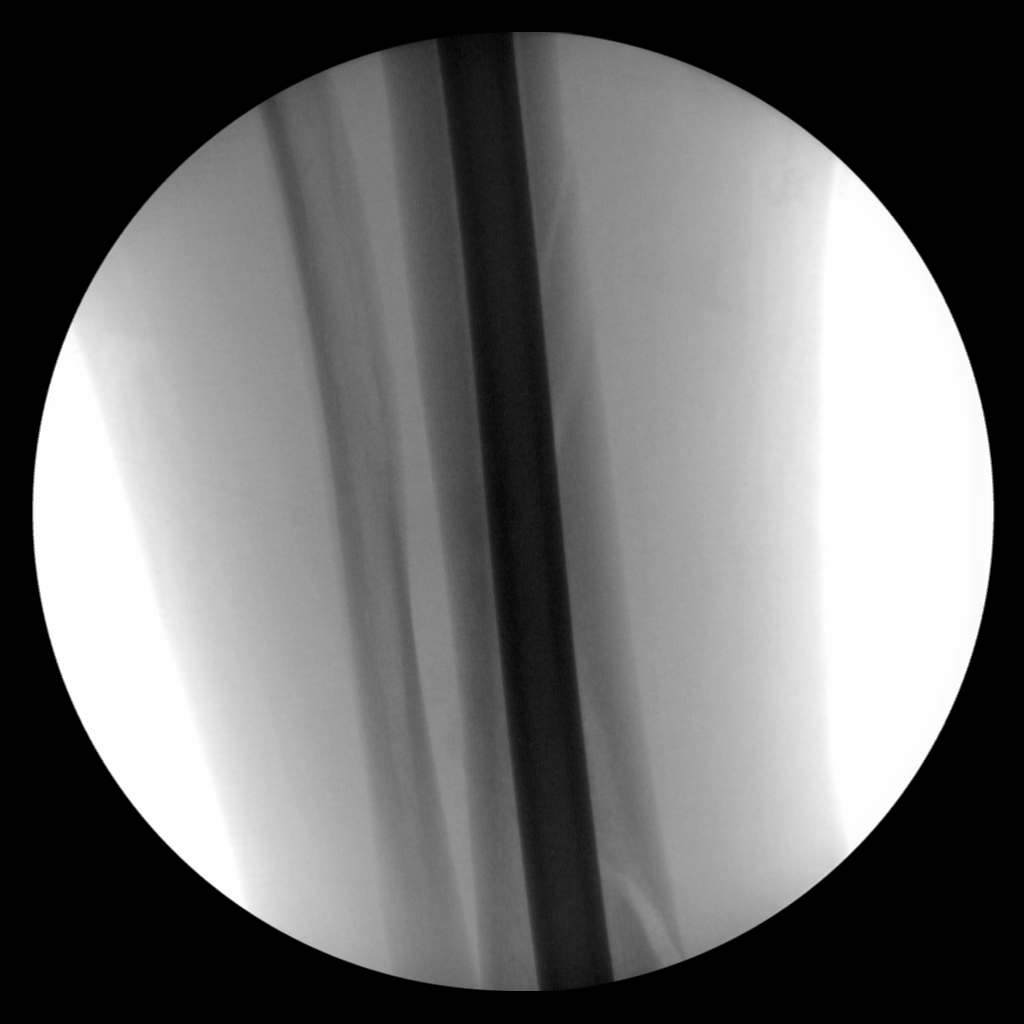
[im 2/5]
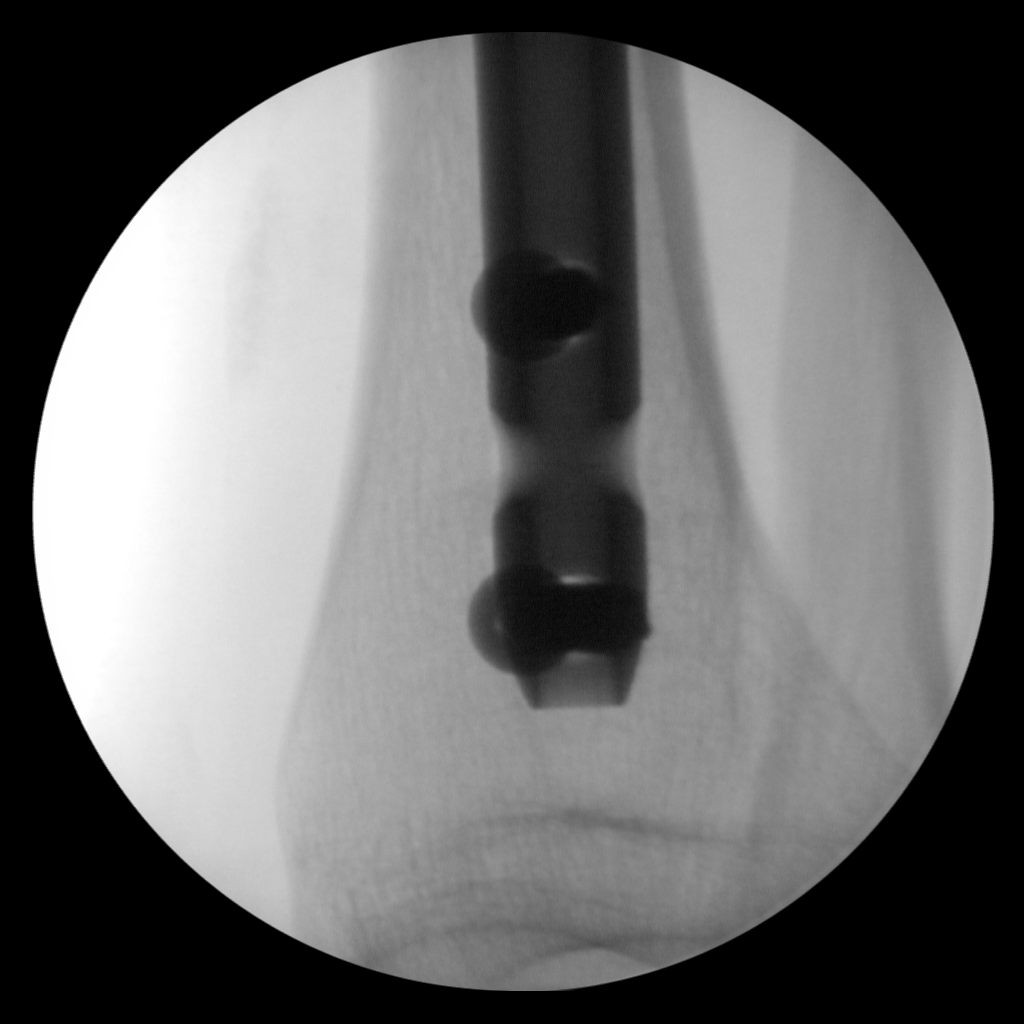
[im 3/5]
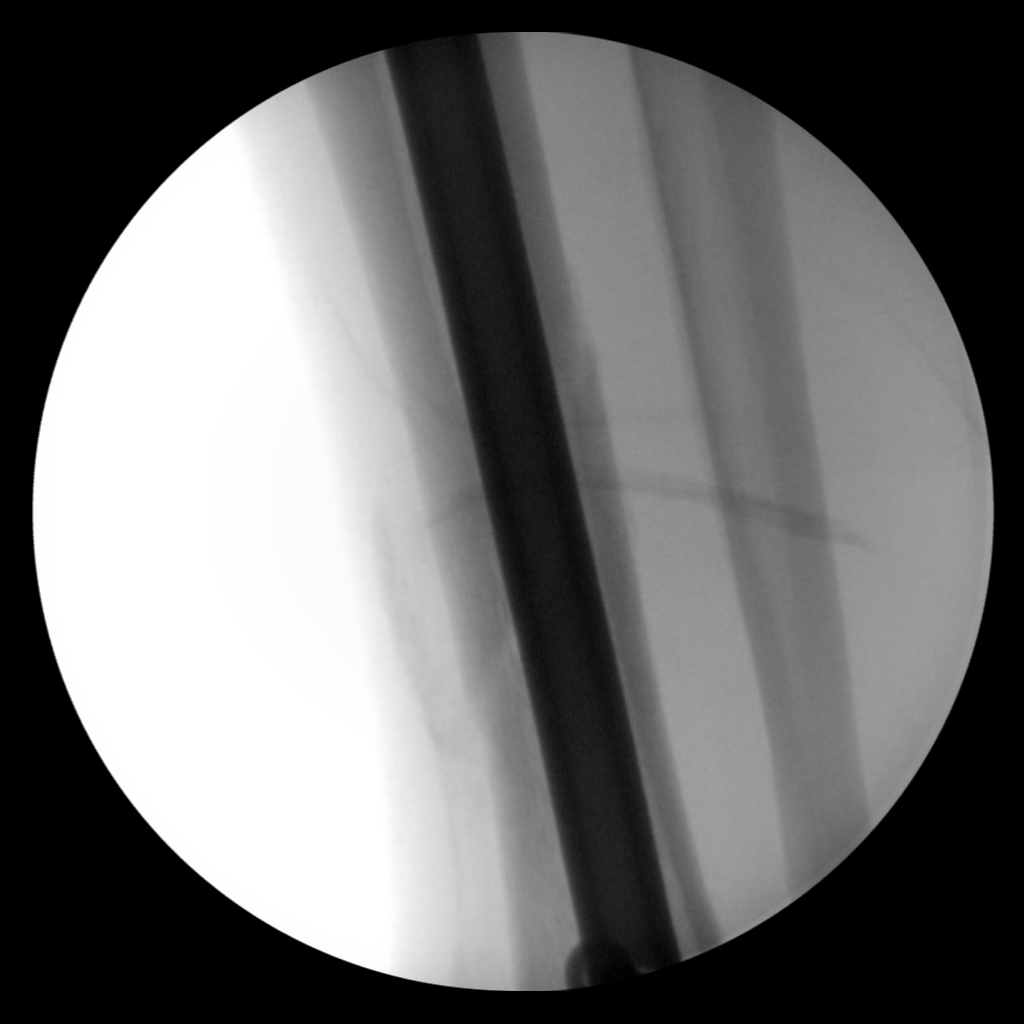
[im 4/5]
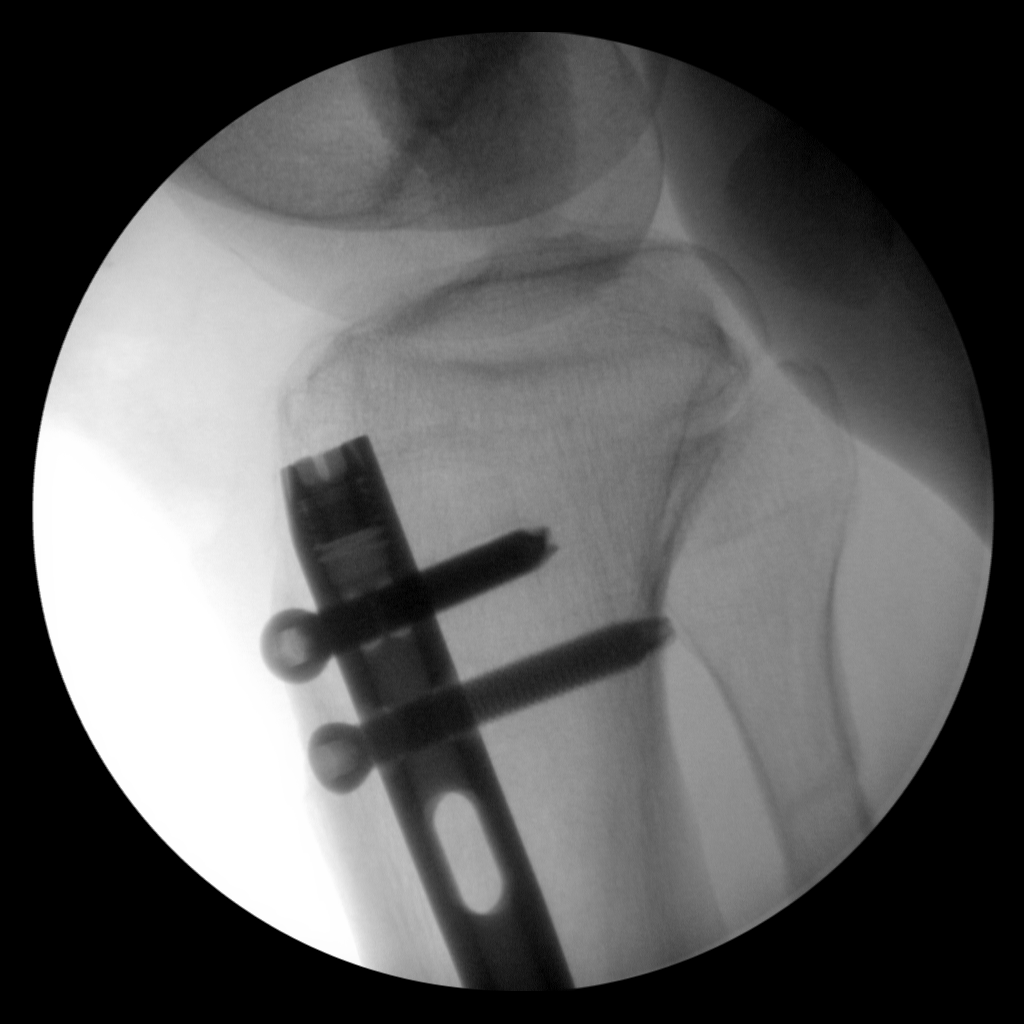
[im 5/5]
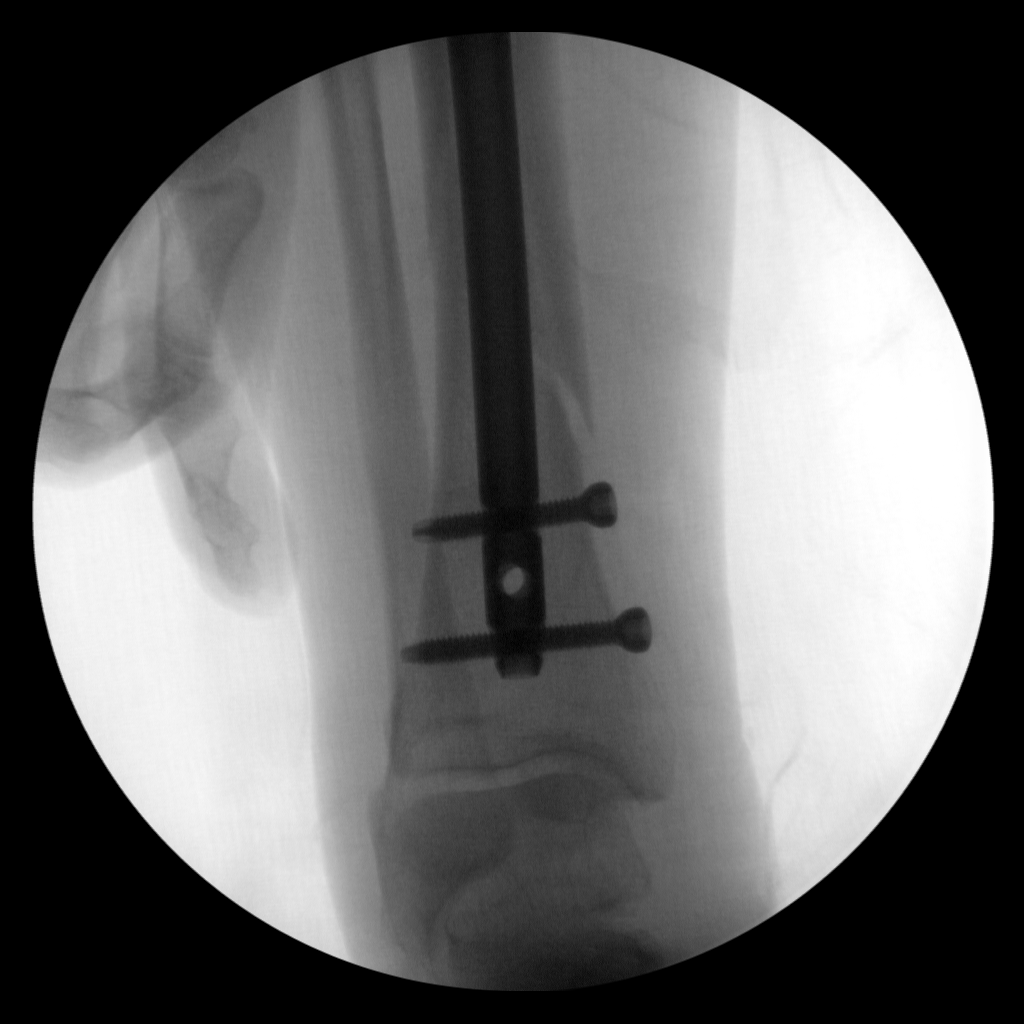

[5 of 5 positions shown; findings below may reference images not displayed]

FINDINGS: IM nail with two proximal and two distal interlocking
screws transfixing an oblique/spiral distal tibial fracture.  The
fracture fragments are in near anatomic alignment and position.

No proximal fibular fracture is not well visualized.
IMPRESSION: Satisfactory appearance status post picture ORIF of a distal tibial
fracture.

## 2013-10-02 IMAGING — CT CT CHEST W/O CM
2 of 3 series · 15 of 36 positions shown, 18 images · non-contrast
Comparison: Chest radiographs dated 12/19/2011

CLINICAL DATA: Postop, shortness of breath, hypoxia, confusion

CT CHEST WITHOUT CONTRAST
TECHNIQUE: Multidetector CT imaging of the chest was performed
following the standard protocol without IV contrast.

[Series 2: chest routine 5.0 b40f · axial · 0.63mm/px · z∈[-456,-206]mm · 12 of 60 slices shown, 15 images]
[im 5/60  mediastinal]
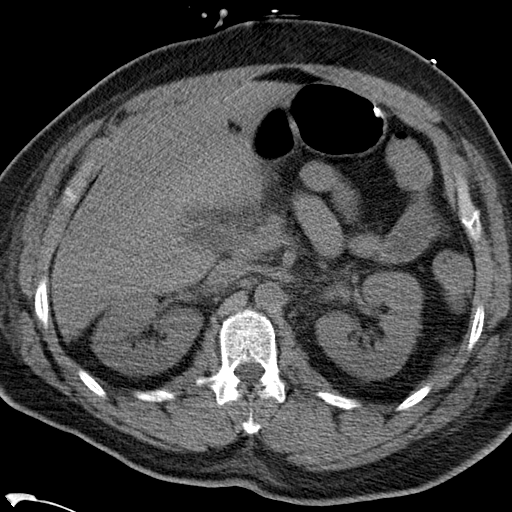
[im 5/60  lung]
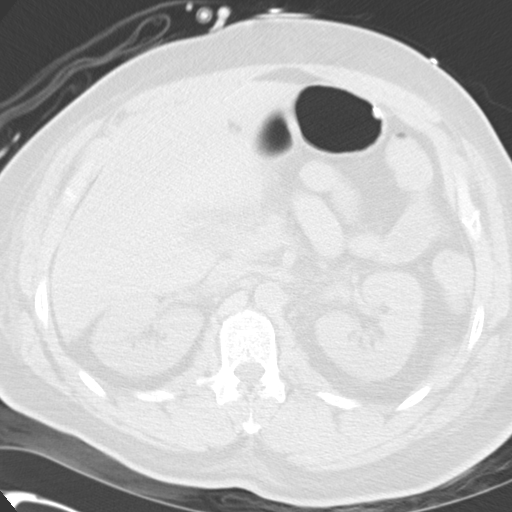
[im 9/60  lung]
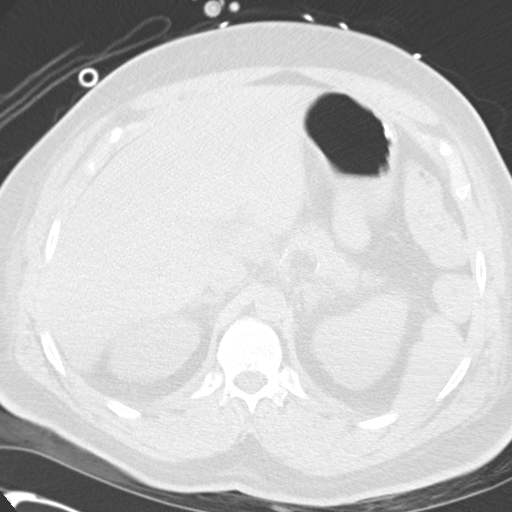
[im 14/60  lung]
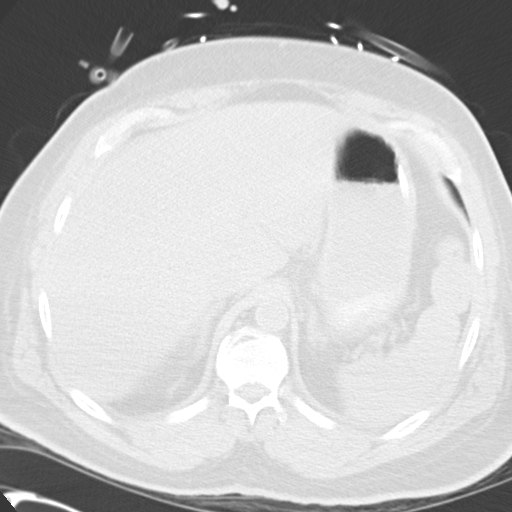
[im 18/60  lung]
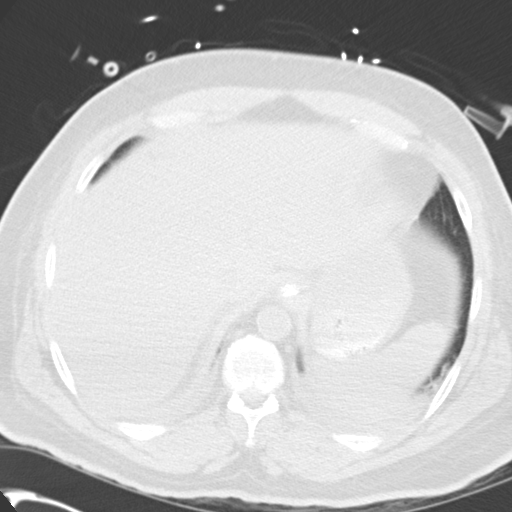
[im 22/60  mediastinal]
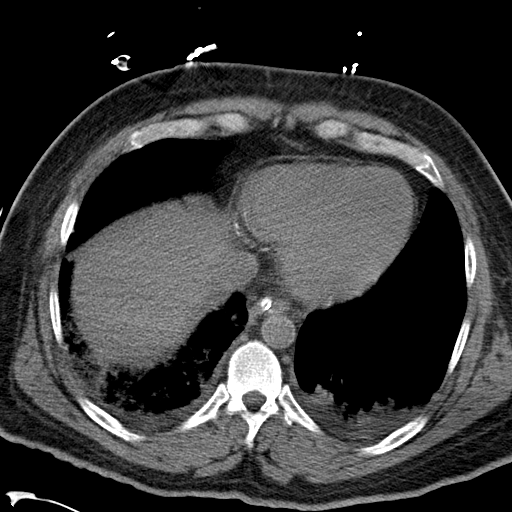
[im 22/60  lung]
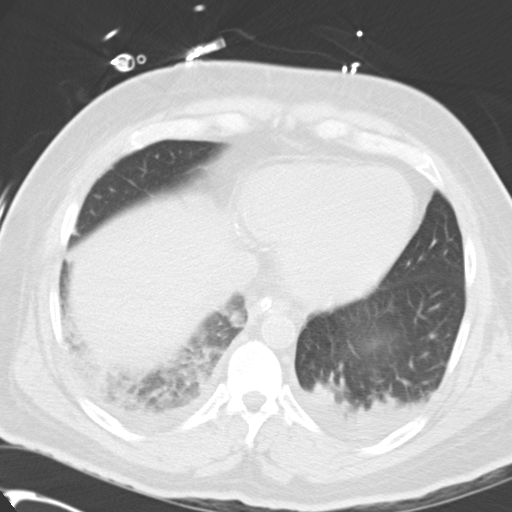
[im 27/60  lung]
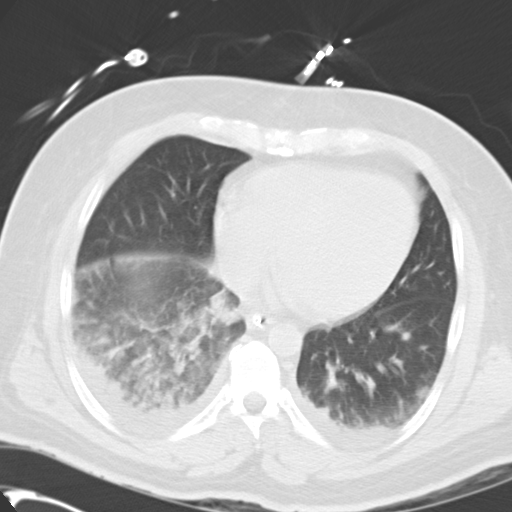
[im 33/60  lung]
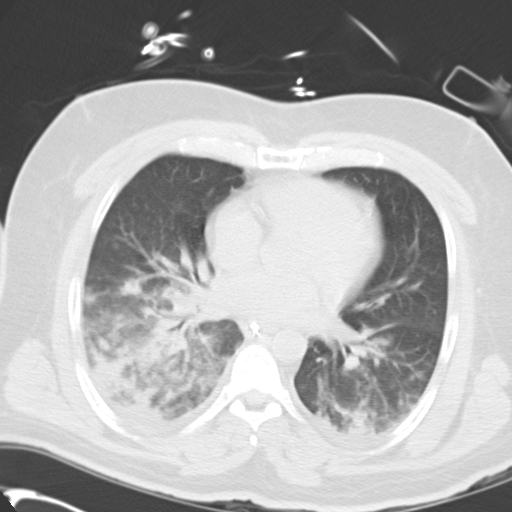
[im 38/60  lung]
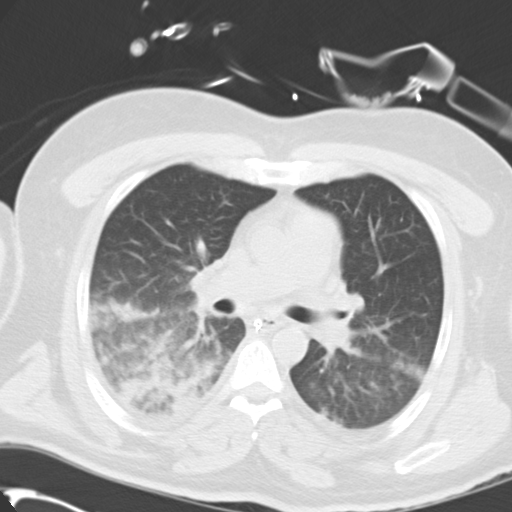
[im 42/60  mediastinal]
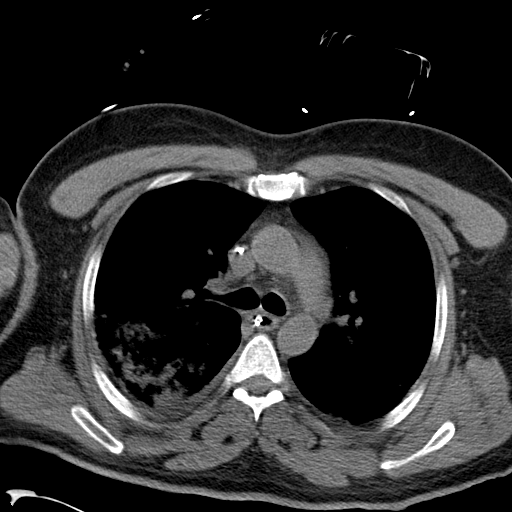
[im 42/60  lung]
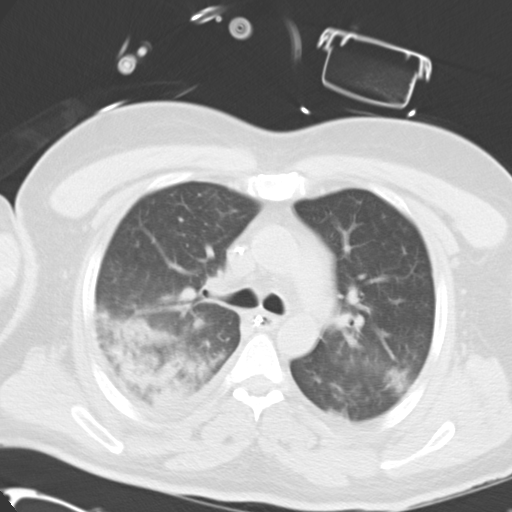
[im 46/60  lung]
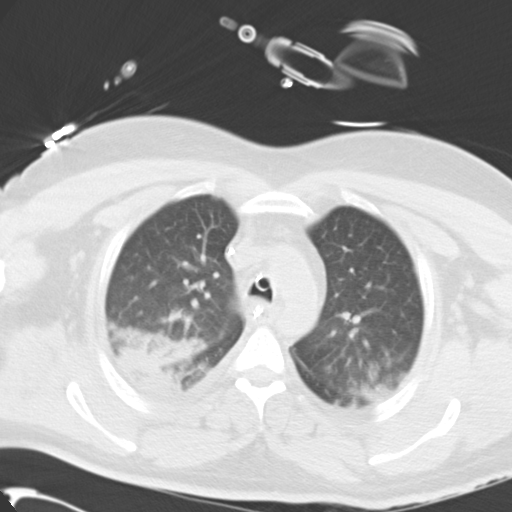
[im 51/60  lung]
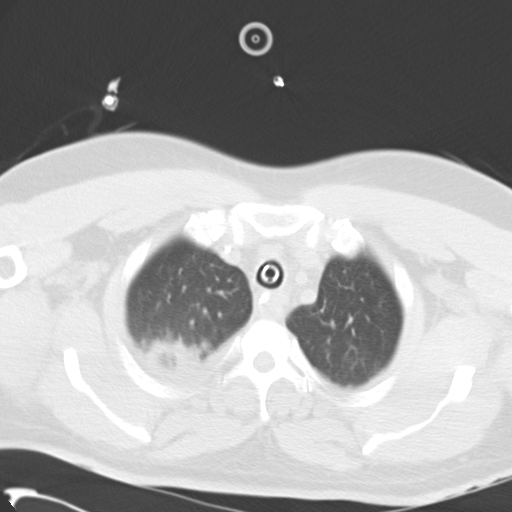
[im 55/60  lung]
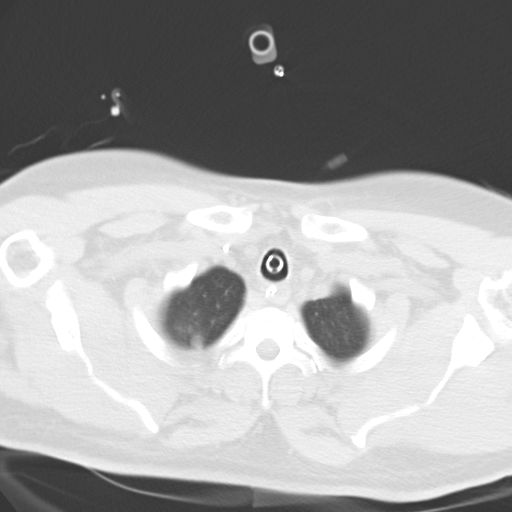

[Series 602: coronal · coronal · 0.63mm/px · 3 of 85 slices shown]
[im 17/85  lung]
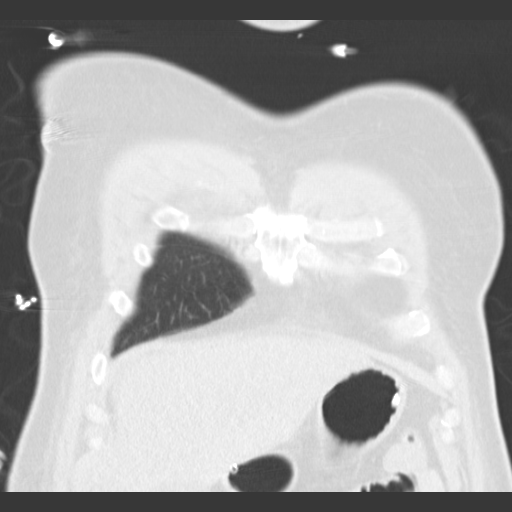
[im 34/85  lung]
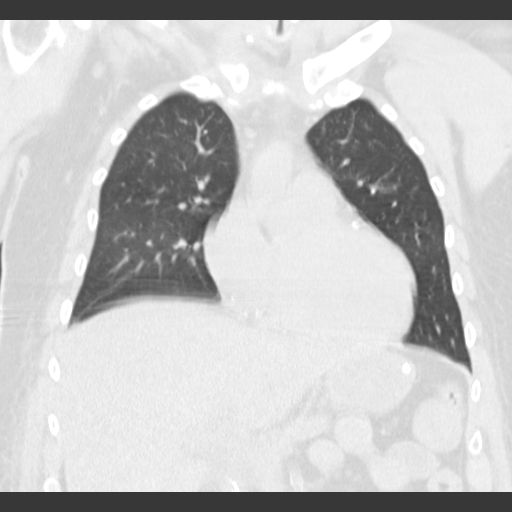
[im 51/85  lung]
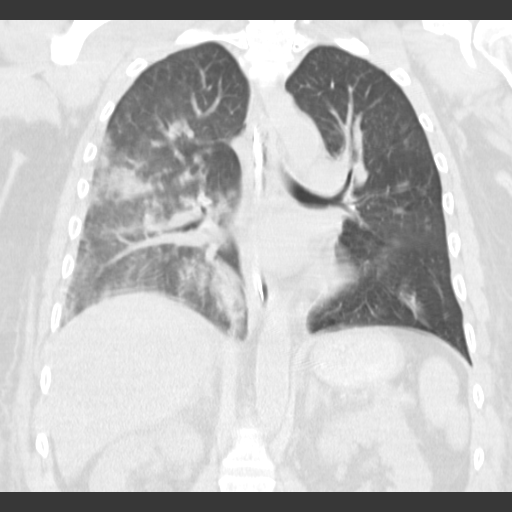

[15 of 36 positions shown; findings below may reference images not displayed]

FINDINGS: Pulmonary arteries cannot be assessed in the absence of
intravenous contrast.

Multifocal patchy opacities in the posterior upper lobes and
bilateral lower lobes, right greater than left, suspicious for
multifocal pneumonia, likely on the basis of aspiration.  Small
bilateral pleural effusions.  No pneumothorax.

Endotracheal tube terminates 1.5 cm above the carina.

The heart is top normal in size.  No pericardial effusion.
Coronary atherosclerosis right IJ venous catheter terminates in the
upper SVC.

No suspicious mediastinal, hilar, or axillary lymphadenopathy.

Visualized upper abdomen is notable for an enteric tube coursing
into the stomach.

Mild degenerative changes of the visualized thoracolumbar spine.
IMPRESSION: Multifocal patchy opacities, suspicious for multifocal pneumonia,
likely on the basis of aspiration.  Small bilateral pleural
effusions.

Pulmonary arteries cannot be assessed in the absence of intravenous
contrast.

Age advanced coronary atherosclerosis.

Support apparatus as above.

## 2013-10-18 ENCOUNTER — Other Ambulatory Visit: Payer: Self-pay | Admitting: Internal Medicine

## 2013-10-18 NOTE — Telephone Encounter (Signed)
Spoke with patient, patient states rx was last given by Dr.Pandey. Patient scheduled follow-up appointment for March 2015 (Vit D level to be checked at appointment)  I called pharmacy and verified last rx'ed by Dr.Pandey 08/09/2012 for a year supply, rx sent in electronically for a month supply, once level checked additional refills to be given (if patient to continue)

## 2013-10-18 NOTE — Telephone Encounter (Signed)
Left message on voicemail for patient to return call when available, reason for call- discussed refill request for prescribed Vit D (not medication list)

## 2013-11-14 ENCOUNTER — Ambulatory Visit: Payer: Self-pay | Admitting: Internal Medicine

## 2013-12-07 ENCOUNTER — Other Ambulatory Visit: Payer: Self-pay | Admitting: Internal Medicine

## 2013-12-23 ENCOUNTER — Other Ambulatory Visit: Payer: Self-pay | Admitting: Internal Medicine

## 2013-12-25 NOTE — Telephone Encounter (Signed)
Patient aware he needs to keep pending appointment for next Tuesday. Chrae B/CMA

## 2014-01-01 ENCOUNTER — Other Ambulatory Visit: Payer: Self-pay | Admitting: Internal Medicine

## 2014-01-02 ENCOUNTER — Ambulatory Visit (INDEPENDENT_AMBULATORY_CARE_PROVIDER_SITE_OTHER): Payer: Medicare Other | Admitting: Internal Medicine

## 2014-01-02 ENCOUNTER — Encounter: Payer: Self-pay | Admitting: Internal Medicine

## 2014-01-02 VITALS — BP 130/68 | HR 71 | Temp 98.0°F | Resp 16 | Wt 170.0 lb

## 2014-01-02 DIAGNOSIS — M25512 Pain in left shoulder: Secondary | ICD-10-CM

## 2014-01-02 DIAGNOSIS — E039 Hypothyroidism, unspecified: Secondary | ICD-10-CM

## 2014-01-02 DIAGNOSIS — I25810 Atherosclerosis of coronary artery bypass graft(s) without angina pectoris: Secondary | ICD-10-CM

## 2014-01-02 DIAGNOSIS — J3489 Other specified disorders of nose and nasal sinuses: Secondary | ICD-10-CM

## 2014-01-02 DIAGNOSIS — M25519 Pain in unspecified shoulder: Secondary | ICD-10-CM

## 2014-01-02 DIAGNOSIS — F411 Generalized anxiety disorder: Secondary | ICD-10-CM

## 2014-01-02 DIAGNOSIS — R0981 Nasal congestion: Secondary | ICD-10-CM | POA: Insufficient documentation

## 2014-01-02 DIAGNOSIS — F4323 Adjustment disorder with mixed anxiety and depressed mood: Secondary | ICD-10-CM | POA: Insufficient documentation

## 2014-01-02 DIAGNOSIS — E109 Type 1 diabetes mellitus without complications: Secondary | ICD-10-CM

## 2014-01-02 MED ORDER — HYDROCODONE-ACETAMINOPHEN 10-325 MG PO TABS: 1.0000 | ORAL_TABLET | ORAL | Status: DC | PRN

## 2014-01-02 MED ORDER — BUDESONIDE 32 MCG/ACT NA SUSP: 2.0000 | Freq: Every day | NASAL | Status: DC

## 2014-01-02 MED ORDER — LORAZEPAM 2 MG PO TABS: ORAL_TABLET | ORAL | Status: DC

## 2014-01-02 NOTE — Progress Notes (Signed)
Patient ID: Reginald FernsKevin J Cieslewicz, male   DOB: [birth year], 48 y.o.   MRN: [id number]    Chief Complaint  Patient presents with  . Follow-up    dm, thyroid,HTN   Allergies  Allergen Reactions  . Vancomycin Other (See Comments)    Pt doesn't remember    HPI 48 y/o male patient is here for follow up. He has not checked his blood sugar today. he does skip meals at times. He has been taking lantus 25 u and sliding scale insulin if cbg > 200. Reviewed dr ellison's note and pt has been advised not to use sliding scale insulin but he would like to keep it on as needed basis. He denies any hypoglycemic episode since last visit.  He tries to be regular with taking his levothyroxine He has not had follow up with cardiology  He gets his pain medication from pain management clinic Taking novolog for cbg > 200 1 u Is not doing any exercise Feels congested in his head. Has started OTC flonase from yesterday   Review of Systems  Constitutional: Negative for fever, chills, weight loss HENT: Negative for hearing loss and sore throat.   Eyes: Negative for blurred vision, double vision and discharge.  Respiratory: Negative for cough, sputum production, shortness of breath and wheezing.   Cardiovascular: Negative for chest pain, palpitations, orthopnea and leg swelling.  Gastrointestinal: Negative for heartburn, nausea, vomiting, abdominal pain, diarrhea and constipation.  Genitourinary: Negative for dysuria, urgency, frequency and flank pain.  Musculoskeletal: Negative for back pain, falls and myalgias. 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 He is scheduled to be seen in pain clinic Skin: Negative for itching and rash.  Psychiatric/Behavioral: Negative for depression and memory loss. The patient is anxious.    Past Medical History  Diagnosis Date  . Diabetes mellitus type 1   . Hyperlipidemia   . Depression   . Umbilical hernia   . Hypertension   . Glaucoma   . CHF  (congestive heart failure)   . Heart attack 12/16/11  . Hypothyroidism   . CAD (coronary artery disease) of artery bypass graft 07/19/2012  . Hypoglycemia, unspecified   . Disorder of bone and cartilage, unspecified   . Acute posthemorrhagic anemia   . Anxiety state, unspecified   . Chronic pain syndrome   . Other primary cardiomyopathies   . Acute respiratory failure   . Dysphagia, oral phase   . Stress fracture of tibia or fibula   . Syncope and collapse    Past Surgical History  Procedure Laterality Date  . Appendectomy      open  . Tibia im nail insertion  12/18/2011    Procedure: INTRAMEDULLARY (IM) NAIL TIBIAL;  Surgeon: Eugenia Mcalpineobert Collins, MD;  Location: [facility];  Service: Orthopedics;  Laterality: Bilateral;  . Insertion of dialysis catheter  12/31/2011    Procedure: INSERTION OF DIALYSIS CATHETER;  Surgeon: Chuck Hinthristopher S Dickson, MD;  [facility];  Service: Vascular;  Laterality: N/A;  . Tonsillectomy  1980  . Fracture surgery  2012    wrist  . Fracture surgery  2013    bilateral tibia   [facility]  Physical exam BP 130/68  Pulse 71  Temp(Src) 98 F (36.7 C) (Oral)  Resp 16  Wt 170 lb (77.111 kg)  SpO2 97%  Constitutional: alert and oriented HENT:   Head: Normocephalic and atraumatic.   Mouth/Throat: Oropharynx is clear and moist.   Eyes: Conjunctivae  and EOM are normal. Pupils are dilated Nose: has maxillary sinus tenderness Neck: Normal range of motion. Neck supple. No JVD present. No thyromegaly present.   Cardiovascular: Normal rate, regular rhythm and normal heart sounds.    No murmur heard. Pulmonary/Chest: Effort normal and breath sounds normal. No respiratory distress. He has no wheezes.   Abdominal: Soft. Bowel sounds are normal. He exhibits no distension. There is no tenderness.  Musculoskeletal: Normal range of motion. He exhibits no edema and no tenderness.  Lymphadenopathy:    He has no cervical adenopathy.  Neurological: no new  focal defiict Skin: Skin is warm  Psychiatric: anxious and has mood swings   Lab Results  Component Value Date   HGBA1C 6.7* 08/15/2013   Check a1c today.continue lantus 25 u sq daily.    Lab Results  Component Value Date   TSH 29.570* 08/15/2013   Assessment/plan CAD He is going to making appointment with his heart doctor. He does not want [place] to make an appointment.  Left shoulder pain He has appointment with [facility] [place] on 01/23/14 for intra-articular injection  HYPOTHYROIDISM Continue levothyroxine 125 mcg daily - TSH - T3, Free  DIABETES MELLITUS, TYPE I Continue lantus 25 u daily. Advised not to use SSI. Diet and exercise encouraged. Continue aspirin with lipitor - Hemoglobin A1c - Basic Metabolic Panel  Nasal congestion Will have him on rhinocort nasal spray with this congestion likely being from the allergic rhinitis. If no improvement, will need to reassess for ? sinusitis  Anxiety state, unspecified Continue ativan 2 mg bid prn. Patient likes several months refill at once today, explained that this being a controlled substance, will provide it only on monthly basis  Of note: patient raised his voice and demanded anxiety medication, pain medication. Explained to the patient that pain medication will be provided by pain management clinic only, several clinic cant provide it. also patient got up from his seat and was angry when I explained to him about trying decongestants first for his rhinitis and the risk of hypoglycemia with self medication on insulin.

## 2014-01-04 ENCOUNTER — Other Ambulatory Visit: Payer: Medicare Other

## 2014-01-19 ENCOUNTER — Ambulatory Visit (INDEPENDENT_AMBULATORY_CARE_PROVIDER_SITE_OTHER): Payer: Medicare Other | Admitting: Endocrinology

## 2014-01-19 ENCOUNTER — Encounter: Payer: Self-pay | Admitting: Endocrinology

## 2014-01-19 VITALS — BP 120/64 | HR 83 | Temp 98.4°F | Ht 66.0 in | Wt 171.0 lb

## 2014-01-19 DIAGNOSIS — I25810 Atherosclerosis of coronary artery bypass graft(s) without angina pectoris: Secondary | ICD-10-CM

## 2014-01-19 DIAGNOSIS — E1029 Type 1 diabetes mellitus with other diabetic kidney complication: Secondary | ICD-10-CM

## 2014-01-19 DIAGNOSIS — I214 Non-ST elevation (NSTEMI) myocardial infarction: Secondary | ICD-10-CM

## 2014-01-19 DIAGNOSIS — E039 Hypothyroidism, unspecified: Secondary | ICD-10-CM

## 2014-01-19 LAB — TSH: TSH: 0.8 u[IU]/mL (ref 0.35–4.50)

## 2014-01-19 LAB — HEMOGLOBIN A1C: Hgb A1c MFr Bld: 7.8 % — ABNORMAL HIGH (ref 4.6–6.5)

## 2014-01-19 NOTE — Progress Notes (Signed)
Subjective:    Patient ID: Reginald FernsKevin J Purpura, male    DOB: February 04, 1966, 48 y.o.   MRN: [id number]  HPI Pt returns for f/u of type 1 DM (dx'ed 1969, when he presented with an uncertain type of acute illness; he has mild if any neuropathy of the lower extremities; he has associated renal failure, retinopathy, PAD, and CAD (refused CABG); he has required a simple qd insulin regimen; in late 2013, he had a episode of severe hypoglycemia).   He did not reduce the insulin as advised.  no cbg record, but states cbg's are well-controlled.  He has had 1 episode of severe hypoglycemia since last ov.  Sister gave glucagon.   Pt says he never misses the thyroid pill.   Past Medical History  Diagnosis Date  . Diabetes mellitus type 1   . Hyperlipidemia   . Depression   . Umbilical hernia   . Hypertension   . Glaucoma   . CHF (congestive heart failure)   . Heart attack 12/16/11  . Hypothyroidism   . CAD (coronary artery disease) of artery bypass graft 07/19/2012  . Hypoglycemia, unspecified   . Disorder of bone and cartilage, unspecified   . Acute posthemorrhagic anemia   . Anxiety state, unspecified   . Chronic pain syndrome   . Other primary cardiomyopathies   . Acute respiratory failure   . Dysphagia, oral phase   . Stress fracture of tibia or fibula   . Syncope and collapse     Past Surgical History  Procedure Laterality Date  . Appendectomy      open  . Tibia im nail insertion  12/18/2011    Procedure: INTRAMEDULLARY (IM) NAIL TIBIAL;  Surgeon: Eugenia Mcalpineobert Collins, MD;  Location: [facility];  Service: Orthopedics;  Laterality: Bilateral;  . Insertion of dialysis catheter  12/31/2011    Procedure: INSERTION OF DIALYSIS CATHETER;  Surgeon: Chuck Hinthristopher S Dickson, MD;  [facility];  Service: Vascular;  Laterality: N/A;  . Tonsillectomy  1980  . Fracture surgery  2012    wrist  . Fracture surgery  2013    bilateral tibia    History   Social History  . Marital Status: Single    Spouse Name: N/A     Number of Children: 0  . Years of Education: 16   Occupational History  . Sales- disabled    Social History Main Topics  . Smoking status: Current Every Day Smoker -- 1.00 packs/day    Types: Cigarettes  . Smokeless tobacco: Never Used  . Alcohol Use: No  . Drug Use: No     Comment: Past Cocaine abuse-2002-2003  . Sexual Activity: No   Other Topics Concern  . Not on file   Social History Narrative  . No narrative on file    Current Outpatient Prescriptions on File Prior to Visit  Medication Sig Dispense Refill  . ACCU-CHEK AVIVA PLUS test strip TEST 6 TIMES A DAY AS DIRECTED.  150 each  12  . aspirin 81 MG tablet Take 81 mg by mouth daily.      Marland Kitchen. atorvastatin (LIPITOR) 40 MG tablet take 1 tablet by mouth once daily  30 tablet  3  . budesonide (RHINOCORT AQUA) 32 MCG/ACT nasal spray Place 2 sprays into both nostrils daily.  8.6 g  0  . glucagon (GLUCAGON EMERGENCY) 1 MG injection Inject 1 mg into the muscle once as needed. Use as Directed for low blood sugar  3 each  5  .  glucose blood test strip TEST BLOOD SUGAR 6 TIMES A DAY AS INSTRUCTED BY DR Rudie Rikard  200 each  10  . HYDROcodone-acetaminophen (NORCO) 10-325 MG per tablet Take 1 tablet by mouth every 4 (four) hours as needed. recieves from pain management clinic. We do not dispnese this  30 tablet  0  . insulin glargine (LANTUS) 100 UNIT/ML injection Inject 25 Units into the skin at bedtime.       . INSULIN SYRINGE .5CC/29G 29G X 1/2" 0.5 ML MISC 1 Syringe by Does not apply route daily.  30 each  11  . levothyroxine (SYNTHROID, LEVOTHROID) 125 MCG tablet Take 1 tablet (125 mcg total) by mouth daily before breakfast.  30 tablet  11  . lisinopril (PRINIVIL,ZESTRIL) 40 MG tablet Take 0.5 tablets (20 mg total) by mouth daily.  15 tablet  4  . LORazepam (ATIVAN) 2 MG tablet take 1 tablet by mouth every 12 hours if needed for anxiety  60 tablet  0  . metoprolol tartrate (LOPRESSOR) 25 MG tablet Take 0.5 tablets (12.5 mg total) by  mouth 2 (two) times daily.  30 tablet  3  . Vitamin D, Ergocalciferol, (DRISDOL) 50000 UNITS CAPS capsule take 1 capsule by mouth every week  4 capsule  0  . ferrous sulfate 325 (65 FE) MG tablet Take 1 tablet (325 mg total) by mouth daily with breakfast.  30 tablet  11   No current facility-administered medications on file prior to visit.    Allergies  Allergen Reactions  . Vancomycin Other (See Comments)    Pt doesn't remember     No family history on file.  BP 120/64  Pulse 83  Temp(Src) 98.4 F (36.9 C) (Oral)  Ht 5\' 6"  (1.676 m)  Wt 171 lb (77.565 kg)  BMI 27.61 kg/m2  SpO2 94%  Review of Systems He denies hypoglycemia and weight change.      Objective:   Physical Exam VITAL SIGNS:  See vs page GENERAL: no distress Pulses: dorsalis pedis intact bilat, but decreased from normal.  Feet: no deformity. no ulcer on the feet. normal color and temp. no edema.  Neuro: sensation is intact to touch on the feet   Lab Results  Component Value Date   HGBA1C 7.8* 01/19/2014   Lab Results  Component Value Date   TSH 0.80 01/19/2014   T3TOTAL 65.0* 12/20/2011   T4TOTAL 5.2 08/15/2013      Assessment & Plan:  DM: moderate exacerbation Hypoglycemia: severe exacerbation Hypothyroidism: well-replaced   Patient Instructions  check your blood sugar 6 times a day--before the 3 meals, and at bedtime.  also check if you have symptoms of your blood sugar being too high or too low.  please keep a record of the readings and bring it to your next appointment here.  please call us sooner if your blood sugar goes below 70, or if it stays over 200. Please come back for a follow-up appointment in 4 months.   On this type of insulin schedule, you should eat meals on a regular schedule.  If a meal is missed or significantly delayed, your blood sugar could go low. Please reduce the lantus to 25 units each morning.  You don't need to take the regular insulin.  blood tests are being requested  for you today.  We'll contact you with results.

## 2014-01-19 NOTE — Patient Instructions (Addendum)
check your blood sugar 6 times a day--before the 3 meals, and at bedtime.  also check if you have symptoms of your blood sugar being too high or too low.  please keep a record of the readings and bring it to your next appointment here.  please call us sooner if your blood sugar goes below 70, or if it stays over 200. Please come back for a follow-up appointment in 4 months.   On this type of insulin schedule, you should eat meals on a regular schedule.  If a meal is missed or significantly delayed, your blood sugar could go low. Please reduce the lantus to 25 units each morning.  You don't need to take the regular insulin.  blood tests are being requested for you today.  We'll contact you with results.

## 2014-01-25 ENCOUNTER — Ambulatory Visit (INDEPENDENT_AMBULATORY_CARE_PROVIDER_SITE_OTHER): Payer: Medicare Other | Admitting: Interventional Cardiology

## 2014-01-25 ENCOUNTER — Encounter: Payer: Self-pay | Admitting: Interventional Cardiology

## 2014-01-25 VITALS — BP 142/72 | HR 56 | Ht 66.0 in | Wt 166.0 lb

## 2014-01-25 DIAGNOSIS — I10 Essential (primary) hypertension: Secondary | ICD-10-CM

## 2014-01-25 DIAGNOSIS — E7800 Pure hypercholesterolemia, unspecified: Secondary | ICD-10-CM | POA: Diagnosis not present

## 2014-01-25 DIAGNOSIS — I25810 Atherosclerosis of coronary artery bypass graft(s) without angina pectoris: Secondary | ICD-10-CM | POA: Diagnosis not present

## 2014-01-25 DIAGNOSIS — E039 Hypothyroidism, unspecified: Secondary | ICD-10-CM | POA: Diagnosis not present

## 2014-01-25 NOTE — Patient Instructions (Signed)
Your physician recommends that you continue on your current medications as directed. Please refer to the Current Medication list given to you today.  Your physician has requested that you have a lexiscan myoview. For further information please visit [email or web address]. Please follow instruction sheet, as given.  Your physician recommends that you schedule a follow-up appointment as needed  

## 2014-01-25 NOTE — Progress Notes (Signed)
Date: 01/25/2014 ID: Reginald Gutierrez, DOB [birth year], MRN [id number] PCP: Oneal Grout, MD  Reason: To establish the heart care  ASSESSMENT;  1. Severe diffuse three-vessel coronary artery disease. Myocardial infarction 2013. Diagnosed 2013 by coronary angiography. Patient refused surgery recommended by Dr. Hope Pigeon 2. Type 1 diabetes mellitus, poorly controlled 3. Chronic systolic heart failure, stable 4. Tobacco use 5. Hypertension 6. Anxiety/psychiatric problem  PLAN:  1. Pharmacologic myocardial perfusion study to reassess perfusion pattern and LV systolic function 2. Risk factor modification stressed. Smoking cessation, blood pressure control, weight loss, diabetes control, and physical activity/active lifestyle. 3. The patient is focusing on the desire to have bypass surgery, but at this time he has no cardiopulmonary complaints although he is very sedentary. I explained the futility of bypass surgery if he cannot control his risk factors and take medications as recommended.   SUBJECTIVE: Reginald Gutierrez is a 48 y.o. male who is here because he is afraid that his heart is in bad shape. He will stat bypass surgery as was recommended in  2013. He refused surgery at bedtime. He has become increasingly sedentary. He does not have angina. He denies dyspnea. He has exertional intolerance due to fatigue. He denies orthopnea, and PND. There is no peripheral edema. He has not had syncope.  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 He understands that smoking cessation is important but has never been able to do so. He takes his medications noncompliant lady. He is sedentary without any attempts at routine physical activity. He is frustrated by the fact that he has diabetes and and multiple other medical problems. He is angry that he has been diagnosed with coronary disease and can't believe that it is as severe as stated because he  has no symptoms.   Allergies  Allergen Reactions  . Vancomycin Other (See Comments)    Pt doesn't remember     Current Outpatient Prescriptions on File Prior to Visit  Medication Sig Dispense Refill  . ACCU-CHEK AVIVA PLUS test strip TEST 6 TIMES A DAY AS DIRECTED.  150 each  12  . aspirin 81 MG tablet Take 81 mg by mouth daily.      Marland Kitchen atorvastatin (LIPITOR) 40 MG tablet take 1 tablet by mouth once daily  30 tablet  3  . budesonide (RHINOCORT AQUA) 32 MCG/ACT nasal spray Place 2 sprays into both nostrils daily.  8.6 g  0  . glucagon (GLUCAGON EMERGENCY) 1 MG injection Inject 1 mg into the muscle once as needed. Use as Directed for low blood sugar  3 each  5  . glucose blood test strip TEST BLOOD SUGAR 6 TIMES A DAY AS INSTRUCTED BY DR ELLISON  200 each  10  . HYDROcodone-acetaminophen (NORCO) 10-325 MG per tablet Take 1 tablet by mouth every 4 (four) hours as needed. recieves from pain management clinic. We do not dispnese this  30 tablet  0  . insulin glargine (LANTUS) 100 UNIT/ML injection Inject 25 Units into the skin at bedtime.       . INSULIN SYRINGE .5CC/29G 29G X 1/2" 0.5 ML MISC 1 Syringe by Does not apply route daily.  30 each  11  . levothyroxine (SYNTHROID, LEVOTHROID) 125 MCG tablet Take 1 tablet (125 mcg total) by mouth daily before breakfast.  30 tablet  11  . lisinopril (PRINIVIL,ZESTRIL) 40 MG tablet Take 0.5 tablets (20 mg total) by mouth daily.  15 tablet  4  . LORazepam (ATIVAN) 2 MG tablet take 1 tablet by mouth every 12 hours if needed for anxiety  60 tablet  0  . metoprolol tartrate (LOPRESSOR) 25 MG tablet Take 0.5 tablets (12.5 mg total) by mouth 2 (two) times daily.  30 tablet  3  . Vitamin D, Ergocalciferol, (DRISDOL) 50000 UNITS CAPS capsule take 1 capsule by mouth every week  4 capsule  0  . ferrous sulfate 325 (65 FE) MG tablet Take 1 tablet (325 mg total) by mouth daily with breakfast.  30 tablet  11   No current facility-administered medications on file  prior to visit.    Past Medical History  Diagnosis Date  . Diabetes mellitus type 1   . Hyperlipidemia   . Depression   . Umbilical hernia   . Hypertension   . Glaucoma   . CHF (congestive heart failure)   . Heart attack 12/16/11  . Hypothyroidism   . CAD (coronary artery disease) of artery bypass graft 07/19/2012  . Hypoglycemia, unspecified   . Disorder of bone and cartilage, unspecified   . Acute posthemorrhagic anemia   . Anxiety state, unspecified   . Chronic pain syndrome   . Other primary cardiomyopathies   . Acute respiratory failure   . Dysphagia, oral phase   . Stress fracture of tibia or fibula   . Syncope and collapse     Past Surgical History  Procedure Laterality Date  . Appendectomy      open  . Tibia im nail insertion  12/18/2011    Procedure: INTRAMEDULLARY (IM) NAIL TIBIAL;  Surgeon: Eugenia Mcalpineobert Collins, MD;  Location: [facility];  Service: Orthopedics;  Laterality: Bilateral;  . Insertion of dialysis catheter  12/31/2011    Procedure: INSERTION OF DIALYSIS CATHETER;  Surgeon: Chuck Hinthristopher S Dickson, MD;  [facility];  Service: Vascular;  Laterality: N/A;  . Tonsillectomy  1980  . Fracture surgery  2012    wrist  . Fracture surgery  2013    bilateral tibia    History   Social History  . Marital Status: Single    Spouse Name: N/A    Number of Children: 0  . Years of Education: 16   Occupational History  . Sales- disabled    Social History Main Topics  . Smoking status: Current Some Day Smoker -- 1.00 packs/day    Types: Cigarettes  . Smokeless tobacco: Never Used  . Alcohol Use: No  . Drug Use: No     Comment: Past Cocaine abuse-2002-2003  . Sexual Activity: No   Other Topics Concern  . Not on file   Social History Narrative  . No narrative on file    Family History  Problem Relation Age of Onset  . Heart disease Father     ROS: Difficulty sleeping. Chronic shoulder pain. Chronic pain syndrome requiring opiates. Continues to smoke  cigarettes. Anger management issues.. Other systems negative for complaints.  OBJECTIVE: BP 142/72  Pulse 56  Ht 5\' 6"  (1.676 m)  Wt 166 lb (75.297 kg)  BMI 26.81 kg/m2,  General: No acute distress, obesity HEENT: normal with no evidence of pallor or jaundice Neck: JVD flat. Carotids absent bruits Chest: Clear Cardiac: Murmur: 1-2 of 6 apical systolic murmur. Gallop: S4 gallop. Rhythm: Normal. Other: Normal Abdomen: Bruit: Absent. Pulsation: Absent Extremities: Edema: Trace edema. Pulses: 2+ and symmetric Neuro: Anxious and aggressive. No focal deficit  Psych: Agitated at times  ECG: Normal when recently performed

## 2014-01-26 ENCOUNTER — Other Ambulatory Visit: Payer: Self-pay | Admitting: Endocrinology

## 2014-01-26 NOTE — Telephone Encounter (Signed)
Please advise if ok to refill. Med is not on pt's current med list.  Thanks!

## 2014-01-31 ENCOUNTER — Ambulatory Visit (HOSPITAL_COMMUNITY): Payer: Medicare Other | Attending: Cardiology | Admitting: Radiology

## 2014-01-31 VITALS — BP 145/79 | Ht 66.0 in | Wt 171.0 lb

## 2014-01-31 DIAGNOSIS — R079 Chest pain, unspecified: Secondary | ICD-10-CM | POA: Insufficient documentation

## 2014-01-31 DIAGNOSIS — I25810 Atherosclerosis of coronary artery bypass graft(s) without angina pectoris: Secondary | ICD-10-CM

## 2014-01-31 DIAGNOSIS — R0989 Other specified symptoms and signs involving the circulatory and respiratory systems: Secondary | ICD-10-CM | POA: Insufficient documentation

## 2014-01-31 DIAGNOSIS — R5381 Other malaise: Secondary | ICD-10-CM | POA: Insufficient documentation

## 2014-01-31 DIAGNOSIS — R002 Palpitations: Secondary | ICD-10-CM | POA: Insufficient documentation

## 2014-01-31 DIAGNOSIS — R0602 Shortness of breath: Secondary | ICD-10-CM

## 2014-01-31 DIAGNOSIS — R42 Dizziness and giddiness: Secondary | ICD-10-CM | POA: Insufficient documentation

## 2014-01-31 DIAGNOSIS — Z951 Presence of aortocoronary bypass graft: Secondary | ICD-10-CM | POA: Insufficient documentation

## 2014-01-31 DIAGNOSIS — R0609 Other forms of dyspnea: Secondary | ICD-10-CM | POA: Insufficient documentation

## 2014-01-31 DIAGNOSIS — I252 Old myocardial infarction: Secondary | ICD-10-CM | POA: Insufficient documentation

## 2014-01-31 DIAGNOSIS — I2510 Atherosclerotic heart disease of native coronary artery without angina pectoris: Secondary | ICD-10-CM | POA: Insufficient documentation

## 2014-01-31 DIAGNOSIS — R5383 Other fatigue: Secondary | ICD-10-CM

## 2014-01-31 MED ORDER — TECHNETIUM TC 99M SESTAMIBI GENERIC - CARDIOLITE
10.0000 | Freq: Once | INTRAVENOUS | Status: AC | PRN
  Administered 2014-01-31: 10 via INTRAVENOUS

## 2014-01-31 MED ORDER — TECHNETIUM TC 99M SESTAMIBI GENERIC - CARDIOLITE
30.0000 | Freq: Once | INTRAVENOUS | Status: AC | PRN
  Administered 2014-01-31: 30 via INTRAVENOUS

## 2014-01-31 MED ORDER — REGADENOSON 0.4 MG/5ML IV SOLN
0.4000 mg | Freq: Once | INTRAVENOUS | Status: AC
  Administered 2014-01-31: 0.4 mg via INTRAVENOUS

## 2014-01-31 NOTE — Progress Notes (Signed)
[facility] SITE 3 NUCLEAR MED [address] [place], [place] [address] [phone]    Cardiology Nuclear Med Study  Reginald Gutierrez is a 48 y.o. male     MRN : [id number]     DOB: 03-21-1966  Procedure Date: 01/31/2014  Nuclear Med Background Indication for Stress Test:  Evaluation for Ischemia History:  CAD-MI-CATH-Pt. Refused CABG-2013 ECHO: EF: 40-45% MPI: Ischemia Scar Inferior wall ischemia minimal apical ischemia Cardiac Risk Factors: CVA, Family History - CAD, Hypertension, IDDM, Lipids and Smoker  Symptoms:  Chest Pain, DOE, Fatigue, Light-Headedness and Palpitations   Nuclear Pre-Procedure Caffeine/Decaff Intake:  10:00pm NPO After: 11:00pm   Lungs:  clear O2 Sat: 96% on room air. IV 0.9% NS with Angio Cath:  22g  IV Site: R Wrist x 1, tolerated well IV Started by:  Irean Hong, RN  Chest Size (in):  44 Cup Size: n/a  Height: 5\' 6"  (1.676 m)  Weight:  171 lb (77.565 kg)  BMI:  Body mass index is 27.61 kg/(m^2). Tech Comments:  Fasting CBG was 149 at 0600 this am. No Insulin taken this am per patient. Patient took Lopressor this am. Irean Hong, RN. Note:  835 am  Patient unresponsive on camera BS-18, 25G Glucose given, Dr. Myrtis Ser notified. 850 am- BS 195, patient alert. Bonnita Levan, RN    Nuclear Med Study 1 or 2 day study: 1 day  Stress Test Type:  Eugenie Birks  Reading MD: N/A  Order Authorizing Provider:  Verdis Prime, MD  Resting Radionuclide: Technetium 69m Sestamibi  Resting Radionuclide Dose: 11.0 mCi   Stress Radionuclide:  Technetium 65m Sestamibi  Stress Radionuclide Dose: 33.0 mCi           Stress Protocol Rest HR: 72 Stress HR: 93  Rest BP: 145/79 Stress BP: 156/98  Exercise Time (min): n/a METS: n/a   Predicted Max HR: 173 bpm % Max HR: 53.76 bpm Rate Pressure Product: 49753   Dose of Adenosine (mg):  n/a Dose of Lexiscan: 0.4 mg  Dose of Atropine (mg): n/a Dose of Dobutamine: n/a mcg/kg/min (at max HR)  Stress Test Technologist: Milana Na, EMT-P  Nuclear Technologist:  Doyne Keel, CNMT     Rest Procedure:  Myocardial perfusion imaging was performed at rest 45 minutes following the intravenous administration of Technetium 59m Sestamibi. Rest ECG: NSR with non-specific ST-T wave changes.  Frequent PVCs at rest and with stress. Study not gated.  Stress Procedure:  The patient received IV Lexiscan 0.4 mg over 15-seconds.  Technetium 45m Sestamibi injected at 30-seconds. This patient had sob and  Chest discomfort with the Lexiscan injection. Quantitative spect images were obtained after a 45 minute delay. Stress ECG: No significant change from baseline ECG  QPS Raw Data Images:  Normal; no motion artifact; normal heart/lung ratio. Stress Images:  There is decreased uptake in the apical septal and in the inferolateral regions. Rest Images:  Comparison with the stress images reveals no significant change. There is partial reversibility in the inferolateral area. Subtraction (SDS):  There is a fixed defect that is most consistent with a previous infarction with partial reversibility. Transient Ischemic Dilatation (Normal <1.22):  0.95 Lung/Heart Ratio (Normal <0.45):  0.48  Quantitative Gated Spect Images QGS EDV:  NA QGS ESV:  NA  Impression Exercise Capacity:  Lexiscan with no exercise. BP Response:  Normal blood pressure response. Clinical Symptoms:  Mild chest pain/dyspnea. ECG Impression:  Baseline EKG shows nonspecific ST changes which are unchanged with stress. Comparison  with Prior Nuclear Study: Since prior myoview of 2013, apical septal ischemia is new.  Overall Impression:  Abnormal study.  There is a small partially reversible defect in the apical septal segment. There is a medium size defect of moderate severity involving the apical inferior and midinferior and midinferolateral segments with partial reversibility. Patient experience chest pain during lexiscan infusion. Patient was treated for hypoglycemia  (BS 18) during recovery. Frequent PVCs throughout test.  LV Ejection Fraction: Study not gated.  LV Wall Motion:  NA  Cassell Clementhomas Michaiah Holsopple MD

## 2014-02-07 ENCOUNTER — Other Ambulatory Visit: Payer: Self-pay | Admitting: Interventional Cardiology

## 2014-02-07 ENCOUNTER — Encounter (HOSPITAL_COMMUNITY): Payer: Medicare Other

## 2014-02-07 ENCOUNTER — Telehealth: Payer: Self-pay

## 2014-02-07 DIAGNOSIS — I359 Nonrheumatic aortic valve disorder, unspecified: Secondary | ICD-10-CM

## 2014-02-07 DIAGNOSIS — R9439 Abnormal result of other cardiovascular function study: Secondary | ICD-10-CM

## 2014-02-07 DIAGNOSIS — I2510 Atherosclerotic heart disease of native coronary artery without angina pectoris: Secondary | ICD-10-CM

## 2014-02-07 DIAGNOSIS — Z01812 Encounter for preprocedural laboratory examination: Secondary | ICD-10-CM

## 2014-02-07 NOTE — Telephone Encounter (Signed)
called to give pt lexiscan results and Dr.Smith recommnedations.Nuc is moderate risk. He needs an echo to assess LV since they were unable to gate nuclear study and therefore no info on EF.   I would say that he needs repeat cath then CABG. pt sts that he would like to proceed with surgery. pt sts that he would like first available cath date.adv pt he will need pre procedure labs and a chest xray.adv pt I will get cath sch and call him back with date, time, and instructions.pt verbalized understanding        Pt aware cath sch with Dr.Smith for 02/13/14 @ 7:30sm. Pt given verbal instructions over the phone.written instructions mailed to pt.pt will have preprocedure lab and chest xray on 02/12/14.pt agreeable with plan and verbalized understanding

## 2014-02-12 ENCOUNTER — Telehealth: Payer: Self-pay | Admitting: *Deleted

## 2014-02-12 ENCOUNTER — Other Ambulatory Visit (INDEPENDENT_AMBULATORY_CARE_PROVIDER_SITE_OTHER): Payer: Medicare Other

## 2014-02-12 ENCOUNTER — Other Ambulatory Visit: Payer: Self-pay | Admitting: Endocrinology

## 2014-02-12 ENCOUNTER — Other Ambulatory Visit: Payer: Self-pay | Admitting: Internal Medicine

## 2014-02-12 ENCOUNTER — Ambulatory Visit
Admission: RE | Admit: 2014-02-12 | Discharge: 2014-02-12 | Disposition: A | Discharge status: Home / Self Care | Payer: Medicaid Other | Source: Ambulatory Visit | Attending: Interventional Cardiology | Admitting: Interventional Cardiology

## 2014-02-12 ENCOUNTER — Encounter (HOSPITAL_COMMUNITY): Payer: Self-pay | Admitting: Pharmacy Technician

## 2014-02-12 DIAGNOSIS — R9439 Abnormal result of other cardiovascular function study: Secondary | ICD-10-CM

## 2014-02-12 DIAGNOSIS — I2510 Atherosclerotic heart disease of native coronary artery without angina pectoris: Secondary | ICD-10-CM

## 2014-02-12 DIAGNOSIS — Z01812 Encounter for preprocedural laboratory examination: Secondary | ICD-10-CM

## 2014-02-12 LAB — CBC WITH DIFFERENTIAL/PLATELET
BASOS ABS: 0 10*3/uL (ref 0.0–0.1)
Basophils Relative: 0.5 % (ref 0.0–3.0)
Eosinophils Absolute: 0.2 10*3/uL (ref 0.0–0.7)
Eosinophils Relative: 2.5 % (ref 0.0–5.0)
HCT: 42.3 % (ref 39.0–52.0)
Hemoglobin: 14.1 g/dL (ref 13.0–17.0)
LYMPHS PCT: 30.1 % (ref 12.0–46.0)
Lymphs Abs: 2.7 10*3/uL (ref 0.7–4.0)
MCHC: 33.4 g/dL (ref 30.0–36.0)
MCV: 90.4 fl (ref 78.0–100.0)
MONOS PCT: 7.6 % (ref 3.0–12.0)
Monocytes Absolute: 0.7 10*3/uL (ref 0.1–1.0)
Neutro Abs: 5.4 10*3/uL (ref 1.4–7.7)
Neutrophils Relative %: 59.3 % (ref 43.0–77.0)
PLATELETS: 320 10*3/uL (ref 150.0–400.0)
RBC: 4.68 Mil/uL (ref 4.22–5.81)
RDW: 15.1 % (ref 11.5–15.5)
WBC: 9.1 10*3/uL (ref 4.0–10.5)

## 2014-02-12 LAB — PROTIME-INR
INR: 1 ratio (ref 0.8–1.0)
Prothrombin Time: 11.2 s (ref 9.6–13.1)

## 2014-02-12 LAB — BASIC METABOLIC PANEL
BUN: 16 mg/dL (ref 6–23)
CALCIUM: 9.3 mg/dL (ref 8.4–10.5)
CO2: 24 mEq/L (ref 19–32)
CREATININE: 0.8 mg/dL (ref 0.4–1.5)
Chloride: 109 mEq/L (ref 96–112)
GFR: 105.18 mL/min (ref >60.00)
Glucose, Bld: 28 mg/dL — CL (ref 70–99)
Potassium: 3.7 mEq/L (ref 3.5–5.1)
Sodium: 141 mEq/L (ref 135–145)

## 2014-02-12 NOTE — Telephone Encounter (Signed)
Tonya from the lab called critical blood sugar level this am of 28  Pt states he felt like he was low when he got back to his car this morning after lab work. States he took a glucose gel & rechecked his blood sugar later ---reading was 78 per pt  Pt was upset that he came in at 0700 for lab work and had to wait until 0800 He was not happy I had called to check on him. He was not happy when I told him he did not have to be fasting for the lab work he had this morning.  He then, hung on me. Mylo Red RN

## 2014-02-13 ENCOUNTER — Ambulatory Visit (HOSPITAL_COMMUNITY)
Admission: RE | Admit: 2014-02-13 | Discharge: 2014-02-13 | Disposition: A | Discharge status: Home / Self Care | Payer: Medicare Other | Source: Ambulatory Visit | Attending: Interventional Cardiology | Admitting: Interventional Cardiology

## 2014-02-13 ENCOUNTER — Other Ambulatory Visit: Payer: Self-pay | Admitting: *Deleted

## 2014-02-13 ENCOUNTER — Encounter (HOSPITAL_COMMUNITY)
Admission: RE | Disposition: A | Discharge status: Home / Self Care | Payer: Self-pay | Source: Ambulatory Visit | Attending: Interventional Cardiology

## 2014-02-13 DIAGNOSIS — F3289 Other specified depressive episodes: Secondary | ICD-10-CM | POA: Insufficient documentation

## 2014-02-13 DIAGNOSIS — E039 Hypothyroidism, unspecified: Secondary | ICD-10-CM | POA: Insufficient documentation

## 2014-02-13 DIAGNOSIS — F17200 Nicotine dependence, unspecified, uncomplicated: Secondary | ICD-10-CM | POA: Insufficient documentation

## 2014-02-13 DIAGNOSIS — I252 Old myocardial infarction: Secondary | ICD-10-CM | POA: Insufficient documentation

## 2014-02-13 DIAGNOSIS — E109 Type 1 diabetes mellitus without complications: Secondary | ICD-10-CM | POA: Insufficient documentation

## 2014-02-13 DIAGNOSIS — I509 Heart failure, unspecified: Secondary | ICD-10-CM | POA: Insufficient documentation

## 2014-02-13 DIAGNOSIS — H409 Unspecified glaucoma: Secondary | ICD-10-CM | POA: Insufficient documentation

## 2014-02-13 DIAGNOSIS — F609 Personality disorder, unspecified: Secondary | ICD-10-CM | POA: Insufficient documentation

## 2014-02-13 DIAGNOSIS — F411 Generalized anxiety disorder: Secondary | ICD-10-CM | POA: Insufficient documentation

## 2014-02-13 DIAGNOSIS — F329 Major depressive disorder, single episode, unspecified: Secondary | ICD-10-CM | POA: Insufficient documentation

## 2014-02-13 DIAGNOSIS — I25810 Atherosclerosis of coronary artery bypass graft(s) without angina pectoris: Secondary | ICD-10-CM

## 2014-02-13 DIAGNOSIS — Z7982 Long term (current) use of aspirin: Secondary | ICD-10-CM | POA: Insufficient documentation

## 2014-02-13 DIAGNOSIS — Z794 Long term (current) use of insulin: Secondary | ICD-10-CM | POA: Insufficient documentation

## 2014-02-13 DIAGNOSIS — D649 Anemia, unspecified: Secondary | ICD-10-CM | POA: Insufficient documentation

## 2014-02-13 DIAGNOSIS — I428 Other cardiomyopathies: Secondary | ICD-10-CM | POA: Insufficient documentation

## 2014-02-13 DIAGNOSIS — K429 Umbilical hernia without obstruction or gangrene: Secondary | ICD-10-CM | POA: Insufficient documentation

## 2014-02-13 DIAGNOSIS — E785 Hyperlipidemia, unspecified: Secondary | ICD-10-CM | POA: Insufficient documentation

## 2014-02-13 DIAGNOSIS — I10 Essential (primary) hypertension: Secondary | ICD-10-CM | POA: Insufficient documentation

## 2014-02-13 DIAGNOSIS — I5022 Chronic systolic (congestive) heart failure: Secondary | ICD-10-CM | POA: Insufficient documentation

## 2014-02-13 DIAGNOSIS — I2510 Atherosclerotic heart disease of native coronary artery without angina pectoris: Secondary | ICD-10-CM

## 2014-02-13 DIAGNOSIS — I359 Nonrheumatic aortic valve disorder, unspecified: Secondary | ICD-10-CM

## 2014-02-13 HISTORY — PX: LEFT HEART CATHETERIZATION WITH CORONARY ANGIOGRAM: SHX5451

## 2014-02-13 LAB — GLUCOSE, CAPILLARY
GLUCOSE-CAPILLARY: 163 mg/dL — AB (ref 70–99)
Glucose-Capillary: 168 mg/dL — ABNORMAL HIGH (ref 70–99)
Glucose-Capillary: 20 mg/dL — CL (ref 70–99)
Glucose-Capillary: 144 mg/dL — ABNORMAL HIGH (ref 70–99)
Glucose-Capillary: 69 mg/dL — ABNORMAL LOW (ref 70–99)

## 2014-02-13 SURGERY — LEFT HEART CATHETERIZATION WITH CORONARY ANGIOGRAM
Anesthesia: LOCAL

## 2014-02-13 MED ORDER — MIDAZOLAM HCL 2 MG/2ML IJ SOLN
INTRAMUSCULAR | Status: AC
  Filled 2014-02-13: qty 2

## 2014-02-13 MED ORDER — ASPIRIN 81 MG PO CHEW: 81.0000 mg | CHEWABLE_TABLET | ORAL | Status: DC

## 2014-02-13 MED ORDER — LORAZEPAM 1 MG PO TABS
2.0000 mg | ORAL_TABLET | Freq: Once | ORAL | Status: DC
  Filled 2014-02-13: qty 2

## 2014-02-13 MED ORDER — HYDROCODONE-ACETAMINOPHEN 5-325 MG PO TABS
1.0000 | ORAL_TABLET | Freq: Four times a day (QID) | ORAL | Status: DC
  Filled 2014-02-13: qty 2

## 2014-02-13 MED ORDER — SODIUM CHLORIDE 0.9 % IV SOLN: INTRAVENOUS | Status: DC

## 2014-02-13 MED ORDER — FENTANYL CITRATE 0.05 MG/ML IJ SOLN
INTRAMUSCULAR | Status: AC
  Filled 2014-02-13: qty 2

## 2014-02-13 MED ORDER — OXYCODONE HCL 5 MG PO TABS
15.0000 mg | ORAL_TABLET | Freq: Once | ORAL | Status: AC
Start: 2014-02-13 — End: 2014-02-13
  Administered 2014-02-13: 15 mg via ORAL
  Filled 2014-02-13: qty 3

## 2014-02-13 MED ORDER — SODIUM CHLORIDE 0.9 % IV SOLN: 250.0000 mL | INTRAVENOUS | Status: DC | PRN

## 2014-02-13 MED ORDER — DEXTROSE 50 % IV SOLN: 50.0000 mL | Freq: Once | INTRAVENOUS | Status: DC | PRN

## 2014-02-13 MED ORDER — INSULIN REGULAR HUMAN 100 UNIT/ML IJ SOLN: 0.0000 [IU] | Freq: Four times a day (QID) | INTRAMUSCULAR | Status: DC | PRN

## 2014-02-13 MED ORDER — DEXTROSE 50 % IV SOLN
INTRAVENOUS | Status: AC
  Administered 2014-02-13: 50 mL
  Filled 2014-02-13: qty 50

## 2014-02-13 MED ORDER — SODIUM CHLORIDE 0.9 % IJ SOLN: 3.0000 mL | INTRAMUSCULAR | Status: DC | PRN

## 2014-02-13 MED ORDER — VERAPAMIL HCL 2.5 MG/ML IV SOLN
INTRAVENOUS | Status: AC
  Filled 2014-02-13: qty 2

## 2014-02-13 MED ORDER — HEPARIN (PORCINE) IN NACL 2-0.9 UNIT/ML-% IJ SOLN
INTRAMUSCULAR | Status: AC
  Filled 2014-02-13: qty 1500

## 2014-02-13 MED ORDER — HEPARIN SODIUM (PORCINE) 1000 UNIT/ML IJ SOLN
INTRAMUSCULAR | Status: AC
  Filled 2014-02-13: qty 1

## 2014-02-13 MED ORDER — LIDOCAINE HCL (PF) 1 % IJ SOLN
INTRAMUSCULAR | Status: AC
  Filled 2014-02-13: qty 30

## 2014-02-13 MED ORDER — SODIUM CHLORIDE 0.9 % IJ SOLN: 3.0000 mL | Freq: Two times a day (BID) | INTRAMUSCULAR | Status: DC

## 2014-02-13 NOTE — Progress Notes (Signed)
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

## 2014-02-13 NOTE — Discharge Instructions (Signed)
Radial Site Care °Refer to this sheet in the next few weeks. These instructions provide you with information on caring for yourself after your procedure. Your caregiver may also give you more specific instructions. Your treatment has been planned according to current medical practices, but problems sometimes occur. Call your caregiver if you have any problems or questions after your procedure. °HOME CARE INSTRUCTIONS °· You may shower the day after the procedure. Remove the bandage (dressing) and gently wash the site with plain soap and water. Gently pat the site dry. °· Do not apply powder or lotion to the site. °· Do not submerge the affected site in water for 3 to 5 days. °· Inspect the site at least twice daily. °· Do not flex or bend the affected arm for 24 hours. °· No lifting over 5 pounds (2.3 kg) for 5 days after your procedure. °· Do not drive home if you are discharged the same day of the procedure. Have someone else drive you. °· You may drive 24 hours after the procedure unless otherwise instructed by your caregiver. °· Do not operate machinery or power tools for 24 hours. °· A responsible adult should be with you for the first 24 hours after you arrive home. °What to expect: °· Any bruising will usually fade within 1 to 2 weeks. °· Blood that collects in the tissue (hematoma) may be painful to the touch. It should usually decrease in size and tenderness within 1 to 2 weeks. °SEEK IMMEDIATE MEDICAL CARE IF: °· You have unusual pain at the radial site. °· You have redness, warmth, swelling, or pain at the radial site. °· You have drainage (other than a small amount of blood on the dressing). °· You have chills. °· You have a fever or persistent symptoms for more than 72 hours. °· You have a fever and your symptoms suddenly get worse. °· Your arm becomes pale, cool, tingly, or numb. °· You have heavy bleeding from the site. Hold pressure on the site. °Document Released: 09/19/2010 Document Revised:  11/09/2011 Document Reviewed: 09/19/2010 °[employer]® Patient Information ©2014 [employer]. ° ° °Angiography, Care After °Refer to this sheet in the next few weeks. These instructions provide you with information on caring for yourself after your procedure. Your health care provider may also give you more specific instructions. Your treatment has been planned according to current medical practices, but problems sometimes occur. Call your health care provider if you have any problems or questions after your procedure.  °WHAT TO EXPECT AFTER THE PROCEDURE °After your procedure, it is typical to have the following sensations: °· Minor discomfort or tenderness and a small bump at the catheter insertion site. The bump should usually decrease in size and tenderness within 1 to 2 weeks. °· Any bruising will usually fade within 2 to 4 weeks. °HOME CARE INSTRUCTIONS  °· You may need to keep taking blood thinners if they were prescribed for you. Only take over-the-counter or prescription medicines for pain, fever, or discomfort as directed by your health care provider. °· Do not apply powder or lotion to the site. °· Do not sit in a bathtub, swimming pool, or whirlpool for 5 to 7 days. °· You may shower 24 hours after the procedure. Remove the bandage (dressing) and gently wash the site with plain soap and water. Gently pat the site dry. °· Inspect the site at least twice daily. °· Limit your activity for the first 48 hours. Do not bend, squat, or lift anything over 20 lb (  9 kg) or as directed by your health care provider.  Do not drive home if you are discharged the day of the procedure. Have someone else drive you. Follow instructions about when you can drive or return to work. SEEK MEDICAL CARE IF:  You get lightheaded when standing up.  You have drainage (other than a small amount of blood on the dressing).  You have chills.  You have a fever.  You have redness, warmth, swelling, or pain at the insertion  site. SEEK IMMEDIATE MEDICAL CARE IF:   You develop chest pain or shortness of breath, feel faint, or pass out.  You have bleeding, swelling larger than a walnut, or drainage from the catheter insertion site.  You develop pain, discoloration, coldness, or severe bruising in the leg or arm that held the catheter.  You have heavy bleeding from the site. If this happens, hold pressure on the site and call 911. MAKE SURE YOU:  Understand these instructions.  Will watch your condition.  Will get help right away if you are not doing well or get worse. Document Released: 03/05/2005 Document Revised: 04/19/2013 Document Reviewed: 01/09/2013 [employer] Patient Information 2014 [place], [place].

## 2014-02-13 NOTE — Interval H&P Note (Signed)
Cath Lab Visit (complete for each Cath Lab visit)  Clinical Evaluation Leading to the Procedure:   ACS: no  Non-ACS:    Anginal Classification: CCS III  Anti-ischemic medical therapy: Maximal Therapy (2 or more classes of medications)  Non-Invasive Test Results: High-risk stress test findings: cardiac mortality >3%/year  Prior CABG: No previous CABG      History and Physical Interval Note:  02/13/2014 7:49 AM  Reginald Gutierrez  has presented today for surgery, with the diagnosis of Chest Pain  The various methods of treatment have been discussed with the patient and family. After consideration of risks, benefits and other options for treatment, the patient has consented to  Procedure(s): LEFT HEART CATHETERIZATION WITH CORONARY ANGIOGRAM (N/A) as a surgical intervention .  The patient's history has been reviewed, patient examined, no change in status, stable for surgery.  I have reviewed the patient's chart and labs.  Questions were answered to the patient's satisfaction.     [name]

## 2014-02-13 NOTE — CV Procedure (Signed)
     Left Heart Catheterization with Coronary Angiography Report  Reginald Gutierrez  48 y.o.  male 08-Jun-1966  Procedure Date: 02/13/2014 Referring Physician: Cherrie Gauze Leia Alf, M.D. Primary Cardiologist::  Patty Sermons, M.D.  INDICATIONS: Known three-vessel coronary disease by catheterization greater than one year ago. Progressive symptoms, intermediate to high risk myocardial perfusion study, and diabetes mellitus.  PROCEDURE: 1. Left heart catheterization; 2. Left ventriculography; 3. Coronary angina  CONSENT:  The risks, benefits, and details of the procedure were explained in detail to the patient. Risks including death, stroke, heart attack, kidney injury, allergy, limb ischemia, bleeding and radiation injury were discussed.  The patient verbalized understanding and wanted to proceed.  Informed written consent was obtained.  PROCEDURE TECHNIQUE:  After Xylocaine anesthesia a 5 French slender sheath was placed in the right radial artery with an angiocath and the modified Seldinger technique.  Coronary angiography was done using a 5 F JL 3.5 and JR 4 catheter.  We will unable to cannulate the left coronary with the JL and used multiple other catheters including a 3.0 EBU, a 3.5 EBU, 3.0 CLS, 4.0 TIG, 3.0 Easy Rad left. We ultimately converted to the right femoral approach to achieve left coronary angiography with a 5 French A2 MP. Left ventriculography was done using the JR 4 catheter and hand injection.   The patient received high-dose Versed and fentanyl, still resulting in an adequate sedation. The patient was moving about, talking, and breathing erratically.   CONTRAST:  Total of 150 cc.  COMPLICATIONS:  Unable to cannulate the left coronary system with cath is as noted above resulting in right femoral approach. Difficulty was entry of the innominate artery from the distal half of the aortic arch and subsequent difficulty with cath and advancement down the descending aorta when attempting to  cannulate the left coronary.   HEMODYNAMICS:  Aortic pressure 112/57 mmHg; LV pressure 115/19 mmHg; LVEDP 27 mm mercury   ANGIOGRAPHIC DATA:   The left main coronary artery is widely patent.  The left anterior descending artery is severely diseased proximal to mid disease with up to 80-90% stenosis of a segmental fashion. The the mid to distal LAD is patent and graftable. First and second diagonal ostial stenoses are the second diagonal is graftable containing an 80% stenosis.  The left circumflex artery is nondominant. 2 obtuse marginal branches arise from the circumflex. The first branch arises very proximal and contains diffuse 90% stenosis in the proximal third. The distal vessel is graftable. The circumflex beyond the origin of the first obtuse marginal contains 80-90% obstruction and a small second obtuse marginal is free of any significant obstruction..  The right coronary artery is patent but receives left to right collaterals from both the LAD and circumflex. 2 large left ventricular branches are visualized. The PDA is faintly visualized. The right coronary is a large vessel. It contains 99% distal obstruction with faint antegrade flow. There is 80% obstruction in the second acute marginal branch. Right to right collaterals are noted to the PDA. The PDA is severely and diffusely diseased in the proximal third. The distal vessel is graftable.Marland Kitchen   LEFT VENTRICULOGRAM:  Left ventricular angiogram was done in the 30 RAO projection and revealed severe inferior hypokinesis. Estimated ejection fraction 35%.   IMPRESSIONS:  1. Severe three-vessel coronary disease involving the proximal to mid LAD, the first obtuse marginal , and the distal right coronary.  2. Left ventricular systolic dysfunction. EF 35%   RECOMMENDATION:  CABG.

## 2014-02-13 NOTE — H&P (View-Only) (Signed)
Date: 01/25/2014 ID: Reginald Gutierrez, DOB [birth year], MRN [id number] PCP: Oneal Grout, MD  Reason: To establish the heart care  ASSESSMENT;  1. Severe diffuse three-vessel coronary artery disease. Myocardial infarction 2013. Diagnosed 2013 by coronary angiography. Patient refused surgery recommended by Dr. Hope Pigeon 2. Type 1 diabetes mellitus, poorly controlled 3. Chronic systolic heart failure, stable 4. Tobacco use 5. Hypertension 6. Anxiety/psychiatric problem  PLAN:  1. Pharmacologic myocardial perfusion study to reassess perfusion pattern and LV systolic function 2. Risk factor modification stressed. Smoking cessation, blood pressure control, weight loss, diabetes control, and physical activity/active lifestyle. 3. The patient is focusing on the desire to have bypass surgery, but at this time he has no cardiopulmonary complaints although he is very sedentary. I explained the futility of bypass surgery if he cannot control his risk factors and take medications as recommended.   SUBJECTIVE: Reginald Gutierrez is a 48 y.o. male who is here because he is afraid that his heart is in bad shape. He will stat bypass surgery as was recommended in  2013. He refused surgery at bedtime. He has become increasingly sedentary. He does not have angina. He denies dyspnea. He has exertional intolerance due to fatigue. He denies orthopnea, and PND. There is no peripheral edema. He has not had syncope.  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 He understands that smoking cessation is important but has never been able to do so. He takes his medications noncompliant lady. He is sedentary without any attempts at routine physical activity. He is frustrated by the fact that he has diabetes and and multiple other medical problems. He is angry that he has been diagnosed with coronary disease and can't believe that it is as severe as stated because he  has no symptoms.   Allergies  Allergen Reactions  . Vancomycin Other (See Comments)    Pt doesn't remember     Current Outpatient Prescriptions on File Prior to Visit  Medication Sig Dispense Refill  . ACCU-CHEK AVIVA PLUS test strip TEST 6 TIMES A DAY AS DIRECTED.  150 each  12  . aspirin 81 MG tablet Take 81 mg by mouth daily.      Marland Kitchen atorvastatin (LIPITOR) 40 MG tablet take 1 tablet by mouth once daily  30 tablet  3  . budesonide (RHINOCORT AQUA) 32 MCG/ACT nasal spray Place 2 sprays into both nostrils daily.  8.6 g  0  . glucagon (GLUCAGON EMERGENCY) 1 MG injection Inject 1 mg into the muscle once as needed. Use as Directed for low blood sugar  3 each  5  . glucose blood test strip TEST BLOOD SUGAR 6 TIMES A DAY AS INSTRUCTED BY DR ELLISON  200 each  10  . HYDROcodone-acetaminophen (NORCO) 10-325 MG per tablet Take 1 tablet by mouth every 4 (four) hours as needed. recieves from pain management clinic. We do not dispnese this  30 tablet  0  . insulin glargine (LANTUS) 100 UNIT/ML injection Inject 25 Units into the skin at bedtime.       . INSULIN SYRINGE .5CC/29G 29G X 1/2" 0.5 ML MISC 1 Syringe by Does not apply route daily.  30 each  11  . levothyroxine (SYNTHROID, LEVOTHROID) 125 MCG tablet Take 1 tablet (125 mcg total) by mouth daily before breakfast.  30 tablet  11  . lisinopril (PRINIVIL,ZESTRIL) 40 MG tablet Take 0.5 tablets (20 mg total) by mouth daily.  15 tablet  4  . LORazepam (ATIVAN) 2 MG tablet take 1 tablet by mouth every 12 hours if needed for anxiety  60 tablet  0  . metoprolol tartrate (LOPRESSOR) 25 MG tablet Take 0.5 tablets (12.5 mg total) by mouth 2 (two) times daily.  30 tablet  3  . Vitamin D, Ergocalciferol, (DRISDOL) 50000 UNITS CAPS capsule take 1 capsule by mouth every week  4 capsule  0  . ferrous sulfate 325 (65 FE) MG tablet Take 1 tablet (325 mg total) by mouth daily with breakfast.  30 tablet  11   No current facility-administered medications on file  prior to visit.    Past Medical History  Diagnosis Date  . Diabetes mellitus type 1   . Hyperlipidemia   . Depression   . Umbilical hernia   . Hypertension   . Glaucoma   . CHF (congestive heart failure)   . Heart attack 12/16/11  . Hypothyroidism   . CAD (coronary artery disease) of artery bypass graft 07/19/2012  . Hypoglycemia, unspecified   . Disorder of bone and cartilage, unspecified   . Acute posthemorrhagic anemia   . Anxiety state, unspecified   . Chronic pain syndrome   . Other primary cardiomyopathies   . Acute respiratory failure   . Dysphagia, oral phase   . Stress fracture of tibia or fibula   . Syncope and collapse     Past Surgical History  Procedure Laterality Date  . Appendectomy      open  . Tibia im nail insertion  12/18/2011    Procedure: INTRAMEDULLARY (IM) NAIL TIBIAL;  Surgeon: Robert Collins, MD;  Location: [facility];  Service: Orthopedics;  Laterality: Bilateral;  . Insertion of dialysis catheter  12/31/2011    Procedure: INSERTION OF DIALYSIS CATHETER;  Surgeon: Christopher S Dickson, MD;  Location: [facility] OR;  Service: Vascular;  Laterality: N/A;  . Tonsillectomy  1980  . Fracture surgery  2012    wrist  . Fracture surgery  2013    bilateral tibia    History   Social History  . Marital Status: Single    Spouse Name: N/A    Number of Children: 0  . Years of Education: 16   Occupational History  . Sales- disabled    Social History Main Topics  . Smoking status: Current Some Day Smoker -- 1.00 packs/day    Types: Cigarettes  . Smokeless tobacco: Never Used  . Alcohol Use: No  . Drug Use: No     Comment: Past Cocaine abuse-2002-2003  . Sexual Activity: No   Other Topics Concern  . Not on file   Social History Narrative  . No narrative on file    Family History  Problem Relation Age of Onset  . Heart disease Father     ROS: Difficulty sleeping. Chronic shoulder pain. Chronic pain syndrome requiring opiates. Continues to smoke  cigarettes. Anger management issues.. Other systems negative for complaints.  OBJECTIVE: BP 142/72  Pulse 56  Ht 5' 6" (1.676 m)  Wt 166 lb (75.297 kg)  BMI 26.81 kg/m2,  General: No acute distress, obesity HEENT: normal with no evidence of pallor or jaundice Neck: JVD flat. Carotids absent bruits Chest: Clear Cardiac: Murmur: 1-2 of 6 apical systolic murmur. Gallop: S4 gallop. Rhythm: Normal. Other: Normal Abdomen: Bruit: Absent. Pulsation: Absent Extremities: Edema: Trace edema. Pulses: 2+ and symmetric Neuro: Anxious and aggressive. No focal deficit  Psych: Agitated at times  ECG: Normal when recently performed  

## 2014-02-14 ENCOUNTER — Encounter: Payer: Self-pay | Admitting: Thoracic Surgery (Cardiothoracic Vascular Surgery)

## 2014-02-14 ENCOUNTER — Institutional Professional Consult (permissible substitution) (INDEPENDENT_AMBULATORY_CARE_PROVIDER_SITE_OTHER): Payer: Medicare Other | Admitting: Thoracic Surgery (Cardiothoracic Vascular Surgery)

## 2014-02-14 VITALS — BP 108/60 | HR 60 | Resp 16 | Ht 67.0 in | Wt 170.0 lb

## 2014-02-14 DIAGNOSIS — F1920 Other psychoactive substance dependence, uncomplicated: Secondary | ICD-10-CM

## 2014-02-14 DIAGNOSIS — I25810 Atherosclerosis of coronary artery bypass graft(s) without angina pectoris: Secondary | ICD-10-CM

## 2014-02-14 DIAGNOSIS — I2589 Other forms of chronic ischemic heart disease: Secondary | ICD-10-CM

## 2014-02-14 DIAGNOSIS — F1120 Opioid dependence, uncomplicated: Secondary | ICD-10-CM

## 2014-02-14 DIAGNOSIS — I2510 Atherosclerotic heart disease of native coronary artery without angina pectoris: Secondary | ICD-10-CM

## 2014-02-14 DIAGNOSIS — I255 Ischemic cardiomyopathy: Secondary | ICD-10-CM

## 2014-02-14 NOTE — Progress Notes (Signed)
[address]       [place] [address]             [phone]     CARDIOTHORACIC SURGERY CONSULTATION REPORT  Referring Provider is [name], MD PCP is Oneal Grout, MD and Elvera Lennox, PA-C Endocrinologist is Romero Belling, MD  Chief Complaint  Patient presents with  . Coronary Artery Disease    eval for CABG...CATHED 02/13/14    HPI:  Patient is a 48 year old disabled single white male from [place], [place] referred for possible surgical treatment of severe three-vessel coronary artery disease with ischemic cardiomyopathy.  The patient has multiple medical problems including severe three-vessel coronary artery disease, chronic systolic congestive heart failure, hypertension, type 1 diabetes mellitus, hyperlipidemia, long-standing tobacco abuse, remote history of cocaine abuse, and chronic pain with continuous narcotic dependence. He was originally diagnosed with coronary artery disease in 2013. At that time he presented with an acute non-ST segment elevation myocardial infarction. Diagnostic cardiac catheterization was performed demonstrating severe three-vessel coronary artery disease. Surgical revascularization was recommended, but the patient refused and was never evaluated by a surgeon.  He has remained stable from a cardiac standpoint since that time but had not been followed regularly by a cardiologist. He was seen by his primary care physician recently and urged to reestablish care with a cardiologist. He was seen by Dr. Katrinka Blazing on 01/25/2014, and repeat diagnostic cardiac catheterization was recommended. Catheterization was performed yesterday and notable for the presence of severe three-vessel coronary artery disease with moderate global left ventricular systolic dysfunction. The patient has been referred to consider surgical revascularization.  The patient lives alone and [place], but his mother lives next door. He has been disabled  for the past 2 years. He lives a sedentary lifestyle. He drives a car and cares for himself.  He does not exercise at all on a regular basis. He smokes at least one pack of cigarettes daily. He takes anywhere from 3-15 tablets of oxycodone on a daily basis for chronic pain related to "frozen shoulder". He does not drink alcohol. He states that he has had intermittent chest discomfort and shortness of breath off and on in the past but not recently.  He was able to walk from the parking lot to our office today without developing any shortness of breath or chest discomfort. He states that he typically can walk his dog around the block without developing shortness of breath or chest discomfort. He denies any symptoms of PND, orthopnea, or lower extremity edema. He has occasional dizzy spells without syncope. He has not had palpitations. He admits that he suffers from chronic anxiety and likely depression. Recently he has felt as though something that may happen in the future. He cannot be more specific about this.  Past Medical History  Diagnosis Date  . Diabetes mellitus type 1   . Hyperlipidemia   . Depression   . Umbilical hernia   . Hypertension   . Glaucoma   . CHF (congestive heart failure)   . Heart attack 12/16/11  . Hypothyroidism   . Hypoglycemia, unspecified   . Disorder of bone and cartilage, unspecified   . Acute posthemorrhagic anemia   . Anxiety state, unspecified   . Chronic pain syndrome   . Acute respiratory failure   . Dysphagia, oral phase   . Stress fracture of tibia or fibula   . Syncope and collapse   . Coronary artery disease 07/19/2012  .  NSTEMI (non-ST elevated myocardial infarction) 12/19/2011  . Ischemic cardiomyopathy   . Narcotic dependency, continuous     Past Surgical History  Procedure Laterality Date  . Appendectomy      open  . Tibia im nail insertion  12/18/2011    Procedure: INTRAMEDULLARY (IM) NAIL TIBIAL;  Surgeon: Eugenia Mcalpine, MD;  Location: [facility]  ORS;  Service: Orthopedics;  Laterality: Bilateral;  . Insertion of dialysis catheter  12/31/2011    Procedure: INSERTION OF DIALYSIS CATHETER;  Surgeon: Chuck Hint, MD;  Location: [facility] OR;  Service: Vascular;  Laterality: N/A;  . Tonsillectomy  1980  . Fracture surgery  2012    wrist  . Fracture surgery  2013    bilateral tibia    Family History  Problem Relation Age of Onset  . Heart disease Father     History   Social History  . Marital Status: Single    Spouse Name: N/A    Number of Children: 0  . Years of Education: 16   Occupational History  . Sales- disabled    Social History Main Topics  . Smoking status: Current Some Day Smoker -- 1.00 packs/day    Types: Cigarettes  . Smokeless tobacco: Never Used  . Alcohol Use: No  . Drug Use: No     Comment: Past Cocaine abuse-2002-2003  . Sexual Activity: No   Other Topics Concern  . Not on file   Social History Narrative  . No narrative on file    Current Outpatient Prescriptions  Medication Sig Dispense Refill  . ACCU-CHEK AVIVA PLUS test strip TEST 6 TIMES A DAY AS DIRECTED.  150 each  12  . aspirin EC 81 MG tablet Take 81 mg by mouth daily.      Marland Kitchen atorvastatin (LIPITOR) 40 MG tablet Take 40 mg by mouth daily.      . B-D INS SYRINGE 0.5CC/30GX1/2" 30G X 1/2" 0.5 ML MISC use as directed  100 each  0  . cephALEXin (KEFLEX) 500 MG capsule Take 500 mg by mouth 2 (two) times daily. 10 day course started 02/12/14      . ferrous sulfate 325 (65 FE) MG tablet Take 325 mg by mouth daily with breakfast.      . glucagon (GLUCAGON EMERGENCY) 1 MG injection Inject 1 mg into the muscle once as needed (for low blood sugar).      Marland Kitchen glucose blood test strip TEST BLOOD SUGAR 6 TIMES A DAY AS INSTRUCTED BY DR ELLISON  200 each  10  . insulin glargine (LANTUS) 100 UNIT/ML injection Inject 35 Units into the skin daily.       . insulin regular (NOVOLIN R,HUMULIN R) 100 units/mL injection Inject 0-0.05 mLs (0-5 Units total) into  the skin 4 (four) times daily as needed for high blood sugar (cbg >200). Based on carb count  10 mL  1  . INSULIN SYRINGE .5CC/29G 29G X 1/2" 0.5 ML MISC 1 Syringe by Does not apply route daily.  30 each  11  . levothyroxine (SYNTHROID, LEVOTHROID) 125 MCG tablet Take 1 tablet (125 mcg total) by mouth daily before breakfast.  30 tablet  11  . lisinopril (PRINIVIL,ZESTRIL) 40 MG tablet Take 0.5 tablets (20 mg total) by mouth daily.  15 tablet  4  . LORazepam (ATIVAN) 2 MG tablet Take 2 mg by mouth See admin instructions. Take 1 tablet (2 mg) twice daily, may take an additional tablet during the day for anxiety if needed      .  metoprolol tartrate (LOPRESSOR) 25 MG tablet Take 0.5 tablets (12.5 mg total) by mouth 2 (two) times daily.  30 tablet  3  . Multiple Vitamin (MULTIVITAMIN WITH MINERALS) TABS tablet Take 1 tablet by mouth daily. Diabetes daily pak from [employer]      . oxyCODONE (ROXICODONE) 15 MG immediate release tablet Take 15-30 mg by mouth 5 (five) times daily as needed for pain.       No current facility-administered medications for this visit.    No Known Allergies    Review of Systems:   General:  decreased appetite, decreased energy, + weight gain, no weight loss, no fever  Cardiac:  + chest pain with exertion, + atypical chest pain at rest, + SOB with exertion, no resting SOB, no PND, no orthopnea, no palpitations, no arrhythmia, no atrial fibrillation, no LE edema, + dizzy spells, no syncope  Respiratory:  + shortness of breath, no home oxygen, + productive cough, + dry cough, no bronchitis, + wheezing, no hemoptysis, no asthma, no pain with inspiration or cough, no sleep apnea, no CPAP at night  GI:   no difficulty swallowing, no reflux, no frequent heartburn, no hiatal hernia, + abdominal pain, + intermittent constipation, occasional diarrhea, no hematochezia, no hematemesis, no melena  GU:   no dysuria,  no frequency, no urinary tract infection, no hematuria, no enlarged  prostate, no kidney stones, no kidney disease  Vascular:  no pain suggestive of claudication, + pain in feet, no leg cramps, no varicose veins, no DVT, no non-healing foot ulcer  Neuro:   ? stroke, ? TIA's, + seizures, no headaches, no temporary blindness one eye,  no slurred speech, no peripheral neuropathy, + chronic pain, no instability of gait, + memory/cognitive dysfunction  Musculoskeletal: + arthritis, no joint swelling, no myalgias, no difficulty walking, decreased mobility   Skin:   no rash, no itching, + recent skin infection both legs from insect bites - on oral antibiotics, no pressure sores or ulcerations  Psych:   + anxiety, + depression, + nervousness, + unusual recent stress  Eyes:   no blurry vision, no floaters, + recent vision changes, + wears glasses or contacts  ENT:   no hearing loss, no loose or painful teeth, no dentures, last saw dentist ?  Hematologic:  no easy bruising, no abnormal bleeding, no clotting disorder, no frequent epistaxis  Endocrine:  + diabetes, checks CBG's at home     Physical Exam:   BP 108/60  Pulse 60  Resp 16  Ht 5\' 7"  (1.702 m)  Wt 170 lb (77.111 kg)  BMI 26.62 kg/m2  SpO2 97%  General:  Mildly obese, chronically anxious-appearing  HEENT:  Unremarkable   Neck:   no JVD, no bruits, no adenopathy   Chest:   clear to auscultation, symmetrical breath sounds, no wheezes, no rhonchi   CV:   RRR, no  murmur   Abdomen:  soft, non-tender, no masses   Extremities:  warm, well-perfused, pulses not palpable, no LE edema, + old insect bites both lower legs  Rectal/GU  Deferred  Neuro:   Grossly non-focal and symmetrical throughout  Skin:   Clean and dry, no rashes, no breakdown   Diagnostic Tests:  Left Heart Catheterization with Coronary Angiography Report  Vivi FernsKevin J Michaelsen  48 y.o.  male  [birth year]  Procedure Date: 02/13/2014  Referring Physician: Cherrie GauzeH. WB Leia AlfSmith, III, M.D.  Primary Cardiologist:: Patty SermonsBrackbill, M.D.  INDICATIONS: Known three-vessel  coronary disease by catheterization greater than one year ago.  Progressive symptoms, intermediate to high risk myocardial perfusion study, and diabetes mellitus.  PROCEDURE: 1. Left heart catheterization; 2. Left ventriculography; 3. Coronary angina  CONSENT:  The risks, benefits, and details of the procedure were explained in detail to the patient. Risks including death, stroke, heart attack, kidney injury, allergy, limb ischemia, bleeding and radiation injury were discussed. The patient verbalized understanding and wanted to proceed. Informed written consent was obtained.  PROCEDURE TECHNIQUE: After Xylocaine anesthesia a 5 French slender sheath was placed in the right radial artery with an angiocath and the modified Seldinger technique. Coronary angiography was done using a 5 F JL 3.5 and JR 4 catheter. We will unable to cannulate the left coronary with the JL and used multiple other catheters including a 3.0 EBU, a 3.5 EBU, 3.0 CLS, 4.0 TIG, 3.0 Easy Rad left. We ultimately converted to the right femoral approach to achieve left coronary angiography with a 5 French A2 MP. Left ventriculography was done using the JR 4 catheter and hand injection.  The patient received high-dose Versed and fentanyl, still resulting in an adequate sedation. The patient was moving about, talking, and breathing erratically.  CONTRAST: Total of 150 cc.  COMPLICATIONS: Unable to cannulate the left coronary system with cath is as noted above resulting in right femoral approach. Difficulty was entry of the innominate artery from the distal half of the aortic arch and subsequent difficulty with cath and advancement down the descending aorta when attempting to cannulate the left coronary.  HEMODYNAMICS: Aortic pressure 112/57 mmHg; LV pressure 115/19 mmHg; LVEDP 27 mm mercury  ANGIOGRAPHIC DATA: The left main coronary artery is widely patent.  The left anterior descending artery is severely diseased proximal to mid disease with  up to 80-90% stenosis of a segmental fashion. The the mid to distal LAD is patent and graftable. First and second diagonal ostial stenoses are the second diagonal is graftable containing an 80% stenosis.  The left circumflex artery is nondominant. 2 obtuse marginal branches arise from the circumflex. The first branch arises very proximal and contains diffuse 90% stenosis in the proximal third. The distal vessel is graftable. The circumflex beyond the origin of the first obtuse marginal contains 80-90% obstruction and a small second obtuse marginal is free of any significant obstruction..  The right coronary artery is patent but receives left to right collaterals from both the LAD and circumflex. 2 large left ventricular branches are visualized. The PDA is faintly visualized. The right coronary is a large vessel. It contains 99% distal obstruction with faint antegrade flow. There is 80% obstruction in the second acute marginal branch. Right to right collaterals are noted to the PDA. The PDA is severely and diffusely diseased in the proximal third. The distal vessel is graftable.Marland Kitchen  LEFT VENTRICULOGRAM: Left ventricular angiogram was done in the 30 RAO projection and revealed severe inferior hypokinesis. Estimated ejection fraction 35%.  IMPRESSIONS: 1. Severe three-vessel coronary disease involving the proximal to mid LAD, the first obtuse marginal , and the distal right coronary.  2. Left ventricular systolic dysfunction. EF 35%  RECOMMENDATION: CABG.          Impression:  Patient has severe three-vessel coronary artery disease with moderate global left ventricular systolic dysfunction. He appears to have diffuse coronary artery disease as is typical for patient's with long-standing diabetes. I agree that he would likely benefit from surgical revascularization in terms of decreased risk of future cardiac events and prolonged long-term survival.  Whether or not the patient would experience  any  significant improvement in his exercise tolerance or other symptoms is more difficult to characterize with any degree of certainty. In addition, even if he undergoes successful surgery the patient's long-term prognosis will be significantly attenuated because of his underlying multiple medical problems and behavioral problems including history of significant underlying psychiatric difficulties with polysubstance abuse.   Plan:  I discussed options at length with the patient and his mother in the office today. We reviewed the indications, risks, and potential benefits of coronary artery bypass grafting.  Alternative treatment strategies have been discussed.  The patient expressed an understanding all potential associated risks of surgery including but not limited to risk of death, stroke or other neurologic complication, myocardial infarction, congestive heart failure, respiratory failure, renal failure, bleeding requiring blood transfusion and/or reexploration, aortic dissection or other major vascular complication, arrhythmia, heart block or bradycardia requiring permanent pacemaker, pneumonia, pleural effusion, wound infection, pulmonary embolus or other thromboembolic complication, chronic pain or other delayed complications related to median sternotomy, or the late recurrence of symptomatic ischemic heart disease and/or congestive heart failure.  The importance of long term risk modification have been emphasized.  All questions answered.  The patient wants to consider options further but seems to be leaning towards proceeding with surgery in the near future.  I have urged the patient to stop smoking both to improve his long-term prognosis and to decrease his short-term perioperative risks. He will complete his current course of oral antibiotics prescribed for the infected insect bites on both lower legs.  He will return for followup in 4 weeks and possibly schedule surgery at that time.     I spent in  excess of 90 minutes during the conduct of this office consultation and >50% of this time involved direct face-to-face encounter with the patient for counseling and/or coordination of their care.   Salvatore Decent. Cornelius Moras, MD 02/14/2014 1:05 PM

## 2014-02-14 NOTE — Patient Instructions (Signed)
The patient should make every effort to stop smoking immediately and permanently.  The patient is reminded to make every effort to keep their diabetes under very tight control.  They should follow up closely with their primary care physician or endocrinologist and strive to keep their hemoglobin A1c levels as low as possible, preferably near or below 6.0

## 2014-02-24 ENCOUNTER — Emergency Department (HOSPITAL_COMMUNITY): Payer: Medicare Other

## 2014-02-24 ENCOUNTER — Emergency Department (HOSPITAL_COMMUNITY)
Admission: EM | Admit: 2014-02-24 | Discharge: 2014-02-25 | Disposition: A | Discharge status: Home / Self Care | Payer: Medicare Other | Attending: Emergency Medicine | Admitting: Emergency Medicine

## 2014-02-24 ENCOUNTER — Encounter (HOSPITAL_COMMUNITY): Payer: Self-pay | Admitting: Emergency Medicine

## 2014-02-24 DIAGNOSIS — E039 Hypothyroidism, unspecified: Secondary | ICD-10-CM | POA: Insufficient documentation

## 2014-02-24 DIAGNOSIS — I10 Essential (primary) hypertension: Secondary | ICD-10-CM | POA: Insufficient documentation

## 2014-02-24 DIAGNOSIS — IMO0001 Reserved for inherently not codable concepts without codable children: Secondary | ICD-10-CM

## 2014-02-24 DIAGNOSIS — Z79899 Other long term (current) drug therapy: Secondary | ICD-10-CM | POA: Insufficient documentation

## 2014-02-24 DIAGNOSIS — S298XXA Other specified injuries of thorax, initial encounter: Secondary | ICD-10-CM | POA: Insufficient documentation

## 2014-02-24 DIAGNOSIS — I5032 Chronic diastolic (congestive) heart failure: Secondary | ICD-10-CM | POA: Insufficient documentation

## 2014-02-24 DIAGNOSIS — M8448XA Pathological fracture, other site, initial encounter for fracture: Secondary | ICD-10-CM | POA: Insufficient documentation

## 2014-02-24 DIAGNOSIS — I2510 Atherosclerotic heart disease of native coronary artery without angina pectoris: Secondary | ICD-10-CM | POA: Insufficient documentation

## 2014-02-24 DIAGNOSIS — S42109A Fracture of unspecified part of scapula, unspecified shoulder, initial encounter for closed fracture: Secondary | ICD-10-CM | POA: Insufficient documentation

## 2014-02-24 DIAGNOSIS — Z8679 Personal history of other diseases of the circulatory system: Secondary | ICD-10-CM | POA: Insufficient documentation

## 2014-02-24 DIAGNOSIS — Z862 Personal history of diseases of the blood and blood-forming organs and certain disorders involving the immune mechanism: Secondary | ICD-10-CM | POA: Insufficient documentation

## 2014-02-24 DIAGNOSIS — E119 Type 2 diabetes mellitus without complications: Secondary | ICD-10-CM | POA: Insufficient documentation

## 2014-02-24 DIAGNOSIS — Z8709 Personal history of other diseases of the respiratory system: Secondary | ICD-10-CM | POA: Insufficient documentation

## 2014-02-24 DIAGNOSIS — S42101A Fracture of unspecified part of scapula, right shoulder, initial encounter for closed fracture: Secondary | ICD-10-CM

## 2014-02-24 DIAGNOSIS — Z8739 Personal history of other diseases of the musculoskeletal system and connective tissue: Secondary | ICD-10-CM | POA: Insufficient documentation

## 2014-02-24 DIAGNOSIS — Z8719 Personal history of other diseases of the digestive system: Secondary | ICD-10-CM | POA: Insufficient documentation

## 2014-02-24 DIAGNOSIS — S0990XA Unspecified injury of head, initial encounter: Secondary | ICD-10-CM | POA: Insufficient documentation

## 2014-02-24 DIAGNOSIS — K469 Unspecified abdominal hernia without obstruction or gangrene: Secondary | ICD-10-CM | POA: Insufficient documentation

## 2014-02-24 DIAGNOSIS — S0003XA Contusion of scalp, initial encounter: Secondary | ICD-10-CM | POA: Insufficient documentation

## 2014-02-24 DIAGNOSIS — Y9389 Activity, other specified: Secondary | ICD-10-CM | POA: Insufficient documentation

## 2014-02-24 DIAGNOSIS — Z8659 Personal history of other mental and behavioral disorders: Secondary | ICD-10-CM | POA: Insufficient documentation

## 2014-02-24 DIAGNOSIS — S4980XA Other specified injuries of shoulder and upper arm, unspecified arm, initial encounter: Secondary | ICD-10-CM | POA: Insufficient documentation

## 2014-02-24 DIAGNOSIS — T07XXXA Unspecified multiple injuries, initial encounter: Secondary | ICD-10-CM

## 2014-02-24 DIAGNOSIS — S0083XA Contusion of other part of head, initial encounter: Secondary | ICD-10-CM | POA: Insufficient documentation

## 2014-02-24 DIAGNOSIS — Y92410 Unspecified street and highway as the place of occurrence of the external cause: Secondary | ICD-10-CM | POA: Insufficient documentation

## 2014-02-24 DIAGNOSIS — S1093XA Contusion of unspecified part of neck, initial encounter: Secondary | ICD-10-CM

## 2014-02-24 DIAGNOSIS — F17200 Nicotine dependence, unspecified, uncomplicated: Secondary | ICD-10-CM | POA: Insufficient documentation

## 2014-02-24 DIAGNOSIS — Z8669 Personal history of other diseases of the nervous system and sense organs: Secondary | ICD-10-CM | POA: Insufficient documentation

## 2014-02-24 DIAGNOSIS — S46909A Unspecified injury of unspecified muscle, fascia and tendon at shoulder and upper arm level, unspecified arm, initial encounter: Secondary | ICD-10-CM | POA: Insufficient documentation

## 2014-02-24 DIAGNOSIS — M4850XA Collapsed vertebra, not elsewhere classified, site unspecified, initial encounter for fracture: Secondary | ICD-10-CM

## 2014-02-24 LAB — RAPID URINE DRUG SCREEN, HOSP PERFORMED
Amphetamines: NOT DETECTED
Barbiturates: NOT DETECTED
Benzodiazepines: NOT DETECTED
Cocaine: NOT DETECTED
Opiates: NOT DETECTED
Tetrahydrocannabinol: POSITIVE — AB

## 2014-02-24 LAB — COMPREHENSIVE METABOLIC PANEL
ALT: 26 U/L (ref 0–53)
AST: 30 U/L (ref 0–37)
Albumin: 3.9 g/dL (ref 3.5–5.2)
Alkaline Phosphatase: 102 U/L (ref 39–117)
BUN: 15 mg/dL (ref 6–23)
CO2: 24 mEq/L (ref 19–32)
Calcium: 9.2 mg/dL (ref 8.4–10.5)
Chloride: 107 mEq/L (ref 96–112)
Creatinine, Ser: 0.86 mg/dL (ref 0.50–1.35)
GFR calc Af Amer: >90 mL/min (ref >90)
GFR calc non Af Amer: >90 mL/min (ref >90)
Glucose, Bld: 103 mg/dL — ABNORMAL HIGH (ref 70–99)
Potassium: 4.3 mEq/L (ref 3.7–5.3)
SODIUM: 144 meq/L (ref 137–147)
TOTAL PROTEIN: 7.7 g/dL (ref 6.0–8.3)
Total Bilirubin: 0.5 mg/dL (ref 0.3–1.2)

## 2014-02-24 LAB — CBC WITH DIFFERENTIAL/PLATELET
Basophils Absolute: 0 10*3/uL (ref 0.0–0.1)
Basophils Relative: 0 % (ref 0–1)
EOS ABS: 0.2 10*3/uL (ref 0.0–0.7)
Eosinophils Relative: 2 % (ref 0–5)
HCT: 42.1 % (ref 39.0–52.0)
Hemoglobin: 14.2 g/dL (ref 13.0–17.0)
Lymphocytes Relative: 21 % (ref 12–46)
Lymphs Abs: 2.1 10*3/uL (ref 0.7–4.0)
MCH: 30.3 pg (ref 26.0–34.0)
MCHC: 33.7 g/dL (ref 30.0–36.0)
MCV: 89.8 fL (ref 78.0–100.0)
Monocytes Absolute: 0.7 10*3/uL (ref 0.1–1.0)
Monocytes Relative: 7 % (ref 3–12)
NEUTROS PCT: 70 % (ref 43–77)
Neutro Abs: 6.8 10*3/uL (ref 1.7–7.7)
PLATELETS: 376 10*3/uL (ref 150–400)
RBC: 4.69 MIL/uL (ref 4.22–5.81)
RDW: 14.4 % (ref 11.5–15.5)
WBC: 9.8 10*3/uL (ref 4.0–10.5)

## 2014-02-24 LAB — URINALYSIS, ROUTINE W REFLEX MICROSCOPIC
Bilirubin Urine: NEGATIVE
Glucose, UA: NEGATIVE mg/dL
Hgb urine dipstick: NEGATIVE
Ketones, ur: NEGATIVE mg/dL
LEUKOCYTES UA: NEGATIVE
Nitrite: NEGATIVE
PROTEIN: NEGATIVE mg/dL
Specific Gravity, Urine: >1.046 — ABNORMAL HIGH (ref 1.005–1.030)
Urobilinogen, UA: 0.2 mg/dL (ref 0.0–1.0)
pH: 5.5 (ref 5.0–8.0)

## 2014-02-24 LAB — ETHANOL

## 2014-02-24 LAB — CBG MONITORING, ED: GLUCOSE-CAPILLARY: 104 mg/dL — AB (ref 70–99)

## 2014-02-24 MED ORDER — OXYCODONE HCL 20 MG PO TABS: 20.0000 mg | ORAL_TABLET | Freq: Four times a day (QID) | ORAL | Status: DC | PRN

## 2014-02-24 MED ORDER — TETANUS-DIPHTH-ACELL PERTUSSIS 5-2.5-18.5 LF-MCG/0.5 IM SUSP
0.5000 mL | Freq: Once | INTRAMUSCULAR | Status: AC
  Administered 2014-02-24: 0.5 mL via INTRAMUSCULAR
  Filled 2014-02-24: qty 0.5

## 2014-02-24 MED ORDER — FENTANYL CITRATE 0.05 MG/ML IJ SOLN
100.0000 ug | INTRAMUSCULAR | Status: AC | PRN
  Administered 2014-02-24 (×3): 100 ug via INTRAVENOUS
  Filled 2014-02-24 (×3): qty 2

## 2014-02-24 MED ORDER — ONDANSETRON HCL 4 MG/2ML IJ SOLN
4.0000 mg | Freq: Once | INTRAMUSCULAR | Status: AC
  Administered 2014-02-24: 4 mg via INTRAVENOUS
  Filled 2014-02-24: qty 2

## 2014-02-24 MED ORDER — OXYCODONE HCL 5 MG PO TABS
20.0000 mg | ORAL_TABLET | Freq: Once | ORAL | Status: AC
  Administered 2014-02-24: 20 mg via ORAL
  Filled 2014-02-24: qty 4

## 2014-02-24 MED ORDER — IOHEXOL 300 MG/ML  SOLN
100.0000 mL | Freq: Once | INTRAMUSCULAR | Status: AC | PRN
  Administered 2014-02-24: 100 mL via INTRAVENOUS

## 2014-02-24 MED ORDER — SODIUM CHLORIDE 0.9 % IV SOLN
INTRAVENOUS | Status: DC
  Administered 2014-02-24: 20:00:00 via INTRAVENOUS

## 2014-02-24 NOTE — ED Notes (Signed)
Pt's Sp02 level is 96% on Room Air.

## 2014-02-24 NOTE — ED Notes (Signed)
MD at bedside. 

## 2014-02-24 NOTE — Discharge Instructions (Signed)
Soak in warm tub, and scrub the abrasions well with soap, twice a day. Use ice on the sore areas 3 or 4 times a day. Return here, if needed, for problems.  Abrasion An abrasion is a cut or scrape of the skin. Abrasions do not extend through all layers of the skin and most heal within 10 days. It is important to care for your abrasion properly to prevent infection. CAUSES  Most abrasions are caused by falling on, or gliding across, the ground or other surface. When your skin rubs on something, the outer and inner layer of skin rubs off, causing an abrasion. DIAGNOSIS  Your caregiver will be able to diagnose an abrasion during a physical exam.  TREATMENT  Your treatment depends on how large and deep the abrasion is. Generally, your abrasion will be cleaned with water and a mild soap to remove any dirt or debris. An antibiotic ointment may be put over the abrasion to prevent an infection. A bandage (dressing) may be wrapped around the abrasion to keep it from getting dirty.  You may need a tetanus shot if:  You cannot remember when you had your last tetanus shot.  You have never had a tetanus shot.  The injury broke your skin. If you get a tetanus shot, your arm may swell, get red, and feel warm to the touch. This is common and not a problem. If you need a tetanus shot and you choose not to have one, there is a rare chance of getting tetanus. Sickness from tetanus can be serious.  HOME CARE INSTRUCTIONS   If a dressing was applied, change it at least once a day or as directed by your caregiver. If the bandage sticks, soak it off with warm water.   Wash the area with water and a mild soap to remove all the ointment 2 times a day. Rinse off the soap and pat the area dry with a clean towel.   Reapply any ointment as directed by your caregiver. This will help prevent infection and keep the bandage from sticking. Use gauze over the wound and under the dressing to help keep the bandage from  sticking.   Change your dressing right away if it becomes wet or dirty.   Only take over-the-counter or prescription medicines for pain, discomfort, or fever as directed by your caregiver.   Follow up with your caregiver within 24-48 hours for a wound check, or as directed. If you were not given a wound-check appointment, look closely at your abrasion for redness, swelling, or pus. These are signs of infection. SEEK IMMEDIATE MEDICAL CARE IF:   You have increasing pain in the wound.   You have redness, swelling, or tenderness around the wound.   You have pus coming from the wound.   You have a fever or persistent symptoms for more than 2-3 days.  You have a fever and your symptoms suddenly get worse.  You have a bad smell coming from the wound or dressing.  MAKE SURE YOU:   Understand these instructions.  Will watch your condition.  Will get help right away if you are not doing well or get worse. Document Released: 05/27/2005 Document Revised: 08/03/2012 Document Reviewed: 07/21/2011 [facility] Patient Information 2015 [place], [place]. This information is not intended to replace advice given to you by your health care provider. Make sure you discuss any questions you have with your health care provider.  Contusion A contusion is a deep bruise. Contusions are the result of  an injury that caused bleeding under the skin. The contusion may turn blue, purple, or yellow. Minor injuries will give you a painless contusion, but more severe contusions may stay painful and swollen for a few weeks.  CAUSES  A contusion is usually caused by a blow, trauma, or direct force to an area of the body. SYMPTOMS   Swelling and redness of the injured area.  Bruising of the injured area.  Tenderness and soreness of the injured area.  Pain. DIAGNOSIS  The diagnosis can be made by taking a history and physical exam. An X-ray, CT scan, or MRI may be needed to determine if there were any  associated injuries, such as fractures. TREATMENT  Specific treatment will depend on what area of the body was injured. In general, the best treatment for a contusion is resting, icing, elevating, and applying cold compresses to the injured area. Over-the-counter medicines may also be recommended for pain control. Ask your caregiver what the best treatment is for your contusion. HOME CARE INSTRUCTIONS   Put ice on the injured area.  Put ice in a plastic bag.  Place a towel between your skin and the bag.  Leave the ice on for 15-20 minutes, 3-4 times a day, or as directed by your health care provider.  Only take over-the-counter or prescription medicines for pain, discomfort, or fever as directed by your caregiver. Your caregiver may recommend avoiding anti-inflammatory medicines (aspirin, ibuprofen, and naproxen) for 48 hours because these medicines may increase bruising.  Rest the injured area.  If possible, elevate the injured area to reduce swelling. SEEK IMMEDIATE MEDICAL CARE IF:   You have increased bruising or swelling.  You have pain that is getting worse.  Your swelling or pain is not relieved with medicines. MAKE SURE YOU:   Understand these instructions.  Will watch your condition.  Will get help right away if you are not doing well or get worse. Document Released: 05/27/2005 Document Revised: 08/22/2013 Document Reviewed: 06/22/2011 [facility] Patient Information 2015 [place], [place]. This information is not intended to replace advice given to you by your health care provider. Make sure you discuss any questions you have with your health care provider.  Head Injury You have received a head injury. It does not appear serious at this time. Headaches and vomiting are common following head injury. It should be easy to awaken from sleeping. Sometimes it is necessary for you to stay in the emergency department for a while for observation. Sometimes admission to the hospital  may be needed. After injuries such as yours, most problems occur within the first 24 hours, but side effects may occur up to 7-10 days after the injury. It is important for you to carefully monitor your condition and contact your health care provider or seek immediate medical care if there is a change in your condition. WHAT ARE THE TYPES OF HEAD INJURIES? Head injuries can be as minor as a bump. Some head injuries can be more severe. More severe head injuries include:  A jarring injury to the brain (concussion).  A bruise of the brain (contusion). This mean there is bleeding in the brain that can cause swelling.  A cracked skull (skull fracture).  Bleeding in the brain that collects, clots, and forms a bump (hematoma). WHAT CAUSES A HEAD INJURY? A serious head injury is most likely to happen to someone who is in a car wreck and is not wearing a seat belt. Other causes of major head injuries include bicycle  or motorcycle accidents, sports injuries, and falls. HOW ARE HEAD INJURIES DIAGNOSED? A complete history of the event leading to the injury and your current symptoms will be helpful in diagnosing head injuries. Many times, pictures of the brain, such as CT or MRI are needed to see the extent of the injury. Often, an overnight hospital stay is necessary for observation.  WHEN SHOULD I SEEK IMMEDIATE MEDICAL CARE?  You should get help right away if:  You have confusion or drowsiness.  You feel sick to your stomach (nauseous) or have continued, forceful vomiting.  You have dizziness or unsteadiness that is getting worse.  You have severe, continued headaches not relieved by medicine. Only take over-the-counter or prescription medicines for pain, fever, or discomfort as directed by your health care provider.  You do not have normal function of the arms or legs or are unable to walk.  You notice changes in the black spots in the center of the colored part of your eye (pupil).  You have a  clear or bloody fluid coming from your nose or ears.  You have a loss of vision. During the next 24 hours after the injury, you must stay with someone who can watch you for the warning signs. This person should contact local emergency services (911 in the [place] if you have seizures, you become unconscious, or you are unable to wake up. HOW CAN I PREVENT A HEAD INJURY IN THE FUTURE? The most important factor for preventing major head injuries is avoiding motor vehicle accidents. To minimize the potential for damage to your head, it is crucial to wear seat belts while riding in motor vehicles. Wearing helmets while bike riding and playing collision sports (like football) is also helpful. Also, avoiding dangerous activities around the house will further help reduce your risk of head injury.  WHEN CAN I RETURN TO NORMAL ACTIVITIES AND ATHLETICS? You should be reevaluated by your health care provider before returning to these activities. If you have any of the following symptoms, you should not return to activities or contact sports until 1 week after the symptoms have stopped:  Persistent headache.  Dizziness or vertigo.  Poor attention and concentration.  Confusion.  Memory problems.  Nausea or vomiting.  Fatigue or tire easily.  Irritability.  Intolerant of bright lights or loud noises.  Anxiety or depression.  Disturbed sleep. MAKE SURE YOU:   Understand these instructions.  Will watch your condition.  Will get help right away if you are not doing well or get worse. Document Released: 08/17/2005 Document Revised: 08/22/2013 Document Reviewed: 04/24/2013 [facility] Patient Information 2015 [place], [place]. This information is not intended to replace advice given to you by your health care provider. Make sure you discuss any questions you have with your health care provider.  Vertebral Fracture You have a fracture of one or more vertebra. These are the bony parts that form the  spine. Minor vertebral fractures happen when people fall. Osteoporosis is associated with many of these fractures. Hospital care may not be necessary for minor compression fractures that are stable. However, multiple fractures of the spine or unstable injuries can cause severe pain and even damage the spinal cord. A spinal cord injury may cause paralysis, numbness, or loss of normal bowel and bladder control.  Normally there is pain and stiffness in the back for 3 to 6 weeks after a vertebral fracture. Bed rest for several days, pain medicine, and a slow return to activity is often the only treatment  that is needed depending on the location of the fracture. Neck and back braces may be helpful in reducing pain and increasing mobility. When your pain allows, you should begin walking or swimming to help maintain your endurance. Exercises to improve motion and to strengthen the back may also be useful after the initial pain improves. Treatment for osteoporosis may be essential for full recovery. This will help reduce your risk of vertebral fractures with a future fall. During the first few days after a spine fracture you may feel nauseated or vomit. If this is severe, hospital care with IV fluids will be needed.  Arrange for follow-up care as recommended to assure proper long-term care and prevention of further spine injury.  SEEK IMMEDIATE MEDICAL CARE IF:  You have increasing pain, vomiting, or are unable to move around at all.  You develop numbness, tingling, weakness, or paralysis of any part of your body.  You develop a loss of normal bowel or bladder control.  You have difficulty breathing, cough, fever, chest or abdominal pain. MAKE SURE YOU:   Understand these instructions.  Will watch your condition.  Will get help right away if you are not doing well or get worse. Document Released: 09/24/2004 Document Revised: 11/09/2011 Document Reviewed: 04/09/2009 [facility] Patient Information 2015  [place], [place]. This information is not intended to replace advice given to you by your health care provider. Make sure you discuss any questions you have with your health care provider.

## 2014-02-24 NOTE — ED Notes (Signed)
Family at bedside. 
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
## 2014-02-24 NOTE — ED Notes (Signed)
Pt has become increasingly agitated and has started yelling at staff members who enter the room.

## 2014-02-24 NOTE — ED Notes (Signed)
Patient gone to CT. 
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
## 2014-03-19 ENCOUNTER — Other Ambulatory Visit: Payer: Self-pay | Admitting: *Deleted

## 2014-03-19 ENCOUNTER — Telehealth: Payer: Self-pay | Admitting: Endocrinology

## 2014-03-19 ENCOUNTER — Ambulatory Visit: Payer: Medicare Other | Admitting: Thoracic Surgery (Cardiothoracic Vascular Surgery)

## 2014-03-19 MED ORDER — LORAZEPAM 2 MG PO TABS: ORAL_TABLET | ORAL | Status: DC

## 2014-03-19 MED ORDER — GLUCAGON (RDNA) 1 MG IJ KIT: 1.0000 mg | PACK | Freq: Once | INTRAMUSCULAR | Status: AC | PRN

## 2014-03-19 NOTE — Telephone Encounter (Signed)
rx sent to [facility].

## 2014-03-19 NOTE — Telephone Encounter (Signed)
[facility] [place] 
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
## 2014-03-30 ENCOUNTER — Emergency Department (HOSPITAL_COMMUNITY): Payer: Medicare Other

## 2014-03-30 ENCOUNTER — Inpatient Hospital Stay (HOSPITAL_COMMUNITY)
Admission: EM | Admit: 2014-03-30 | Discharge: 2014-04-06 | DRG: 071 | Disposition: A | Discharge status: Home / Self Care | Payer: Medicare Other | Attending: Internal Medicine | Admitting: Internal Medicine

## 2014-03-30 ENCOUNTER — Encounter (HOSPITAL_COMMUNITY): Payer: Self-pay | Admitting: Emergency Medicine

## 2014-03-30 DIAGNOSIS — Z91199 Patient's noncompliance with other medical treatment and regimen due to unspecified reason: Secondary | ICD-10-CM

## 2014-03-30 DIAGNOSIS — R0902 Hypoxemia: Secondary | ICD-10-CM

## 2014-03-30 DIAGNOSIS — I2589 Other forms of chronic ischemic heart disease: Secondary | ICD-10-CM | POA: Diagnosis present

## 2014-03-30 DIAGNOSIS — I658 Occlusion and stenosis of other precerebral arteries: Secondary | ICD-10-CM | POA: Diagnosis present

## 2014-03-30 DIAGNOSIS — F411 Generalized anxiety disorder: Secondary | ICD-10-CM

## 2014-03-30 DIAGNOSIS — F3289 Other specified depressive episodes: Secondary | ICD-10-CM

## 2014-03-30 DIAGNOSIS — E039 Hypothyroidism, unspecified: Secondary | ICD-10-CM

## 2014-03-30 DIAGNOSIS — I25119 Atherosclerotic heart disease of native coronary artery with unspecified angina pectoris: Secondary | ICD-10-CM

## 2014-03-30 DIAGNOSIS — F39 Unspecified mood [affective] disorder: Secondary | ICD-10-CM | POA: Diagnosis present

## 2014-03-30 DIAGNOSIS — E109 Type 1 diabetes mellitus without complications: Secondary | ICD-10-CM

## 2014-03-30 DIAGNOSIS — E876 Hypokalemia: Secondary | ICD-10-CM | POA: Diagnosis present

## 2014-03-30 DIAGNOSIS — E1069 Type 1 diabetes mellitus with other specified complication: Secondary | ICD-10-CM | POA: Diagnosis present

## 2014-03-30 DIAGNOSIS — I6509 Occlusion and stenosis of unspecified vertebral artery: Secondary | ICD-10-CM | POA: Diagnosis present

## 2014-03-30 DIAGNOSIS — F1410 Cocaine abuse, uncomplicated: Secondary | ICD-10-CM | POA: Diagnosis present

## 2014-03-30 DIAGNOSIS — E162 Hypoglycemia, unspecified: Secondary | ICD-10-CM | POA: Diagnosis present

## 2014-03-30 DIAGNOSIS — J9620 Acute and chronic respiratory failure, unspecified whether with hypoxia or hypercapnia: Secondary | ICD-10-CM

## 2014-03-30 DIAGNOSIS — N058 Unspecified nephritic syndrome with other morphologic changes: Secondary | ICD-10-CM | POA: Diagnosis present

## 2014-03-30 DIAGNOSIS — I255 Ischemic cardiomyopathy: Secondary | ICD-10-CM

## 2014-03-30 DIAGNOSIS — Z781 Physical restraint status: Secondary | ICD-10-CM | POA: Diagnosis present

## 2014-03-30 DIAGNOSIS — D509 Iron deficiency anemia, unspecified: Secondary | ICD-10-CM | POA: Diagnosis present

## 2014-03-30 DIAGNOSIS — E7800 Pure hypercholesterolemia, unspecified: Secondary | ICD-10-CM

## 2014-03-30 DIAGNOSIS — I509 Heart failure, unspecified: Secondary | ICD-10-CM | POA: Diagnosis present

## 2014-03-30 DIAGNOSIS — Z7982 Long term (current) use of aspirin: Secondary | ICD-10-CM

## 2014-03-30 DIAGNOSIS — A419 Sepsis, unspecified organism: Secondary | ICD-10-CM

## 2014-03-30 DIAGNOSIS — F1120 Opioid dependence, uncomplicated: Secondary | ICD-10-CM

## 2014-03-30 DIAGNOSIS — E785 Hyperlipidemia, unspecified: Secondary | ICD-10-CM

## 2014-03-30 DIAGNOSIS — J9601 Acute respiratory failure with hypoxia: Secondary | ICD-10-CM

## 2014-03-30 DIAGNOSIS — Z87312 Personal history of (healed) stress fracture: Secondary | ICD-10-CM

## 2014-03-30 DIAGNOSIS — G9340 Encephalopathy, unspecified: Principal | ICD-10-CM

## 2014-03-30 DIAGNOSIS — R0981 Nasal congestion: Secondary | ICD-10-CM

## 2014-03-30 DIAGNOSIS — R454 Irritability and anger: Secondary | ICD-10-CM

## 2014-03-30 DIAGNOSIS — E46 Unspecified protein-calorie malnutrition: Secondary | ICD-10-CM

## 2014-03-30 DIAGNOSIS — I2510 Atherosclerotic heart disease of native coronary artery without angina pectoris: Secondary | ICD-10-CM | POA: Diagnosis present

## 2014-03-30 DIAGNOSIS — Z9119 Patient's noncompliance with other medical treatment and regimen: Secondary | ICD-10-CM

## 2014-03-30 DIAGNOSIS — F17200 Nicotine dependence, unspecified, uncomplicated: Secondary | ICD-10-CM

## 2014-03-30 DIAGNOSIS — D62 Acute posthemorrhagic anemia: Secondary | ICD-10-CM

## 2014-03-30 DIAGNOSIS — N179 Acute kidney failure, unspecified: Secondary | ICD-10-CM

## 2014-03-30 DIAGNOSIS — R9439 Abnormal result of other cardiovascular function study: Secondary | ICD-10-CM

## 2014-03-30 DIAGNOSIS — Z794 Long term (current) use of insulin: Secondary | ICD-10-CM

## 2014-03-30 DIAGNOSIS — Z930 Tracheostomy status: Secondary | ICD-10-CM

## 2014-03-30 DIAGNOSIS — Z79899 Other long term (current) drug therapy: Secondary | ICD-10-CM

## 2014-03-30 DIAGNOSIS — F329 Major depressive disorder, single episode, unspecified: Secondary | ICD-10-CM | POA: Diagnosis present

## 2014-03-30 DIAGNOSIS — H409 Unspecified glaucoma: Secondary | ICD-10-CM | POA: Diagnosis present

## 2014-03-30 DIAGNOSIS — M25512 Pain in left shoulder: Secondary | ICD-10-CM

## 2014-03-30 DIAGNOSIS — I10 Essential (primary) hypertension: Secondary | ICD-10-CM

## 2014-03-30 DIAGNOSIS — G4733 Obstructive sleep apnea (adult) (pediatric): Secondary | ICD-10-CM

## 2014-03-30 DIAGNOSIS — R404 Transient alteration of awareness: Secondary | ICD-10-CM | POA: Diagnosis present

## 2014-03-30 DIAGNOSIS — I214 Non-ST elevation (NSTEMI) myocardial infarction: Secondary | ICD-10-CM

## 2014-03-30 DIAGNOSIS — G894 Chronic pain syndrome: Secondary | ICD-10-CM | POA: Diagnosis present

## 2014-03-30 DIAGNOSIS — F0781 Postconcussional syndrome: Secondary | ICD-10-CM | POA: Diagnosis present

## 2014-03-30 DIAGNOSIS — E1029 Type 1 diabetes mellitus with other diabetic kidney complication: Secondary | ICD-10-CM

## 2014-03-30 DIAGNOSIS — I252 Old myocardial infarction: Secondary | ICD-10-CM

## 2014-03-30 DIAGNOSIS — I4949 Other premature depolarization: Secondary | ICD-10-CM | POA: Diagnosis present

## 2014-03-30 DIAGNOSIS — R410 Disorientation, unspecified: Secondary | ICD-10-CM

## 2014-03-30 DIAGNOSIS — F29 Unspecified psychosis not due to a substance or known physiological condition: Secondary | ICD-10-CM | POA: Diagnosis not present

## 2014-03-30 DIAGNOSIS — Z8249 Family history of ischemic heart disease and other diseases of the circulatory system: Secondary | ICD-10-CM

## 2014-03-30 DIAGNOSIS — F332 Major depressive disorder, recurrent severe without psychotic features: Secondary | ICD-10-CM

## 2014-03-30 LAB — CBC WITH DIFFERENTIAL/PLATELET
Basophils Absolute: 0 10*3/uL (ref 0.0–0.1)
Basophils Relative: 0 % (ref 0–1)
Eosinophils Absolute: 0 10*3/uL (ref 0.0–0.7)
Eosinophils Relative: 0 % (ref 0–5)
HCT: 42.4 % (ref 39.0–52.0)
Hemoglobin: 14.5 g/dL (ref 13.0–17.0)
Lymphocytes Relative: 6 % — ABNORMAL LOW (ref 12–46)
Lymphs Abs: 0.8 10*3/uL (ref 0.7–4.0)
MCH: 31.3 pg (ref 26.0–34.0)
MCHC: 34.2 g/dL (ref 30.0–36.0)
MCV: 91.6 fL (ref 78.0–100.0)
Monocytes Absolute: 0.6 10*3/uL (ref 0.1–1.0)
Monocytes Relative: 4 % (ref 3–12)
Neutro Abs: 11.7 10*3/uL — ABNORMAL HIGH (ref 1.7–7.7)
Neutrophils Relative %: 90 % — ABNORMAL HIGH (ref 43–77)
Platelets: 396 10*3/uL (ref 150–400)
RBC: 4.63 MIL/uL (ref 4.22–5.81)
RDW: 14.7 % (ref 11.5–15.5)
WBC: 13.2 10*3/uL — ABNORMAL HIGH (ref 4.0–10.5)

## 2014-03-30 LAB — CBG MONITORING, ED: Glucose-Capillary: 233 mg/dL — ABNORMAL HIGH (ref 70–99)

## 2014-03-30 MED ORDER — SODIUM CHLORIDE 0.9 % IV SOLN
INTRAVENOUS | Status: DC
  Administered 2014-03-31: via INTRAVENOUS

## 2014-03-30 MED ORDER — LORAZEPAM 2 MG/ML IJ SOLN
1.0000 mg | Freq: Once | INTRAMUSCULAR | Status: AC
  Administered 2014-03-30: 1 mg via INTRAMUSCULAR

## 2014-03-30 MED ORDER — LORAZEPAM 2 MG/ML IJ SOLN
1.0000 mg | Freq: Once | INTRAMUSCULAR | Status: DC
  Filled 2014-03-30: qty 1

## 2014-03-30 NOTE — ED Notes (Signed)
Dr Juleen China at bedside for IV placement assistance.

## 2014-03-30 NOTE — ED Notes (Signed)
EKG given to EDP,Kohut,MD., for review. 
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
## 2014-03-30 NOTE — ED Notes (Signed)
Patient transported to CT 

## 2014-03-30 NOTE — ED Notes (Signed)
MD aware patient does not have IV access. MD states he will place [place] access.

## 2014-03-30 NOTE — ED Provider Notes (Signed)
CSN: [id number]     Arrival date & time 03/30/14  2258 History   First MD Initiated Contact with Patient 03/30/14 2259     Chief Complaint  Patient presents with  . Hypoglycemia     (Consider location/radiation/quality/duration/timing/severity/associated sxs/prior Treatment) HPI  47yM brought in by EMS with altered mental status. Timing not exactly clear to me. Apparently was either hypoglycemic or felt he might be and given IM glucagon by sister. Pt currently unable to give any history. Not following commands. Apparently was able to drink orange juice for EMS but I doubt he could this with the state he presented to ED in. No reported trauma. No reported ingestion.   Past Medical History  Diagnosis Date  . Diabetes mellitus type 1   . Hyperlipidemia   . Depression   . Umbilical hernia   . Hypertension   . Glaucoma   . CHF (congestive heart failure)   . Heart attack 12/16/11  . Hypothyroidism   . Hypoglycemia, unspecified   . Disorder of bone and cartilage, unspecified   . Acute posthemorrhagic anemia   . Anxiety state, unspecified   . Chronic pain syndrome   . Acute respiratory failure   . Dysphagia, oral phase   . Stress fracture of tibia or fibula   . Syncope and collapse   . Coronary artery disease 07/19/2012  . NSTEMI (non-ST elevated myocardial infarction) 12/19/2011  . Ischemic cardiomyopathy   . Narcotic dependency, continuous    Past Surgical History  Procedure Laterality Date  . Appendectomy      open  . Tibia im nail insertion  12/18/2011    Procedure: INTRAMEDULLARY (IM) NAIL TIBIAL;  Surgeon: Eugenia Mcalpine, MD;  Location: [facility] ORS;  Service: Orthopedics;  Laterality: Bilateral;  . Insertion of dialysis catheter  12/31/2011    Procedure: INSERTION OF DIALYSIS CATHETER;  Surgeon: Chuck Hint, MD;  Location: [facility] OR;  Service: Vascular;  Laterality: N/A;  . Tonsillectomy  1980  . Fracture surgery  2012    wrist  . Fracture surgery  2013    bilateral tibia    Family History  Problem Relation Age of Onset  . Heart disease Father    History  Substance Use Topics  . Smoking status: Current Some Day Smoker -- 1.00 packs/day    Types: Cigarettes  . Smokeless tobacco: Never Used  . Alcohol Use: No    Review of Systems  Level 5 caveat because pt is essentially nonverbal.   Allergies  Review of patient's allergies indicates no known allergies.  Home Medications   Prior to Admission medications   Medication Sig Start Date End Date Taking? Authorizing Provider  ACCU-CHEK AVIVA PLUS test strip TEST 6 TIMES A DAY AS DIRECTED. 05/04/13   Romero Belling, MD  aspirin EC 81 MG tablet Take 81 mg by mouth daily.    Historical Provider, MD  atorvastatin (LIPITOR) 40 MG tablet Take 40 mg by mouth daily.    Historical Provider, MD  B-D INS SYRINGE 0.5CC/30GX1/2" 30G X 1/2" 0.5 ML MISC use as directed 02/12/14   Reather Littler, MD  ferrous sulfate 325 (65 FE) MG tablet Take 325 mg by mouth daily with breakfast.    Historical Provider, MD  glucagon (GLUCAGON EMERGENCY) 1 MG injection Inject 1 mg into the muscle once as needed (for low blood sugar). 03/19/14   Romero Belling, MD  glucose blood test strip TEST BLOOD SUGAR 6 TIMES A DAY AS INSTRUCTED BY DR Everardo All 05/04/13  Romero Belling, MD  insulin glargine (LANTUS) 100 UNIT/ML injection Inject 35 Units into the skin daily.     Historical Provider, MD  insulin regular (NOVOLIN R,HUMULIN R) 100 units/mL injection Inject 0-0.05 mLs (0-5 Units total) into the skin 4 (four) times daily as needed for high blood sugar (cbg >200). Based on carb count 02/13/14   Romero Belling, MD  INSULIN SYRINGE .5CC/29G 29G X 1/2" 0.5 ML MISC 1 Syringe by Does not apply route daily. 05/18/13   Romero Belling, MD  levothyroxine (SYNTHROID, LEVOTHROID) 125 MCG tablet Take 1 tablet (125 mcg total) by mouth daily before breakfast. 09/04/13   Romero Belling, MD  lisinopril (PRINIVIL,ZESTRIL) 40 MG tablet Take 0.5 tablets (20 mg total) by mouth daily.  08/15/13   Oneal Grout, MD  LORazepam (ATIVAN) 2 MG tablet Take 1 tablet (2 mg) twice daily, may take an additional tablet during the day for anxiety if needed 03/19/14   Tiffany L Reed, DO  metoprolol tartrate (LOPRESSOR) 25 MG tablet Take 0.5 tablets (12.5 mg total) by mouth 2 (two) times daily. 08/15/13   Oneal Grout, MD  Multiple Vitamin (MULTIVITAMIN WITH MINERALS) TABS tablet Take 1 tablet by mouth daily. Diabetes daily pak from [employer]    Historical Provider, MD  oxyCODONE (ROXICODONE) 15 MG immediate release tablet Take 15-30 mg by mouth 5 (five) times daily as needed for pain.    Historical Provider, MD  Oxycodone HCl 20 MG TABS Take 1 tablet (20 mg total) by mouth 4 (four) times daily as needed. 02/24/14   Flint Melter, MD   BP 156/71  Pulse 97  Resp 20  SpO2 98% Physical Exam  Nursing note and vitals reviewed. HENT:  Head: Normocephalic and atraumatic.  Trach scar  Eyes: Conjunctivae are normal. Pupils are equal, round, and reactive to light. Right eye exhibits no discharge. Left eye exhibits no discharge.  Pupils ~40mm b/l. Symmetric. Reactive to light.   Neck: Neck supple.  Cardiovascular: Regular rhythm and normal heart sounds.  Exam reveals no gallop and no friction rub.   No murmur heard. Mild tachycardia  Pulmonary/Chest: Effort normal and breath sounds normal. No respiratory distress.  Abdominal: Soft. He exhibits no distension. There is no tenderness.  Reducible ventral hernia  Musculoskeletal: He exhibits no edema and no tenderness.  No external signs of acute trauma  Neurological:  Mostly laying in bed with eyes closed. Will occasionally open them spontaneously and seems to looks around room but doesn't fixate. Moving all extremities and sometimes flailing them. This increases with both verbal and tactile stimulation. Localizes painful stimuli. Not following commands. No facial droop. Frequent yawning/lip smacking.   Skin: Skin is warm and dry. He is not  diaphoretic.    ED Course  LUMBAR PUNCTURE Date/Time: 03/31/2014 2:20 AM Performed by: Raeford Razor Authorized by: Raeford Razor Risks and benefits discussed: pt encephalopathic. implied consent.  Required items: required blood products, implants, devices, and special equipment available Patient identity confirmed: provided demographic data and arm band Time out: Immediately prior to procedure a "time out" was called to verify the correct patient, procedure, equipment, support staff and site/side marked as required. Indications: evaluation for infection and evaluation for altered mental status Local anesthetic: lidocaine 1% without epinephrine Anesthetic total: 5 ml Patient sedated: yes Sedatives: midazolam Preparation: Patient was prepped and draped in the usual sterile fashion. Lumbar space: L4-L5 interspace Patient's position: left lateral decubitus Needle gauge: 20 Needle type: spinal needle - Quincke tip Needle length: 3.5 in Number  of attempts: 2 Fluid appearance: blood-tinged then clearing Tubes of fluid: 4 Total volume: 5 ml Post-procedure: adhesive bandage applied Patient tolerance: Patient tolerated the procedure well with no immediate complications.   (including critical care time)  Angiocath insertion Performed by: Raeford RazorKOHUT, [place]  Consent: Verbal consent obtained. Risks and benefits: risks, benefits and alternatives were discussed Time out: Immediately prior to procedure a "time out" was called to verify the correct patient, procedure, equipment, support staff and site/side marked as required.  Preparation: Patient was prepped and draped in the usual sterile fashion.  Vein Location: R antecubital  Ultrasound Guided  Gauge: 20 g  Normal blood return and flush without difficulty Patient tolerance: Patient tolerated the procedure well with no immediate complications.  CRITICAL CARE Performed by: Raeford RazorKOHUT, Gemini Beaumier  Total critical care time: 40  minutes  Critical care time was exclusive of separately billable procedures and treating other patients. Critical care was necessary to treat or prevent imminent or life-threatening deterioration. Critical care was time spent personally by me on the following activities: development of treatment plan with patient and/or surrogate as well as nursing, discussions with consultants, evaluation of patient's response to treatment, examination of patient, obtaining history from patient or surrogate, ordering and performing treatments and interventions, ordering and review of laboratory studies, ordering and review of radiographic studies, pulse oximetry and re-evaluation of patient's condition.     Labs Review Labs Reviewed  CBC WITH DIFFERENTIAL - Abnormal; Notable for the following:    WBC 13.2 (*)    Neutrophils Relative % 90 (*)    Neutro Abs 11.7 (*)    Lymphocytes Relative 6 (*)    All other components within normal limits  SALICYLATE LEVEL - Abnormal; Notable for the following:    Salicylate Lvl <2.0 (*)    All other components within normal limits  URINALYSIS, ROUTINE W REFLEX MICROSCOPIC - Abnormal; Notable for the following:    Specific Gravity, Urine 1.034 (*)    Glucose, UA 250 (*)    Ketones, ur 15 (*)    Protein, ur 30 (*)    All other components within normal limits  COMPREHENSIVE METABOLIC PANEL - Abnormal; Notable for the following:    Glucose, Bld 241 (*)    All other components within normal limits  CBG MONITORING, ED - Abnormal; Notable for the following:    Glucose-Capillary 233 (*)    All other components within normal limits  CBG MONITORING, ED - Abnormal; Notable for the following:    Glucose-Capillary 189 (*)    All other components within normal limits  CULTURE, BLOOD (ROUTINE X 2)  CULTURE, BLOOD (ROUTINE X 2)  CSF CULTURE  GRAM STAIN  URINE RAPID DRUG SCREEN (HOSP PERFORMED)  TROPONIN I  ACETAMINOPHEN LEVEL  MAGNESIUM  ETHANOL  URINE MICROSCOPIC-ADD ON   CSF CELL COUNT WITH DIFFERENTIAL  CSF CELL COUNT WITH DIFFERENTIAL  GLUCOSE, CSF  PROTEIN, CSF  HERPES SIMPLEX VIRUS(HSV) DNA BY PCR    Imaging Review No results found.   EKG Interpretation   Date/Time:  Friday March 30 2014 23:09:52 EDT Ventricular Rate:  99 PR Interval:  124 QRS Duration: 79 QT Interval:  403 QTC Calculation: 517 R Axis:   84 Text Interpretation:  Sinus rhythm Consider anterior infarct Prolonged QT  interval No significant change since last tracing Confirmed by Hildred Mollica  MD,  Pranshu Lyster ([id number]) on 03/31/2014 12:06:37 AM      MDM   Final diagnoses:  Sepsis, due to unspecified organism  Encephalopathy acute  47yM with AMS. Apparently EMS initially called for hypoglycemia. Received IM glucagon. Sugar now in 200s and pt still significantly altered. Unclear etiology. Behavior not typical of ETOH intoxication. Hx of narcotic dependency per review of records, but no myosis and seems more agitated/delirious. Frequent yawning and smacking lips. Post-ictal? Head bleed? Tox? Infectious? Metabolic? Trauma? Requiring physical/chemical restraints. Not following commands, but moving all extremities and strength is good. No facial droop. No reported trauma and no external signs of acute trauma on exam.  Mild tachycardia/HTN. Check rectal temp. Difficulty in obtaining peripheral access. Will look with [place]. I/O or central line if necessary. Will CT head. EKG. Labs including acetaminophen/salicylate. UDS.   Head CT with a lot of motion, but no serious findings per my review. Febrile. Empiric cefepime/vancomycin for sepsis. Blood cultures/CXR added.  No clear source of fever. Will LP.    Raeford Razor, MD 03/31/14 [phone]

## 2014-03-30 NOTE — ED Notes (Signed)
Bed: WA16 Expected date:  Expected time:  Means of arrival:  Comments: EMS 

## 2014-03-30 NOTE — ED Notes (Signed)
Additional blood tubes collected with IV start 1- light green 1- dark green 1- lavender  Sent to lab for clot hold

## 2014-03-31 ENCOUNTER — Emergency Department (HOSPITAL_COMMUNITY): Payer: Medicare Other

## 2014-03-31 DIAGNOSIS — F39 Unspecified mood [affective] disorder: Secondary | ICD-10-CM | POA: Diagnosis present

## 2014-03-31 DIAGNOSIS — F411 Generalized anxiety disorder: Secondary | ICD-10-CM | POA: Diagnosis present

## 2014-03-31 DIAGNOSIS — Z91199 Patient's noncompliance with other medical treatment and regimen due to unspecified reason: Secondary | ICD-10-CM | POA: Diagnosis not present

## 2014-03-31 DIAGNOSIS — I2589 Other forms of chronic ischemic heart disease: Secondary | ICD-10-CM | POA: Diagnosis present

## 2014-03-31 DIAGNOSIS — R410 Disorientation, unspecified: Secondary | ICD-10-CM | POA: Insufficient documentation

## 2014-03-31 DIAGNOSIS — A419 Sepsis, unspecified organism: Secondary | ICD-10-CM

## 2014-03-31 DIAGNOSIS — D509 Iron deficiency anemia, unspecified: Secondary | ICD-10-CM | POA: Diagnosis present

## 2014-03-31 DIAGNOSIS — I10 Essential (primary) hypertension: Secondary | ICD-10-CM | POA: Diagnosis present

## 2014-03-31 DIAGNOSIS — I509 Heart failure, unspecified: Secondary | ICD-10-CM | POA: Diagnosis present

## 2014-03-31 DIAGNOSIS — I4949 Other premature depolarization: Secondary | ICD-10-CM | POA: Diagnosis present

## 2014-03-31 DIAGNOSIS — N058 Unspecified nephritic syndrome with other morphologic changes: Secondary | ICD-10-CM | POA: Diagnosis present

## 2014-03-31 DIAGNOSIS — Z8249 Family history of ischemic heart disease and other diseases of the circulatory system: Secondary | ICD-10-CM | POA: Diagnosis not present

## 2014-03-31 DIAGNOSIS — E876 Hypokalemia: Secondary | ICD-10-CM | POA: Diagnosis present

## 2014-03-31 DIAGNOSIS — E039 Hypothyroidism, unspecified: Secondary | ICD-10-CM | POA: Diagnosis present

## 2014-03-31 DIAGNOSIS — E162 Hypoglycemia, unspecified: Secondary | ICD-10-CM | POA: Diagnosis present

## 2014-03-31 DIAGNOSIS — Z9119 Patient's noncompliance with other medical treatment and regimen: Secondary | ICD-10-CM | POA: Diagnosis not present

## 2014-03-31 DIAGNOSIS — F1410 Cocaine abuse, uncomplicated: Secondary | ICD-10-CM | POA: Diagnosis present

## 2014-03-31 DIAGNOSIS — H409 Unspecified glaucoma: Secondary | ICD-10-CM | POA: Diagnosis present

## 2014-03-31 DIAGNOSIS — E1029 Type 1 diabetes mellitus with other diabetic kidney complication: Secondary | ICD-10-CM | POA: Diagnosis present

## 2014-03-31 DIAGNOSIS — I658 Occlusion and stenosis of other precerebral arteries: Secondary | ICD-10-CM | POA: Diagnosis present

## 2014-03-31 DIAGNOSIS — F1920 Other psychoactive substance dependence, uncomplicated: Secondary | ICD-10-CM

## 2014-03-31 DIAGNOSIS — F29 Unspecified psychosis not due to a substance or known physiological condition: Secondary | ICD-10-CM | POA: Diagnosis present

## 2014-03-31 DIAGNOSIS — G894 Chronic pain syndrome: Secondary | ICD-10-CM | POA: Diagnosis present

## 2014-03-31 DIAGNOSIS — F332 Major depressive disorder, recurrent severe without psychotic features: Secondary | ICD-10-CM | POA: Diagnosis present

## 2014-03-31 DIAGNOSIS — E109 Type 1 diabetes mellitus without complications: Secondary | ICD-10-CM

## 2014-03-31 DIAGNOSIS — Z794 Long term (current) use of insulin: Secondary | ICD-10-CM | POA: Diagnosis not present

## 2014-03-31 DIAGNOSIS — R404 Transient alteration of awareness: Secondary | ICD-10-CM | POA: Diagnosis present

## 2014-03-31 DIAGNOSIS — G9340 Encephalopathy, unspecified: Secondary | ICD-10-CM | POA: Diagnosis present

## 2014-03-31 DIAGNOSIS — Z781 Physical restraint status: Secondary | ICD-10-CM | POA: Diagnosis present

## 2014-03-31 DIAGNOSIS — I2510 Atherosclerotic heart disease of native coronary artery without angina pectoris: Secondary | ICD-10-CM | POA: Diagnosis present

## 2014-03-31 DIAGNOSIS — F17200 Nicotine dependence, unspecified, uncomplicated: Secondary | ICD-10-CM | POA: Diagnosis present

## 2014-03-31 DIAGNOSIS — Z79899 Other long term (current) drug therapy: Secondary | ICD-10-CM | POA: Diagnosis not present

## 2014-03-31 DIAGNOSIS — I6509 Occlusion and stenosis of unspecified vertebral artery: Secondary | ICD-10-CM | POA: Diagnosis present

## 2014-03-31 DIAGNOSIS — F0781 Postconcussional syndrome: Secondary | ICD-10-CM | POA: Diagnosis present

## 2014-03-31 DIAGNOSIS — Z87312 Personal history of (healed) stress fracture: Secondary | ICD-10-CM | POA: Diagnosis not present

## 2014-03-31 DIAGNOSIS — Z7982 Long term (current) use of aspirin: Secondary | ICD-10-CM | POA: Diagnosis not present

## 2014-03-31 DIAGNOSIS — I252 Old myocardial infarction: Secondary | ICD-10-CM | POA: Diagnosis not present

## 2014-03-31 DIAGNOSIS — E1069 Type 1 diabetes mellitus with other specified complication: Secondary | ICD-10-CM | POA: Diagnosis present

## 2014-03-31 DIAGNOSIS — E785 Hyperlipidemia, unspecified: Secondary | ICD-10-CM | POA: Diagnosis present

## 2014-03-31 LAB — GRAM STAIN

## 2014-03-31 LAB — PROTIME-INR
INR: 1.08 (ref 0.00–1.49)
PROTHROMBIN TIME: 14 s (ref 11.6–15.2)

## 2014-03-31 LAB — URINALYSIS, ROUTINE W REFLEX MICROSCOPIC
Bilirubin Urine: NEGATIVE
Glucose, UA: 250 mg/dL — AB
Hgb urine dipstick: NEGATIVE
Ketones, ur: 15 mg/dL — AB
Leukocytes, UA: NEGATIVE
Nitrite: NEGATIVE
Protein, ur: 30 mg/dL — AB
Specific Gravity, Urine: 1.034 — ABNORMAL HIGH (ref 1.005–1.030)
Urobilinogen, UA: 0.2 mg/dL (ref 0.0–1.0)
pH: 6 (ref 5.0–8.0)

## 2014-03-31 LAB — HERPES SIMPLEX VIRUS(HSV) DNA BY PCR
HSV 1 DNA: NOT DETECTED
HSV 2 DNA: NOT DETECTED

## 2014-03-31 LAB — CBC
HCT: 37.3 % — ABNORMAL LOW (ref 39.0–52.0)
Hemoglobin: 12.4 g/dL — ABNORMAL LOW (ref 13.0–17.0)
MCH: 30.7 pg (ref 26.0–34.0)
MCHC: 33.2 g/dL (ref 30.0–36.0)
MCV: 92.3 fL (ref 78.0–100.0)
PLATELETS: 279 10*3/uL (ref 150–400)
RBC: 4.04 MIL/uL — AB (ref 4.22–5.81)
RDW: 14.7 % (ref 11.5–15.5)
WBC: 11.4 10*3/uL — ABNORMAL HIGH (ref 4.0–10.5)

## 2014-03-31 LAB — RAPID URINE DRUG SCREEN, HOSP PERFORMED
Amphetamines: NOT DETECTED
Barbiturates: NOT DETECTED
Benzodiazepines: NOT DETECTED
Cocaine: NOT DETECTED
Opiates: NOT DETECTED
Tetrahydrocannabinol: NOT DETECTED

## 2014-03-31 LAB — COMPREHENSIVE METABOLIC PANEL
ALT: 15 U/L (ref 0–53)
ALT: 14 U/L (ref 0–53)
ANION GAP: 13 (ref 5–15)
AST: 19 U/L (ref 0–37)
AST: 17 U/L (ref 0–37)
Albumin: 3.9 g/dL (ref 3.5–5.2)
Albumin: 3.7 g/dL (ref 3.5–5.2)
Alkaline Phosphatase: 101 U/L (ref 39–117)
Alkaline Phosphatase: 98 U/L (ref 39–117)
Anion gap: 13 (ref 5–15)
BUN: 22 mg/dL (ref 6–23)
BUN: 18 mg/dL (ref 6–23)
CALCIUM: 9.7 mg/dL (ref 8.4–10.5)
CO2: 25 mEq/L (ref 19–32)
CO2: 24 mEq/L (ref 19–32)
CREATININE: 0.87 mg/dL (ref 0.50–1.35)
Calcium: 9.7 mg/dL (ref 8.4–10.5)
Chloride: 101 mEq/L (ref 96–112)
Chloride: 106 mEq/L (ref 96–112)
Creatinine, Ser: 0.85 mg/dL (ref 0.50–1.35)
GFR calc Af Amer: >90 mL/min (ref >90)
GFR calc non Af Amer: >90 mL/min (ref >90)
GLUCOSE: 144 mg/dL — AB (ref 70–99)
Glucose, Bld: 241 mg/dL — ABNORMAL HIGH (ref 70–99)
Potassium: 4.3 mEq/L (ref 3.7–5.3)
Potassium: 4.7 mEq/L (ref 3.7–5.3)
SODIUM: 143 meq/L (ref 137–147)
Sodium: 139 mEq/L (ref 137–147)
Total Bilirubin: 0.3 mg/dL (ref 0.3–1.2)
Total Bilirubin: 0.4 mg/dL (ref 0.3–1.2)
Total Protein: 7.3 g/dL (ref 6.0–8.3)
Total Protein: 7.1 g/dL (ref 6.0–8.3)

## 2014-03-31 LAB — CSF CELL COUNT WITH DIFFERENTIAL
RBC Count, CSF: 9 /mm3 — ABNORMAL HIGH
RBC Count, CSF: 940 /mm3 — ABNORMAL HIGH
Tube #: 4
Tube #: 1
WBC, CSF: 6 /mm3 — ABNORMAL HIGH (ref 0–5)
WBC, CSF: 4 /mm3 (ref 0–5)

## 2014-03-31 LAB — MRSA PCR SCREENING: MRSA by PCR: NEGATIVE

## 2014-03-31 LAB — TROPONIN I
Troponin I: <0.3 ng/mL (ref <0.30)
Troponin I: <0.3 ng/mL (ref <0.30)

## 2014-03-31 LAB — AMMONIA: Ammonia: 19 umol/L (ref 11–60)

## 2014-03-31 LAB — GLUCOSE, CAPILLARY
GLUCOSE-CAPILLARY: 161 mg/dL — AB (ref 70–99)
GLUCOSE-CAPILLARY: 511 mg/dL — AB (ref 70–99)
GLUCOSE-CAPILLARY: 400 mg/dL — AB (ref 70–99)
Glucose-Capillary: 195 mg/dL — ABNORMAL HIGH (ref 70–99)
Glucose-Capillary: 48 mg/dL — ABNORMAL LOW (ref 70–99)
Glucose-Capillary: 235 mg/dL — ABNORMAL HIGH (ref 70–99)
Glucose-Capillary: 352 mg/dL — ABNORMAL HIGH (ref 70–99)
Glucose-Capillary: 116 mg/dL — ABNORMAL HIGH (ref 70–99)

## 2014-03-31 LAB — URINE MICROSCOPIC-ADD ON

## 2014-03-31 LAB — MAGNESIUM: Magnesium: 2 mg/dL (ref 1.5–2.5)

## 2014-03-31 LAB — ETHANOL: Alcohol, Ethyl (B): <11 mg/dL (ref 0–11)

## 2014-03-31 LAB — CBG MONITORING, ED
Glucose-Capillary: 189 mg/dL — ABNORMAL HIGH (ref 70–99)
Glucose-Capillary: 91 mg/dL (ref 70–99)

## 2014-03-31 LAB — GLUCOSE, CSF: Glucose, CSF: 99 mg/dL — ABNORMAL HIGH (ref 43–76)

## 2014-03-31 LAB — ACETAMINOPHEN LEVEL
Acetaminophen (Tylenol), Serum: <15 ug/mL (ref 10–30)
Acetaminophen (Tylenol), Serum: <15 ug/mL (ref 10–30)

## 2014-03-31 LAB — C-REACTIVE PROTEIN: CRP: <0.5 mg/dL — ABNORMAL LOW (ref <0.60)

## 2014-03-31 LAB — PROTEIN, CSF: Total  Protein, CSF: 46 mg/dL — ABNORMAL HIGH (ref 15–45)

## 2014-03-31 LAB — SEDIMENTATION RATE: SED RATE: 20 mm/h — AB (ref 0–16)

## 2014-03-31 LAB — SALICYLATE LEVEL: Salicylate Lvl: <2 mg/dL — ABNORMAL LOW (ref 2.8–20.0)

## 2014-03-31 MED ORDER — LISINOPRIL 20 MG PO TABS
20.0000 mg | ORAL_TABLET | Freq: Every day | ORAL | Status: DC
  Administered 2014-04-02 – 2014-04-06 (×5): 20 mg via ORAL
  Filled 2014-03-31 (×3): qty 1
  Filled 2014-03-31 (×3): qty 2

## 2014-03-31 MED ORDER — SODIUM CHLORIDE 0.9 % IV SOLN
INTRAVENOUS | Status: DC
  Administered 2014-03-31: 20:00:00 via INTRAVENOUS

## 2014-03-31 MED ORDER — INSULIN ASPART 100 UNIT/ML ~~LOC~~ SOLN
6.0000 [IU] | Freq: Once | SUBCUTANEOUS | Status: AC
  Administered 2014-03-31: 6 [IU] via SUBCUTANEOUS

## 2014-03-31 MED ORDER — ENOXAPARIN SODIUM 40 MG/0.4ML ~~LOC~~ SOLN
40.0000 mg | SUBCUTANEOUS | Status: DC
  Administered 2014-04-01 – 2014-04-05 (×5): 40 mg via SUBCUTANEOUS
  Filled 2014-03-31 (×6): qty 0.4

## 2014-03-31 MED ORDER — ATORVASTATIN CALCIUM 40 MG PO TABS
40.0000 mg | ORAL_TABLET | Freq: Every day | ORAL | Status: DC
  Administered 2014-04-01 – 2014-04-05 (×5): 40 mg via ORAL
  Filled 2014-03-31 (×6): qty 1

## 2014-03-31 MED ORDER — DEXTROSE 5 % IV SOLN
2.0000 g | Freq: Once | INTRAVENOUS | Status: AC
  Administered 2014-03-31: 2 g via INTRAVENOUS
  Filled 2014-03-31: qty 2

## 2014-03-31 MED ORDER — DEXTROSE 50 % IV SOLN: 25.0000 mL | Freq: Once | INTRAVENOUS | Status: AC | PRN

## 2014-03-31 MED ORDER — DEXTROSE 5 % IV SOLN
10.0000 mg/kg | Freq: Three times a day (TID) | INTRAVENOUS | Status: DC
  Administered 2014-03-31 – 2014-04-01 (×4): 740 mg via INTRAVENOUS
  Filled 2014-03-31 (×5): qty 14.8

## 2014-03-31 MED ORDER — SODIUM CHLORIDE 0.9 % IJ SOLN
3.0000 mL | Freq: Two times a day (BID) | INTRAMUSCULAR | Status: DC
  Administered 2014-03-31 – 2014-04-03 (×3): 3 mL via INTRAVENOUS

## 2014-03-31 MED ORDER — SODIUM CHLORIDE 0.9 % IV SOLN
INTRAVENOUS | Status: DC
  Administered 2014-03-31: 75 mL/h via INTRAVENOUS

## 2014-03-31 MED ORDER — LEVOTHYROXINE SODIUM 125 MCG PO TABS
125.0000 ug | ORAL_TABLET | Freq: Every day | ORAL | Status: DC
  Administered 2014-04-01 – 2014-04-06 (×6): 125 ug via ORAL
  Filled 2014-03-31 (×11): qty 1

## 2014-03-31 MED ORDER — ACETAMINOPHEN 325 MG PO TABS
650.0000 mg | ORAL_TABLET | Freq: Four times a day (QID) | ORAL | Status: DC | PRN
  Administered 2014-04-04 – 2014-04-05 (×2): 650 mg via ORAL
  Filled 2014-03-31 (×2): qty 2

## 2014-03-31 MED ORDER — METOPROLOL TARTRATE 12.5 MG HALF TABLET
12.5000 mg | ORAL_TABLET | Freq: Two times a day (BID) | ORAL | Status: DC
  Administered 2014-04-01 – 2014-04-06 (×10): 12.5 mg via ORAL
  Filled 2014-03-31 (×12): qty 1

## 2014-03-31 MED ORDER — LORAZEPAM 2 MG/ML IJ SOLN: 0.5000 mg | INTRAMUSCULAR | Status: DC | PRN

## 2014-03-31 MED ORDER — MIDAZOLAM HCL 2 MG/2ML IJ SOLN
INTRAMUSCULAR | Status: AC
  Filled 2014-03-31: qty 2

## 2014-03-31 MED ORDER — VANCOMYCIN HCL 10 G IV SOLR
1500.0000 mg | Freq: Once | INTRAVENOUS | Status: AC
  Administered 2014-03-31: 1500 mg via INTRAVENOUS
  Filled 2014-03-31: qty 1500

## 2014-03-31 MED ORDER — ASPIRIN EC 81 MG PO TBEC
81.0000 mg | DELAYED_RELEASE_TABLET | Freq: Every day | ORAL | Status: DC
  Administered 2014-04-01 – 2014-04-06 (×6): 81 mg via ORAL
  Filled 2014-03-31 (×6): qty 1

## 2014-03-31 MED ORDER — LORAZEPAM 1 MG PO TABS
2.0000 mg | ORAL_TABLET | Freq: Two times a day (BID) | ORAL | Status: DC
  Administered 2014-04-01 – 2014-04-03 (×5): 2 mg via ORAL
  Filled 2014-03-31 (×5): qty 2

## 2014-03-31 MED ORDER — DEXTROSE-NACL 5-0.9 % IV SOLN
INTRAVENOUS | Status: DC
Start: 2014-03-31 — End: 2014-03-31
  Administered 2014-03-31: 09:00:00 via INTRAVENOUS

## 2014-03-31 MED ORDER — SODIUM CHLORIDE 0.9 % IV SOLN
INTRAVENOUS | Status: DC
  Administered 2014-03-31: 05:00:00 via INTRAVENOUS

## 2014-03-31 MED ORDER — HALOPERIDOL LACTATE 5 MG/ML IJ SOLN
5.0000 mg | Freq: Four times a day (QID) | INTRAMUSCULAR | Status: DC | PRN
  Administered 2014-03-31 – 2014-04-01 (×6): 5 mg via INTRAVENOUS
  Filled 2014-03-31 (×6): qty 1

## 2014-03-31 MED ORDER — ONDANSETRON HCL 4 MG/2ML IJ SOLN: 4.0000 mg | Freq: Four times a day (QID) | INTRAMUSCULAR | Status: DC | PRN

## 2014-03-31 MED ORDER — METOPROLOL TARTRATE 1 MG/ML IV SOLN: 2.5000 mg | Freq: Four times a day (QID) | INTRAVENOUS | Status: DC | PRN

## 2014-03-31 MED ORDER — ONDANSETRON HCL 4 MG PO TABS: 4.0000 mg | ORAL_TABLET | Freq: Four times a day (QID) | ORAL | Status: DC | PRN

## 2014-03-31 MED ORDER — DEXTROSE 5 % IV SOLN
2.0000 g | Freq: Two times a day (BID) | INTRAVENOUS | Status: DC
  Administered 2014-03-31 – 2014-04-05 (×10): 2 g via INTRAVENOUS
  Filled 2014-03-31 (×12): qty 2

## 2014-03-31 MED ORDER — DEXTROSE 50 % IV SOLN
50.0000 mL | Freq: Once | INTRAVENOUS | Status: AC | PRN
  Administered 2014-03-31: 50 mL via INTRAVENOUS
  Filled 2014-03-31: qty 50

## 2014-03-31 MED ORDER — ACETAMINOPHEN 650 MG RE SUPP
650.0000 mg | Freq: Once | RECTAL | Status: AC
  Administered 2014-03-31: 650 mg via RECTAL
  Filled 2014-03-31: qty 1

## 2014-03-31 MED ORDER — LIDOCAINE HCL 2 % IJ SOLN
INTRAMUSCULAR | Status: AC
  Administered 2014-03-31: 400 mg
  Filled 2014-03-31: qty 20

## 2014-03-31 MED ORDER — ACETAMINOPHEN 650 MG RE SUPP: 650.0000 mg | Freq: Four times a day (QID) | RECTAL | Status: DC | PRN

## 2014-03-31 MED ORDER — LORAZEPAM 2 MG/ML IJ SOLN
0.5000 mg | INTRAMUSCULAR | Status: DC | PRN
  Administered 2014-03-31 – 2014-04-06 (×15): 1 mg via INTRAVENOUS
  Filled 2014-03-31 (×16): qty 1

## 2014-03-31 MED ORDER — FERROUS SULFATE 325 (65 FE) MG PO TABS
325.0000 mg | ORAL_TABLET | Freq: Every day | ORAL | Status: DC
  Administered 2014-04-01 – 2014-04-06 (×6): 325 mg via ORAL
  Filled 2014-03-31 (×8): qty 1

## 2014-03-31 MED ORDER — INSULIN ASPART 100 UNIT/ML ~~LOC~~ SOLN: 0.0000 [IU] | Freq: Four times a day (QID) | SUBCUTANEOUS | Status: DC

## 2014-03-31 MED ORDER — VANCOMYCIN HCL IN DEXTROSE 750-5 MG/150ML-% IV SOLN
750.0000 mg | Freq: Three times a day (TID) | INTRAVENOUS | Status: DC
  Administered 2014-03-31 – 2014-04-05 (×16): 750 mg via INTRAVENOUS
  Filled 2014-03-31 (×17): qty 150

## 2014-03-31 MED ORDER — INSULIN ASPART 100 UNIT/ML ~~LOC~~ SOLN
0.0000 [IU] | SUBCUTANEOUS | Status: DC
  Administered 2014-03-31: 9 [IU] via SUBCUTANEOUS
  Administered 2014-03-31: 5 [IU] via SUBCUTANEOUS
  Administered 2014-04-01: 7 [IU] via SUBCUTANEOUS
  Administered 2014-04-01: 3 [IU] via SUBCUTANEOUS
  Administered 2014-04-01: 5 [IU] via SUBCUTANEOUS
  Administered 2014-04-01: 2 [IU] via SUBCUTANEOUS
  Administered 2014-04-01 – 2014-04-02 (×3): 3 [IU] via SUBCUTANEOUS
  Administered 2014-04-02: 5 [IU] via SUBCUTANEOUS
  Administered 2014-04-02 (×3): 7 [IU] via SUBCUTANEOUS
  Administered 2014-04-03: 3 [IU] via SUBCUTANEOUS
  Administered 2014-04-03: 7 [IU] via SUBCUTANEOUS
  Administered 2014-04-04: 1 [IU] via SUBCUTANEOUS
  Administered 2014-04-04: 3 [IU] via SUBCUTANEOUS
  Administered 2014-04-04 (×2): 2 [IU] via SUBCUTANEOUS
  Administered 2014-04-04: 1 [IU] via SUBCUTANEOUS
  Administered 2014-04-05: 3 [IU] via SUBCUTANEOUS
  Administered 2014-04-05: 2 [IU] via SUBCUTANEOUS
  Administered 2014-04-06: 1 [IU] via SUBCUTANEOUS
  Administered 2014-04-06: 2 [IU] via SUBCUTANEOUS

## 2014-03-31 MED ORDER — MIDAZOLAM HCL 2 MG/2ML IJ SOLN
4.0000 mg | Freq: Once | INTRAMUSCULAR | Status: AC
  Administered 2014-03-31: 4 mg via INTRAVENOUS
  Filled 2014-03-31: qty 4

## 2014-03-31 NOTE — ED Notes (Signed)
Pt. CBG 91. RN,Amy notified.
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
## 2014-03-31 NOTE — ED Notes (Signed)
Patient alert, unable to follow commands, only word patient is stating is "damn". Patient rocking in bed, kicking legs. Pupils equal, round, reactive to light. MD at bedside

## 2014-03-31 NOTE — ED Notes (Signed)
Patient sister called requesting update. Advised patient was here, stable, and being considered for admission. She confirms understanding do to HIPPA we can not disclose any additional information over the phone.

## 2014-03-31 NOTE — Progress Notes (Signed)
ANTIBIOTIC CONSULT NOTE - INITIAL  Pharmacy Consult for Vancomycin, acyclovir, ceftriaxone Indication: possible meningitis/encephalitis.  No Known Allergies  Patient Measurements: Height: 5' 6.93" (170 cm) Weight: 163 lb 2.3 oz (74 kg) IBW/kg (Calculated) : 65.94 Adjusted Body Weight:   Vital Signs: Temp: 99.9 F (37.7 C) (08/01 0339) Temp src: Oral (08/01 0339) BP: 144/70 mmHg (08/01 0339) Pulse Rate: 86 (08/01 0339) Intake/Output from previous day: 07/31 0701 - 08/01 0700 In: 164.8 [IV Piggyback:164.8] Out: -  Intake/Output from this shift: Total I/O In: 164.8 [IV Piggyback:164.8] Out: -   Labs:  Recent Labs  03/29/14 2349 03/30/14 2325  WBC  --  13.2*  HGB  --  14.5  PLT  --  396  CREATININE 0.85  --    Estimated Creatinine Clearance: 100.1 ml/min (by C-G formula based on Cr of 0.85). No results found for this basename: VANCOTROUGH, VANCOPEAK, VANCORANDOM, GENTTROUGH, GENTPEAK, GENTRANDOM, TOBRATROUGH, TOBRAPEAK, TOBRARND, AMIKACINPEAK, AMIKACINTROU, AMIKACIN,  in the last 72 hours   Microbiology: Recent Results (from the past 720 hour(s))  GRAM STAIN     Status: None   Collection Time    03/31/14  2:52 AM      Result Value Ref Range Status   Specimen Description CSF   Final   Special Requests NONE   Final   Gram Stain     Final   Value: RARE WBC PRESENT,BOTH PMN AND MONONUCLEAR     NO ORGANISMS SEEN     CYTOSPIN     Gram Stain Report Called to,Read Back By and Verified With: J.OXENDINE,RN AT 0423 ON 03/31/14 [facility]   Report Status 03/31/2014 FINAL   Final    Medical History: Past Medical History  Diagnosis Date  . Diabetes mellitus type 1   . Hyperlipidemia   . Depression   . Umbilical hernia   . Hypertension   . Glaucoma   . CHF (congestive heart failure)   . Heart attack 12/16/11  . Hypothyroidism   . Hypoglycemia, unspecified   . Disorder of bone and cartilage, unspecified   . Acute posthemorrhagic anemia   . Anxiety state, unspecified    . Chronic pain syndrome   . Acute respiratory failure   . Dysphagia, oral phase   . Stress fracture of tibia or fibula   . Syncope and collapse   . Coronary artery disease 07/19/2012  . NSTEMI (non-ST elevated myocardial infarction) 12/19/2011  . Ischemic cardiomyopathy   . Narcotic dependency, continuous     Medications:  Anti-infectives   Start     Dose/Rate Route Frequency Ordered Stop   03/31/14 1800  cefTRIAXone (ROCEPHIN) 2 g in dextrose 5 % 50 mL IVPB     2 g 100 mL/hr over 30 Minutes Intravenous Every 12 hours 03/31/14 0354     03/31/14 1400  vancomycin (VANCOCIN) IVPB 750 mg/150 ml premix     750 mg 150 mL/hr over 60 Minutes Intravenous Every 8 hours 03/31/14 0543     03/31/14 0400  cefTRIAXone (ROCEPHIN) 2 g in dextrose 5 % 50 mL IVPB     2 g 100 mL/hr over 30 Minutes Intravenous  Once 03/31/14 0354     03/31/14 0400  acyclovir (ZOVIRAX) 740 mg in dextrose 5 % 150 mL IVPB     10 mg/kg  74 kg 164.8 mL/hr over 60 Minutes Intravenous 3 times per day 03/31/14 0354     03/31/14 0030  vancomycin (VANCOCIN) 1,500 mg in sodium chloride 0.9 % 500 mL IVPB  1,500 mg 250 mL/hr over 120 Minutes Intravenous  Once 03/31/14 0018 03/31/14 0336   03/31/14 0030  ceFEPIme (MAXIPIME) 2 g in dextrose 5 % 50 mL IVPB     2 g 100 mL/hr over 30 Minutes Intravenous  Once 03/31/14 0018 03/31/14 0121     Assessment: Patient with possible meningitis/encephalitis.  First dose of antibiotics already ordered.  Goal of Therapy:  Vancomycin trough level 15-20 mcg/ml Acyclovir, ceftriaxone dosed based on patient weight and renal function   Plan:  Measure antibiotic drug levels at steady state Follow up culture results Vancomycin 750mg  iv q8hr Acyclovir 740mg  iv q8hr Ceftriaxone 2gm iv q12hr  Aleene Davidson Crowford 03/31/2014,5:46 AM

## 2014-03-31 NOTE — ED Notes (Signed)
Dr Juleen China at bedside for procedure, assisted by Kieth Brightly, RN
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
## 2014-03-31 NOTE — Progress Notes (Signed)
Patient was placed in restraints at 0830 on 03/31/14 after a verbal order was given to Len Blalock, RN and Volanda Napoleon, RN. MD stated that she would put the orders in. MD did not place the order after verbal order was given. Patient safety and dignity was maintained and MD was notified as soon as oversight was detected and MD immediately wrote the order. Order was renewed at 1800 for the next 24 hours. Restraints were charted on appropriately.

## 2014-04-01 ENCOUNTER — Encounter (HOSPITAL_COMMUNITY): Payer: Self-pay | Admitting: *Deleted

## 2014-04-01 DIAGNOSIS — F411 Generalized anxiety disorder: Secondary | ICD-10-CM

## 2014-04-01 LAB — GLUCOSE, CAPILLARY
GLUCOSE-CAPILLARY: 44 mg/dL — AB (ref 70–99)
GLUCOSE-CAPILLARY: 141 mg/dL — AB (ref 70–99)
GLUCOSE-CAPILLARY: 161 mg/dL — AB (ref 70–99)
GLUCOSE-CAPILLARY: 312 mg/dL — AB (ref 70–99)
Glucose-Capillary: 127 mg/dL — ABNORMAL HIGH (ref 70–99)
Glucose-Capillary: 89 mg/dL (ref 70–99)
Glucose-Capillary: 278 mg/dL — ABNORMAL HIGH (ref 70–99)
Glucose-Capillary: 249 mg/dL — ABNORMAL HIGH (ref 70–99)
Glucose-Capillary: 182 mg/dL — ABNORMAL HIGH (ref 70–99)
Glucose-Capillary: 137 mg/dL — ABNORMAL HIGH (ref 70–99)
Glucose-Capillary: 218 mg/dL — ABNORMAL HIGH (ref 70–99)
Glucose-Capillary: 219 mg/dL — ABNORMAL HIGH (ref 70–99)

## 2014-04-01 LAB — BASIC METABOLIC PANEL
ANION GAP: 15 (ref 5–15)
BUN: 11 mg/dL (ref 6–23)
CALCIUM: 9.2 mg/dL (ref 8.4–10.5)
CHLORIDE: 103 meq/L (ref 96–112)
CO2: 20 meq/L (ref 19–32)
Creatinine, Ser: 0.76 mg/dL (ref 0.50–1.35)
GFR calc Af Amer: >90 mL/min (ref >90)
GFR calc non Af Amer: >90 mL/min (ref >90)
GLUCOSE: 238 mg/dL — AB (ref 70–99)
Potassium: 4.3 mEq/L (ref 3.7–5.3)
Sodium: 138 mEq/L (ref 137–147)

## 2014-04-01 LAB — VANCOMYCIN, TROUGH: VANCOMYCIN TR: 18.8 ug/mL (ref 10.0–20.0)

## 2014-04-01 MED ORDER — DEXTROSE 50 % IV SOLN
INTRAVENOUS | Status: AC
  Administered 2014-04-01: 25 mL
  Filled 2014-04-01: qty 50

## 2014-04-01 MED ORDER — DEXTROSE 50 % IV SOLN: 25.0000 mL | Freq: Once | INTRAVENOUS | Status: AC | PRN

## 2014-04-01 MED ORDER — CHLORHEXIDINE GLUCONATE 0.12 % MT SOLN
15.0000 mL | Freq: Two times a day (BID) | OROMUCOSAL | Status: DC
  Administered 2014-04-01 – 2014-04-06 (×11): 15 mL via OROMUCOSAL
  Filled 2014-04-01 (×14): qty 15

## 2014-04-01 MED ORDER — CETYLPYRIDINIUM CHLORIDE 0.05 % MT LIQD
7.0000 mL | Freq: Two times a day (BID) | OROMUCOSAL | Status: DC
  Administered 2014-04-01 – 2014-04-05 (×3): 7 mL via OROMUCOSAL

## 2014-04-01 MED ORDER — DEXTROSE-NACL 5-0.9 % IV SOLN
INTRAVENOUS | Status: DC
  Administered 2014-04-01 – 2014-04-04 (×4): via INTRAVENOUS
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
## 2014-04-02 ENCOUNTER — Other Ambulatory Visit (HOSPITAL_COMMUNITY): Payer: Medicaid Other

## 2014-04-02 ENCOUNTER — Inpatient Hospital Stay (HOSPITAL_COMMUNITY): Payer: Medicare Other

## 2014-04-02 LAB — BASIC METABOLIC PANEL
Anion gap: 14 (ref 5–15)
BUN: 9 mg/dL (ref 6–23)
CALCIUM: 9.4 mg/dL (ref 8.4–10.5)
CHLORIDE: 106 meq/L (ref 96–112)
CO2: 20 meq/L (ref 19–32)
CREATININE: 0.72 mg/dL (ref 0.50–1.35)
GFR calc non Af Amer: >90 mL/min (ref >90)
Glucose, Bld: 246 mg/dL — ABNORMAL HIGH (ref 70–99)
Potassium: 3.4 mEq/L — ABNORMAL LOW (ref 3.7–5.3)
SODIUM: 140 meq/L (ref 137–147)

## 2014-04-02 LAB — CBC
HCT: 34.4 % — ABNORMAL LOW (ref 39.0–52.0)
Hemoglobin: 11.7 g/dL — ABNORMAL LOW (ref 13.0–17.0)
MCH: 30.2 pg (ref 26.0–34.0)
MCHC: 34 g/dL (ref 30.0–36.0)
MCV: 88.7 fL (ref 78.0–100.0)
Platelets: 307 10*3/uL (ref 150–400)
RBC: 3.88 MIL/uL — AB (ref 4.22–5.81)
RDW: 14.4 % (ref 11.5–15.5)
WBC: 9.2 10*3/uL (ref 4.0–10.5)

## 2014-04-02 LAB — GLUCOSE, CAPILLARY
GLUCOSE-CAPILLARY: 66 mg/dL — AB (ref 70–99)
GLUCOSE-CAPILLARY: 104 mg/dL — AB (ref 70–99)
GLUCOSE-CAPILLARY: 249 mg/dL — AB (ref 70–99)
GLUCOSE-CAPILLARY: 296 mg/dL — AB (ref 70–99)
GLUCOSE-CAPILLARY: 350 mg/dL — AB (ref 70–99)
GLUCOSE-CAPILLARY: 255 mg/dL — AB (ref 70–99)
Glucose-Capillary: 133 mg/dL — ABNORMAL HIGH (ref 70–99)
Glucose-Capillary: 346 mg/dL — ABNORMAL HIGH (ref 70–99)
Glucose-Capillary: 128 mg/dL — ABNORMAL HIGH (ref 70–99)
Glucose-Capillary: 311 mg/dL — ABNORMAL HIGH (ref 70–99)
Glucose-Capillary: 323 mg/dL — ABNORMAL HIGH (ref 70–99)
Glucose-Capillary: 309 mg/dL — ABNORMAL HIGH (ref 70–99)

## 2014-04-02 MED ORDER — INSULIN GLARGINE 100 UNIT/ML ~~LOC~~ SOLN
20.0000 [IU] | Freq: Every day | SUBCUTANEOUS | Status: DC
  Administered 2014-04-02: 20 [IU] via SUBCUTANEOUS
  Filled 2014-04-02 (×2): qty 0.2

## 2014-04-02 MED ORDER — NICOTINE 21 MG/24HR TD PT24: 21.0000 mg | MEDICATED_PATCH | Freq: Every day | TRANSDERMAL | Status: DC

## 2014-04-02 MED ORDER — DEXTROSE 50 % IV SOLN: 1.0000 | Freq: Once | INTRAVENOUS | Status: DC

## 2014-04-02 MED ORDER — POTASSIUM CHLORIDE IN NACL 20-0.9 MEQ/L-% IV SOLN
INTRAVENOUS | Status: DC
  Administered 2014-04-02 – 2014-04-03 (×3): via INTRAVENOUS
  Filled 2014-04-02 (×4): qty 1000

## 2014-04-02 MED ORDER — NICOTINE 21 MG/24HR TD PT24
21.0000 mg | MEDICATED_PATCH | Freq: Every day | TRANSDERMAL | Status: DC
  Administered 2014-04-02 – 2014-04-06 (×5): 21 mg via TRANSDERMAL
  Filled 2014-04-02 (×5): qty 1

## 2014-04-02 MED ORDER — INSULIN GLARGINE 100 UNIT/ML ~~LOC~~ SOLN
35.0000 [IU] | Freq: Every day | SUBCUTANEOUS | Status: DC
  Filled 2014-04-02: qty 0.35

## 2014-04-02 MED ORDER — DEXTROSE 50 % IV SOLN
INTRAVENOUS | Status: AC
  Administered 2014-04-02: 50 mL
  Filled 2014-04-02: qty 50

## 2014-04-02 NOTE — Progress Notes (Addendum)
Inpatient Diabetes Program Recommendations  AACE/ADA: New Consensus Statement on Inpatient Glycemic Control (2013)  Target Ranges:  Prepandial:   less than 140 mg/dL      Peak postprandial:   less than 180 mg/dL (1-2 hours)      Critically ill patients:  140 - 180 mg/dL   Reason for Assessment: Diabetes Consult  Diabetes history: DM1 Outpatient Diabetes medications: Lantus 35 units QD and Regular s/s qid Current orders for Inpatient glycemic control: Novolog sensitive Q4H  Pt in MRI at present. Per medical record, pt on Lantus 35 unit QD and Regular s/s at home.  Needs basal insulin. AG - 14. + ketones on admission.  Recommendations: Add Lantus 10 units QD. Titrate until FBS < 180 mg/dL Continue with Novolog sensitive Q4H. May need IV insulin/GlucoStabilizer initially to keep out of DKA.  Will follow daily. When diet advances, will need meal coverage insulin.  Thank you. Ailene Ards, RD, LDN, CDE Inpatient Diabetes Coordinator [phone]

## 2014-04-02 NOTE — Progress Notes (Signed)
Hypoglycemic Event  CBG: 66  Treatment: D50 IV 25 mL  Symptoms: Nervous/irritable  Follow-up CBG: [id number] CBG Result:128  Possible Reasons for Event: Unknown  Comments/MD notified:callahan    Barnett Hatter P  Remember to initiate Hypoglycemia Order Set & complete

## 2014-04-02 NOTE — Progress Notes (Signed)
Notified Md that patient was trying to leave, MD okayed for patient to go back into restraints.
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
## 2014-04-02 NOTE — Progress Notes (Signed)
Subjective:  Patient states he feels better today. He is more alert and bale to tell me he is at the hospital. He is more cooperative today.     Objective: Current vital signs: BP 105/33  Pulse 87  Temp(Src) 98.2 F (36.8 C) (Oral)  Resp 23  Ht 5' 6.93" (1.7 m)  Wt 74.6 kg (164 lb 7.4 oz)  BMI 25.81 kg/m2  SpO2 98% Vital signs in last 24 hours: Temp:  [98.2 F (36.8 C)-99.7 F (37.6 C)] 98.2 F (36.8 C) (08/03 0800) Pulse Rate:  [87] 87 (08/02 2236) Resp:  [13-25] 23 (08/03 0600) BP: (104-150)/(33-100) 105/33 mmHg (08/03 0600) SpO2:  [94 %-100 %] 98 % (08/03 0600) Weight:  [74.6 kg (164 lb 7.4 oz)-76.9 kg (169 lb 8.5 oz)] 74.6 kg (164 lb 7.4 oz) (08/03 0600)  Intake/Output from previous day: 08/02 0701 - 08/03 0700 In: 802.5 [I.V.:252.5; IV Piggyback:550] Out: 1400 [Urine:1400] Intake/Output this shift:   Nutritional status: Carb Control  Neurologic Exam: General: NAD Mental Status: Alert, oriented to month, year and believes he is at [facility].  Speech slow and slurred without evidence of aphasia.  Able to follow 3 step commands without difficulty. Cranial Nerves: II: Visual fields grossly normal, pupils 90mm equal, round, reactive to light and accommodation III,IV, VI: ptosis not present, extra-ocular motions intact bilaterally V,VII: smile symmetric, facial light touch sensation normal bilaterally VIII: hearing normal bilaterally IX,X: gag reflex present XI: bilateral shoulder shrug XII: midline tongue extension without atrophy or fasciculations  Motor: Right : Upper extremity   5/5    Left:     Upper extremity   5/5  Lower extremity   5/5     Lower extremity   5/5 Tone and bulk:normal tone throughout; no atrophy noted Sensory: Pinprick and light touch intact throughout, bilaterally Deep Tendon Reflexes:  Right: Upper Extremity   Left: Upper extremity   biceps (C-5 to C-6) 2/4   biceps (C-5 to C-6) 2/4 tricep (C7) 2/4    triceps (C7) 2/4 Brachioradialis  (C6) 2/4  Brachioradialis (C6) 2/4  Lower Extremity Lower Extremity  quadriceps (L-2 to L-4) 1/4   quadriceps (L-2 to L-4) 1/4 Achilles (S1) 0/4   Achilles (S1) 0/4  Plantars: Right: downgoing   Left: downgoing Cerebellar: normal finger-to-nose,  normal heel-to-shin test    Lab Results: Basic Metabolic Panel:  Recent Labs Lab 03/29/14 2349 03/30/14 2325 03/31/14 0612 04/01/14 0406 04/02/14 0339  NA 139  --  143 138 140  K 4.3  --  4.7 4.3 3.4*  CL 101  --  106 103 106  CO2 25  --  24 20 20   GLUCOSE 241*  --  144* 238* 246*  BUN 22  --  18 11 9   CREATININE 0.85  --  0.87 0.76 0.72  CALCIUM 9.7  --  9.7 9.2 9.4  MG  --  2.0  --   --   --     Liver Function Tests:  Recent Labs Lab 03/29/14 2349 03/31/14 0612  AST 19 17  ALT 15 14  ALKPHOS 101 98  BILITOT 0.3 0.4  PROT 7.3 7.1  ALBUMIN 3.9 3.7   No results found for this basename: LIPASE, AMYLASE,  in the last 168 hours  Recent Labs Lab 03/31/14 0612  AMMONIA 19    CBC:  Recent Labs Lab 03/30/14 2325 03/31/14 0612 04/02/14 0339  WBC 13.2* 11.4* 9.2  NEUTROABS 11.7*  --   --   HGB 14.5 12.4*  11.7*  HCT 42.4 37.3* 34.4*  MCV 91.6 92.3 88.7  PLT 396 279 307    Cardiac Enzymes:  Recent Labs Lab 03/30/14 2325 03/31/14 0612  TROPONINI <0.30 <0.30    Lipid Panel: No results found for this basename: CHOL, TRIG, HDL, CHOLHDL, VLDL, LDLCALC,  in the last 168 hours  CBG:  Recent Labs Lab 04/02/14 0206 04/02/14 0328 04/02/14 0347 04/02/14 0612 04/02/14 0810  GLUCAP 133* 66* 128* 104* 249*    Microbiology: Results for orders placed during the hospital encounter of 03/30/14  CULTURE, BLOOD (ROUTINE X 2)     Status: None   Collection Time    03/31/14 12:35 AM      Result Value Ref Range Status   Specimen Description BLOOD RIGHT ANTECUBITAL   Final   Special Requests BOTTLES DRAWN AEROBIC AND ANAEROBIC [facility] EACH   Final   Culture  Setup Time     Final   Value: 03/31/2014 03:43      Performed at [facility]   Culture     Final   Value:        BLOOD CULTURE RECEIVED NO GROWTH TO DATE CULTURE WILL BE HELD FOR 5 DAYS BEFORE ISSUING A FINAL NEGATIVE REPORT     Performed at [facility]   Report Status PENDING   Incomplete  CULTURE, BLOOD (ROUTINE X 2)     Status: None   Collection Time    03/31/14 12:36 AM      Result Value Ref Range Status   Specimen Description BLOOD LEFT HAND   Final   Special Requests BOTTLES DRAWN AEROBIC ONLY 4 CC    Final   Culture  Setup Time     Final   Value: 03/31/2014 03:43     Performed at [facility]   Culture     Final   Value:        BLOOD CULTURE RECEIVED NO GROWTH TO DATE CULTURE WILL BE HELD FOR 5 DAYS BEFORE ISSUING A FINAL NEGATIVE REPORT     Performed at [facility]   Report Status PENDING   Incomplete  CSF CULTURE     Status: None   Collection Time    03/31/14  2:52 AM      Result Value Ref Range Status   Specimen Description CSF   Final   Special Requests NONE   Final   Gram Stain     Final   Value: CYTOSPIN RARE WBC PRESENT,BOTH PMN AND MONONUCLEAR     NO ORGANISMS SEEN     Gram Stain Report Called to,Read Back By and Verified With: Gram Stain Report Called to,Read Back By and Verified With: J.OXENDINE  RN @ 0423 ON [id number] BY [facility]     Performed at [facility]   Culture     Final   Value: NO GROWTH 2 DAYS     Performed at [facility]   Report Status PENDING   Incomplete  GRAM STAIN     Status: None   Collection Time    03/31/14  2:52 AM      Result Value Ref Range Status   Specimen Description CSF   Final   Special Requests NONE   Final   Gram Stain     Final   Value: RARE WBC PRESENT,BOTH PMN AND MONONUCLEAR     NO ORGANISMS SEEN     CYTOSPIN     Gram Stain Report Called to,Read Back By and  Verified With: J.OXENDINE,RN AT 0423 ON 03/31/14 BY [facility]   Report Status 03/31/2014 FINAL   Final  MRSA PCR SCREENING     Status: None   Collection Time    03/31/14  4:59  AM      Result Value Ref Range Status   MRSA by PCR NEGATIVE  NEGATIVE Final   Comment:            The GeneXpert MRSA Assay (FDA     approved for NASAL specimens     only), is one component of a     comprehensive MRSA colonization     surveillance program. It is not     intended to diagnose MRSA     infection nor to guide or     monitor treatment for     MRSA infections.    Coagulation Studies:  Recent Labs  03/31/14 0612  LABPROT 14.0  INR 1.08    Imaging: No results found.  Medications:  Scheduled: . antiseptic oral rinse  7 mL Mouth Rinse BID  . aspirin EC  81 mg Oral Daily  . atorvastatin  40 mg Oral q1800  . cefTRIAXone (ROCEPHIN)  IV  2 g Intravenous Q12H  . chlorhexidine  15 mL Mouth Rinse BID  . dextrose  1 ampule Intravenous Once  . enoxaparin (LOVENOX) injection  40 mg Subcutaneous Q24H  . ferrous sulfate  325 mg Oral Q breakfast  . insulin aspart  0-9 Units Subcutaneous 6 times per day  . levothyroxine  125 mcg Oral QAC breakfast  . lisinopril  20 mg Oral Daily  . LORazepam  2 mg Oral BID  . metoprolol tartrate  12.5 mg Oral BID  . nicotine  21 mg Transdermal Daily  . sodium chloride  3 mL Intravenous Q12H  . vancomycin  750 mg Intravenous Q8H    Assessment/Plan:  Delirium: Patient improving.  Able to follow all commands, calm and more attentive. Today.  Able to states month, year and that he is in the hospital. HSV culture negative. Exam continues to find no focal findings other than slurred speech. MRI brain Pending.   Recommendations:  1. Continue broad spectrum antibiotics .  2. MRI of the brain under sedation. 3. EEG pending  Will continue to follow.   Felicie MornDavid Qualyn Oyervides PA-C [facility] [phone]  04/02/2014, 9:25 AM

## 2014-04-02 NOTE — Progress Notes (Signed)
Patient irritable, states he is going to just walk out if the doctor does not discharge him soon.  Ativan IV given.

## 2014-04-02 NOTE — Progress Notes (Signed)
Spoke with patients sister who stated that patients speech being slurred is a new finding this admission, that this has not happened in the past.

## 2014-04-02 NOTE — Progress Notes (Signed)
Spoke with the patient's sister and mother, and both feel he is a danger to himself secondary to lack of self-care. They often find him unresponsive in his apartment with dangerously low blood glucoses. He has been depressed and angry. Patient agitated, threatening to leave AMA. Psychiatric evaluation requested. Commitment papers initiated until psychiatric evaluation can be performed.  Paislee Szatkowski  04/02/2014 5:50 PM

## 2014-04-02 NOTE — Progress Notes (Signed)
Nutrition Brief Note  Patient identified for having false Malnutrition Screening Tool (MST) score and having low braden   Wt Readings from Last 5 Encounters:  04/02/14 164 lb 7.4 oz (74.6 kg)  02/14/14 170 lb (77.111 kg)  02/13/14 171 lb (77.565 kg)  02/13/14 171 lb (77.565 kg)  01/31/14 171 lb (77.565 kg)     Body mass index is 25.81 kg/(m^2). Patient meets criteria for overweight based on current BMI.   Current diet order is CHO medium, patient is consuming approximately 100% of meals at this time. Labs and medications reviewed. Potassium slightly low, getting IV replacement. Per MD notes, pt with inappropriate affect today. RN reports pt eating excellent. PT in room working with pt. Weight trend shows some weight loss, however not significant.   No nutrition interventions warranted at this time. If nutrition issues arise, please consult RD.   Charlott Rakes MS, RD, LDN [phone] Pager [phone] Weekend/After Hours Pager

## 2014-04-02 NOTE — Progress Notes (Signed)
CSW received phone call from RN stating that attending MD requesting Involuntary Commitment paperwork (IVC) paperwork.   Per RN and MD, pt attempting to leave AMA and MD did not feel that it is a safe discharge. Psych was consulted this afternoon for capacity.    Involuntary Commitment paperwork (IVC) faxed to Magistrate. Called and confirmed Magistrate received paperwork and they will review and determine if pt meets criteria for IVC.   Loletta Specter, MSW, LCSW Clinical Social Work [phone]

## 2014-04-02 NOTE — Progress Notes (Signed)
MD notified that patient has had several watery stools today, order obtained to r/o c diff.
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
## 2014-04-03 DIAGNOSIS — F411 Generalized anxiety disorder: Secondary | ICD-10-CM

## 2014-04-03 DIAGNOSIS — R4182 Altered mental status, unspecified: Secondary | ICD-10-CM

## 2014-04-03 DIAGNOSIS — J9620 Acute and chronic respiratory failure, unspecified whether with hypoxia or hypercapnia: Secondary | ICD-10-CM

## 2014-04-03 DIAGNOSIS — F332 Major depressive disorder, recurrent severe without psychotic features: Secondary | ICD-10-CM

## 2014-04-03 LAB — CLOSTRIDIUM DIFFICILE BY PCR: Toxigenic C. Difficile by PCR: NEGATIVE

## 2014-04-03 LAB — CSF CULTURE: Culture: NO GROWTH

## 2014-04-03 LAB — GLUCOSE, CAPILLARY
GLUCOSE-CAPILLARY: 172 mg/dL — AB (ref 70–99)
GLUCOSE-CAPILLARY: 178 mg/dL — AB (ref 70–99)
GLUCOSE-CAPILLARY: 40 mg/dL — AB (ref 70–99)
GLUCOSE-CAPILLARY: 82 mg/dL (ref 70–99)
GLUCOSE-CAPILLARY: 97 mg/dL (ref 70–99)
Glucose-Capillary: 91 mg/dL (ref 70–99)
Glucose-Capillary: 119 mg/dL — ABNORMAL HIGH (ref 70–99)
Glucose-Capillary: 148 mg/dL — ABNORMAL HIGH (ref 70–99)
Glucose-Capillary: 193 mg/dL — ABNORMAL HIGH (ref 70–99)
Glucose-Capillary: 227 mg/dL — ABNORMAL HIGH (ref 70–99)
Glucose-Capillary: 66 mg/dL — ABNORMAL LOW (ref 70–99)
Glucose-Capillary: 69 mg/dL — ABNORMAL LOW (ref 70–99)

## 2014-04-03 MED ORDER — CLONAZEPAM 1 MG PO TABS
1.0000 mg | ORAL_TABLET | Freq: Two times a day (BID) | ORAL | Status: DC
  Administered 2014-04-03 – 2014-04-06 (×7): 1 mg via ORAL
  Filled 2014-04-03 (×7): qty 1

## 2014-04-03 MED ORDER — ARIPIPRAZOLE 2 MG PO TABS
2.0000 mg | ORAL_TABLET | Freq: Two times a day (BID) | ORAL | Status: DC
  Administered 2014-04-03 – 2014-04-04 (×2): 2 mg via ORAL
  Filled 2014-04-03 (×4): qty 1

## 2014-04-03 MED ORDER — DULOXETINE HCL 30 MG PO CPEP
30.0000 mg | ORAL_CAPSULE | Freq: Every day | ORAL | Status: DC
  Administered 2014-04-03 – 2014-04-05 (×3): 30 mg via ORAL
  Filled 2014-04-03 (×3): qty 1
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
## 2014-04-03 NOTE — Progress Notes (Signed)
CRITICAL VALUE ALERT  Critical value received:  CBG 66  Date of notification:  04/03/2014  Time of notification:  2000  Critical value read back:Yes.    Nurse who received alert:  Mardene Celeste I  MD notified (1st page):  n/a  Time of first page:  n/a  Pt given 1 orange juice. Will recheck CBG in 15 minutes
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
## 2014-04-03 NOTE — Progress Notes (Signed)
CARE MANAGEMENT NOTE 04/03/2014  Patient:  ARTIST, ZLOTNICK   Account Number:  [id number]  Date Initiated:  04/03/2014  Documentation initiated by:  DAVIS,RHONDA  Subjective/Objective Assessment:   type 1 diabetes mellitus and has been having difficulty with hypoglycemic episodes at home. significantly confused, agitated, with blood sugar 255. There was no fall no trauma reported. given Ativan and required 4point restraint for his agi     Action/Plan:   pt ivc's and awaiting pssych eval on [id number]   Anticipated DC Date:  04/06/2014   Anticipated [facility] Plan[facility]  In-house referral  Clinical Social Worker      DC Planning Services  NA      [facility] Choice  NA   Choice offered to / List presented to:  NA   DME arranged  NA      DME agency  NA     HH arranged  NA      HH agency  NA   Status of service:  In process, will continue to follow Medicare Important Message given?  NA - LOS <3 / Initial given by admissions (If response is "NO", the following Medicare IM given date fields will be blank) Date Medicare IM given:   Medicare IM given by:   Date Additional Medicare IM given:   Additional Medicare IM given by:    Discharge Disposition:    Per UR Regulation:  Reviewed for med. necessity/level of care/duration of stay  If discussed at Long Length of Stay Meetings, dates discussed:    Comments:  [id number]/Rhonda Stark Jock, BSN, [place] [phone] Chart Reviewed for discharge and hospital needs. Discharge needs at time of review: None present will follow for needs. Review of patient progress due on [id number]

## 2014-04-03 NOTE — Progress Notes (Signed)
Patient to be transferred to room 1343 which is a Med/Surg room.  Report received from Samara Deist, [place] in ICU.  I had some concerns with patient not being on telemetry based on his heart rhythm patterns in the ICU.  Dr. Elisabeth Pigeon notified and she was agreeable to send patient to telemetry.  Also, she stated patient's CBG's can be changed to q4h.  Allayne Butcher [facility]  04/03/2014  1:17 PM
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
## 2014-04-03 NOTE — Progress Notes (Addendum)
Pt became increasingly agitated, combative, and verbally abusive. Waist belt was not keeping patient in bed and pt repeatedly attempted to get out of bed. Pt reoriented to safety precautions. Pt continued to move out of bed. Soft wrist restraints placed. Restraint precautions maintained and Q2H checks performed as per protocol.   Pt transferred to 3W. Full report given to [facility]. VSS on transport.   Pt not transferred to 3W since RN Kaiven was not comforable with pt being on non-telemetry floor. Pt transferred to 1417 and full report given to Alli.

## 2014-04-04 DIAGNOSIS — Z7189 Other specified counseling: Secondary | ICD-10-CM

## 2014-04-04 DIAGNOSIS — Z62820 Parent-biological child conflict: Secondary | ICD-10-CM

## 2014-04-04 LAB — BASIC METABOLIC PANEL
Anion gap: 13 (ref 5–15)
BUN: 5 mg/dL — ABNORMAL LOW (ref 6–23)
CALCIUM: 9.1 mg/dL (ref 8.4–10.5)
CO2: 20 meq/L (ref 19–32)
CREATININE: 0.7 mg/dL (ref 0.50–1.35)
Chloride: 110 mEq/L (ref 96–112)
GFR calc Af Amer: >90 mL/min (ref >90)
GFR calc non Af Amer: >90 mL/min (ref >90)
Glucose, Bld: 147 mg/dL — ABNORMAL HIGH (ref 70–99)
Potassium: 4 mEq/L (ref 3.7–5.3)
SODIUM: 143 meq/L (ref 137–147)

## 2014-04-04 LAB — GLUCOSE, CAPILLARY
GLUCOSE-CAPILLARY: 186 mg/dL — AB (ref 70–99)
GLUCOSE-CAPILLARY: 209 mg/dL — AB (ref 70–99)
Glucose-Capillary: 111 mg/dL — ABNORMAL HIGH (ref 70–99)
Glucose-Capillary: 121 mg/dL — ABNORMAL HIGH (ref 70–99)
Glucose-Capillary: 147 mg/dL — ABNORMAL HIGH (ref 70–99)
Glucose-Capillary: 309 mg/dL — ABNORMAL HIGH (ref 70–99)
Glucose-Capillary: 95 mg/dL (ref 70–99)

## 2014-04-04 MED ORDER — SODIUM CHLORIDE 0.9 % IV SOLN
INTRAVENOUS | Status: DC
  Administered 2014-04-04 – 2014-04-06 (×3): via INTRAVENOUS

## 2014-04-04 MED ORDER — INSULIN GLARGINE 100 UNIT/ML ~~LOC~~ SOLN: 10.0000 [IU] | Freq: Every day | SUBCUTANEOUS | Status: DC

## 2014-04-04 MED ORDER — ARIPIPRAZOLE 5 MG PO TABS
5.0000 mg | ORAL_TABLET | Freq: Two times a day (BID) | ORAL | Status: DC
  Administered 2014-04-04 – 2014-04-06 (×4): 5 mg via ORAL
  Filled 2014-04-04 (×6): qty 1

## 2014-04-04 MED ORDER — INSULIN GLARGINE 100 UNIT/ML ~~LOC~~ SOLN
10.0000 [IU] | Freq: Every day | SUBCUTANEOUS | Status: DC
  Administered 2014-04-04 – 2014-04-05 (×3): 10 [IU] via SUBCUTANEOUS
  Filled 2014-04-04 (×4): qty 0.1
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
## 2014-04-04 NOTE — Progress Notes (Signed)
Wasted 1mg  of ativan with Warden Fillers, RN
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
## 2014-04-04 NOTE — Progress Notes (Signed)
Pt refusing telemetry monitor at this time. Will continue to monitor pt closely. Reginald Gutierrez

## 2014-04-05 LAB — GLUCOSE, CAPILLARY
GLUCOSE-CAPILLARY: 123 mg/dL — AB (ref 70–99)
GLUCOSE-CAPILLARY: 162 mg/dL — AB (ref 70–99)
GLUCOSE-CAPILLARY: 34 mg/dL — AB (ref 70–99)
Glucose-Capillary: 36 mg/dL — CL (ref 70–99)
Glucose-Capillary: 130 mg/dL — ABNORMAL HIGH (ref 70–99)
Glucose-Capillary: 142 mg/dL — ABNORMAL HIGH (ref 70–99)
Glucose-Capillary: 217 mg/dL — ABNORMAL HIGH (ref 70–99)
Glucose-Capillary: 184 mg/dL — ABNORMAL HIGH (ref 70–99)

## 2014-04-05 MED ORDER — DULOXETINE HCL 20 MG PO CPEP
40.0000 mg | ORAL_CAPSULE | Freq: Every day | ORAL | Status: DC
  Administered 2014-04-06: 40 mg via ORAL
  Filled 2014-04-05: qty 2

## 2014-04-05 MED ORDER — DEXTROSE 50 % IV SOLN
50.0000 mL | Freq: Once | INTRAVENOUS | Status: AC | PRN
  Administered 2014-04-05: 50 mL via INTRAVENOUS
  Filled 2014-04-05: qty 50

## 2014-04-05 NOTE — Progress Notes (Signed)
Clinical Social Work  CSW met with patient and mother at bedside. Patient agreeable for mom to be involved in session. Patient drowsy and falls asleep off and on throughout assessment. Patient reports he was recently receiving counseling services through [facility] and agreeable that it would be beneficial to continue to follow up with services for therapy. CSW also provided a list of outpatient psychiatrists which patient was agreeable to schedule an outpatient appointment.   Patient minimally engaged but accepted information. Mom agreeable to follow up to assist with outpatient follow up. Patient continues to refuse SNF services and wants to return home.  CSW will continue to follow.  [place], [place] [phone]

## 2014-04-05 NOTE — Progress Notes (Signed)
PT Cancellation Note  Patient Details Name: Reginald Gutierrez MRN: [id number] DOB: Dec 06, 1965   Cancelled Treatment:    Reason Eval/Treat Not Completed: Patient declined, no reason specified (earler patent with MD and family, checked again and pt. declined. stated he wakled yesterday.)  Will f/u 04/06/14   Rada Hay 04/05/2014, 3:26 PM

## 2014-04-05 NOTE — Progress Notes (Signed)
Clinical Social Work  Late Entry  CSW met with patient at bedside and psych MD at bedside. Patient cooperative and spoke with team re: his desires to DC back home and refusing placement at SNF. Patient does admit that he could benefit from psychiatric follow up in the community and agreeable for CSW to find resources.  CSW received a phone call from sister re: DC plans. Sister reports she and mother had an appointment re: guardianship today but was informed that process usually takes 30 days to complete. Sister reports she remains concerned about patient DC home but understands that psych MD feels that patient has capacity to make his own decisions.  CSW will continue to follow.  [place], [place] [phone]

## 2014-04-05 NOTE — Progress Notes (Signed)
Hypoglycemic Event  CBG:35  Treatment: 15 GM carbohydrate snack  Symptoms: None  Follow-up CBG: Time:0416 CBG Result:34 pt given 25 ml D50  Possible Reasons for Event: unknown  Comments/MD notified: f/u cbg 130 after D50     Reginald Gutierrez  Remember to initiate Hypoglycemia Order Set & complete
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
## 2014-04-06 DIAGNOSIS — N179 Acute kidney failure, unspecified: Secondary | ICD-10-CM

## 2014-04-06 DIAGNOSIS — E1029 Type 1 diabetes mellitus with other diabetic kidney complication: Secondary | ICD-10-CM

## 2014-04-06 LAB — CULTURE, BLOOD (ROUTINE X 2)
Culture: NO GROWTH
Culture: NO GROWTH

## 2014-04-06 LAB — GLUCOSE, CAPILLARY
Glucose-Capillary: 53 mg/dL — ABNORMAL LOW (ref 70–99)
Glucose-Capillary: 94 mg/dL (ref 70–99)
Glucose-Capillary: 151 mg/dL — ABNORMAL HIGH (ref 70–99)

## 2014-04-06 MED ORDER — INSULIN GLARGINE 100 UNIT/ML SOLOSTAR PEN: 10.0000 [IU] | PEN_INJECTOR | Freq: Every day | SUBCUTANEOUS | Status: DC

## 2014-04-06 MED ORDER — OXYCODONE HCL 20 MG PO TABS: 20.0000 mg | ORAL_TABLET | Freq: Four times a day (QID) | ORAL | Status: DC | PRN

## 2014-04-06 MED ORDER — INSULIN REGULAR HUMAN 100 UNIT/ML IJ SOLN: 0.0000 [IU] | Freq: Four times a day (QID) | INTRAMUSCULAR | Status: DC | PRN

## 2014-04-06 MED ORDER — DULOXETINE HCL 40 MG PO CPEP: 40.0000 mg | ORAL_CAPSULE | Freq: Every day | ORAL | Status: AC

## 2014-04-06 MED ORDER — ARIPIPRAZOLE 5 MG PO TABS
5.0000 mg | ORAL_TABLET | Freq: Two times a day (BID) | ORAL | Status: DC
Start: 2014-04-06 — End: 2014-07-09

## 2014-04-06 NOTE — Progress Notes (Signed)
PT Cancellation Note  Patient Details Name: Reginald Gutierrez MRN: [id number] DOB: [birth year]   Cancelled Treatment:    Reason Eval/Treat Not Completed: Other (comment) (RN reports pt up ambulating well and to d/c home today.)   Adriell Polansky,KATHrine E 04/06/2014, 9:26 AM
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
## 2014-04-06 NOTE — Care Management Note (Signed)
    Page 1 of 2   04/06/2014     1:08:46 PM CARE MANAGEMENT NOTE 04/06/2014  Patient:  AUSTUN, DEBAERE   Account Number:  [id number]  Date Initiated:  04/03/2014  Documentation initiated by:  DAVIS,RHONDA  Subjective/Objective Assessment:   type 1 diabetes mellitus and has been having difficulty with hypoglycemic episodes at home. significantly confused, agitated, with blood sugar 255. There was no fall no trauma reported. given Ativan and required 4point restraint for his agi     Action/Plan:   pt ivc's and awaiting pssych eval on [id number]   Anticipated DC Date:  04/06/2014   Anticipated DC Plan:  HOME/SELF CARE  In-house referral  Clinical Social Worker      DC Planning Services  CM consult      [facility] Choice  NA   Choice offered to / List presented to:  NA   DME arranged  NA      DME agency  NA     HH arranged  NA      HH agency  NA   Status of service:  Completed, signed off Medicare Important Message given?  NA - LOS <3 / Initial given by admissions (If response is "NO", the following Medicare IM given date fields will be blank) Date Medicare IM given:  04/06/2014 Medicare IM given by:  [facility] Date Additional Medicare IM given:   Additional Medicare IM given by:    Discharge Disposition:  HOME/SELF CARE  Per UR Regulation:  Reviewed for med. necessity/level of care/duration of stay  If discussed at Long Length of Stay Meetings, dates discussed:   04/05/2014    Comments:  04/06/14 Meredith Mells RN,BSN NCM [phone] PSYCH-ABLE TO MAKE OWN DECISIONS, & LIVING ARRANGEMENTS,OTPT RESOURCES.D/C HOME NO NEEDS OR ORDERS.  [id number] Earlene Plater, RN, BSN, [place] [phone] Chart Reviewed for discharge and hospital needs. Discharge needs at time of review: None present will follow for needs. Review of patient progress due on [id number]

## 2014-04-06 NOTE — Discharge Instructions (Signed)
Diabetes and Standards of Medical Care Diabetes is complicated. You may find that your diabetes team includes a dietitian, nurse, diabetes educator, eye doctor, and more. To help everyone know what is going on and to help you get the care you deserve, the following schedule of care was developed to help keep you on track. Below are the tests, exams, vaccines, medicines, education, and plans you will need. HbA1c test This test shows how well you have controlled your glucose over the past 2-3 months. It is used to see if your diabetes management plan needs to be adjusted.   It is performed at least 2 times a year if you are meeting treatment goals.  It is performed 4 times a year if therapy has changed or if you are not meeting treatment goals. Blood pressure test  This test is performed at every routine medical visit. The goal is less than 140/90 mm Hg for most people, but 130/80 mm Hg in some cases. Ask your health care provider about your goal. Dental exam  Follow up with the dentist regularly. Eye exam  If you are diagnosed with type 1 diabetes as a child, get an exam upon reaching the age of 10 years or older and have had diabetes for 3-5 years. Yearly eye exams are recommended after that initial eye exam.  If you are diagnosed with type 1 diabetes as an adult, get an exam within 5 years of diagnosis and then yearly.  If you are diagnosed with type 2 diabetes, get an exam as soon as possible after the diagnosis and then yearly. Foot care exam  Visual foot exams are performed at every routine medical visit. The exams check for cuts, injuries, or other problems with the feet.  A comprehensive foot exam should be done yearly. This includes visual inspection as well as assessing foot pulses and testing for loss of sensation.  Check your feet nightly for cuts, injuries, or other problems with your feet. Tell your health care provider if anything is not healing. Kidney function test (urine  microalbumin)  This test is performed once a year.  Type 1 diabetes: The first test is performed 5 years after diagnosis.  Type 2 diabetes: The first test is performed at the time of diagnosis.  A serum creatinine and estimated glomerular filtration rate (eGFR) test is done once a year to assess the level of chronic kidney disease (CKD), if present. Lipid profile (cholesterol, HDL, LDL, triglycerides)  Performed every 5 years for most people.  The goal for LDL is less than 100 mg/dL. If you are at high risk, the goal is less than 70 mg/dL.  The goal for HDL is 40 mg/dL-50 mg/dL for men and 50 mg/dL-60 mg/dL for women. An HDL cholesterol of 60 mg/dL or higher gives some protection against heart disease.  The goal for triglycerides is less than 150 mg/dL. Influenza vaccine, pneumococcal vaccine, and hepatitis B vaccine  The influenza vaccine is recommended yearly.  It is recommended that people with diabetes who are over 65 years old get the pneumonia vaccine. In some cases, two separate shots may be given. Ask your health care provider if your pneumonia vaccination is up to date.  The hepatitis B vaccine is also recommended for adults with diabetes. Diabetes self-management education  Education is recommended at diagnosis and ongoing as needed. Treatment plan  Your treatment plan is reviewed at every medical visit. Document Released: 06/14/2009 Document Revised: 01/01/2014 Document Reviewed: 01/17/2013 [employer] Patient Information 2015 [employer]   [employer]. This information is not intended to replace advice given to you by your health care provider. Make sure you discuss any questions you have with your health care provider. Blood Glucose Monitoring Monitoring your blood glucose (also know as blood sugar) helps you to manage your diabetes. It also helps you and your health care provider monitor your diabetes and determine how well your treatment plan is working. WHY SHOULD YOU MONITOR  YOUR BLOOD GLUCOSE?  It can help you understand how food, exercise, and medicine affect your blood glucose.  It allows you to know what your blood glucose is at any given moment. You can quickly tell if you are having low blood glucose (hypoglycemia) or high blood glucose (hyperglycemia).  It can help you and your health care provider know how to adjust your medicines.  It can help you understand how to manage an illness or adjust medicine for exercise. WHEN SHOULD YOU TEST? Your health care provider will help you decide how often you should check your blood glucose. This may depend on the type of diabetes you have, your diabetes control, or the types of medicines you are taking. Be sure to write down all of your blood glucose readings so that this information can be reviewed with your health care provider. See below for examples of testing times that your health care provider may suggest. Type 1 Diabetes  Test 4 times a day if you are in good control, using an insulin pump, or perform multiple daily injections.  If your diabetes is not well controlled or if you are sick, you may need to monitor more often.  It is a good idea to also monitor:  Before and after exercise.  Between meals and 2 hours after a meal.  Occasionally between 2:00 a.m. and 3:00 a.m. Type 2 Diabetes  It can vary with each person, but generally, if you are on insulin, test 4 times a day.  If you take medicines by mouth (orally), test 2 times a day.  If you are on a controlled diet, test once a day.  If your diabetes is not well controlled or if you are sick, you may need to monitor more often. HOW TO MONITOR YOUR BLOOD GLUCOSE Supplies Needed  Blood glucose meter.  Test strips for your meter. Each meter has its own strips. You must use the strips that go with your own meter.  A pricking needle (lancet).  A device that holds the lancet (lancing device).  A journal or log book to write down your  results. Procedure  Wash your hands with soap and water. Alcohol is not preferred.  Prick the side of your finger (not the tip) with the lancet.  Gently milk the finger until a small drop of blood appears.  Follow the instructions that come with your meter for inserting the test strip, applying blood to the strip, and using your blood glucose meter. Other Areas to Get Blood for Testing Some meters allow you to use other areas of your body (other than your finger) to test your blood. These areas are called alternative sites. The most common alternative sites are:  The forearm.  The thigh.  The back area of the lower leg.  The palm of the hand. The blood flow in these areas is slower. Therefore, the blood glucose values you get may be delayed, and the numbers are different from what you would get from your fingers. Do not use alternative sites if you think you are having  hypoglycemia. Your reading will not be accurate. Always use a finger if you are having hypoglycemia. Also, if you cannot feel your lows (hypoglycemia unawareness), always use your fingers for your blood glucose checks. ADDITIONAL TIPS FOR GLUCOSE MONITORING  Do not reuse lancets.  Always carry your supplies with you.  All blood glucose meters have a 24-hour "hotline" number to call if you have questions or need help.  Adjust (calibrate) your blood glucose meter with a control solution after finishing a few boxes of strips. BLOOD GLUCOSE RECORD KEEPING It is a good idea to keep a daily record or log of your blood glucose readings. Most glucose meters, if not all, keep your glucose records stored in the meter. Some meters come with the ability to download your records to your home computer. Keeping a record of your blood glucose readings is especially helpful if you are wanting to look for patterns. Make notes to go along with the blood glucose readings because you might forget what happened at that exact time. Keeping  good records helps you and your health care provider to work together to achieve good diabetes management.  Document Released: 08/20/2003 Document Revised: 01/01/2014 Document Reviewed: 01/09/2013 [facility] Patient Information 2015 [place], [place]. This information is not intended to replace advice given to you by your health care provider. Make sure you discuss any questions you have with your health care provider. Hypoglycemia Hypoglycemia occurs when the glucose in your blood is too low. Glucose is a type of sugar that is your body's main energy source. Hormones, such as insulin and glucagon, control the level of glucose in the blood. Insulin lowers blood glucose and glucagon increases blood glucose. Having too much insulin in your blood stream, or not eating enough food containing sugar, can result in hypoglycemia. Hypoglycemia can happen to people with or without diabetes. It can develop quickly and can be a medical emergency.  CAUSES   Missing or delaying meals.  Not eating enough carbohydrates at meals.  Taking too much diabetes medicine.  Not timing your oral diabetes medicine or insulin doses with meals, snacks, and exercise.  Nausea and vomiting.  Certain medicines.  Severe illnesses, such as hepatitis, kidney disorders, and certain eating disorders.  Increased activity or exercise without eating something extra or adjusting medicines.  Drinking too much alcohol.  A nerve disorder that affects body functions like your heart rate, blood pressure, and digestion (autonomic neuropathy).  A condition where the stomach muscles do not function properly (gastroparesis). Therefore, medicines and food may not absorb properly.  Rarely, a tumor of the pancreas can produce too much insulin. SYMPTOMS   Hunger.  Sweating (diaphoresis).  Change in body temperature.  Shakiness.  Headache.  Anxiety.  Lightheadedness.  Irritability.  Difficulty concentrating.  Dry  mouth.  Tingling or numbness in the hands or feet.  Restless sleep or sleep disturbances.  Altered speech and coordination.  Change in mental status.  Seizures or prolonged convulsions.  Combativeness.  Drowsiness (lethargic).  Weakness.  Increased heart rate or palpitations.  Confusion.  Pale, gray skin color.  Blurred or double vision.  Fainting. DIAGNOSIS  A physical exam and medical history will be performed. Your caregiver may make a diagnosis based on your symptoms. Blood tests and other lab tests may be performed to confirm a diagnosis. Once the diagnosis is made, your caregiver will see if your signs and symptoms go away once your blood glucose is raised.  TREATMENT  Usually, you can easily treat your hypoglycemia  when you notice symptoms.  Check your blood glucose. If it is less than 70 mg/dl, take one of the following:   3-4 glucose tablets.    cup juice.    cup regular soda.   1 cup skim milk.   -1 tube of glucose gel.   5-6 hard candies.   Avoid high-fat drinks or food that may delay a rise in blood glucose levels.  Do not take more than the recommended amount of sugary foods, drinks, gel, or tablets. Doing so will cause your blood glucose to go too high.   Wait 10-15 minutes and recheck your blood glucose. If it is still less than 70 mg/dl or below your target range, repeat treatment.   Eat a snack if it is more than 1 hour until your next meal.  There may be a time when your blood glucose may go so low that you are unable to treat yourself at home when you start to notice symptoms. You may need someone to help you. You may even faint or be unable to swallow. If you cannot treat yourself, someone will need to bring you to the hospital.  [place]  If you have diabetes, follow your diabetes management plan by:  Taking your medicines as directed.  Following your exercise plan.  Following your meal plan. Do not skip  meals. Eat on time.  Testing your blood glucose regularly. Check your blood glucose before and after exercise. If you exercise longer or different than usual, be sure to check blood glucose more frequently.  Wearing your medical alert jewelry that says you have diabetes.  Identify the cause of your hypoglycemia. Then, develop ways to prevent the recurrence of hypoglycemia.  Do not take a hot bath or shower right after an insulin shot.  Always carry treatment with you. Glucose tablets are the easiest to carry.  If you are going to drink alcohol, drink it only with meals.  Tell friends or family members ways to keep you safe during a seizure. This may include removing hard or sharp objects from the area or turning you on your side.  Maintain a healthy weight. SEEK MEDICAL CARE IF:   You are having problems keeping your blood glucose in your target range.  You are having frequent episodes of hypoglycemia.  You feel you might be having side effects from your medicines.  You are not sure why your blood glucose is dropping so low.  You notice a change in vision or a new problem with your vision. SEEK IMMEDIATE MEDICAL CARE IF:   Confusion develops.  A change in mental status occurs.  The inability to swallow develops.  Fainting occurs. Document Released: 08/17/2005 Document Revised: 08/22/2013 Document Reviewed: 12/14/2011 [facility] Patient Information 2015 [place], [place]. This information is not intended to replace advice given to you by your health care provider. Make sure you discuss any questions you have with your health care provider.

## 2014-04-12 ENCOUNTER — Telehealth: Payer: Self-pay | Admitting: Internal Medicine

## 2014-04-17 ENCOUNTER — Encounter: Payer: Self-pay | Admitting: Internal Medicine

## 2014-04-17 ENCOUNTER — Ambulatory Visit: Payer: Medicare Other | Attending: Internal Medicine | Admitting: Internal Medicine

## 2014-04-17 VITALS — BP 101/56 | HR 88 | Temp 98.4°F | Resp 16 | Wt 156.2 lb

## 2014-04-17 DIAGNOSIS — F1920 Other psychoactive substance dependence, uncomplicated: Secondary | ICD-10-CM | POA: Diagnosis not present

## 2014-04-17 DIAGNOSIS — G894 Chronic pain syndrome: Secondary | ICD-10-CM | POA: Insufficient documentation

## 2014-04-17 DIAGNOSIS — I252 Old myocardial infarction: Secondary | ICD-10-CM | POA: Insufficient documentation

## 2014-04-17 DIAGNOSIS — E109 Type 1 diabetes mellitus without complications: Secondary | ICD-10-CM | POA: Diagnosis not present

## 2014-04-17 DIAGNOSIS — F17200 Nicotine dependence, unspecified, uncomplicated: Secondary | ICD-10-CM | POA: Insufficient documentation

## 2014-04-17 DIAGNOSIS — E785 Hyperlipidemia, unspecified: Secondary | ICD-10-CM | POA: Diagnosis not present

## 2014-04-17 DIAGNOSIS — F3289 Other specified depressive episodes: Secondary | ICD-10-CM | POA: Diagnosis not present

## 2014-04-17 DIAGNOSIS — I509 Heart failure, unspecified: Secondary | ICD-10-CM | POA: Diagnosis not present

## 2014-04-17 DIAGNOSIS — I10 Essential (primary) hypertension: Secondary | ICD-10-CM | POA: Insufficient documentation

## 2014-04-17 DIAGNOSIS — IMO0002 Reserved for concepts with insufficient information to code with codable children: Secondary | ICD-10-CM

## 2014-04-17 DIAGNOSIS — Z794 Long term (current) use of insulin: Secondary | ICD-10-CM | POA: Insufficient documentation

## 2014-04-17 DIAGNOSIS — E1065 Type 1 diabetes mellitus with hyperglycemia: Secondary | ICD-10-CM

## 2014-04-17 DIAGNOSIS — E039 Hypothyroidism, unspecified: Secondary | ICD-10-CM | POA: Diagnosis not present

## 2014-04-17 DIAGNOSIS — F411 Generalized anxiety disorder: Secondary | ICD-10-CM | POA: Diagnosis not present

## 2014-04-17 DIAGNOSIS — F329 Major depressive disorder, single episode, unspecified: Secondary | ICD-10-CM | POA: Diagnosis not present

## 2014-04-17 DIAGNOSIS — I2510 Atherosclerotic heart disease of native coronary artery without angina pectoris: Secondary | ICD-10-CM

## 2014-04-17 DIAGNOSIS — Z7982 Long term (current) use of aspirin: Secondary | ICD-10-CM | POA: Insufficient documentation

## 2014-04-17 DIAGNOSIS — E108 Type 1 diabetes mellitus with unspecified complications: Secondary | ICD-10-CM

## 2014-04-17 NOTE — Patient Instructions (Signed)
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
## 2014-04-17 NOTE — Progress Notes (Signed)
Patient ID: Reginald Gutierrez, male   DOB: Dec 30, 1965, 48 y.o.   MRN: [id number]  CC: follow up  HPI: Pt with DM type I comes in for follow up after recent hospitalization. Doing well. Kept very good log of numbers.   No Known Allergies Past Medical History  Diagnosis Date  . Diabetes mellitus type 1   . Hyperlipidemia   . Depression   . Umbilical hernia   . Hypertension   . Glaucoma   . CHF (congestive heart failure)   . Heart attack 12/16/11  . Hypothyroidism   . Hypoglycemia, unspecified   . Disorder of bone and cartilage, unspecified   . Acute posthemorrhagic anemia   . Anxiety state, unspecified   . Chronic pain syndrome   . Acute respiratory failure   . Dysphagia, oral phase   . Stress fracture of tibia or fibula   . Syncope and collapse   . Coronary artery disease 07/19/2012  . NSTEMI (non-ST elevated myocardial infarction) 12/19/2011  . Ischemic cardiomyopathy   . Narcotic dependency, continuous    Current Outpatient Prescriptions on File Prior to Visit  Medication Sig Dispense Refill  . ACCU-CHEK AVIVA PLUS test strip TEST 6 TIMES A DAY AS DIRECTED.  150 each  12  . ARIPiprazole (ABILIFY) 5 MG tablet Take 1 tablet (5 mg total) by mouth 2 (two) times daily.  60 tablet  0  . aspirin EC 81 MG tablet Take 81 mg by mouth daily.      Marland Kitchen atorvastatin (LIPITOR) 40 MG tablet Take 40 mg by mouth daily.      . B-D INS SYRINGE 0.5CC/30GX1/2" 30G X 1/2" 0.5 ML MISC use as directed  100 each  0  . DULoxetine 40 MG CPEP Take 40 mg by mouth daily.  30 capsule  0  . ferrous sulfate 325 (65 FE) MG tablet Take 325 mg by mouth daily with breakfast.      . glucagon (GLUCAGON EMERGENCY) 1 MG injection Inject 1 mg into the muscle once as needed (for low blood sugar).  1 each  2  . glucose blood test strip TEST BLOOD SUGAR 6 TIMES A DAY AS INSTRUCTED BY DR ELLISON  200 each  10  . Insulin Glargine (LANTUS SOLOSTAR) 100 UNIT/ML Solostar Pen Inject 10 Units into the skin daily at 10 pm.  15 mL  1   . insulin regular (NOVOLIN R,HUMULIN R) 100 units/mL injection Inject 0-0.05 mLs (0-5 Units total) into the skin 4 (four) times daily as needed for high blood sugar (cbg >200). CBG 201 - 250: 3 units        CBG 251 - 300: 5 units        CBG 301 - 350: 7 units        CBG 351 - 400 9 units  10 mL  0  . INSULIN SYRINGE .5CC/29G 29G X 1/2" 0.5 ML MISC 1 Syringe by Does not apply route daily.  30 each  11  . levothyroxine (SYNTHROID, LEVOTHROID) 125 MCG tablet Take 1 tablet (125 mcg total) by mouth daily before breakfast.  30 tablet  11  . lisinopril (PRINIVIL,ZESTRIL) 40 MG tablet Take 0.5 tablets (20 mg total) by mouth daily.  15 tablet  4  . LORazepam (ATIVAN) 2 MG tablet Take 1 tablet (2 mg) twice daily, may take an additional tablet during the day for anxiety if needed  60 tablet  0  . metoprolol tartrate (LOPRESSOR) 25 MG tablet Take 0.5 tablets (12.5  mg total) by mouth 2 (two) times daily.  30 tablet  3  . Multiple Vitamin (MULTIVITAMIN WITH MINERALS) TABS tablet Take 1 tablet by mouth daily. Diabetes daily pak from [employer]      . Oxycodone HCl 20 MG TABS Take 1 tablet (20 mg total) by mouth 4 (four) times daily as needed.  10 tablet  0   No current facility-administered medications on file prior to visit.   Family History  Problem Relation Age of Onset  . Heart disease Father    History   Social History  . Marital Status: Single    Spouse Name: N/A    Number of Children: 0  . Years of Education: 16   Occupational History  . Sales- disabled    Social History Main Topics  . Smoking status: Current Some Day Smoker -- 1.00 packs/day    Types: Cigarettes  . Smokeless tobacco: Never Used  . Alcohol Use: No  . Drug Use: No     Comment: Past Cocaine abuse-2002-2003  . Sexual Activity: No   Other Topics Concern  . Not on file   Social History Narrative  . No narrative on file    Review of Systems  Constitutional: Negative for fever, chills, diaphoresis, activity change,  appetite change and fatigue.  HENT: Negative for ear pain, nosebleeds, congestion, facial swelling, rhinorrhea, neck pain, neck stiffness and ear discharge.   Eyes: Negative for pain, discharge, redness, itching and visual disturbance.  Respiratory: Negative for cough, choking, chest tightness, shortness of breath, wheezing and stridor.   Cardiovascular: Negative for chest pain, palpitations and leg swelling.  Gastrointestinal: Negative for abdominal distention.  Genitourinary: Negative for dysuria, urgency, frequency, hematuria, flank pain, decreased urine volume, difficulty urinating and dyspareunia.  Musculoskeletal: Negative for back pain, joint swelling, arthralgias and gait problem.  Neurological: Negative for dizziness, tremors, seizures, syncope, facial asymmetry, speech difficulty, weakness, light-headedness, numbness and headaches.  Hematological: Negative for adenopathy. Does not bruise/bleed easily.  Psychiatric/Behavioral: Negative for hallucinations, behavioral problems, confusion, dysphoric mood, decreased concentration and agitation.    Objective:   Filed Vitals:   04/17/14 1009  BP: 101/56  Pulse: 88  Temp: 98.4 F (36.9 C)  Resp: 16    Physical Exam  Constitutional: Appears well-developed and well-nourished. No distress.  HENT: Normocephalic. External right and left ear normal. Oropharynx is clear and moist.  Eyes: Conjunctivae and EOM are normal. PERRLA, no scleral icterus.  Neck: Normal ROM. Neck supple. No JVD. No tracheal deviation. No thyromegaly.  CVS: RRR, S1/S2 +, no murmurs, no gallops, no carotid bruit.  Pulmonary: Effort and breath sounds normal, no stridor, rhonchi, wheezes, rales.  Abdominal: Soft. BS +,  no distension, tenderness, rebound or guarding.  Musculoskeletal: Normal range of motion. No edema and no tenderness.  Lymphadenopathy: No lymphadenopathy noted, cervical, inguinal. Neuro: Alert. Normal reflexes, muscle tone coordination. No cranial  nerve deficit. Skin: Skin is warm and dry. No rash noted. Not diaphoretic. No erythema. No pallor.  Psychiatric: Normal mood and affect. Behavior, judgment, thought content normal.   Lab Results  Component Value Date   WBC 9.2 04/02/2014   HGB 11.7* 04/02/2014   HCT 34.4* 04/02/2014   MCV 88.7 04/02/2014   PLT 307 04/02/2014   Lab Results  Component Value Date   CREATININE 0.70 04/04/2014   BUN 5* 04/04/2014   NA 143 04/04/2014   K 4.0 04/04/2014   CL 110 04/04/2014   CO2 20 04/04/2014    Lab Results  Component  Value Date   HGBA1C 7.8* 01/19/2014   Lipid Panel     Component Value Date/Time   CHOL 152 12/31/2011 0440   TRIG 101 08/15/2013 1454   HDL 62 08/15/2013 1454   HDL 28* 12/31/2011 0440   CHOLHDL 3.6 08/15/2013 1454   CHOLHDL 5.4 12/31/2011 0440   VLDL 53* 12/31/2011 0440   LDLCALC 143* 08/15/2013 1454   LDLCALC 71 12/31/2011 0440       Assessment and plan:   Patient Active Problem List   Diagnosis Date Noted  . DIABETES MELLITUS, TYPE I 05/14/2010    Priority: Medium - we will see th ept in next 3 weeks for CBG's His CBGs are relatively good, only 3 numbers less than 50 (once 8 am once 2 pm and once 8 pm). Most numbers in 130 -220 range. Continue lantus 15 units daily. May need later pm dose as well. - continue SSI

## 2014-04-20 ENCOUNTER — Telehealth: Payer: Self-pay | Admitting: Internal Medicine

## 2014-04-20 NOTE — Telephone Encounter (Signed)
Pt. Would like to schedule to and appt. With Dr. Elisabeth Pigeon......pt. Needs dmv paperwork to be filled out within 30 days

## 2014-04-24 ENCOUNTER — Emergency Department (HOSPITAL_COMMUNITY)
Admission: EM | Admit: 2014-04-24 | Discharge: 2014-04-24 | Disposition: A | Discharge status: Home / Self Care | Payer: Medicare Other | Attending: Emergency Medicine | Admitting: Emergency Medicine

## 2014-04-24 ENCOUNTER — Encounter (HOSPITAL_COMMUNITY): Payer: Self-pay | Admitting: Emergency Medicine

## 2014-04-24 ENCOUNTER — Ambulatory Visit: Payer: Medicare Other | Admitting: Internal Medicine

## 2014-04-24 DIAGNOSIS — I10 Essential (primary) hypertension: Secondary | ICD-10-CM | POA: Diagnosis not present

## 2014-04-24 DIAGNOSIS — Z794 Long term (current) use of insulin: Secondary | ICD-10-CM | POA: Insufficient documentation

## 2014-04-24 DIAGNOSIS — I509 Heart failure, unspecified: Secondary | ICD-10-CM | POA: Insufficient documentation

## 2014-04-24 DIAGNOSIS — D62 Acute posthemorrhagic anemia: Secondary | ICD-10-CM | POA: Diagnosis not present

## 2014-04-24 DIAGNOSIS — E119 Type 2 diabetes mellitus without complications: Secondary | ICD-10-CM | POA: Insufficient documentation

## 2014-04-24 DIAGNOSIS — R4182 Altered mental status, unspecified: Secondary | ICD-10-CM | POA: Diagnosis present

## 2014-04-24 DIAGNOSIS — I252 Old myocardial infarction: Secondary | ICD-10-CM | POA: Insufficient documentation

## 2014-04-24 DIAGNOSIS — I2510 Atherosclerotic heart disease of native coronary artery without angina pectoris: Secondary | ICD-10-CM | POA: Insufficient documentation

## 2014-04-24 DIAGNOSIS — E162 Hypoglycemia, unspecified: Secondary | ICD-10-CM

## 2014-04-24 DIAGNOSIS — E039 Hypothyroidism, unspecified: Secondary | ICD-10-CM | POA: Insufficient documentation

## 2014-04-24 DIAGNOSIS — F411 Generalized anxiety disorder: Secondary | ICD-10-CM | POA: Diagnosis not present

## 2014-04-24 DIAGNOSIS — Z79899 Other long term (current) drug therapy: Secondary | ICD-10-CM | POA: Diagnosis not present

## 2014-04-24 DIAGNOSIS — Z8739 Personal history of other diseases of the musculoskeletal system and connective tissue: Secondary | ICD-10-CM | POA: Insufficient documentation

## 2014-04-24 DIAGNOSIS — Z8709 Personal history of other diseases of the respiratory system: Secondary | ICD-10-CM | POA: Diagnosis not present

## 2014-04-24 DIAGNOSIS — H409 Unspecified glaucoma: Secondary | ICD-10-CM | POA: Insufficient documentation

## 2014-04-24 DIAGNOSIS — G8929 Other chronic pain: Secondary | ICD-10-CM | POA: Diagnosis not present

## 2014-04-24 DIAGNOSIS — Z7982 Long term (current) use of aspirin: Secondary | ICD-10-CM | POA: Insufficient documentation

## 2014-04-24 DIAGNOSIS — F17200 Nicotine dependence, unspecified, uncomplicated: Secondary | ICD-10-CM | POA: Diagnosis not present

## 2014-04-24 DIAGNOSIS — Z8781 Personal history of (healed) traumatic fracture: Secondary | ICD-10-CM | POA: Diagnosis not present

## 2014-04-24 DIAGNOSIS — F29 Unspecified psychosis not due to a substance or known physiological condition: Secondary | ICD-10-CM | POA: Diagnosis not present

## 2014-04-24 DIAGNOSIS — Z8719 Personal history of other diseases of the digestive system: Secondary | ICD-10-CM | POA: Diagnosis not present

## 2014-04-24 DIAGNOSIS — F329 Major depressive disorder, single episode, unspecified: Secondary | ICD-10-CM | POA: Insufficient documentation

## 2014-04-24 DIAGNOSIS — F3289 Other specified depressive episodes: Secondary | ICD-10-CM | POA: Insufficient documentation

## 2014-04-24 DIAGNOSIS — E785 Hyperlipidemia, unspecified: Secondary | ICD-10-CM | POA: Diagnosis not present

## 2014-04-24 LAB — CBG MONITORING, ED
Glucose-Capillary: 110 mg/dL — ABNORMAL HIGH (ref 70–99)
Glucose-Capillary: 26 mg/dL — CL (ref 70–99)

## 2014-04-24 MED ORDER — DEXTROSE 50 % IV SOLN
INTRAVENOUS | Status: AC
  Administered 2014-04-24: 25 mL via INTRAVENOUS
  Filled 2014-04-24: qty 50

## 2014-04-24 NOTE — ED Notes (Signed)
When trying to ask pt if he has any pain he responds with "Fuck you!"

## 2014-04-24 NOTE — ED Notes (Signed)
Pt refusing blood work because he states he had it done couple weeks ago.  [facility] made aware.

## 2014-04-24 NOTE — ED Provider Notes (Signed)
CSN: [id number]     Arrival date & time 04/24/14  1138 History   First MD Initiated Contact with Patient 04/24/14 1144     Chief Complaint  Patient presents with  . Altered Mental Status     (Consider location/radiation/quality/duration/timing/severity/associated sxs/prior Treatment) HPI Comments: The patient brought to the ER for evaluation of mental status changes. Mother reports that he has been sleeping all day, behaving slightly different for himself over the last week. He was noted to be very altered today and brought to the ER for a glucose of 26. Patient does have a history of diabetes. He has not been eating or drinking this week. He did have a hospitalization approximately 2 weeks ago for hypoglycemia and mental status changes. Mother is concerned because he has been starting to use drugs, specifically marijuana. Patient angry that he was brought to the ER, stating that he wants to leave.  Patient is a 48 y.o. male presenting with altered mental status.  Altered Mental Status Presenting symptoms: confusion     Past Medical History  Diagnosis Date  . Diabetes mellitus type 1   . Hyperlipidemia   . Depression   . Umbilical hernia   . Hypertension   . Glaucoma   . CHF (congestive heart failure)   . Heart attack 12/16/11  . Hypothyroidism   . Hypoglycemia, unspecified   . Disorder of bone and cartilage, unspecified   . Acute posthemorrhagic anemia   . Anxiety state, unspecified   . Chronic pain syndrome   . Acute respiratory failure   . Dysphagia, oral phase   . Stress fracture of tibia or fibula   . Syncope and collapse   . Coronary artery disease 07/19/2012  . NSTEMI (non-ST elevated myocardial infarction) 12/19/2011  . Ischemic cardiomyopathy   . Narcotic dependency, continuous    Past Surgical History  Procedure Laterality Date  . Appendectomy      open  . Tibia im nail insertion  12/18/2011    Procedure: INTRAMEDULLARY (IM) NAIL TIBIAL;  Surgeon: Eugenia Mcalpine,  MD;  Location: [facility] ORS;  Service: Orthopedics;  Laterality: Bilateral;  . Insertion of dialysis catheter  12/31/2011    Procedure: INSERTION OF DIALYSIS CATHETER;  Surgeon: Chuck Hint, MD;  Location: [facility] OR;  Service: Vascular;  Laterality: N/A;  . Tonsillectomy  1980  . Fracture surgery  2012    wrist  . Fracture surgery  2013    bilateral tibia   Family History  Problem Relation Age of Onset  . Heart disease Father    History  Substance Use Topics  . Smoking status: Current Some Day Smoker -- 1.00 packs/day    Types: Cigarettes  . Smokeless tobacco: Never Used  . Alcohol Use: No    Review of Systems  Psychiatric/Behavioral: Positive for confusion.  All other systems reviewed and are negative.     Allergies  Review of patient's allergies indicates no known allergies.  Home Medications   Prior to Admission medications   Medication Sig Start Date End Date Taking? Authorizing Provider  ACCU-CHEK AVIVA PLUS test strip TEST 6 TIMES A DAY AS DIRECTED. 05/04/13   Romero Belling, MD  ARIPiprazole (ABILIFY) 5 MG tablet Take 1 tablet (5 mg total) by mouth 2 (two) times daily. 04/06/14   Alison Murray, MD  aspirin EC 81 MG tablet Take 81 mg by mouth daily.    Historical Provider, MD  atorvastatin (LIPITOR) 40 MG tablet Take 40 mg by mouth daily.  Historical Provider, MD  B-D INS SYRINGE 0.5CC/30GX1/2" 30G X 1/2" 0.5 ML MISC use as directed 02/12/14   Reather Littler, MD  DULoxetine 40 MG CPEP Take 40 mg by mouth daily. 04/06/14   Alison Murray, MD  ferrous sulfate 325 (65 FE) MG tablet Take 325 mg by mouth daily with breakfast.    Historical Provider, MD  glucagon (GLUCAGON EMERGENCY) 1 MG injection Inject 1 mg into the muscle once as needed (for low blood sugar). 03/19/14   Romero Belling, MD  glucose blood test strip TEST BLOOD SUGAR 6 TIMES A DAY AS INSTRUCTED BY DR Everardo All 05/04/13   Romero Belling, MD  Insulin Glargine (LANTUS SOLOSTAR) 100 UNIT/ML Solostar Pen Inject 10 Units into the  skin daily at 10 pm. 04/06/14   Alison Murray, MD  insulin regular (NOVOLIN R,HUMULIN R) 100 units/mL injection Inject 0-0.05 mLs (0-5 Units total) into the skin 4 (four) times daily as needed for high blood sugar (cbg >200). CBG 201 - 250: 3 units        CBG 251 - 300: 5 units        CBG 301 - 350: 7 units        CBG 351 - 400 9 units 04/06/14   Alison Murray, MD  INSULIN SYRINGE .5CC/29G 29G X 1/2" 0.5 ML MISC 1 Syringe by Does not apply route daily. 05/18/13   Romero Belling, MD  levothyroxine (SYNTHROID, LEVOTHROID) 125 MCG tablet Take 1 tablet (125 mcg total) by mouth daily before breakfast. 09/04/13   Romero Belling, MD  lisinopril (PRINIVIL,ZESTRIL) 40 MG tablet Take 0.5 tablets (20 mg total) by mouth daily. 08/15/13   Oneal Grout, MD  LORazepam (ATIVAN) 2 MG tablet Take 1 tablet (2 mg) twice daily, may take an additional tablet during the day for anxiety if needed 03/19/14   Tiffany L Reed, DO  metoprolol tartrate (LOPRESSOR) 25 MG tablet Take 0.5 tablets (12.5 mg total) by mouth 2 (two) times daily. 08/15/13   Oneal Grout, MD  Multiple Vitamin (MULTIVITAMIN WITH MINERALS) TABS tablet Take 1 tablet by mouth daily. Diabetes daily pak from [employer]    Historical Provider, MD  Oxycodone HCl 20 MG TABS Take 1 tablet (20 mg total) by mouth 4 (four) times daily as needed. 04/06/14   Alison Murray, MD   BP 133/75  Pulse 60  Temp(Src) 97.8 F (36.6 C) (Oral)  Resp 16  Wt 156 lb (70.761 kg)  SpO2 99% Physical Exam  Constitutional: He is oriented to person, place, and time. He appears well-developed and well-nourished. No distress.  HENT:  Head: Normocephalic and atraumatic.  Right Ear: Hearing normal.  Left Ear: Hearing normal.  Nose: Nose normal.  Mouth/Throat: Oropharynx is clear and moist and mucous membranes are normal.  Eyes: Conjunctivae and EOM are normal. Pupils are equal, round, and reactive to light.  Neck: Normal range of motion. Neck supple.  Cardiovascular: Regular rhythm, S1 normal and  S2 normal.  Exam reveals no gallop and no friction rub.   No murmur heard. Pulmonary/Chest: Effort normal and breath sounds normal. No respiratory distress. He exhibits no tenderness.  Abdominal: Soft. Normal appearance and bowel sounds are normal. There is no hepatosplenomegaly. There is no tenderness. There is no rebound, no guarding, no tenderness at McBurney's point and negative Murphy's sign. No hernia.  Musculoskeletal: Normal range of motion.  Neurological: He is alert and oriented to person, place, and time. He has normal strength. No cranial nerve deficit or  sensory deficit. Coordination normal. GCS eye subscore is 4. GCS verbal subscore is 5. GCS motor subscore is 6.  Skin: Skin is warm, dry and intact. No rash noted. No cyanosis.  Psychiatric: He has a normal mood and affect. His speech is normal and behavior is normal. Thought content normal.    ED Course  Procedures (including critical care time) Labs Review Labs Reviewed  CBG MONITORING, ED - Abnormal; Notable for the following:    Glucose-Capillary 26 (*)    All other components within normal limits  CBG MONITORING, ED - Abnormal; Notable for the following:    Glucose-Capillary 110 (*)    All other components within normal limits  CBC WITH DIFFERENTIAL  COMPREHENSIVE METABOLIC PANEL  URINALYSIS, ROUTINE W REFLEX MICROSCOPIC  URINE RAPID DRUG SCREEN (HOSP PERFORMED)    Imaging Review No results found.   EKG Interpretation None      MDM   Final diagnoses:  Hypoglycemia    The patient was initially agitated upon arrival. This was likely multifactorial. Patient has been having a disagreement over his lifestyle and his mother recently. He was angry at mother for being brought to the ER. He was also, however, found to be hyperglycemic. This was treated with oral glucose followed by IV glucose. Recheck blood sugar was normal. Patient calmed down after this, his aggressive behavior stopped. He now states that he wants  to go home. He reports that he will go home and he can't take his medications as prescribed. He says he'll do a better job of taking care of himself. He is not homicidal or suicidal.  Mother asking for IVC because of his behavior. He has no homicidal or suicidal. I do not see any indication for involuntary commitment. Patient is demanding to be discharged, to be held against his will. I did review the records from previous hospitalization. During that stay he had a specific consult by psychiatry for capacity was found to have capacity to make his own decisions. Nothing is changed in the interim. He therefore cannot be held in the ER any longer and will be discharged.  I did have a discussion with him about his safety. He understands that he needs to eat if he is taking his insulin and diabetes medications. He needs to take care of himself. He was told that if he were to have a prolonged hypoglycemic episode he would suffer brain damage or death. He understands this and states that he will "take better care of himself"    Gilda Crease, MD 04/24/14 1246

## 2014-04-24 NOTE — Discharge Instructions (Signed)
Blood Glucose Monitoring °Monitoring your blood glucose (also know as blood sugar) helps you to manage your diabetes. It also helps you and your health care provider monitor your diabetes and determine how well your treatment plan is working. °WHY SHOULD YOU MONITOR YOUR BLOOD GLUCOSE? °· It can help you understand how food, exercise, and medicine affect your blood glucose. °· It allows you to know what your blood glucose is at any given moment. You can quickly tell if you are having low blood glucose (hypoglycemia) or high blood glucose (hyperglycemia). °· It can help you and your health care provider know how to adjust your medicines. °· It can help you understand how to manage an illness or adjust medicine for exercise. °WHEN SHOULD YOU TEST? °Your health care provider will help you decide how often you should check your blood glucose. This may depend on the type of diabetes you have, your diabetes control, or the types of medicines you are taking. Be sure to write down all of your blood glucose readings so that this information can be reviewed with your health care provider. See below for examples of testing times that your health care provider may suggest. °Type 1 Diabetes °· Test 4 times a day if you are in good control, using an insulin pump, or perform multiple daily injections. °· If your diabetes is not well controlled or if you are sick, you may need to monitor more often. °· It is a good idea to also monitor: °· Before and after exercise. °· Between meals and 2 hours after a meal. °· Occasionally between 2:00 a.m. and 3:00 a.m. °Type 2 Diabetes °· It can vary with each person, but generally, if you are on insulin, test 4 times a day. °· If you take medicines by mouth (orally), test 2 times a day. °· If you are on a controlled diet, test once a day. °· If your diabetes is not well controlled or if you are sick, you may need to monitor more often. °HOW TO MONITOR YOUR BLOOD GLUCOSE °Supplies  Needed °· Blood glucose meter. °· Test strips for your meter. Each meter has its own strips. You must use the strips that go with your own meter. °· A pricking needle (lancet). °· A device that holds the lancet (lancing device). °· A journal or log book to write down your results. °Procedure °· Wash your hands with soap and water. Alcohol is not preferred. °· Prick the side of your finger (not the tip) with the lancet. °· Gently milk the finger until a small drop of blood appears. °· Follow the instructions that come with your meter for inserting the test strip, applying blood to the strip, and using your blood glucose meter. °Other Areas to Get Blood for Testing °Some meters allow you to use other areas of your body (other than your finger) to test your blood. These areas are called alternative sites. The most common alternative sites are: °· The forearm. °· The thigh. °· The back area of the lower leg. °· The palm of the hand. °The blood flow in these areas is slower. Therefore, the blood glucose values you get may be delayed, and the numbers are different from what you would get from your fingers. Do not use alternative sites if you think you are having hypoglycemia. Your reading will not be accurate. Always use a finger if you are having hypoglycemia. Also, if you cannot feel your lows (hypoglycemia unawareness), always use your fingers for your   blood glucose checks. °ADDITIONAL TIPS FOR GLUCOSE MONITORING °· Do not reuse lancets. °· Always carry your supplies with you. °· All blood glucose meters have a 24-hour "hotline" number to call if you have questions or need help. °· Adjust (calibrate) your blood glucose meter with a control solution after finishing a few boxes of strips. °BLOOD GLUCOSE RECORD KEEPING °It is a good idea to keep a daily record or log of your blood glucose readings. Most glucose meters, if not all, keep your glucose records stored in the meter. Some meters come with the ability to download  your records to your home computer. Keeping a record of your blood glucose readings is especially helpful if you are wanting to look for patterns. Make notes to go along with the blood glucose readings because you might forget what happened at that exact time. Keeping good records helps you and your health care provider to work together to achieve good diabetes management.  °Document Released: 08/20/2003 Document Revised: 01/01/2014 Document Reviewed: 01/09/2013 °Exit[employer]® Patient Information ©2015 [employer]. This information is not intended to replace advice given to you by your health care provider. Make sure you discuss any questions you have with your health care provider. ° °Hypoglycemia °Hypoglycemia occurs when the glucose in your blood is too low. Glucose is a type of sugar that is your body's main energy source. Hormones, such as insulin and glucagon, control the level of glucose in the blood. Insulin lowers blood glucose and glucagon increases blood glucose. Having too much insulin in your blood stream, or not eating enough food containing sugar, can result in hypoglycemia. Hypoglycemia can happen to people with or without diabetes. It can develop quickly and can be a medical emergency.  °CAUSES  °· Missing or delaying meals. °· Not eating enough carbohydrates at meals. °· Taking too much diabetes medicine. °· Not timing your oral diabetes medicine or insulin doses with meals, snacks, and exercise. °· Nausea and vomiting. °· Certain medicines. °· Severe illnesses, such as hepatitis, kidney disorders, and certain eating disorders. °· Increased activity or exercise without eating something extra or adjusting medicines. °· Drinking too much alcohol. °· A nerve disorder that affects body functions like your heart rate, blood pressure, and digestion (autonomic neuropathy). °· A condition where the stomach muscles do not function properly (gastroparesis). Therefore, medicines and food may not absorb  properly. °· Rarely, a tumor of the pancreas can produce too much insulin. °SYMPTOMS  °· Hunger. °· Sweating (diaphoresis). °· Change in body temperature. °· Shakiness. °· Headache. °· Anxiety. °· Lightheadedness. °· Irritability. °· Difficulty concentrating. °· Dry mouth. °· Tingling or numbness in the hands or feet. °· Restless sleep or sleep disturbances. °· Altered speech and coordination. °· Change in mental status. °· Seizures or prolonged convulsions. °· Combativeness. °· Drowsiness (lethargic). °· Weakness. °· Increased heart rate or palpitations. °· Confusion. °· Pale, gray skin color. °· Blurred or double vision. °· Fainting. °DIAGNOSIS  °A physical exam and medical history will be performed. Your caregiver may make a diagnosis based on your symptoms. Blood tests and other lab tests may be performed to confirm a diagnosis. Once the diagnosis is made, your caregiver will see if your signs and symptoms go away once your blood glucose is raised.  °TREATMENT  °Usually, you can easily treat your hypoglycemia when you notice symptoms. °· Check your blood glucose. If it is less than 70 mg/dl, take one of the following:   °¨ 3-4 glucose tablets.   °¨ ½ cup   juice.   °¨ ½ cup regular soda.   °¨ 1 cup skim milk.   °¨ ½-1 tube of glucose gel.   °¨ 5-6 hard candies.   °· Avoid high-fat drinks or food that may delay a rise in blood glucose levels. °· Do not take more than the recommended amount of sugary foods, drinks, gel, or tablets. Doing so will cause your blood glucose to go too high.   °· Wait 10-15 minutes and recheck your blood glucose. If it is still less than 70 mg/dl or below your target range, repeat treatment.   °· Eat a snack if it is more than 1 hour until your next meal.   °There may be a time when your blood glucose may go so low that you are unable to treat yourself at home when you start to notice symptoms. You may need someone to help you. You may even faint or be unable to swallow. If you cannot  treat yourself, someone will need to bring you to the hospital.  °HOME CARE INSTRUCTIONS °· If you have diabetes, follow your diabetes management plan by: °¨ Taking your medicines as directed. °¨ Following your exercise plan. °¨ Following your meal plan. Do not skip meals. Eat on time. °¨ Testing your blood glucose regularly. Check your blood glucose before and after exercise. If you exercise longer or different than usual, be sure to check blood glucose more frequently. °¨ Wearing your medical alert jewelry that says you have diabetes. °· Identify the cause of your hypoglycemia. Then, develop ways to prevent the recurrence of hypoglycemia. °· Do not take a hot bath or shower right after an insulin shot. °· Always carry treatment with you. Glucose tablets are the easiest to carry. °· If you are going to drink alcohol, drink it only with meals. °· Tell friends or family members ways to keep you safe during a seizure. This may include removing hard or sharp objects from the area or turning you on your side. °· Maintain a healthy weight. °SEEK MEDICAL CARE IF:  °· You are having problems keeping your blood glucose in your target range. °· You are having frequent episodes of hypoglycemia. °· You feel you might be having side effects from your medicines. °· You are not sure why your blood glucose is dropping so low. °· You notice a change in vision or a new problem with your vision. °SEEK IMMEDIATE MEDICAL CARE IF:  °· Confusion develops. °· A change in mental status occurs. °· The inability to swallow develops. °· Fainting occurs. °Document Released: 08/17/2005 Document Revised: 08/22/2013 Document Reviewed: 12/14/2011 °ExitCare® Patient Information ©2015 [employer]. This information is not intended to replace advice given to you by your health care provider. Make sure you discuss any questions you have with your health care provider. ° °

## 2014-04-24 NOTE — ED Notes (Signed)
Pt refusing to be given Dextrose through IV. Pt drinking oral glucagon gel 15 grams and cup of orange juice.  Pt stating "Get me the fuck out of here, I dont know why you are letting this shit happen to me.  There is nothing wrong with my heart.  Pt trying to jerk off tele leads, while explaining to the pt that we need to monitor him he states, "I am going to kill somebody."

## 2014-04-24 NOTE — ED Notes (Signed)
Pt alert, arrives from home, presents with family, pt unsure why he is here, family states pt has not been acting normal, pt verbally abusive in triage, CBG 26, mother states pt recently started smoking "pot"

## 2014-04-25 ENCOUNTER — Telehealth: Payer: Self-pay | Admitting: Emergency Medicine

## 2014-04-25 ENCOUNTER — Telehealth: Payer: Self-pay | Admitting: Internal Medicine

## 2014-04-25 NOTE — Telephone Encounter (Signed)
Left message with pt that Dr. Elisabeth Pigeon is not FT PCP here. He will need to schedule appointment with another provider.

## 2014-04-25 NOTE — Telephone Encounter (Signed)
Pt. Called again demanding to see Dr. Elisabeth Pigeon, Pt. Was told that Dr. Elisabeth Pigeon was no longer a PCP at [facility] and would have to schedule an appt with a different provider, Pt. Refused to make an appt and wants to get in touch with Dr. Elisabeth Pigeon. Call was transferred to clinical.

## 2014-04-25 NOTE — Telephone Encounter (Signed)
Pt. Calling back to make an appt with Dr. Elisabeth Pigeon. Pt would like to get some paperwork for DMV filled out within 30 days. Please f/u with pt.

## 2014-04-30 ENCOUNTER — Other Ambulatory Visit: Payer: Self-pay

## 2014-04-30 MED ORDER — INSULIN REGULAR HUMAN 100 UNIT/ML IJ SOLN: INTRAMUSCULAR | Status: DC

## 2014-05-02 ENCOUNTER — Other Ambulatory Visit: Payer: Self-pay | Admitting: Internal Medicine

## 2014-05-03 ENCOUNTER — Encounter: Payer: Self-pay | Admitting: *Deleted

## 2014-05-03 NOTE — Telephone Encounter (Signed)
error 

## 2014-05-09 ENCOUNTER — Other Ambulatory Visit: Payer: Self-pay | Admitting: Internal Medicine

## 2014-05-21 ENCOUNTER — Other Ambulatory Visit: Payer: Self-pay | Admitting: Internal Medicine

## 2014-05-23 ENCOUNTER — Telehealth: Payer: Self-pay | Admitting: Endocrinology

## 2014-05-23 NOTE — Telephone Encounter (Signed)
Patient would like to know if Dr Everardo All would fill out his Driving report. He would like to know before his appt tomorrow.  Please advise

## 2014-05-23 NOTE — Telephone Encounter (Signed)
Pt missed his appt.  To call him for a follow up visit....cdavis

## 2014-05-23 NOTE — Telephone Encounter (Signed)
Called pt and advised that he could bring the dm clearance form for his DM.

## 2014-05-25 ENCOUNTER — Ambulatory Visit: Payer: Self-pay | Admitting: Endocrinology

## 2014-06-04 ENCOUNTER — Encounter: Payer: Self-pay | Admitting: Endocrinology

## 2014-06-04 ENCOUNTER — Ambulatory Visit (INDEPENDENT_AMBULATORY_CARE_PROVIDER_SITE_OTHER): Payer: Medicare Other | Admitting: Endocrinology

## 2014-06-04 VITALS — BP 124/68 | HR 73 | Temp 98.4°F | Ht 66.0 in | Wt 172.0 lb

## 2014-06-04 DIAGNOSIS — E1021 Type 1 diabetes mellitus with diabetic nephropathy: Secondary | ICD-10-CM

## 2014-06-04 DIAGNOSIS — I2510 Atherosclerotic heart disease of native coronary artery without angina pectoris: Secondary | ICD-10-CM

## 2014-06-04 NOTE — Patient Instructions (Addendum)
check your blood sugar 6 times a day--before the 3 meals, and at bedtime.  also check if you have symptoms of your blood sugar being too high or too low.  please keep a record of the readings and bring it to your next appointment here.  please call us sooner if your blood sugar goes below 70, or if it stays over 200. Please come back for a follow-up appointment in 1 month.   On this type of insulin schedule, you should eat meals on a regular schedule.  If a meal is missed or significantly delayed, your blood sugar could go low. Please reduce the lantus to 15 units each morning.  take the regular insulin just 2 units for any blood sugar over 250.   blood tests are being requested for you today.  We'll contact you with results.    As of now, it is not safe for you to drive a car.   It is extremely important for you to see your primary provider ASAP.

## 2014-06-04 NOTE — Progress Notes (Signed)
Subjective:    Patient ID: Reginald Gutierrez, male    DOB: 1965/11/17, 48 y.o.   MRN: [id number]  HPI Pt returns for f/u of diabetes mellitus:  DM type: 1 Dx'ed: 1969 Complications: renal failure, retinopathy, PAD, and CAD (refused CABG) Therapy: insulin since  DKA: never Severe hypoglycemia: recurrent, most recently in 2015 Pancreatitis: never Other: due to noncompliance, he has required a simple qd insulin regimen; in late 2013, he had a episode of severe hypoglycemia.    Interval history:  Since last ov, he has had an episode of severe hypoglycemia.  He says he does not know how this happened.  He takes 25 units of lantus qhs, and regular 1-5 units of reg insulin with meals, according to cho content.  no cbg record, but states cbg's are persistently over 200.  He says he has mild hypoglycemia approx 1-2 times per month, usually in the middle of the night.  He requests refill of pain medication, and to do a DMV form. Past Medical History  Diagnosis Date  . Diabetes mellitus type 1   . Hyperlipidemia   . Depression   . Umbilical hernia   . Hypertension   . Glaucoma   . CHF (congestive heart failure)   . Heart attack 12/16/11  . Hypothyroidism   . Hypoglycemia, unspecified   . Disorder of bone and cartilage, unspecified   . Acute posthemorrhagic anemia   . Anxiety state, unspecified   . Chronic pain syndrome   . Acute respiratory failure   . Dysphagia, oral phase   . Stress fracture of tibia or fibula   . Syncope and collapse   . Coronary artery disease 07/19/2012  . NSTEMI (non-ST elevated myocardial infarction) 12/19/2011  . Ischemic cardiomyopathy   . Narcotic dependency, continuous     Past Surgical History  Procedure Laterality Date  . Appendectomy      open  . Tibia im nail insertion  12/18/2011    Procedure: INTRAMEDULLARY (IM) NAIL TIBIAL;  Surgeon: Eugenia Mcalpineobert Collins, MD;  Location: [facility];  Service: Orthopedics;  Laterality: Bilateral;  . Insertion of dialysis  catheter  12/31/2011    Procedure: INSERTION OF DIALYSIS CATHETER;  Surgeon: Chuck Hinthristopher S Dickson, MD;  [facility];  Service: Vascular;  Laterality: N/A;  . Tonsillectomy  1980  . Fracture surgery  2012    wrist  . Fracture surgery  2013    bilateral tibia    History   Social History  . Marital Status: Single    Spouse Name: N/A    Number of Children: 0  . Years of Education: 16   Occupational History  . Sales- disabled    Social History Main Topics  . Smoking status: Current Some Day Smoker -- 1.00 packs/day    Types: Cigarettes  . Smokeless tobacco: Never Used  . Alcohol Use: No  . Drug Use: No     Comment: Past Cocaine abuse-2002-2003  . Sexual Activity: No   Other Topics Concern  . Not on file   Social History Narrative  . No narrative on file    Current Outpatient Prescriptions on File Prior to Visit  Medication Sig Dispense Refill  . ACCU-CHEK AVIVA PLUS test strip TEST 6 TIMES A DAY AS DIRECTED.  150 each  12  . ARIPiprazole (ABILIFY) 5 MG tablet Take 1 tablet (5 mg total) by mouth 2 (two) times daily.  60 tablet  0  . aspirin EC 81 MG tablet Take 81 mg by  mouth daily.      Marland Kitchen atorvastatin (LIPITOR) 40 MG tablet Take 40 mg by mouth daily.      Marland Kitchen atorvastatin (LIPITOR) 40 MG tablet take 1 tablet by mouth once daily  30 tablet  3  . B-D INS SYRINGE 0.5CC/30GX1/2" 30G X 1/2" 0.5 ML MISC use as directed  100 each  0  . DULoxetine 40 MG CPEP Take 40 mg by mouth daily.  30 capsule  0  . ferrous sulfate 325 (65 FE) MG tablet Take 325 mg by mouth daily with breakfast.      . glucagon (GLUCAGON EMERGENCY) 1 MG injection Inject 1 mg into the muscle once as needed (for low blood sugar).  1 each  2  . glucose blood test strip TEST BLOOD SUGAR 6 TIMES A DAY AS INSTRUCTED BY DR Treysean Petruzzi  200 each  10  . INSULIN SYRINGE .5CC/29G 29G X 1/2" 0.5 ML MISC 1 Syringe by Does not apply route daily.  30 each  11  . levothyroxine (SYNTHROID, LEVOTHROID) 125 MCG tablet Take 1 tablet  (125 mcg total) by mouth daily before breakfast.  30 tablet  11  . lisinopril (PRINIVIL,ZESTRIL) 40 MG tablet take 1/2 tablet by mouth once daily  15 tablet  4  . LORazepam (ATIVAN) 2 MG tablet Take 1 tablet (2 mg) twice daily, may take an additional tablet during the day for anxiety if needed  60 tablet  0  . metoprolol tartrate (LOPRESSOR) 25 MG tablet Take 0.5 tablets (12.5 mg total) by mouth 2 (two) times daily.  30 tablet  3  . Multiple Vitamin (MULTIVITAMIN WITH MINERALS) TABS tablet Take 1 tablet by mouth daily. Diabetes daily pak from [employer]      . Oxycodone HCl 20 MG TABS Take 1 tablet (20 mg total) by mouth 4 (four) times daily as needed.  10 tablet  0   No current facility-administered medications on file prior to visit.    No Known Allergies  Family History  Problem Relation Age of Onset  . Heart disease Father     BP 124/68  Pulse 73  Temp(Src) 98.4 F (36.9 C) (Oral)  Ht 5\' 6"  (1.676 m)  Wt 172 lb (78.019 kg)  BMI 27.77 kg/m2  SpO2 95%    Review of Systems Denies weight change and n/v.      Objective:   Physical Exam VITAL SIGNS:  See vs page GENERAL: no distress Pulses: dorsalis pedis intact bilat, but decreased from normal.  Feet: no deformity.  no edema.  Skin: no ulcer on the feet. normal color and temp of the feet. Neuro: sensation is intact to touch on the feet   Lab Results  Component Value Date   HGBA1C 7.8* 01/19/2014       Assessment & Plan:  DM: uncertain control, complicated by severe hypoglycemia. Noncompliance with cbg recording and insulin dosing: I'll work around this as best I can. Episode of LOC. i told pt he should not drive for now.     Patient is advised the following: Patient Instructions  check your blood sugar 6 times a day--before the 3 meals, and at bedtime.  also check if you have symptoms of your blood sugar being too high or too low.  please keep a record of the readings and bring it to your next appointment here.   please call us sooner if your blood sugar goes below 70, or if it stays over 200. Please come back for a follow-up appointment  in 1 month.   On this type of insulin schedule, you should eat meals on a regular schedule.  If a meal is missed or significantly delayed, your blood sugar could go low. Please reduce the lantus to 15 units each morning.  take the regular insulin just 2 units for any blood sugar over 250.   blood tests are being requested for you today.  We'll contact you with results.    As of now, it is not safe for you to drive a car.   It is extremely important for you to see your primary provider ASAP.

## 2014-06-13 ENCOUNTER — Other Ambulatory Visit: Payer: Self-pay

## 2014-06-13 MED ORDER — INSULIN GLARGINE 100 UNIT/ML SOLOSTAR PEN: 15.0000 [IU] | PEN_INJECTOR | SUBCUTANEOUS | Status: DC

## 2014-06-14 ENCOUNTER — Other Ambulatory Visit: Payer: Self-pay | Admitting: Endocrinology

## 2014-06-15 ENCOUNTER — Other Ambulatory Visit: Payer: Self-pay

## 2014-06-15 MED ORDER — "INSULIN SYRINGE-NEEDLE U-100 30G X 1/2"" 0.5 ML MISC": Status: AC

## 2014-06-20 ENCOUNTER — Other Ambulatory Visit: Payer: Self-pay | Admitting: Internal Medicine

## 2014-07-02 ENCOUNTER — Other Ambulatory Visit: Payer: Self-pay

## 2014-07-02 MED ORDER — INSULIN GLARGINE 100 UNIT/ML SOLOSTAR PEN: 15.0000 [IU] | PEN_INJECTOR | SUBCUTANEOUS | Status: DC

## 2014-07-03 ENCOUNTER — Emergency Department (HOSPITAL_COMMUNITY): Payer: Medicare Other

## 2014-07-03 ENCOUNTER — Encounter (HOSPITAL_COMMUNITY): Payer: Self-pay | Admitting: Emergency Medicine

## 2014-07-03 ENCOUNTER — Inpatient Hospital Stay (HOSPITAL_COMMUNITY)
Admission: EM | Admit: 2014-07-03 | Discharge: 2014-07-11 | DRG: 228 | Disposition: A | Discharge status: Skilled Nursing Facility | Payer: Medicare Other | Attending: Thoracic Surgery (Cardiothoracic Vascular Surgery) | Admitting: Thoracic Surgery (Cardiothoracic Vascular Surgery)

## 2014-07-03 DIAGNOSIS — E46 Unspecified protein-calorie malnutrition: Secondary | ICD-10-CM | POA: Diagnosis present

## 2014-07-03 DIAGNOSIS — R0902 Hypoxemia: Secondary | ICD-10-CM | POA: Diagnosis present

## 2014-07-03 DIAGNOSIS — I5023 Acute on chronic systolic (congestive) heart failure: Secondary | ICD-10-CM | POA: Diagnosis present

## 2014-07-03 DIAGNOSIS — F17210 Nicotine dependence, cigarettes, uncomplicated: Secondary | ICD-10-CM | POA: Diagnosis present

## 2014-07-03 DIAGNOSIS — Z794 Long term (current) use of insulin: Secondary | ICD-10-CM | POA: Diagnosis not present

## 2014-07-03 DIAGNOSIS — I2582 Chronic total occlusion of coronary artery: Secondary | ICD-10-CM | POA: Diagnosis present

## 2014-07-03 DIAGNOSIS — I5022 Chronic systolic (congestive) heart failure: Secondary | ICD-10-CM | POA: Diagnosis present

## 2014-07-03 DIAGNOSIS — F1120 Opioid dependence, uncomplicated: Secondary | ICD-10-CM | POA: Diagnosis present

## 2014-07-03 DIAGNOSIS — I255 Ischemic cardiomyopathy: Secondary | ICD-10-CM | POA: Diagnosis present

## 2014-07-03 DIAGNOSIS — R0602 Shortness of breath: Secondary | ICD-10-CM | POA: Diagnosis present

## 2014-07-03 DIAGNOSIS — I214 Non-ST elevation (NSTEMI) myocardial infarction: Secondary | ICD-10-CM | POA: Diagnosis present

## 2014-07-03 DIAGNOSIS — Z7982 Long term (current) use of aspirin: Secondary | ICD-10-CM | POA: Diagnosis not present

## 2014-07-03 DIAGNOSIS — Z79899 Other long term (current) drug therapy: Secondary | ICD-10-CM

## 2014-07-03 DIAGNOSIS — Q211 Atrial septal defect: Secondary | ICD-10-CM

## 2014-07-03 DIAGNOSIS — E785 Hyperlipidemia, unspecified: Secondary | ICD-10-CM | POA: Diagnosis present

## 2014-07-03 DIAGNOSIS — E1300 Other specified diabetes mellitus with hyperosmolarity without nonketotic hyperglycemic-hyperosmolar coma (NKHHC): Secondary | ICD-10-CM | POA: Diagnosis present

## 2014-07-03 DIAGNOSIS — E1029 Type 1 diabetes mellitus with other diabetic kidney complication: Secondary | ICD-10-CM | POA: Diagnosis present

## 2014-07-03 DIAGNOSIS — I252 Old myocardial infarction: Secondary | ICD-10-CM

## 2014-07-03 DIAGNOSIS — Z79891 Long term (current) use of opiate analgesic: Secondary | ICD-10-CM

## 2014-07-03 DIAGNOSIS — E875 Hyperkalemia: Secondary | ICD-10-CM | POA: Diagnosis present

## 2014-07-03 DIAGNOSIS — F4323 Adjustment disorder with mixed anxiety and depressed mood: Secondary | ICD-10-CM | POA: Diagnosis present

## 2014-07-03 DIAGNOSIS — E162 Hypoglycemia, unspecified: Secondary | ICD-10-CM | POA: Diagnosis present

## 2014-07-03 DIAGNOSIS — D62 Acute posthemorrhagic anemia: Secondary | ICD-10-CM | POA: Diagnosis not present

## 2014-07-03 DIAGNOSIS — E11649 Type 2 diabetes mellitus with hypoglycemia without coma: Secondary | ICD-10-CM

## 2014-07-03 DIAGNOSIS — E7800 Pure hypercholesterolemia, unspecified: Secondary | ICD-10-CM | POA: Diagnosis present

## 2014-07-03 DIAGNOSIS — I2510 Atherosclerotic heart disease of native coronary artery without angina pectoris: Secondary | ICD-10-CM | POA: Diagnosis present

## 2014-07-03 DIAGNOSIS — E109 Type 1 diabetes mellitus without complications: Secondary | ICD-10-CM | POA: Diagnosis present

## 2014-07-03 DIAGNOSIS — E039 Hypothyroidism, unspecified: Secondary | ICD-10-CM | POA: Diagnosis present

## 2014-07-03 DIAGNOSIS — J9811 Atelectasis: Secondary | ICD-10-CM

## 2014-07-03 DIAGNOSIS — Z951 Presence of aortocoronary bypass graft: Secondary | ICD-10-CM

## 2014-07-03 DIAGNOSIS — I10 Essential (primary) hypertension: Secondary | ICD-10-CM | POA: Diagnosis present

## 2014-07-03 DIAGNOSIS — E1069 Type 1 diabetes mellitus with other specified complication: Secondary | ICD-10-CM

## 2014-07-03 DIAGNOSIS — E1100 Type 2 diabetes mellitus with hyperosmolarity without nonketotic hyperglycemic-hyperosmolar coma (NKHHC): Secondary | ICD-10-CM | POA: Diagnosis present

## 2014-07-03 HISTORY — DX: Presence of aortocoronary bypass graft: Z95.1

## 2014-07-03 LAB — BASIC METABOLIC PANEL
Anion gap: 15 (ref 5–15)
BUN: 22 mg/dL (ref 6–23)
CO2: 27 meq/L (ref 19–32)
Calcium: 9.9 mg/dL (ref 8.4–10.5)
Chloride: 97 mEq/L (ref 96–112)
Creatinine, Ser: 1.17 mg/dL (ref 0.50–1.35)
GFR calc Af Amer: 84 mL/min — ABNORMAL LOW (ref >90)
GFR calc non Af Amer: 72 mL/min — ABNORMAL LOW (ref >90)
Glucose, Bld: 295 mg/dL — ABNORMAL HIGH (ref 70–99)
POTASSIUM: 4.4 meq/L (ref 3.7–5.3)
SODIUM: 139 meq/L (ref 137–147)

## 2014-07-03 LAB — CBC WITH DIFFERENTIAL/PLATELET
BASOS ABS: 0 10*3/uL (ref 0.0–0.1)
BASOS PCT: 0 % (ref 0–1)
BASOS PCT: 0 % (ref 0–1)
Basophils Absolute: 0 10*3/uL (ref 0.0–0.1)
EOS ABS: 0.2 10*3/uL (ref 0.0–0.7)
EOS ABS: 0.3 10*3/uL (ref 0.0–0.7)
EOS PCT: 1 % (ref 0–5)
Eosinophils Relative: 3 % (ref 0–5)
HCT: 38.8 % — ABNORMAL LOW (ref 39.0–52.0)
HEMATOCRIT: 41.6 % (ref 39.0–52.0)
Hemoglobin: 13.8 g/dL (ref 13.0–17.0)
Hemoglobin: 13 g/dL (ref 13.0–17.0)
Lymphocytes Relative: 14 % (ref 12–46)
Lymphocytes Relative: 25 % (ref 12–46)
Lymphs Abs: 2 10*3/uL (ref 0.7–4.0)
Lymphs Abs: 2.7 10*3/uL (ref 0.7–4.0)
MCH: 30.5 pg (ref 26.0–34.0)
MCH: 30.5 pg (ref 26.0–34.0)
MCHC: 33.2 g/dL (ref 30.0–36.0)
MCHC: 33.5 g/dL (ref 30.0–36.0)
MCV: 92 fL (ref 78.0–100.0)
MCV: 91.1 fL (ref 78.0–100.0)
MONO ABS: 1 10*3/uL (ref 0.1–1.0)
MONO ABS: 0.8 10*3/uL (ref 0.1–1.0)
Monocytes Relative: 7 % (ref 3–12)
Monocytes Relative: 7 % (ref 3–12)
NEUTROS PCT: 65 % (ref 43–77)
Neutro Abs: 10.9 10*3/uL — ABNORMAL HIGH (ref 1.7–7.7)
Neutro Abs: 7.1 10*3/uL (ref 1.7–7.7)
Neutrophils Relative %: 78 % — ABNORMAL HIGH (ref 43–77)
PLATELETS: 357 10*3/uL (ref 150–400)
Platelets: 315 10*3/uL (ref 150–400)
RBC: 4.52 MIL/uL (ref 4.22–5.81)
RBC: 4.26 MIL/uL (ref 4.22–5.81)
RDW: 14.4 % (ref 11.5–15.5)
RDW: 14.4 % (ref 11.5–15.5)
WBC: 14 10*3/uL — AB (ref 4.0–10.5)
WBC: 11 10*3/uL — ABNORMAL HIGH (ref 4.0–10.5)

## 2014-07-03 LAB — URINE MICROSCOPIC-ADD ON

## 2014-07-03 LAB — GLUCOSE, CAPILLARY
GLUCOSE-CAPILLARY: 217 mg/dL — AB (ref 70–99)
Glucose-Capillary: 479 mg/dL — ABNORMAL HIGH (ref 70–99)
Glucose-Capillary: 347 mg/dL — ABNORMAL HIGH (ref 70–99)

## 2014-07-03 LAB — COMPREHENSIVE METABOLIC PANEL
ALBUMIN: 4.1 g/dL (ref 3.5–5.2)
ALK PHOS: 101 U/L (ref 39–117)
ALT: 39 U/L (ref 0–53)
AST: 55 U/L — AB (ref 0–37)
Anion gap: 15 (ref 5–15)
BUN: 22 mg/dL (ref 6–23)
CO2: 23 meq/L (ref 19–32)
CREATININE: 1.04 mg/dL (ref 0.50–1.35)
Calcium: 9.4 mg/dL (ref 8.4–10.5)
Chloride: 93 mEq/L — ABNORMAL LOW (ref 96–112)
GFR calc Af Amer: >90 mL/min (ref >90)
GFR calc non Af Amer: 83 mL/min — ABNORMAL LOW (ref >90)
Glucose, Bld: 513 mg/dL — ABNORMAL HIGH (ref 70–99)
Potassium: 7.1 mEq/L (ref 3.7–5.3)
Sodium: 131 mEq/L — ABNORMAL LOW (ref 137–147)
Total Bilirubin: 0.8 mg/dL (ref 0.3–1.2)
Total Protein: 7.7 g/dL (ref 6.0–8.3)

## 2014-07-03 LAB — MAGNESIUM: MAGNESIUM: 1.9 mg/dL (ref 1.5–2.5)

## 2014-07-03 LAB — URINALYSIS, ROUTINE W REFLEX MICROSCOPIC
BILIRUBIN URINE: NEGATIVE
HGB URINE DIPSTICK: NEGATIVE
KETONES UR: 15 mg/dL — AB
Leukocytes, UA: NEGATIVE
Nitrite: NEGATIVE
PH: 6.5 (ref 5.0–8.0)
PROTEIN: NEGATIVE mg/dL
Specific Gravity, Urine: 1.03 (ref 1.005–1.030)
Urobilinogen, UA: 0.2 mg/dL (ref 0.0–1.0)

## 2014-07-03 LAB — CBG MONITORING, ED: Glucose-Capillary: 389 mg/dL — ABNORMAL HIGH (ref 70–99)

## 2014-07-03 LAB — LIPASE, BLOOD: Lipase: 10 U/L — ABNORMAL LOW (ref 11–59)

## 2014-07-03 LAB — TSH: TSH: 16.48 u[IU]/mL — AB (ref 0.350–4.500)

## 2014-07-03 LAB — TROPONIN I
TROPONIN I: 4.98 ng/mL — AB (ref <0.30)
Troponin I: 5.75 ng/mL (ref <0.30)

## 2014-07-03 LAB — MRSA PCR SCREENING: MRSA by PCR: NEGATIVE

## 2014-07-03 LAB — PRO B NATRIURETIC PEPTIDE: Pro B Natriuretic peptide (BNP): 10357 pg/mL — ABNORMAL HIGH (ref 0–125)

## 2014-07-03 MED ORDER — INSULIN GLARGINE 100 UNIT/ML ~~LOC~~ SOLN
18.0000 [IU] | Freq: Every day | SUBCUTANEOUS | Status: DC
  Administered 2014-07-03: 18 [IU] via SUBCUTANEOUS
  Filled 2014-07-03 (×2): qty 0.18

## 2014-07-03 MED ORDER — CHLORHEXIDINE GLUCONATE CLOTH 2 % EX PADS
6.0000 | MEDICATED_PAD | Freq: Once | CUTANEOUS | Status: AC
  Administered 2014-07-03: 6 via TOPICAL

## 2014-07-03 MED ORDER — INSULIN ASPART 100 UNIT/ML ~~LOC~~ SOLN
0.0000 [IU] | Freq: Every day | SUBCUTANEOUS | Status: DC
Start: 2014-07-03 — End: 2014-07-05
  Administered 2014-07-03: 2 [IU] via SUBCUTANEOUS
  Administered 2014-07-04: 4 [IU] via SUBCUTANEOUS

## 2014-07-03 MED ORDER — SODIUM CHLORIDE 0.9 % IV SOLN
1.0000 g | Freq: Once | INTRAVENOUS | Status: AC
  Administered 2014-07-03: 1 g via INTRAVENOUS
  Filled 2014-07-03: qty 10

## 2014-07-03 MED ORDER — INSULIN ASPART 100 UNIT/ML IV SOLN
10.0000 [IU] | Freq: Once | INTRAVENOUS | Status: AC
  Administered 2014-07-03: 10 [IU] via INTRAVENOUS
  Filled 2014-07-03: qty 1

## 2014-07-03 MED ORDER — LEVOTHYROXINE SODIUM 25 MCG PO TABS
125.0000 ug | ORAL_TABLET | Freq: Every day | ORAL | Status: DC
  Administered 2014-07-04 – 2014-07-05 (×2): 125 ug via ORAL
  Filled 2014-07-03 (×4): qty 1

## 2014-07-03 MED ORDER — ARIPIPRAZOLE 5 MG PO TABS
5.0000 mg | ORAL_TABLET | Freq: Two times a day (BID) | ORAL | Status: DC
  Administered 2014-07-03 – 2014-07-05 (×5): 5 mg via ORAL
  Filled 2014-07-03 (×9): qty 1

## 2014-07-03 MED ORDER — FUROSEMIDE 10 MG/ML IJ SOLN
40.0000 mg | Freq: Once | INTRAMUSCULAR | Status: AC
  Administered 2014-07-03: 40 mg via INTRAVENOUS
  Filled 2014-07-03: qty 4

## 2014-07-03 MED ORDER — ONDANSETRON HCL 4 MG PO TABS: 4.0000 mg | ORAL_TABLET | Freq: Four times a day (QID) | ORAL | Status: DC | PRN

## 2014-07-03 MED ORDER — ALBUTEROL (5 MG/ML) CONTINUOUS INHALATION SOLN
10.0000 mg/h | INHALATION_SOLUTION | Freq: Once | RESPIRATORY_TRACT | Status: AC
  Administered 2014-07-03: 10 mg/h via RESPIRATORY_TRACT
  Filled 2014-07-03: qty 20

## 2014-07-03 MED ORDER — DEXTROSE-NACL 5-0.45 % IV SOLN: INTRAVENOUS | Status: DC

## 2014-07-03 MED ORDER — GLUCOSE 40 % PO GEL
1.0000 | ORAL | Status: DC | PRN
  Administered 2014-07-05: 37.5 g via ORAL
  Filled 2014-07-03: qty 1

## 2014-07-03 MED ORDER — SODIUM CHLORIDE 0.9 % IV SOLN: INTRAVENOUS | Status: DC

## 2014-07-03 MED ORDER — SODIUM CHLORIDE 0.9 % IV SOLN
INTRAVENOUS | Status: DC
  Administered 2014-07-03: 19:00:00 via INTRAVENOUS

## 2014-07-03 MED ORDER — LEVALBUTEROL HCL 0.63 MG/3ML IN NEBU: 0.6300 mg | INHALATION_SOLUTION | Freq: Four times a day (QID) | RESPIRATORY_TRACT | Status: DC | PRN

## 2014-07-03 MED ORDER — DEXTROSE 50 % IV SOLN
50.0000 mL | Freq: Once | INTRAVENOUS | Status: AC
Start: 2014-07-03 — End: 2014-07-03
  Administered 2014-07-03: 50 mL via INTRAVENOUS
  Filled 2014-07-03: qty 50

## 2014-07-03 MED ORDER — ASPIRIN 81 MG PO CHEW
324.0000 mg | CHEWABLE_TABLET | Freq: Once | ORAL | Status: AC
  Administered 2014-07-03: 324 mg via ORAL
  Filled 2014-07-03: qty 4

## 2014-07-03 MED ORDER — DEXTROSE 50 % IV SOLN: 25.0000 mL | INTRAVENOUS | Status: DC | PRN

## 2014-07-03 MED ORDER — INSULIN REGULAR BOLUS VIA INFUSION
0.0000 [IU] | Freq: Three times a day (TID) | INTRAVENOUS | Status: DC
  Filled 2014-07-03: qty 10

## 2014-07-03 MED ORDER — ACETAMINOPHEN 650 MG RE SUPP: 650.0000 mg | Freq: Four times a day (QID) | RECTAL | Status: DC | PRN

## 2014-07-03 MED ORDER — ATORVASTATIN CALCIUM 40 MG PO TABS
40.0000 mg | ORAL_TABLET | Freq: Every day | ORAL | Status: DC
  Administered 2014-07-04: 40 mg via ORAL
  Filled 2014-07-03: qty 1

## 2014-07-03 MED ORDER — LISINOPRIL 5 MG PO TABS
5.0000 mg | ORAL_TABLET | Freq: Every day | ORAL | Status: DC
  Administered 2014-07-04 – 2014-07-05 (×2): 5 mg via ORAL
  Filled 2014-07-03 (×2): qty 1

## 2014-07-03 MED ORDER — DEXTROSE 50 % IV SOLN: 50.0000 mL | Freq: Once | INTRAVENOUS | Status: AC | PRN

## 2014-07-03 MED ORDER — MORPHINE SULFATE 2 MG/ML IJ SOLN: 1.0000 mg | INTRAMUSCULAR | Status: DC | PRN

## 2014-07-03 MED ORDER — DULOXETINE HCL 20 MG PO CPEP
40.0000 mg | ORAL_CAPSULE | Freq: Every day | ORAL | Status: DC
  Administered 2014-07-04 – 2014-07-05 (×2): 40 mg via ORAL
  Filled 2014-07-03 (×3): qty 2

## 2014-07-03 MED ORDER — HEPARIN BOLUS VIA INFUSION
4000.0000 [IU] | Freq: Once | INTRAVENOUS | Status: AC
  Administered 2014-07-03: 4000 [IU] via INTRAVENOUS
  Filled 2014-07-03: qty 4000

## 2014-07-03 MED ORDER — HEPARIN (PORCINE) IN NACL 100-0.45 UNIT/ML-% IJ SOLN
1000.0000 [IU]/h | INTRAMUSCULAR | Status: DC
  Administered 2014-07-03: 900 [IU]/h via INTRAVENOUS
  Administered 2014-07-04: 1000 [IU]/h via INTRAVENOUS
  Filled 2014-07-03 (×2): qty 250

## 2014-07-03 MED ORDER — SODIUM CHLORIDE 0.9 % IV SOLN
INTRAVENOUS | Status: DC
  Administered 2014-07-03: 19:00:00 via INTRAVENOUS
  Filled 2014-07-03: qty 2.5

## 2014-07-03 MED ORDER — DEXTROSE 50 % IV SOLN: 25.0000 mL | Freq: Once | INTRAVENOUS | Status: AC | PRN

## 2014-07-03 MED ORDER — ONDANSETRON HCL 4 MG/2ML IJ SOLN: 4.0000 mg | Freq: Four times a day (QID) | INTRAMUSCULAR | Status: DC | PRN

## 2014-07-03 MED ORDER — SODIUM CHLORIDE 0.9 % IJ SOLN
3.0000 mL | Freq: Two times a day (BID) | INTRAMUSCULAR | Status: DC
  Administered 2014-07-04 – 2014-07-05 (×3): 3 mL via INTRAVENOUS

## 2014-07-03 MED ORDER — ACETAMINOPHEN 325 MG PO TABS
650.0000 mg | ORAL_TABLET | Freq: Four times a day (QID) | ORAL | Status: DC | PRN
  Administered 2014-07-05: 650 mg via ORAL
  Filled 2014-07-03: qty 2

## 2014-07-03 MED ORDER — INSULIN ASPART 100 UNIT/ML ~~LOC~~ SOLN
0.0000 [IU] | Freq: Three times a day (TID) | SUBCUTANEOUS | Status: DC
  Administered 2014-07-04: 11 [IU] via SUBCUTANEOUS
  Administered 2014-07-04: 3 [IU] via SUBCUTANEOUS
  Administered 2014-07-05: 15 [IU] via SUBCUTANEOUS

## 2014-07-03 NOTE — ED Notes (Signed)
Pt transported to xray 

## 2014-07-03 NOTE — ED Notes (Signed)
Pt still continues to refuse second IV access by ED staff. Orders have been placed for IV team consult. Unable to start insulin drip until second IV is placed due to med incompatibility with heparin.

## 2014-07-03 NOTE — ED Notes (Signed)
Pt states he does not want [place] to insert second IV at this time. Pt states he will let us know if he changes his mind.

## 2014-07-03 NOTE — ED Notes (Signed)
Contacted lab about wait time on results.  

## 2014-07-03 NOTE — ED Notes (Signed)
CRITICAL VALUE ALERT  Critical value received:  K 7.1 and Troponin 4.98   Date of notification:  07/03/2014  Time of notification:  1626  Critical value read back: Yes  Nurse who received alert: Merleen Milliner  MD notified :  Md Gwendolyn Grant

## 2014-07-03 NOTE — Progress Notes (Signed)
ANTICOAGULATION CONSULT NOTE - Initial Consult  Pharmacy Consult for Heparin  Indication: chest pain/ACS  No Known Allergies  Patient Measurements: Height: 5' 6.14" (168 cm) Weight: 171 lb 15.3 oz (78 kg) (From 06/04/14) IBW/kg (Calculated) : 64.13 Heparin Dosing Weight: 78 kg  Vital Signs: Temp: 98.3 F (36.8 C) (11/03 1500) Temp Source: Oral (11/03 1500) BP: 101/56 mmHg (11/03 1547) Pulse Rate: 73 (11/03 1547)  Labs:  Recent Labs  07/03/14 1453  HGB 13.8  HCT 41.6  PLT 357  CREATININE 1.04  TROPONINI 4.98*    Estimated Creatinine Clearance: 85.6 mL/min (by C-G formula based on Cr of 1.04).   Medical History: Past Medical History  Diagnosis Date  . Diabetes mellitus type 1   . Hyperlipidemia   . Depression   . Umbilical hernia   . Hypertension   . Glaucoma   . CHF (congestive heart failure)   . Heart attack 12/16/11  . Hypothyroidism   . Hypoglycemia, unspecified   . Disorder of bone and cartilage, unspecified   . Acute posthemorrhagic anemia   . Anxiety state, unspecified   . Chronic pain syndrome   . Acute respiratory failure   . Dysphagia, oral phase   . Stress fracture of tibia or fibula   . Syncope and collapse   . Coronary artery disease 07/19/2012  . NSTEMI (non-ST elevated myocardial infarction) 12/19/2011  . Ischemic cardiomyopathy   . Narcotic dependency, continuous     Medications:   (Not in a hospital admission)  Assessment: 20 YOM who presented with shortness of breath and hypoxia. His initial troponin is elevated at 4.98. Pharmacy consulted to start heparin infusion for ACS. H/H and Plt wnl. Pt is not on any anticoagulation prior to admission  Goal of Therapy:  Heparin level 0.3-0.7 units/ml Monitor platelets by anticoagulation protocol: Yes   Plan:  -Heparin bolus of 4000 units followed by infusion at 900 units/hr -F/u 6 hr HL -Monitor daily HL, CBC and s/s of bleeding   Vinnie Level, PharmD.  Clinical Pharmacist Pager  [phone]

## 2014-07-03 NOTE — ED Notes (Signed)
PT REFUSED BLOOD WORK  

## 2014-07-03 NOTE — ED Notes (Signed)
Cardiology at bedside.

## 2014-07-03 NOTE — ED Notes (Signed)
Gave pt saltines and a diet ginger ale with MD Gwendolyn Grant approval.

## 2014-07-03 NOTE — ED Notes (Signed)
Cardiology Hilty at bedside.

## 2014-07-03 NOTE — ED Notes (Signed)
Attempted report x1. 

## 2014-07-03 NOTE — ED Notes (Signed)
CBG 389 

## 2014-07-03 NOTE — H&P (Signed)
[facility] History and Physical  Vivi FernsKevin J Dickens ZOX:[id number] DOB: 04-16-1966 DOA: 07/03/2014  Referring physician:  PCP: Oneal GroutPANDEY, MAHIMA, MD   Chief Complaint: Shortness of Breath HPI:  48 year old male with a history of insulin-dependent diabetes presents to the ED with a chief complaint of shortness of breath. Patient started developing symptoms 3 days ago, with acute onset of dyspnea, orthopnea. Patient presented to the ED today and was found to be hypoxic. He was found to have decreased air movement. Symptoms were improved in the ER for the help of IV Lasix. Labs were normal with troponin of 4.98, potassium was 7.1, glucose of greater than 500. Cardiology was consulted.during the time of this interview the patient was questioning me  whether smoking cocaine and marijuana can induce the symptoms. Patient refused to a UDS testing in the ED.  He has a history of severe diffuse three-vessel coronary artery disease and had a myocardial infarction in 2013. Cardiac catheter multivessel coronary artery disease. He was recommended CABG. Repeat heart catheter on patient in June 2015 also showed severe three-vessel coronary artery disease with an ejection fraction of 35%.He was evaluated by Dr. Cornelius Moraswen for cardiac surgery and was leaning toward surgery at the time but was not able to make a final decision. He was supposed to follow-up with Dr. Cornelius Moraswen but it does not appear that he made that follow-up appointment.    Review of Systems: negative for the following  Constitutional: Denies fever, chills, diaphoresis, appetite change and fatigue.  HEENT: Denies photophobia, eye pain, redness, hearing loss, ear pain, congestion, sore throat, rhinorrhea, sneezing, mouth sores, trouble swallowing, neck pain, neck stiffness and tinnitus.  Respiratory: Positive for shortness of breath. Negative for cough.  Cardiovascular: Negative for chest pain and leg swelling.  Gastrointestinal: Denies nausea, vomiting,  abdominal pain, diarrhea, constipation, blood in stool and abdominal distention.  Genitourinary: Denies dysuria, urgency, frequency, hematuria, flank pain and difficulty urinating.  Musculoskeletal: Denies myalgias, back pain, joint swelling, arthralgias and gait problem.  Skin: Denies pallor, rash and wound.  Neurological: Denies dizziness, seizures, syncope, weakness, light-headedness, numbness and headaches.  Hematological: Denies adenopathy. Easy bruising, personal or family bleeding history  Psychiatric/Behavioral: Denies suicidal ideation, mood changes, confusion, nervousness, sleep disturbance and agitation       Past Medical History  Diagnosis Date  . Diabetes mellitus type 1   . Hyperlipidemia   . Depression   . Umbilical hernia   . Hypertension   . Glaucoma   . CHF (congestive heart failure)   . Heart attack 12/16/11  . Hypothyroidism   . Hypoglycemia, unspecified   . Disorder of bone and cartilage, unspecified   . Acute posthemorrhagic anemia   . Anxiety state, unspecified   . Chronic pain syndrome   . Acute respiratory failure   . Dysphagia, oral phase   . Stress fracture of tibia or fibula   . Syncope and collapse   . Coronary artery disease 07/19/2012  . NSTEMI (non-ST elevated myocardial infarction) 12/19/2011  . Ischemic cardiomyopathy   . Narcotic dependency, continuous      Past Surgical History  Procedure Laterality Date  . Appendectomy      open  . Tibia im nail insertion  12/18/2011    Procedure: INTRAMEDULLARY (IM) NAIL TIBIAL;  Surgeon: Eugenia Mcalpineobert Collins, MD;  Location: [facility]S;  Service: Orthopedics;  Laterality: Bilateral;  . Insertion of dialysis catheter  12/31/2011    Procedure: INSERTION OF DIALYSIS CATHETER;  Surgeon: Chuck Hinthristopher S Dickson, MD;  [facility];  Service: Vascular;  Laterality: N/A;  . Tonsillectomy  1980  . Fracture surgery  2012    wrist  . Fracture surgery  2013    bilateral tibia      Social History:  reports that he  has been smoking Cigarettes.  He has been smoking about 1.00 pack per day. He has never used smokeless tobacco. He reports that he does not drink alcohol or use illicit drugs.    No Known Allergies  Family History  Problem Relation Age of Onset  . Heart disease Father      Prior to Admission medications   Medication Sig Start Date End Date Taking? Authorizing Provider  aspirin EC 81 MG tablet Take 81 mg by mouth daily.   Yes Historical Provider, MD  atorvastatin (LIPITOR) 40 MG tablet Take 40 mg by mouth daily.   Yes Historical Provider, MD  atorvastatin (LIPITOR) 40 MG tablet take 1 tablet by mouth once daily 05/21/14  Yes Tiffany L Reed, DO  ferrous sulfate 325 (65 FE) MG tablet Take 325 mg by mouth daily with breakfast.   Yes Historical Provider, MD  Insulin Glargine (LANTUS) 100 UNIT/ML Solostar Pen Inject 15 Units into the skin every morning. 07/02/14  Yes Romero Belling, MD  insulin regular (NOVOLIN R,HUMULIN R) 100 units/mL injection Inject 2 units 4 times a daily if needed for blood sugar over 250 04/30/14  Yes Romero Belling, MD  levothyroxine (SYNTHROID, LEVOTHROID) 125 MCG tablet Take 1 tablet (125 mcg total) by mouth daily before breakfast. 09/04/13  Yes Romero Belling, MD  lisinopril (PRINIVIL,ZESTRIL) 40 MG tablet take 1/2 tablet by mouth once daily 05/02/14  Yes Mahima Pandey, MD  lisinopril (PRINIVIL,ZESTRIL) 40 MG tablet Take 25 mg by mouth daily.   Yes Historical Provider, MD  LORazepam (ATIVAN) 2 MG tablet Take 1 tablet (2 mg) twice daily, may take an additional tablet during the day for anxiety if needed 03/19/14  Yes Tiffany L Reed, DO  metoprolol tartrate (LOPRESSOR) 25 MG tablet APPOINTMENT OVERDUE 1/2 tablet twice a day Patient taking differently: Take 12.5 mg by mouth 2 (two) times daily.  06/20/14  Yes Mahima Glade Lloyd, MD  Multiple Vitamin (MULTIVITAMIN WITH MINERALS) TABS tablet Take 1 tablet by mouth daily. Diabetes daily pak from [employer]   Yes Historical Provider, MD  oxyCODONE  (ROXICODONE) 15 MG immediate release tablet Take 15-30 mg by mouth See admin instructions. Take by mouth 5 times daily as needed for pain   Yes Historical Provider, MD  ACCU-CHEK AVIVA PLUS test strip TEST 6 TIMES A DAY AS DIRECTED. 05/04/13   Romero Belling, MD  ARIPiprazole (ABILIFY) 5 MG tablet Take 1 tablet (5 mg total) by mouth 2 (two) times daily. 04/06/14   Alison Murray, MD  DULoxetine 40 MG CPEP Take 40 mg by mouth daily. 04/06/14   Alison Murray, MD  glucagon (GLUCAGON EMERGENCY) 1 MG injection Inject 1 mg into the muscle once as needed (for low blood sugar). 03/19/14   Romero Belling, MD  glucose blood test strip TEST BLOOD SUGAR 6 TIMES A DAY AS INSTRUCTED BY DR Everardo All 05/04/13   Romero Belling, MD  INSULIN SYRINGE .5CC/29G 29G X 1/2" 0.5 ML MISC 1 Syringe by Does not apply route daily. 05/18/13   Romero Belling, MD  Insulin Syringe-Needle U-100 (B-D INS SYRINGE 0.5CC/30GX1/2") 30G X 1/2" 0.5 ML MISC Use 3-4 times per day 06/15/14   Romero Belling, MD  Oxycodone HCl 20 MG TABS Take 1 tablet (20  mg total) by mouth 4 (four) times daily as needed. 04/06/14   Alison Murray, MD     Physical Exam: Filed Vitals:   07/03/14 1715 07/03/14 1730 07/03/14 1750 07/03/14 1825  BP: 95/44 101/38  105/58  Pulse: 81 78  86  Temp:      TempSrc:      Resp: 22 27  17   Height:      Weight:      SpO2: 97% 97% 93% 93%     Constitutional: Vital signs reviewed. Patient is a well-developed actually morbidly obese, appears to be diaphoretic Alert and oriented x3.  Head: Normocephalic and atraumatic  Ear: TM normal bilaterally  Mouth: no erythema or exudates, MMM  Eyes: PERRL, EOMI, conjunctivae normal, No scleral icterus.  Neck: Supple, Trachea midline normal ROM, No JVD, mass, thyromegaly, or carotid bruit present.  Cardiovascular: RRR, S1 normal, S2 normal, no MRG, pulses symmetric and intact bilaterally  Pulmonary/Chest: CTAB, no wheezes, rales, or rhonchi  Abdominal: Soft. Non-tender, non-distended, bowel sounds are  normal, no masses, organomegaly, or guarding present.  GU: no CVA tenderness Musculoskeletal: No joint deformities, erythema, or stiffness, ROM full and no nontender Ext: no edema and no cyanosis, pulses palpable bilaterally (DP and PT)  Hematology: no cervical, inginal, or axillary adenopathy.  Neurological: A&O x3, Strenght is normal and symmetric bilaterally, cranial nerve II-XII are grossly intact, no focal motor deficit, sensory intact to light touch bilaterally.  Skin: Warm, dry and intact. No rash, cyanosis, or clubbing.  Psychiatric: Normal mood and affect. speech and behavior is normal. Judgment and thought content normal. Cognition and memory are normal.       Labs on Admission:    Basic Metabolic Panel:  Recent Labs Lab 07/03/14 1453  NA 131*  K 7.1*  CL 93*  CO2 23  GLUCOSE 513*  BUN 22  CREATININE 1.04  CALCIUM 9.4   Liver Function Tests:  Recent Labs Lab 07/03/14 1453  AST 55*  ALT 39  ALKPHOS 101  BILITOT 0.8  PROT 7.7  ALBUMIN 4.1    Recent Labs Lab 07/03/14 1453  LIPASE 10*   No results for input(s): AMMONIA in the last 168 hours. CBC:  Recent Labs Lab 07/03/14 1453  WBC 14.0*  NEUTROABS 10.9*  HGB 13.8  HCT 41.6  MCV 92.0  PLT 357   Cardiac Enzymes:  Recent Labs Lab 07/03/14 1453  TROPONINI 4.98*    BNP (last 3 results)  Recent Labs  07/03/14 1453  PROBNP 10357.0*      CBG: No results for input(s): GLUCAP in the last 168 hours.  Radiological Exams on Admission: Dg Chest 2 View  07/03/2014   CLINICAL DATA:  Cough and shortness of breath with history of coronary artery disease. CHF and renal insufficiency  EXAM: CHEST  2 VIEW  COMPARISON:  Portable chest x-ray of March 31, 2014  FINDINGS: The lungs are adequately inflated. There is no focal infiltrate. The interstitial markings are increased bilaterally. The pulmonary vascularity is mildly engorged. The cardiac silhouette is enlarged. There is no pleural effusion or  pneumothorax. here is stable anterior wedge compression of the body of T11.  IMPRESSION: Congestive heart failure with mild pulmonary interstitial edema. There is no pleural effusion.   Electronically Signed   By: David  [place]   On: 07/03/2014 15:47    EKG: Independently reviewed.  *  Assessment/Plan Principal Problem:   NSTEMI (non-ST elevated myocardial infarction) Active Problems:   Type 1 diabetes mellitus  Acute on chronic systolic congestive heart failure, NYHA class 4   Diabetic hyperosmolar non-ketotic state   CAD, multiple vessel   Hyperkalemia   NSTEMI  Evaluated by cardiology in the ED Admit to step down unit Patient placed on a heparin drip Cardiology recommends CABG, possibility of repeat cardiac catheterization Repeat 2-D echo Refusing UDS, patient ambiguous about whether he has been using cocaine and marijuana  Hyperosmolar nonketotic syndrome Continue glucose stabilizer drip Anion gap of 15 Accu-Cheks per protocol Check hemoglobin A1c   Hyperkalemia Status post Kayexalate D50 and IV insulin We'll repeat BMP tonight  Hypothyroidism Continue Synthroid and check TSH    Code Status:   full Family Communication: bedside Disposition Plan: admit   Time spent: 70 mins   [facility] Pager [phone]  If 7PM-7AM, please contact night-coverage [email or web address] Password [facility] 07/03/2014, 6:28 PM

## 2014-07-03 NOTE — Consult Note (Signed)
CONSULTATION NOTE  Reason for Consult: NSTEMi, CHF  Requesting Physician: Dr. Mingo Amber  Cardiologist: Dr. Daneen Schick III  HPI: This is a 48 y.o. male with a past medical history significant for severe diffuse three-vessel coronary artery disease. He suffered a myocardial infarction in 2013. At that time angiography demonstrated multivessel coronary disease and the patient refused surgery which was recommended by Drs. [place] and [place]. He also has poorly controlled diabetes, chronic systolic heart failure, ongoing tobacco and substance abuse as well as hypertension and anxiety/delirium. He underwent repeat heart catheterization in June 2015 after he developed progressive symptoms of chest pain and shortness of breath. He had an intermediate to high risk myocardial perfusion study. This demonstrated severe three-vessel coronary disease in the proximal to mid LAD, first obtuse marginal and distal right coronary artery. EF was noted be 35%. Coronary artery bypass grafting was recommended. He was evaluated by Dr. Roxy Manns for cardiac surgery and was leaning toward surgery at the time but was not able to make a final decision. He was supposed to follow-up with Dr. Roxy Manns but it does not appear that he made that follow-up appointment.  He now presents with 3 days of increasing shortness of breath and orthopnea. He was noted to be hypoxic into the 80s. He has had a favorable response to Lasix. Laboratory work resulted in elevated troponin of 4.98. However he has marked electrolyte abnormalities including sodium of 131 and chloride of 93, potassium of 7.1, glucose of 513 with an anion gap of 15. Elevated white blood cell count of 14,000 with a proBNP of 10,357. There is neutrophil predominance on the differential. Urine glucose is greater than 1000 with positive urine ketones. Chest x-ray is consistent with congestive heart failure. EKG demonstrates borderline ST elevation inferiorly however significantly  lead wander is apparent.  PMHx:  Past Medical History  Diagnosis Date  . Diabetes mellitus type 1   . Hyperlipidemia   . Depression   . Umbilical hernia   . Hypertension   . Glaucoma   . CHF (congestive heart failure)   . Heart attack 12/16/11  . Hypothyroidism   . Hypoglycemia, unspecified   . Disorder of bone and cartilage, unspecified   . Acute posthemorrhagic anemia   . Anxiety state, unspecified   . Chronic pain syndrome   . Acute respiratory failure   . Dysphagia, oral phase   . Stress fracture of tibia or fibula   . Syncope and collapse   . Coronary artery disease 07/19/2012  . NSTEMI (non-ST elevated myocardial infarction) 12/19/2011  . Ischemic cardiomyopathy   . Narcotic dependency, continuous    Past Surgical History  Procedure Laterality Date  . Appendectomy      open  . Tibia im nail insertion  12/18/2011    Procedure: INTRAMEDULLARY (IM) NAIL TIBIAL;  Surgeon: Sydnee Cabal, MD;  Location: [facility];  Service: Orthopedics;  Laterality: Bilateral;  . Insertion of dialysis catheter  12/31/2011    Procedure: INSERTION OF DIALYSIS CATHETER;  Surgeon: Angelia Mould, MD;  Location: [place];  Service: Vascular;  Laterality: N/A;  . Tonsillectomy  1980  . Fracture surgery  2012    wrist  . Fracture surgery  2013    bilateral tibia    FAMHx: Family History  Problem Relation Age of Onset  . Heart disease Father     SOCHx:  reports that he has been smoking Cigarettes.  He has been smoking about 1.00 pack per day. He has never used  smokeless tobacco. He reports that he does not drink alcohol or use illicit drugs.  ALLERGIES: No Known Allergies  ROS: Review of systems not obtained due to patient factors.  HOME MEDICATIONS:   Medication List    ASK your doctor about these medications        ACCU-CHEK AVIVA PLUS test strip  Generic drug:  glucose blood  TEST 6 TIMES A DAY AS DIRECTED.     glucose blood test strip  TEST BLOOD SUGAR 6 TIMES A DAY AS  INSTRUCTED BY DR Loanne Drilling     ARIPiprazole 5 MG tablet  Commonly known as:  ABILIFY  Take 1 tablet (5 mg total) by mouth 2 (two) times daily.     aspirin EC 81 MG tablet  Take 81 mg by mouth daily.     atorvastatin 40 MG tablet  Commonly known as:  LIPITOR  Take 40 mg by mouth daily.     atorvastatin 40 MG tablet  Commonly known as:  LIPITOR  take 1 tablet by mouth once daily     DULoxetine HCl 40 MG Cpep  Take 40 mg by mouth daily.     ferrous sulfate 325 (65 FE) MG tablet  Take 325 mg by mouth daily with breakfast.     glucagon 1 MG injection  Commonly known as:  GLUCAGON EMERGENCY  Inject 1 mg into the muscle once as needed (for low blood sugar).     Insulin Glargine 100 UNIT/ML Solostar Pen  Commonly known as:  LANTUS  Inject 15 Units into the skin every morning.     insulin regular 100 units/mL injection  Commonly known as:  NOVOLIN R,HUMULIN R  Inject 2 units 4 times a daily if needed for blood sugar over 250     INSULIN SYRINGE .5CC/29G 29G X 1/2" 0.5 ML Misc  1 Syringe by Does not apply route daily.     Insulin Syringe-Needle U-100 30G X 1/2" 0.5 ML Misc  Commonly known as:  B-D INS SYRINGE 0.5CC/30GX1/2"  Use 3-4 times per day     levothyroxine 125 MCG tablet  Commonly known as:  SYNTHROID, LEVOTHROID  Take 1 tablet (125 mcg total) by mouth daily before breakfast.     lisinopril 40 MG tablet  Commonly known as:  PRINIVIL,ZESTRIL  Take 25 mg by mouth daily.     lisinopril 40 MG tablet  Commonly known as:  PRINIVIL,ZESTRIL  take 1/2 tablet by mouth once daily     LORazepam 2 MG tablet  Commonly known as:  ATIVAN  Take 1 tablet (2 mg) twice daily, may take an additional tablet during the day for anxiety if needed     metoprolol tartrate 25 MG tablet  Commonly known as:  LOPRESSOR  APPOINTMENT OVERDUE 1/2 tablet twice a day     multivitamin with minerals Tabs tablet  Take 1 tablet by mouth daily. Diabetes daily pak from [employer]     oxyCODONE 15 MG  immediate release tablet  Commonly known as:  ROXICODONE  Take 15-30 mg by mouth See admin instructions. Take by mouth 5 times daily as needed for pain     Oxycodone HCl 20 MG Tabs  Take 1 tablet (20 mg total) by mouth 4 (four) times daily as needed.       HOSPITAL MEDICATIONS: Scheduled: . furosemide  40 mg Intravenous Once    VITALS: Blood pressure 101/38, pulse 78, temperature 98.3 F (36.8 C), temperature source Oral, resp. rate 27, height 5' 6.14" (1.68  m), weight 171 lb 15.3 oz (78 kg), SpO2 97 %.  PHYSICAL EXAM: General appearance: alert, flushed, mild distress and pale Neck: JVD - 5 cm above sternal notch and no carotid bruit Lungs: diminished breath sounds bilaterally, rales bibasilar and wheezes LUL Heart: regularly irregular rhythm, S1, S2 normal and S3 present Abdomen: soft, non-tender; bowel sounds normal; no masses,  no organomegaly Extremities: extremities normal, atraumatic, no cyanosis or edema Pulses: 2+ and symmetric Skin: pale, diaphoretic Neurologic: Mental status: alert and oriented Psych: Anxiety, depression, tearful  LABS: Results for orders placed or performed during the hospital encounter of 07/03/14 (from the past 48 hour(s))  Comprehensive metabolic panel     Status: Abnormal   Collection Time: 07/03/14  2:53 PM  Result Value Ref Range   Sodium 131 (L) 137 - 147 mEq/L   Potassium 7.1 (HH) 3.7 - 5.3 mEq/L    Comment: CRITICAL RESULT CALLED TO, READ BACK BY AND VERIFIED WITH: C KEMP,RN 1624 07/03/14 D BRADLEY    Chloride 93 (L) 96 - 112 mEq/L   CO2 23 19 - 32 mEq/L   Glucose, Bld 513 (H) 70 - 99 mg/dL   BUN 22 6 - 23 mg/dL   Creatinine, Ser 1.04 0.50 - 1.35 mg/dL   Calcium 9.4 8.4 - 10.5 mg/dL   Total Protein 7.7 6.0 - 8.3 g/dL   Albumin 4.1 3.5 - 5.2 g/dL   AST 55 (H) 0 - 37 U/L   ALT 39 0 - 53 U/L   Alkaline Phosphatase 101 39 - 117 U/L   Total Bilirubin 0.8 0.3 - 1.2 mg/dL   GFR calc non Af Amer 83 (L) >90 mL/min   GFR calc Af Amer >90  >90 mL/min    Comment: (NOTE) The eGFR has been calculated using the CKD EPI equation. This calculation has not been validated in all clinical situations. eGFR's persistently <90 mL/min signify possible Chronic Kidney Disease.    Anion gap 15 5 - 15  CBC with Differential     Status: Abnormal   Collection Time: 07/03/14  2:53 PM  Result Value Ref Range   WBC 14.0 (H) 4.0 - 10.5 K/uL   RBC 4.52 4.22 - 5.81 MIL/uL   Hemoglobin 13.8 13.0 - 17.0 g/dL   HCT 41.6 39.0 - 52.0 %   MCV 92.0 78.0 - 100.0 fL   MCH 30.5 26.0 - 34.0 pg   MCHC 33.2 30.0 - 36.0 g/dL   RDW 14.4 11.5 - 15.5 %   Platelets 357 150 - 400 K/uL   Neutrophils Relative % 78 (H) 43 - 77 %   Neutro Abs 10.9 (H) 1.7 - 7.7 K/uL   Lymphocytes Relative 14 12 - 46 %   Lymphs Abs 2.0 0.7 - 4.0 K/uL   Monocytes Relative 7 3 - 12 %   Monocytes Absolute 1.0 0.1 - 1.0 K/uL   Eosinophils Relative 1 0 - 5 %   Eosinophils Absolute 0.2 0.0 - 0.7 K/uL   Basophils Relative 0 0 - 1 %   Basophils Absolute 0.0 0.0 - 0.1 K/uL  Troponin I     Status: Abnormal   Collection Time: 07/03/14  2:53 PM  Result Value Ref Range   Troponin I 4.98 (HH) <0.30 ng/mL    Comment:        Due to the release kinetics of cTnI, a negative result within the first hours of the onset of symptoms does not rule out myocardial infarction with certainty. If myocardial infarction is  still suspected, repeat the test at appropriate intervals. CRITICAL RESULT CALLED TO, READ BACK BY AND VERIFIED WITH: C KEMP,RN 1624 07/03/14 D BRADLEY   Pro b natriuretic peptide (BNP)     Status: Abnormal   Collection Time: 07/03/14  2:53 PM  Result Value Ref Range   Pro B Natriuretic peptide (BNP) 10357.0 (H) 0 - 125 pg/mL  Lipase, blood     Status: Abnormal   Collection Time: 07/03/14  2:53 PM  Result Value Ref Range   Lipase 10 (L) 11 - 59 U/L  Urinalysis, Routine w reflex microscopic     Status: Abnormal   Collection Time: 07/03/14  4:15 PM  Result Value Ref Range    Color, Urine YELLOW YELLOW   APPearance CLEAR CLEAR   Specific Gravity, Urine 1.030 1.005 - 1.030   pH 6.5 5.0 - 8.0   Glucose, UA >1000 (A) NEGATIVE mg/dL   Hgb urine dipstick NEGATIVE NEGATIVE   Bilirubin Urine NEGATIVE NEGATIVE   Ketones, ur 15 (A) NEGATIVE mg/dL   Protein, ur NEGATIVE NEGATIVE mg/dL   Urobilinogen, UA 0.2 0.0 - 1.0 mg/dL   Nitrite NEGATIVE NEGATIVE   Leukocytes, UA NEGATIVE NEGATIVE  Urine microscopic-add on     Status: None   Collection Time: 07/03/14  4:15 PM  Result Value Ref Range   Squamous Epithelial / LPF RARE RARE   WBC, UA 0-2 <3 WBC/hpf   RBC / HPF 0-2 <3 RBC/hpf   Bacteria, UA RARE RARE    IMAGING: Dg Chest 2 View  07/03/2014   CLINICAL DATA:  Cough and shortness of breath with history of coronary artery disease. CHF and renal insufficiency  EXAM: CHEST  2 VIEW  COMPARISON:  Portable chest x-ray of March 31, 2014  FINDINGS: The lungs are adequately inflated. There is no focal infiltrate. The interstitial markings are increased bilaterally. The pulmonary vascularity is mildly engorged. The cardiac silhouette is enlarged. There is no pleural effusion or pneumothorax. here is stable anterior wedge compression of the body of T11.  IMPRESSION: Congestive heart failure with mild pulmonary interstitial edema. There is no pleural effusion.   Electronically Signed   By: David  Martinique   On: 07/03/2014 15:47    HOSPITAL DIAGNOSES: Principal Problem:   NSTEMI (non-ST elevated myocardial infarction) Active Problems:   Type 1 diabetes mellitus   Acute on chronic systolic congestive heart failure, NYHA class 4   Diabetic hyperosmolar non-ketotic state   CAD, multiple vessel   IMPRESSION: 1. Known multivessel CAD - surgical revascularization has been recommended 2. Acute systolic congestive heart failure, NYHA Class IV symptoms, EF ~35% in past 3. NSTEMI secondary to known CAD 4. Probable DKA with elevated glucose, urine ketones, low  NA/Cl 5. Hyperkalemia 6. Anxiety/depression  RECOMMENDATION: 1. Multivessel coronary artery disease: Mr. Portela has known multivessel coronary artery disease in his experience a non-ST elevation MI. He does have some EKG abnormalities which may be related to hyperkalemia but is not having current chest pain therefore I do not believe this is ST elevation MI. I would recommend heparinization and medical therapy at this time. Ultimately coronary artery bypass grafting is again recommended. He may need repeat catheterization as is possible he could've infarcted one of the vessels that may or may not need to be bypassed. He is still uncertain whether he wants coronary bypass grafting due to significant anxiety and depression. He understands that he may have a fatal MI in the future should he continue to neglect revascularization. 2. Acute  systolic congestive heart failure: presentation is consistent with acute systolic congestive heart failure. He has a known EF of 35% with new non-ST elevation MI, is expected that his EF is somewhat lower. I would recommend IV Lasix now and we may need to continue this in addition to IV fluids as he is somewhat hyperosmolar. Plan recheck echocardiogram tomorrow. 3. Hyperosmolar nonketotic syndrome: he is hyperglycemic with blood sugars in the 500s, low sodium and chloride and elevated urine ketones, anion gap is 15. He also has significant hyperkalemia and has already been administered insulin, D50 and Kayexalate. He will need optimization of his diabetes prior to any possible surgical intervention. 4. Hyperkalemia: Already treated acutely. He will need close follow-up of this and most likely an insulin drip to help resolve his elevated glucose and electrolyte abnormalities. 5. Anxiety/depression: This is significant including his fear of being able to commit to bypass surgery. We may need to reconsult psychiatry for assistance.  We will follow along closely with you. Thanks  for consulting [place].  Time Spent Directly with Patient: 60 minutes  Pixie Casino, MD, [facility] Attending Cardiologist [facility]  , C 07/03/2014, 5:54 PM

## 2014-07-03 NOTE — ED Notes (Signed)
Pt from home, brought in by EMS. Pt c/o of SOB with exertion, lightheadedness, and nausea since Friday. Pt states it is worse when he lies flat. EMS states pt is tachypnic with rales in bilateral bases. Pt tripoding since EMS arrival. EMS placed pt on 4L O2, sats in low 90s. BP 110/80, HR 80, CBG 461. Pt denies CP, V/D. Pt continues to tripod upon arrival. RR 22 Placed on 4L O2. Sats 96%.

## 2014-07-03 NOTE — ED Provider Notes (Signed)
CSN: [id number]     Arrival date & time 07/03/14  1438 History   First MD Initiated Contact with Patient 07/03/14 1438     Chief Complaint  Patient presents with  . Shortness of Breath     (Consider location/radiation/quality/duration/timing/severity/associated sxs/prior Treatment) Patient is a 48 y.o. male presenting with shortness of breath. The history is provided by the patient.  Shortness of Breath Severity:  Moderate Onset quality:  Gradual Duration:  3 days Timing:  Constant Progression:  Unchanged Chronicity:  New Context: not URI   Relieved by:  Nothing Worsened by:  Nothing tried Associated symptoms: abdominal pain and vomiting   Associated symptoms: no chest pain, no cough and no fever     Past Medical History  Diagnosis Date  . Diabetes mellitus type 1   . Hyperlipidemia   . Depression   . Umbilical hernia   . Hypertension   . Glaucoma   . CHF (congestive heart failure)   . Heart attack 12/16/11  . Hypothyroidism   . Hypoglycemia, unspecified   . Disorder of bone and cartilage, unspecified   . Acute posthemorrhagic anemia   . Anxiety state, unspecified   . Chronic pain syndrome   . Acute respiratory failure   . Dysphagia, oral phase   . Stress fracture of tibia or fibula   . Syncope and collapse   . Coronary artery disease 07/19/2012  . NSTEMI (non-ST elevated myocardial infarction) 12/19/2011  . Ischemic cardiomyopathy   . Narcotic dependency, continuous    Past Surgical History  Procedure Laterality Date  . Appendectomy      open  . Tibia im nail insertion  12/18/2011    Procedure: INTRAMEDULLARY (IM) NAIL TIBIAL;  Surgeon: Eugenia Mcalpine, MD;  Location: [facility];  Service: Orthopedics;  Laterality: Bilateral;  . Insertion of dialysis catheter  12/31/2011    Procedure: INSERTION OF DIALYSIS CATHETER;  Surgeon: Chuck Hint, MD;  Location: [facility];  Service: Vascular;  Laterality: N/A;  . Tonsillectomy  1980  . Fracture surgery  2012    wrist   . Fracture surgery  2013    bilateral tibia   Family History  Problem Relation Age of Onset  . Heart disease Father    History  Substance Use Topics  . Smoking status: Current Some Day Smoker -- 1.00 packs/day    Types: Cigarettes  . Smokeless tobacco: Never Used  . Alcohol Use: No    Review of Systems  Constitutional: Negative for fever.  Respiratory: Positive for shortness of breath. Negative for cough.   Cardiovascular: Negative for chest pain and leg swelling.  Gastrointestinal: Positive for nausea, vomiting and abdominal pain.  All other systems reviewed and are negative.     Allergies  Review of patient's allergies indicates no known allergies.  Home Medications   Prior to Admission medications   Medication Sig Start Date End Date Taking? Authorizing Provider  ACCU-CHEK AVIVA PLUS test strip TEST 6 TIMES A DAY AS DIRECTED. 05/04/13   Romero Belling, MD  ARIPiprazole (ABILIFY) 5 MG tablet Take 1 tablet (5 mg total) by mouth 2 (two) times daily. 04/06/14   Alison Murray, MD  aspirin EC 81 MG tablet Take 81 mg by mouth daily.    Historical Provider, MD  atorvastatin (LIPITOR) 40 MG tablet Take 40 mg by mouth daily.    Historical Provider, MD  atorvastatin (LIPITOR) 40 MG tablet take 1 tablet by mouth once daily 05/21/14   Kermit Balo, DO  DULoxetine 40 MG CPEP Take 40 mg by mouth daily. 04/06/14   Alison MurrayAlma M Devine, MD  ferrous sulfate 325 (65 FE) MG tablet Take 325 mg by mouth daily with breakfast.    Historical Provider, MD  glucagon (GLUCAGON EMERGENCY) 1 MG injection Inject 1 mg into the muscle once as needed (for low blood sugar). 03/19/14   Romero BellingSean Ellison, MD  glucose blood test strip TEST BLOOD SUGAR 6 TIMES A DAY AS INSTRUCTED BY DR Everardo AllELLISON 05/04/13   Romero BellingSean Ellison, MD  Insulin Glargine (LANTUS) 100 UNIT/ML Solostar Pen Inject 15 Units into the skin every morning. 07/02/14   Romero BellingSean Ellison, MD  insulin regular (NOVOLIN R,HUMULIN R) 100 units/mL injection Inject 2 units 4 times a  daily if needed for blood sugar over 250 04/30/14   Romero BellingSean Ellison, MD  INSULIN SYRINGE .5CC/29G 29G X 1/2" 0.5 ML MISC 1 Syringe by Does not apply route daily. 05/18/13   Romero BellingSean Ellison, MD  Insulin Syringe-Needle U-100 (B-D INS SYRINGE 0.5CC/30GX1/2") 30G X 1/2" 0.5 ML MISC Use 3-4 times per day 06/15/14   Romero BellingSean Ellison, MD  levothyroxine (SYNTHROID, LEVOTHROID) 125 MCG tablet Take 1 tablet (125 mcg total) by mouth daily before breakfast. 09/04/13   Romero BellingSean Ellison, MD  lisinopril (PRINIVIL,ZESTRIL) 40 MG tablet take 1/2 tablet by mouth once daily 05/02/14   Oneal GroutMahima Pandey, MD  LORazepam (ATIVAN) 2 MG tablet Take 1 tablet (2 mg) twice daily, may take an additional tablet during the day for anxiety if needed 03/19/14   Tiffany L Reed, DO  metoprolol tartrate (LOPRESSOR) 25 MG tablet APPOINTMENT OVERDUE 1/2 tablet twice a day 06/20/14   Oneal GroutMahima Pandey, MD  Multiple Vitamin (MULTIVITAMIN WITH MINERALS) TABS tablet Take 1 tablet by mouth daily. Diabetes daily pak from [employer]    Historical Provider, MD  Oxycodone HCl 20 MG TABS Take 1 tablet (20 mg total) by mouth 4 (four) times daily as needed. 04/06/14   Alison MurrayAlma M Devine, MD   BP 96/62 mmHg  Pulse 72  Resp 17  SpO2 90% Physical Exam  Constitutional: He is oriented to person, place, and time. He appears well-developed and well-nourished. No distress.  HENT:  Head: Normocephalic and atraumatic.  Mouth/Throat: Oropharynx is clear and moist. No oropharyngeal exudate.  Eyes: EOM are normal. Pupils are equal, round, and reactive to light.  Neck: Normal range of motion. Neck supple.  Cardiovascular: Normal rate and regular rhythm.  Exam reveals no friction rub.   No murmur heard. Pulmonary/Chest: Effort normal and breath sounds normal. No respiratory distress. He has no wheezes. He has no rales.  Abdominal: Soft. He exhibits no distension. There is no tenderness. There is no rebound.  Musculoskeletal: Normal range of motion. He exhibits no edema.  Neurological: He is  alert and oriented to person, place, and time. No cranial nerve deficit. He exhibits normal muscle tone. Coordination normal.  Skin: No rash noted. He is not diaphoretic.  Nursing note and vitals reviewed.   ED Course  Procedures (including critical care time) Labs Review Labs Reviewed  COMPREHENSIVE METABOLIC PANEL  CBC WITH DIFFERENTIAL  TROPONIN I  PRO B NATRIURETIC PEPTIDE  LIPASE, BLOOD  URINALYSIS, ROUTINE W REFLEX MICROSCOPIC    Imaging Review No results found.   EKG Interpretation   Date/Time:  Tuesday July 03 2014 14:53:03 EST Ventricular Rate:  73 PR Interval:  151 QRS Duration: 80 QT Interval:  439 QTC Calculation: 484 R Axis:   67 Text Interpretation:  Sinus rhythm Anteroseptal infarct, age indeterminate  Abnormal T, consider ischemia, inferior leads Baseline wander in lead(s)  II III aVF Artifact in inferior leads Confirmed by Gwendolyn Grant  MD, Suresh Audi  ([id number]) on 07/03/2014 2:57:02 PM     CRITICAL CARE Performed by: Dagmar Hait   Total critical care time: 30 minutes  Critical care time was exclusive of separately billable procedures and treating other patients.  Critical care was necessary to treat or prevent imminent or life-threatening deterioration.  Critical care was time spent personally by me on the following activities: development of treatment plan with patient and/or surrogate as well as nursing, discussions with consultants, evaluation of patient's response to treatment, examination of patient, obtaining history from patient or surrogate, ordering and performing treatments and interventions, ordering and review of laboratory studies, ordering and review of radiographic studies, pulse oximetry and re-evaluation of patient's condition.  MDM   Final diagnoses:  NSTEMI (non-ST elevated myocardial infarction)  Shortness of breath  Hyperkalemia    48 year old male presents with shortness of breath for the past 3 days. Worse with lying  flat. Patient is hypoxic here down to 80 percentile talking to me. Decreased air movement diffusely, almost no air movement the bases with some occasional rales. Does not wear oxygen at home. Concern for possible CHF exacerbation. Labs checked. Will continue his oxygen at this time. We'll give Lasix. Labs returned with troponin of 4.98, K of 7.1. No hemolysis visualized. Repeat EKG without signs of STEMI, no peaked T waves. K temporized, heparin initiated. Cards will consult, medicine to admit.  Elwin Mocha, MD 07/03/14 [phone]

## 2014-07-03 NOTE — ED Notes (Signed)
Pt returned from xray

## 2014-07-04 ENCOUNTER — Other Ambulatory Visit: Payer: Self-pay | Admitting: *Deleted

## 2014-07-04 ENCOUNTER — Encounter (HOSPITAL_COMMUNITY)
Admission: EM | Disposition: A | Discharge status: Skilled Nursing Facility | Payer: Self-pay | Source: Home / Self Care | Attending: Thoracic Surgery (Cardiothoracic Vascular Surgery)

## 2014-07-04 DIAGNOSIS — I2510 Atherosclerotic heart disease of native coronary artery without angina pectoris: Secondary | ICD-10-CM

## 2014-07-04 DIAGNOSIS — I5023 Acute on chronic systolic (congestive) heart failure: Secondary | ICD-10-CM

## 2014-07-04 DIAGNOSIS — E1100 Type 2 diabetes mellitus with hyperosmolarity without nonketotic hyperglycemic-hyperosmolar coma (NKHHC): Secondary | ICD-10-CM

## 2014-07-04 HISTORY — PX: LEFT HEART CATHETERIZATION WITH CORONARY ANGIOGRAM: SHX5451

## 2014-07-04 LAB — COMPREHENSIVE METABOLIC PANEL
ALBUMIN: 3.5 g/dL (ref 3.5–5.2)
ALK PHOS: 87 U/L (ref 39–117)
ALT: 32 U/L (ref 0–53)
AST: 43 U/L — AB (ref 0–37)
Anion gap: 13 (ref 5–15)
BILIRUBIN TOTAL: 0.6 mg/dL (ref 0.3–1.2)
BUN: 22 mg/dL (ref 6–23)
CHLORIDE: 100 meq/L (ref 96–112)
CO2: 27 meq/L (ref 19–32)
Calcium: 9.4 mg/dL (ref 8.4–10.5)
Creatinine, Ser: 1.24 mg/dL (ref 0.50–1.35)
GFR calc Af Amer: 78 mL/min — ABNORMAL LOW (ref >90)
GFR, EST NON AFRICAN AMERICAN: 67 mL/min — AB (ref >90)
Glucose, Bld: 206 mg/dL — ABNORMAL HIGH (ref 70–99)
POTASSIUM: 4 meq/L (ref 3.7–5.3)
Sodium: 140 mEq/L (ref 137–147)
Total Protein: 6.5 g/dL (ref 6.0–8.3)

## 2014-07-04 LAB — LIPID PANEL
Cholesterol: 143 mg/dL (ref 0–200)
HDL: 58 mg/dL (ref >39)
LDL Cholesterol: 63 mg/dL (ref 0–99)
TRIGLYCERIDES: 112 mg/dL (ref <150)
Total CHOL/HDL Ratio: 2.5 RATIO
VLDL: 22 mg/dL (ref 0–40)

## 2014-07-04 LAB — CBC
HEMATOCRIT: 35.7 % — AB (ref 39.0–52.0)
HEMOGLOBIN: 11.8 g/dL — AB (ref 13.0–17.0)
MCH: 30 pg (ref 26.0–34.0)
MCHC: 33.1 g/dL (ref 30.0–36.0)
MCV: 90.8 fL (ref 78.0–100.0)
Platelets: 318 10*3/uL (ref 150–400)
RBC: 3.93 MIL/uL — ABNORMAL LOW (ref 4.22–5.81)
RDW: 14.4 % (ref 11.5–15.5)
WBC: 9.6 10*3/uL (ref 4.0–10.5)

## 2014-07-04 LAB — GLUCOSE, CAPILLARY
GLUCOSE-CAPILLARY: 180 mg/dL — AB (ref 70–99)
GLUCOSE-CAPILLARY: 295 mg/dL — AB (ref 70–99)
GLUCOSE-CAPILLARY: 309 mg/dL — AB (ref 70–99)
Glucose-Capillary: 125 mg/dL — ABNORMAL HIGH (ref 70–99)
Glucose-Capillary: 171 mg/dL — ABNORMAL HIGH (ref 70–99)
Glucose-Capillary: 199 mg/dL — ABNORMAL HIGH (ref 70–99)
Glucose-Capillary: 303 mg/dL — ABNORMAL HIGH (ref 70–99)

## 2014-07-04 LAB — HEMOGLOBIN A1C
Hgb A1c MFr Bld: 6.6 % — ABNORMAL HIGH (ref <5.7)
Hgb A1c MFr Bld: 6.7 % — ABNORMAL HIGH (ref <5.7)
Mean Plasma Glucose: 143 mg/dL — ABNORMAL HIGH (ref <117)
Mean Plasma Glucose: 146 mg/dL — ABNORMAL HIGH (ref <117)

## 2014-07-04 LAB — PROTIME-INR
INR: 1.14 (ref 0.00–1.49)
Prothrombin Time: 14.7 seconds (ref 11.6–15.2)

## 2014-07-04 LAB — HEPARIN LEVEL (UNFRACTIONATED)
Heparin Unfractionated: 0.15 IU/mL — ABNORMAL LOW (ref 0.30–0.70)
Heparin Unfractionated: 0.3 IU/mL (ref 0.30–0.70)

## 2014-07-04 LAB — POCT ACTIVATED CLOTTING TIME: Activated Clotting Time: 79 seconds

## 2014-07-04 LAB — TROPONIN I
TROPONIN I: 5.06 ng/mL — AB (ref <0.30)
Troponin I: 2.74 ng/mL (ref <0.30)

## 2014-07-04 SURGERY — LEFT HEART CATHETERIZATION WITH CORONARY ANGIOGRAM
Anesthesia: LOCAL

## 2014-07-04 MED ORDER — FUROSEMIDE 10 MG/ML IJ SOLN
40.0000 mg | Freq: Once | INTRAMUSCULAR | Status: AC
  Administered 2014-07-04: 40 mg via INTRAVENOUS
  Filled 2014-07-04: qty 4

## 2014-07-04 MED ORDER — LIVING BETTER WITH HEART FAILURE BOOK: Freq: Once | Status: DC

## 2014-07-04 MED ORDER — INSULIN GLARGINE 100 UNIT/ML ~~LOC~~ SOLN
22.0000 [IU] | Freq: Every day | SUBCUTANEOUS | Status: DC
  Administered 2014-07-04: 22 [IU] via SUBCUTANEOUS
  Filled 2014-07-04 (×2): qty 0.22

## 2014-07-04 MED ORDER — HEPARIN (PORCINE) IN NACL 100-0.45 UNIT/ML-% IJ SOLN
1100.0000 [IU]/h | INTRAMUSCULAR | Status: AC
  Administered 2014-07-05 (×2): 1100 [IU]/h via INTRAVENOUS
  Filled 2014-07-04: qty 250

## 2014-07-04 MED ORDER — HEPARIN (PORCINE) IN NACL 2-0.9 UNIT/ML-% IJ SOLN
INTRAMUSCULAR | Status: AC
  Filled 2014-07-04: qty 1000

## 2014-07-04 MED ORDER — HEPARIN BOLUS VIA INFUSION
2000.0000 [IU] | Freq: Once | INTRAVENOUS | Status: AC
  Administered 2014-07-04: 2000 [IU] via INTRAVENOUS
  Filled 2014-07-04: qty 2000

## 2014-07-04 MED ORDER — LIDOCAINE HCL (PF) 1 % IJ SOLN
INTRAMUSCULAR | Status: AC
  Filled 2014-07-04: qty 30

## 2014-07-04 MED ORDER — NITROGLYCERIN 1 MG/10 ML FOR IR/CATH LAB
INTRA_ARTERIAL | Status: AC
  Filled 2014-07-04: qty 10

## 2014-07-04 MED ORDER — SODIUM CHLORIDE 0.9 % IV SOLN
INTRAVENOUS | Status: AC
  Administered 2014-07-04: 50 mL/h via INTRAVENOUS

## 2014-07-04 MED ORDER — MIDAZOLAM HCL 2 MG/2ML IJ SOLN
INTRAMUSCULAR | Status: AC
  Filled 2014-07-04: qty 2

## 2014-07-04 MED ORDER — FENTANYL CITRATE 0.05 MG/ML IJ SOLN
INTRAMUSCULAR | Status: AC
  Filled 2014-07-04: qty 2

## 2014-07-04 MED ORDER — LIVING WELL WITH DIABETES BOOK
Freq: Once | Status: AC
  Administered 2014-07-04: 03:00:00
  Filled 2014-07-04: qty 1

## 2014-07-04 MED ORDER — HEART ATTACK BOUNCING BOOK
Freq: Once | Status: AC
  Administered 2014-07-04: 03:00:00
  Filled 2014-07-04: qty 1

## 2014-07-04 MED ORDER — HYDROCOD POLST-CHLORPHEN POLST 10-8 MG/5ML PO LQCR
5.0000 mL | Freq: Two times a day (BID) | ORAL | Status: DC | PRN
Start: 2014-07-04 — End: 2014-07-06
  Administered 2014-07-04 – 2014-07-06 (×3): 5 mL via ORAL
  Filled 2014-07-04 (×3): qty 5

## 2014-07-04 MED ORDER — INSULIN ASPART 100 UNIT/ML ~~LOC~~ SOLN
4.0000 [IU] | Freq: Three times a day (TID) | SUBCUTANEOUS | Status: DC
  Administered 2014-07-05: 4 [IU] via SUBCUTANEOUS

## 2014-07-04 NOTE — Progress Notes (Signed)
UR Completed Johnny Gorter Graves-Bigelow, RN,BSN [phone]  

## 2014-07-04 NOTE — Progress Notes (Signed)
DAILY PROGRESS NOTE  Subjective:  Has refused treatment overnight - including labs and echo. Long discussion with him and his mother today, seems to be leaning more toward CABG. Now agreeable to echo and seeing Dr. Roxy Manns again. Diuresed 1.1L net negative overnight. Labs improved today, BG's improved. Troponin peaked at 5.75 and is trending down. On heparin.  Objective:  Temp:  [98.2 F (36.8 C)-99 F (37.2 C)] 98.7 F (37.1 C) (11/04 0812) Pulse Rate:  [45-132] 45 (11/04 0812) Resp:  [15-27] 15 (11/04 1130) BP: (93-120)/(38-87) 101/62 mmHg (11/04 0812) SpO2:  [90 %-98 %] 95 % (11/04 0812) Weight:  [171 lb 15.3 oz (78 kg)-172 lb 11.2 oz (78.336 kg)] 172 lb 11.2 oz (78.336 kg) (11/04 0450) Weight change:   Intake/Output from previous day: 11/03 0701 - 11/04 0700 In: 544.7 [P.O.:440; I.V.:104.7] Out: 1675 [Urine:1675]  Intake/Output from this shift:    Medications: Current Facility-Administered Medications  Medication Dose Route Frequency Provider Last Rate Last Dose  . 0.9 %  sodium chloride infusion   Intravenous Continuous Gardiner Barefoot, NP 10 mL/hr at 07/03/14 2030    . acetaminophen (TYLENOL) tablet 650 mg  650 mg Oral Q6H PRN Reyne Dumas, MD       Or  . acetaminophen (TYLENOL) suppository 650 mg  650 mg Rectal Q6H PRN Reyne Dumas, MD      . ARIPiprazole (ABILIFY) tablet 5 mg  5 mg Oral BID Reyne Dumas, MD   5 mg at 07/04/14 1005  . atorvastatin (LIPITOR) tablet 40 mg  40 mg Oral q1800 Reyne Dumas, MD      . dextrose (GLUTOSE) 40 % oral gel 37.5 g  1 Tube Oral PRN Gardiner Barefoot, NP      . dextrose 50 % solution 25 mL  25 mL Intravenous PRN Reyne Dumas, MD      . DULoxetine (CYMBALTA) DR capsule 40 mg  40 mg Oral Daily Reyne Dumas, MD   40 mg at 07/04/14 1005  . heparin ADULT infusion 100 units/mL (25000 units/250 mL)  1,000 Units/hr Intravenous Continuous Reyne Dumas, MD 10 mL/hr at 07/04/14 0215 1,000 Units/hr at 07/04/14 0215  . insulin aspart  (novoLOG) injection 0-15 Units  0-15 Units Subcutaneous TID WC Gardiner Barefoot, NP   3 Units at 07/04/14 [phone]  . insulin aspart (novoLOG) injection 0-5 Units  0-5 Units Subcutaneous QHS Gardiner Barefoot, NP   2 Units at 07/03/14 2309  . insulin glargine (LANTUS) injection 18 Units  18 Units Subcutaneous QHS Gardiner Barefoot, NP   18 Units at 07/03/14 2309  . insulin regular (NOVOLIN R,HUMULIN R) 250 Units in sodium chloride 0.9 % 250 mL (1 Units/mL) infusion   Intravenous Continuous Gardiner Barefoot, NP   0  at 07/03/14 1904  . levalbuterol (XOPENEX) nebulizer solution 0.63 mg  0.63 mg Nebulization Q6H PRN Reyne Dumas, MD      . levothyroxine (SYNTHROID, LEVOTHROID) tablet 125 mcg  125 mcg Oral QAC breakfast Reyne Dumas, MD   125 mcg at 07/04/14 0815  . lisinopril (PRINIVIL,ZESTRIL) tablet 5 mg  5 mg Oral Daily Reyne Dumas, MD   5 mg at 07/04/14 1005  . Living Better with Heart Failure Book   Does not apply Once Reyne Dumas, MD      . morphine 2 MG/ML injection 1 mg  1 mg Intravenous Q4H PRN Reyne Dumas, MD      . ondansetron (ZOFRAN) tablet 4 mg  4 mg Oral Q6H  PRN Reyne Dumas, MD       Or  . ondansetron (ZOFRAN) injection 4 mg  4 mg Intravenous Q6H PRN Reyne Dumas, MD      . sodium chloride 0.9 % injection 3 mL  3 mL Intravenous Q12H Reyne Dumas, MD   3 mL at 07/04/14 1007    Physical Exam: General appearance: alert and no distress Neck: no carotid bruit and no JVD Lungs: diminished breath sounds bibasilar Heart: regular rate and rhythm Abdomen: soft, non-tender; bowel sounds normal; no masses,  no organomegaly Extremities: edema 1+ Pulses: 2+ and symmetric Skin: Skin color, texture, turgor normal. No rashes or lesions Neurologic: Grossly normal Psych: Anxious  Lab Results: Results for orders placed or performed during the hospital encounter of 07/03/14 (from the past 48 hour(s))  Comprehensive metabolic panel     Status: Abnormal   Collection Time: 07/03/14   2:53 PM  Result Value Ref Range   Sodium 131 (L) 137 - 147 mEq/L   Potassium 7.1 (HH) 3.7 - 5.3 mEq/L    Comment: CRITICAL RESULT CALLED TO, READ BACK BY AND VERIFIED WITH: C KEMP,RN 1624 07/03/14 D BRADLEY    Chloride 93 (L) 96 - 112 mEq/L   CO2 23 19 - 32 mEq/L   Glucose, Bld 513 (H) 70 - 99 mg/dL   BUN 22 6 - 23 mg/dL   Creatinine, Ser 1.04 0.50 - 1.35 mg/dL   Calcium 9.4 8.4 - 10.5 mg/dL   Total Protein 7.7 6.0 - 8.3 g/dL   Albumin 4.1 3.5 - 5.2 g/dL   AST 55 (H) 0 - 37 U/L   ALT 39 0 - 53 U/L   Alkaline Phosphatase 101 39 - 117 U/L   Total Bilirubin 0.8 0.3 - 1.2 mg/dL   GFR calc non Af Amer 83 (L) >90 mL/min   GFR calc Af Amer >90 >90 mL/min    Comment: (NOTE) The eGFR has been calculated using the CKD EPI equation. This calculation has not been validated in all clinical situations. eGFR's persistently <90 mL/min signify possible Chronic Kidney Disease.    Anion gap 15 5 - 15  CBC with Differential     Status: Abnormal   Collection Time: 07/03/14  2:53 PM  Result Value Ref Range   WBC 14.0 (H) 4.0 - 10.5 K/uL   RBC 4.52 4.22 - 5.81 MIL/uL   Hemoglobin 13.8 13.0 - 17.0 g/dL   HCT 41.6 39.0 - 52.0 %   MCV 92.0 78.0 - 100.0 fL   MCH 30.5 26.0 - 34.0 pg   MCHC 33.2 30.0 - 36.0 g/dL   RDW 14.4 11.5 - 15.5 %   Platelets 357 150 - 400 K/uL   Neutrophils Relative % 78 (H) 43 - 77 %   Neutro Abs 10.9 (H) 1.7 - 7.7 K/uL   Lymphocytes Relative 14 12 - 46 %   Lymphs Abs 2.0 0.7 - 4.0 K/uL   Monocytes Relative 7 3 - 12 %   Monocytes Absolute 1.0 0.1 - 1.0 K/uL   Eosinophils Relative 1 0 - 5 %   Eosinophils Absolute 0.2 0.0 - 0.7 K/uL   Basophils Relative 0 0 - 1 %   Basophils Absolute 0.0 0.0 - 0.1 K/uL  Troponin I     Status: Abnormal   Collection Time: 07/03/14  2:53 PM  Result Value Ref Range   Troponin I 4.98 (HH) <0.30 ng/mL    Comment:        Due to the release  kinetics of cTnI, a negative result within the first hours of the onset of symptoms does not rule  out myocardial infarction with certainty. If myocardial infarction is still suspected, repeat the test at appropriate intervals. CRITICAL RESULT CALLED TO, READ BACK BY AND VERIFIED WITH: C KEMP,RN 1624 07/03/14 D BRADLEY   Pro b natriuretic peptide (BNP)     Status: Abnormal   Collection Time: 07/03/14  2:53 PM  Result Value Ref Range   Pro B Natriuretic peptide (BNP) 10357.0 (H) 0 - 125 pg/mL  Lipase, blood     Status: Abnormal   Collection Time: 07/03/14  2:53 PM  Result Value Ref Range   Lipase 10 (L) 11 - 59 U/L  Urinalysis, Routine w reflex microscopic     Status: Abnormal   Collection Time: 07/03/14  4:15 PM  Result Value Ref Range   Color, Urine YELLOW YELLOW   APPearance CLEAR CLEAR   Specific Gravity, Urine 1.030 1.005 - 1.030   pH 6.5 5.0 - 8.0   Glucose, UA >1000 (A) NEGATIVE mg/dL   Hgb urine dipstick NEGATIVE NEGATIVE   Bilirubin Urine NEGATIVE NEGATIVE   Ketones, ur 15 (A) NEGATIVE mg/dL   Protein, ur NEGATIVE NEGATIVE mg/dL   Urobilinogen, UA 0.2 0.0 - 1.0 mg/dL   Nitrite NEGATIVE NEGATIVE   Leukocytes, UA NEGATIVE NEGATIVE  Urine microscopic-add on     Status: None   Collection Time: 07/03/14  4:15 PM  Result Value Ref Range   Squamous Epithelial / LPF RARE RARE   WBC, UA 0-2 <3 WBC/hpf   RBC / HPF 0-2 <3 RBC/hpf   Bacteria, UA RARE RARE  CBG monitoring, ED     Status: Abnormal   Collection Time: 07/03/14  6:23 PM  Result Value Ref Range   Glucose-Capillary 389 (H) 70 - 99 mg/dL  Glucose, capillary     Status: Abnormal   Collection Time: 07/03/14  8:21 PM  Result Value Ref Range   Glucose-Capillary 479 (H) 70 - 99 mg/dL  Glucose, capillary     Status: Abnormal   Collection Time: 07/03/14  9:24 PM  Result Value Ref Range   Glucose-Capillary 347 (H) 70 - 99 mg/dL  Magnesium     Status: None   Collection Time: 07/03/14  9:36 PM  Result Value Ref Range   Magnesium 1.9 1.5 - 2.5 mg/dL  TSH     Status: Abnormal   Collection Time: 07/03/14  9:36 PM   Result Value Ref Range   TSH 16.480 (H) 0.350 - 4.500 uIU/mL  CBC WITH DIFFERENTIAL     Status: Abnormal   Collection Time: 07/03/14  9:36 PM  Result Value Ref Range   WBC 11.0 (H) 4.0 - 10.5 K/uL   RBC 4.26 4.22 - 5.81 MIL/uL   Hemoglobin 13.0 13.0 - 17.0 g/dL   HCT 38.8 (L) 39.0 - 52.0 %   MCV 91.1 78.0 - 100.0 fL   MCH 30.5 26.0 - 34.0 pg   MCHC 33.5 30.0 - 36.0 g/dL   RDW 14.4 11.5 - 15.5 %   Platelets 315 150 - 400 K/uL   Neutrophils Relative % 65 43 - 77 %   Neutro Abs 7.1 1.7 - 7.7 K/uL   Lymphocytes Relative 25 12 - 46 %   Lymphs Abs 2.7 0.7 - 4.0 K/uL   Monocytes Relative 7 3 - 12 %   Monocytes Absolute 0.8 0.1 - 1.0 K/uL   Eosinophils Relative 3 0 - 5 %   Eosinophils  Absolute 0.3 0.0 - 0.7 K/uL   Basophils Relative 0 0 - 1 %   Basophils Absolute 0.0 0.0 - 0.1 K/uL  Hemoglobin A1c     Status: Abnormal   Collection Time: 07/03/14  9:36 PM  Result Value Ref Range   Hgb A1c MFr Bld 6.7 (H) <5.7 %    Comment: (NOTE)                                                                       According to the ADA Clinical Practice Recommendations for 2011, when HbA1c is used as a screening test:  >=6.5%   Diagnostic of Diabetes Mellitus           (if abnormal result is confirmed) 5.7-6.4%   Increased risk of developing Diabetes Mellitus References:Diagnosis and Classification of Diabetes Mellitus,Diabetes XBMW,4132,44(WNUUV 1):S62-S69 and Standards of Medical Care in         Diabetes - 2011,Diabetes Care,2011,34 (Suppl 1):S11-S61.    Mean Plasma Glucose 146 (H) <117 mg/dL    Comment: Performed at [facility]  Troponin I     Status: Abnormal   Collection Time: 07/03/14  9:36 PM  Result Value Ref Range   Troponin I 5.75 (HH) <0.30 ng/mL    Comment:        Due to the release kinetics of cTnI, a negative result within the first hours of the onset of symptoms does not rule out myocardial infarction with certainty. If myocardial infarction is still  suspected, repeat the test at appropriate intervals. CRITICAL VALUE NOTED.  VALUE IS CONSISTENT WITH PREVIOUSLY REPORTED AND CALLED VALUE.   Basic metabolic panel     Status: Abnormal   Collection Time: 07/03/14  9:36 PM  Result Value Ref Range   Sodium 139 137 - 147 mEq/L   Potassium 4.4 3.7 - 5.3 mEq/L    Comment: DELTA CHECK NOTED   Chloride 97 96 - 112 mEq/L   CO2 27 19 - 32 mEq/L   Glucose, Bld 295 (H) 70 - 99 mg/dL   BUN 22 6 - 23 mg/dL   Creatinine, Ser 1.17 0.50 - 1.35 mg/dL   Calcium 9.9 8.4 - 10.5 mg/dL   GFR calc non Af Amer 72 (L) >90 mL/min   GFR calc Af Amer 84 (L) >90 mL/min    Comment: (NOTE) The eGFR has been calculated using the CKD EPI equation. This calculation has not been validated in all clinical situations. eGFR's persistently <90 mL/min signify possible Chronic Kidney Disease.    Anion gap 15 5 - 15  MRSA PCR Screening     Status: None   Collection Time: 07/03/14  9:40 PM  Result Value Ref Range   MRSA by PCR NEGATIVE NEGATIVE    Comment:        The GeneXpert MRSA Assay (FDA approved for NASAL specimens only), is one component of a comprehensive MRSA colonization surveillance program. It is not intended to diagnose MRSA infection nor to guide or monitor treatment for MRSA infections.   Glucose, capillary     Status: Abnormal   Collection Time: 07/03/14 10:35 PM  Result Value Ref Range   Glucose-Capillary 217 (H) 70 - 99 mg/dL  Glucose, capillary     Status:  Abnormal   Collection Time: 07/04/14 12:14 AM  Result Value Ref Range   Glucose-Capillary 199 (H) 70 - 99 mg/dL  Heparin level (unfractionated)     Status: Abnormal   Collection Time: 07/04/14 12:40 AM  Result Value Ref Range   Heparin Unfractionated 0.15 (L) 0.30 - 0.70 IU/mL    Comment:        IF HEPARIN RESULTS ARE BELOW EXPECTED VALUES, AND PATIENT DOSAGE HAS BEEN CONFIRMED, SUGGEST FOLLOW UP TESTING OF ANTITHROMBIN III LEVELS.   CBC     Status: Abnormal   Collection Time:  07/04/14 12:40 AM  Result Value Ref Range   WBC 9.6 4.0 - 10.5 K/uL   RBC 3.93 (L) 4.22 - 5.81 MIL/uL   Hemoglobin 11.8 (L) 13.0 - 17.0 g/dL   HCT 35.7 (L) 39.0 - 52.0 %   MCV 90.8 78.0 - 100.0 fL   MCH 30.0 26.0 - 34.0 pg   MCHC 33.1 30.0 - 36.0 g/dL   RDW 14.4 11.5 - 15.5 %   Platelets 318 150 - 400 K/uL  Troponin I     Status: Abnormal   Collection Time: 07/04/14 12:40 AM  Result Value Ref Range   Troponin I 5.06 (HH) <0.30 ng/mL    Comment:        Due to the release kinetics of cTnI, a negative result within the first hours of the onset of symptoms does not rule out myocardial infarction with certainty. If myocardial infarction is still suspected, repeat the test at appropriate intervals. CRITICAL VALUE NOTED.  VALUE IS CONSISTENT WITH PREVIOUSLY REPORTED AND CALLED VALUE.   Comprehensive metabolic panel     Status: Abnormal   Collection Time: 07/04/14 12:40 AM  Result Value Ref Range   Sodium 140 137 - 147 mEq/L   Potassium 4.0 3.7 - 5.3 mEq/L   Chloride 100 96 - 112 mEq/L   CO2 27 19 - 32 mEq/L   Glucose, Bld 206 (H) 70 - 99 mg/dL   BUN 22 6 - 23 mg/dL   Creatinine, Ser 1.24 0.50 - 1.35 mg/dL   Calcium 9.4 8.4 - 10.5 mg/dL   Total Protein 6.5 6.0 - 8.3 g/dL   Albumin 3.5 3.5 - 5.2 g/dL   AST 43 (H) 0 - 37 U/L   ALT 32 0 - 53 U/L   Alkaline Phosphatase 87 39 - 117 U/L   Total Bilirubin 0.6 0.3 - 1.2 mg/dL   GFR calc non Af Amer 67 (L) >90 mL/min   GFR calc Af Amer 78 (L) >90 mL/min    Comment: (NOTE) The eGFR has been calculated using the CKD EPI equation. This calculation has not been validated in all clinical situations. eGFR's persistently <90 mL/min signify possible Chronic Kidney Disease.    Anion gap 13 5 - 15  Hemoglobin A1c     Status: Abnormal   Collection Time: 07/04/14 12:40 AM  Result Value Ref Range   Hgb A1c MFr Bld 6.6 (H) <5.7 %    Comment: (NOTE)                                                                       According to the ADA  Clinical Practice Recommendations for 2011, when HbA1c is  used as a screening test:  >=6.5%   Diagnostic of Diabetes Mellitus           (if abnormal result is confirmed) 5.7-6.4%   Increased risk of developing Diabetes Mellitus References:Diagnosis and Classification of Diabetes Mellitus,Diabetes TJQZ,0092,33(AQTMA 1):S62-S69 and Standards of Medical Care in         Diabetes - 2011,Diabetes UQJF,3545,62 (Suppl 1):S11-S61.    Mean Plasma Glucose 143 (H) <117 mg/dL    Comment: Performed at [facility]  Lipid panel     Status: None   Collection Time: 07/04/14 12:40 AM  Result Value Ref Range   Cholesterol 143 0 - 200 mg/dL   Triglycerides 112 <150 mg/dL   HDL 58 >39 mg/dL   Total CHOL/HDL Ratio 2.5 RATIO   VLDL 22 0 - 40 mg/dL   LDL Cholesterol 63 0 - 99 mg/dL    Comment:        Total Cholesterol/HDL:CHD Risk Coronary Heart Disease Risk Table                     Men   Women  1/2 Average Risk   3.4   3.3  Average Risk       5.0   4.4  2 X Average Risk   9.6   7.1  3 X Average Risk  23.4   11.0        Use the calculated Patient Ratio above and the CHD Risk Table to determine the patient's CHD Risk.        ATP III CLASSIFICATION (LDL):  <100     mg/dL   Optimal  100-129  mg/dL   Near or Above                    Optimal  130-159  mg/dL   Borderline  160-189  mg/dL   High  >190     mg/dL   Very High   Glucose, capillary     Status: Abnormal   Collection Time: 07/04/14  4:47 AM  Result Value Ref Range   Glucose-Capillary 180 (H) 70 - 99 mg/dL  Protime-INR     Status: None   Collection Time: 07/04/14  5:20 AM  Result Value Ref Range   Prothrombin Time 14.7 11.6 - 15.2 seconds   INR 1.14 0.00 - 1.49  Glucose, capillary     Status: Abnormal   Collection Time: 07/04/14  7:48 AM  Result Value Ref Range   Glucose-Capillary 171 (H) 70 - 99 mg/dL    Imaging: Dg Chest 2 View  07/03/2014   CLINICAL DATA:  Cough and shortness of breath with history of coronary artery  disease. CHF and renal insufficiency  EXAM: CHEST  2 VIEW  COMPARISON:  Portable chest x-ray of March 31, 2014  FINDINGS: The lungs are adequately inflated. There is no focal infiltrate. The interstitial markings are increased bilaterally. The pulmonary vascularity is mildly engorged. The cardiac silhouette is enlarged. There is no pleural effusion or pneumothorax. here is stable anterior wedge compression of the body of T11.  IMPRESSION: Congestive heart failure with mild pulmonary interstitial edema. There is no pleural effusion.   Electronically Signed   By: David  Martinique   On: 07/03/2014 15:47    Assessment:  1. Principal Problem: 2.   NSTEMI (non-ST elevated myocardial infarction) 3. Active Problems: 4.   Type 1 diabetes mellitus 5.   Acute on chronic systolic congestive heart failure, NYHA class 4 6.  Diabetic hyperosmolar non-ketotic state 7.   CAD, multiple vessel 8.   Hyperkalemia 9.   Plan:  1. NSTEMI: plan for LHC today, he is agreeable to this. D/W Dr. Roxy Manns, he will see regarding possible CABG after his LHC today. Keep NPO. 2D echo scheduled - he is now agreeable to this. 2. DM1 - per medicine team. BG's improved 3. Acute systolic congestive heart failure - diuresing well, will need to continue diuretics 4. Hyperkalemia - resolved  Time Spent Directly with Patient:   60 minutes  Length of Stay:  LOS: 1 day   Pixie Casino, MD, [facility] Attending Cardiologist [facility]  Batool Majid C 07/04/2014, 11:47 AM

## 2014-07-04 NOTE — Consult Note (Addendum)
[address]       [name] [address]             [phone]          CARDIOTHORACIC SURGERY CONSULTATION REPORT  PCP is No PCP Per Patient Referring Provider is Chrystie Nose, MD   Reason for consultation:  NSTEMI  HPI:  Reginald Gutierrez is a 48 year old male who has a past medical history significant for severe three-vessel coronary artery disease, chronic systolic congestive heart failure, hypertension, type 1 diabetes mellitus, hyperlipidemia, long-standing tobacco abuse, remote history of cocaine abuse, and chronic pain with continuous narcotic dependence. He was originally diagnosed with coronary artery disease in 2013. At that time he presented with an acute non-ST segment elevation myocardial infarction. He was seen in office June of this year to discuss surgical options, with CABG discussed at length with patient and mother, at which time the patient wanted to consider all options but was leaning toward surgery. Patient however missed following appointment, and was no longer considering continuing plans of procedure.   Patient presented to [facility] on 11/3 with NSTEMI following which cardiothoracic surgery was again consulted for possible surgical intervention. Today, the patient continues to be apprehensive about considering open heart surgery, but has not completely ruled out the possibility of having CABG procedure.   Past Medical History  Diagnosis Date  . Diabetes mellitus type 1   . Hyperlipidemia   . Depression   . Umbilical hernia   . Hypertension   . Glaucoma   . CHF (congestive heart failure)   . Hypothyroidism   . Hypoglycemia, unspecified   . Disorder of bone and cartilage, unspecified   . Acute posthemorrhagic anemia   . Anxiety state, unspecified   . Chronic pain syndrome   . Acute respiratory failure   . Dysphagia, oral phase   . Stress fracture of tibia or fibula   . Syncope and collapse   . Coronary artery disease 07/19/2012  .  Ischemic cardiomyopathy   . Narcotic dependency, continuous   . Heart attack 12/16/11  . NSTEMI (non-ST elevated myocardial infarction) 12/19/2011; 07/03/2014    ; Hattie Perch 07/03/2014    Past Surgical History  Procedure Laterality Date  . Appendectomy      open  . Tibia im nail insertion  12/18/2011    Procedure: INTRAMEDULLARY (IM) NAIL TIBIAL;  Surgeon: Eugenia Mcalpine, MD;  Location: [facility] ORS;  Service: Orthopedics;  Laterality: Bilateral;  . Insertion of dialysis catheter  12/31/2011    Procedure: INSERTION OF DIALYSIS CATHETER;  Surgeon: Chuck Hint, MD;  Location: [facility] OR;  Service: Vascular;  Laterality: N/A;; "while in ICU; not permanent  . Tonsillectomy  1980  . Fracture surgery  2012    wrist  . Fracture surgery  2013    bilateral tibia  . Cardiac catheterization  07/2012; 01/2014    Family History  Problem Relation Age of Onset  . Heart disease Father     History   Social History  . Marital Status: Single    Spouse Name: N/A    Number of Children: 0  . Years of Education: 16   Occupational History  . Sales- disabled    Social History Main Topics  . Smoking status: Current Some Day Smoker -- 1.00 packs/day    Types: Cigarettes  . Smokeless tobacco: Never Used  . Alcohol Use: No  . Drug Use: No     Comment: Past Cocaine  abuse-2002-2003  . Sexual Activity: No   Other Topics Concern  . Not on file   Social History Narrative    Prior to Admission medications   Medication Sig Start Date End Date Taking? Authorizing Provider  aspirin EC 81 MG tablet Take 81 mg by mouth daily.   Yes Historical Provider, MD  atorvastatin (LIPITOR) 40 MG tablet Take 40 mg by mouth daily.   Yes Historical Provider, MD  atorvastatin (LIPITOR) 40 MG tablet take 1 tablet by mouth once daily 05/21/14  Yes Tiffany L Reed, DO  ferrous sulfate 325 (65 FE) MG tablet Take 325 mg by mouth daily with breakfast.   Yes Historical Provider, MD  Insulin Glargine (LANTUS) 100 UNIT/ML Solostar  Pen Inject 15 Units into the skin every morning. 07/02/14  Yes Romero Belling, MD  insulin regular (NOVOLIN R,HUMULIN R) 100 units/mL injection Inject 2 units 4 times a daily if needed for blood sugar over 250 04/30/14  Yes Romero Belling, MD  levothyroxine (SYNTHROID, LEVOTHROID) 125 MCG tablet Take 1 tablet (125 mcg total) by mouth daily before breakfast. 09/04/13  Yes Romero Belling, MD  lisinopril (PRINIVIL,ZESTRIL) 40 MG tablet take 1/2 tablet by mouth once daily 05/02/14  Yes Mahima Pandey, MD  lisinopril (PRINIVIL,ZESTRIL) 40 MG tablet Take 25 mg by mouth daily.   Yes Historical Provider, MD  LORazepam (ATIVAN) 2 MG tablet Take 1 tablet (2 mg) twice daily, may take an additional tablet during the day for anxiety if needed 03/19/14  Yes Tiffany L Reed, DO  metoprolol tartrate (LOPRESSOR) 25 MG tablet APPOINTMENT OVERDUE 1/2 tablet twice a day Patient taking differently: Take 12.5 mg by mouth 2 (two) times daily.  06/20/14  Yes Mahima Glade Lloyd, MD  Multiple Vitamin (MULTIVITAMIN WITH MINERALS) TABS tablet Take 1 tablet by mouth daily. Diabetes daily pak from [employer]   Yes Historical Provider, MD  oxyCODONE (ROXICODONE) 15 MG immediate release tablet Take 15-30 mg by mouth See admin instructions. Take by mouth 5 times daily as needed for pain   Yes Historical Provider, MD  ACCU-CHEK AVIVA PLUS test strip TEST 6 TIMES A DAY AS DIRECTED. 05/04/13   Romero Belling, MD  ARIPiprazole (ABILIFY) 5 MG tablet Take 1 tablet (5 mg total) by mouth 2 (two) times daily. 04/06/14   Alison Murray, MD  DULoxetine 40 MG CPEP Take 40 mg by mouth daily. 04/06/14   Alison Murray, MD  glucagon (GLUCAGON EMERGENCY) 1 MG injection Inject 1 mg into the muscle once as needed (for low blood sugar). 03/19/14   Romero Belling, MD  glucose blood test strip TEST BLOOD SUGAR 6 TIMES A DAY AS INSTRUCTED BY DR Everardo All 05/04/13   Romero Belling, MD  INSULIN SYRINGE .5CC/29G 29G X 1/2" 0.5 ML MISC 1 Syringe by Does not apply route daily. 05/18/13   Romero Belling,  MD  Insulin Syringe-Needle U-100 (B-D INS SYRINGE 0.5CC/30GX1/2") 30G X 1/2" 0.5 ML MISC Use 3-4 times per day 06/15/14   Romero Belling, MD  Oxycodone HCl 20 MG TABS Take 1 tablet (20 mg total) by mouth 4 (four) times daily as needed. 04/06/14   Alison Murray, MD    Current Facility-Administered Medications  Medication Dose Route Frequency Provider Last Rate Last Dose  . 0.9 %  sodium chloride infusion   Intravenous Continuous Leda Gauze, NP 10 mL/hr at 07/03/14 2030    . acetaminophen (TYLENOL) tablet 650 mg  650 mg Oral Q6H PRN Richarda Overlie, MD  Or  . acetaminophen (TYLENOL) suppository 650 mg  650 mg Rectal Q6H PRN Richarda Overlie, MD      . ARIPiprazole (ABILIFY) tablet 5 mg  5 mg Oral BID Richarda Overlie, MD   5 mg at 07/04/14 1005  . atorvastatin (LIPITOR) tablet 40 mg  40 mg Oral q1800 Richarda Overlie, MD      . dextrose (GLUTOSE) 40 % oral gel 37.5 g  1 Tube Oral PRN Leda Gauze, NP      . dextrose 50 % solution 25 mL  25 mL Intravenous PRN Richarda Overlie, MD      . DULoxetine (CYMBALTA) DR capsule 40 mg  40 mg Oral Daily Richarda Overlie, MD   40 mg at 07/04/14 1005  . heparin ADULT infusion 100 units/mL (25000 units/250 mL)  1,000 Units/hr Intravenous Continuous Richarda Overlie, MD 10 mL/hr at 07/04/14 1318 1,000 Units/hr at 07/04/14 1318  . insulin aspart (novoLOG) injection 0-15 Units  0-15 Units Subcutaneous TID WC Leda Gauze, NP   3 Units at 07/04/14 [phone]  . insulin aspart (novoLOG) injection 0-5 Units  0-5 Units Subcutaneous QHS Leda Gauze, NP   2 Units at 07/03/14 2309  . insulin glargine (LANTUS) injection 18 Units  18 Units Subcutaneous QHS Leda Gauze, NP   18 Units at 07/03/14 2309  . insulin regular (NOVOLIN R,HUMULIN R) 250 Units in sodium chloride 0.9 % 250 mL (1 Units/mL) infusion   Intravenous Continuous Leda Gauze, NP   0  at 07/03/14 1904  . levalbuterol (XOPENEX) nebulizer solution 0.63 mg  0.63 mg Nebulization Q6H PRN Richarda Overlie, MD      . levothyroxine (SYNTHROID, LEVOTHROID) tablet 125 mcg  125 mcg Oral QAC breakfast Richarda Overlie, MD   125 mcg at 07/04/14 0815  . lisinopril (PRINIVIL,ZESTRIL) tablet 5 mg  5 mg Oral Daily Richarda Overlie, MD   5 mg at 07/04/14 1005  . Living Better with Heart Failure Book   Does not apply Once Richarda Overlie, MD      . morphine 2 MG/ML injection 1 mg  1 mg Intravenous Q4H PRN Richarda Overlie, MD      . ondansetron (ZOFRAN) tablet 4 mg  4 mg Oral Q6H PRN Richarda Overlie, MD       Or  . ondansetron (ZOFRAN) injection 4 mg  4 mg Intravenous Q6H PRN Richarda Overlie, MD      . sodium chloride 0.9 % injection 3 mL  3 mL Intravenous Q12H Richarda Overlie, MD   3 mL at 07/04/14 1007    No Known Allergies    Review of Systems:  General:decreased appetite, decreased energy, + weight gain, no weight loss, no fever Cardiac:+ chest pain with exertion, + atypical chest pain at rest, + SOB with exertion, no resting SOB, no PND, no orthopnea, no palpitations, no arrhythmia, no atrial fibrillation, no LE edema, + dizzy spells, no syncope Respiratory:+ shortness of breath, no home oxygen, + productive cough, + dry cough, no bronchitis, + wheezing, no hemoptysis, no asthma, no pain with inspiration or cough, no sleep apnea, no CPAP at night GI:no difficulty swallowing, no reflux, no frequent heartburn, no hiatal hernia, + abdominal pain, + intermittent constipation, occasional diarrhea, no hematochezia, no hematemesis, no melena GU:no dysuria, no frequency, no urinary tract infection, no hematuria, no enlarged prostate, no kidney stones, no kidney disease Vascular:no pain suggestive of claudication, + pain in feet, no leg cramps, no varicose veins, no DVT, no non-healing foot  ulcer Neuro:? stroke, ? TIA's, + seizures, no headaches, no temporary blindness one eye, no slurred speech, no peripheral neuropathy, + chronic pain, no instability of gait, + memory/cognitive dysfunction Musculoskeletal:+ arthritis, no joint swelling, no myalgias, no difficulty walking, decreased mobility  Skin:no rash, no itching, + recent skin infection both legs from insect bites - on oral antibiotics, no pressure sores or ulcerations Psych:+ anxiety, + depression, + nervousness, + unusual recent stress Eyes:no blurry vision, no floaters, + recent vision changes, + wears glasses or contacts ENT:no hearing loss, no loose or painful teeth, no dentures, last saw dentist ? Hematologic:no easy bruising, no abnormal bleeding, no clotting disorder, no frequent epistaxis Endocrine:+ diabetes, checks CBG's at home    Physical Exam:   BP 101/62 mmHg  Pulse 45  Temp(Src) 98.7 F (37.1 C) (Oral)  Resp 15  Ht 5' 6.14" (1.68 m)  Wt 172 lb 11.2 oz (78.336 kg)  BMI 27.76 kg/m2  SpO2 95%  General:  Patient lying in bed,  well-appearing, in NAD  HEENT:  Grossly unremarkable  Neck:   no JVD, no bruits, no adenopathy   Chest:   clear to auscultation, symmetrical breath sounds, no wheezes, no rhonchi   CV:   RRR, no rubs, gallops or murmur   Abdomen:  soft, non-tender, non distended, no masses  Extremities:  warm, well-perfused, pulses intact, no lower extremity edema  Rectal/GU  Deferred  Neuro:   Grossly non-focal and symmetrical throughout  Skin:   Clean and dry, no rashes, no breakdown  Diagnostic Tests:   CXR: EXAM: CHEST 2 VIEW  COMPARISON: Portable chest x-ray of March 31, 2014  FINDINGS: The lungs are adequately  inflated. There is no focal infiltrate. The interstitial markings are increased bilaterally. The pulmonary vascularity is mildly engorged. The cardiac silhouette is enlarged. There is no pleural effusion or pneumothorax. here is stable anterior wedge compression of the body of T11.  IMPRESSION: Congestive heart failure with mild pulmonary interstitial edema. There is no pleural effusion.   Electronically Signed  By: David SwazilandJordan  On: 07/03/2014 15:47   Troponin level peaked at 5.75 on 11/3, now trending down at 2.74 today at 11 AM   Impression:  Patient would be an acceptable candidate for CABG if he desires to move forward with surgical intervention, and would expect benefit from procedure.   Plan:  Patient continues to lean more toward not having surgery at this time. We will continue to follow patient, and will reassess decision tomorrow, pending CATH results. Patient refused cardiac ECHO last evening, and would need ECHO performed before surgery, if patient is willing to undergo surgical procedure.    Reginald CoilRebecca Cooper, PA-S2  07/04/2014 1:33 PM   I have seen and examined the patient and agree with the assessment and plan as outlined.  Patient is well known to me from consultation this past June.  Events of this admission noted.  Repeat cath today reviewed.  ECHO pending.  Discussed findings with patient who is currently agreeable w/ surgery.  We tentatively plan CABG for Friday 11/6 unless he changes his mind.    I spent in excess of 30 minutes during the conduct of this initial hospital encounter and >50% of this time involved direct face-to-face encounter with the patient for counseling and/or coordination of their care.                     Isiah Scheel H 07/04/2014 4:08 PM

## 2014-07-04 NOTE — Interval H&P Note (Signed)
Cath Lab Visit (complete for each Cath Lab visit)  Clinical Evaluation Leading to the Procedure:   ACS: Yes.    Non-ACS:    Anginal Classification: CCS III  Anti-ischemic medical therapy: Maximal Therapy (2 or more classes of medications)  Non-Invasive Test Results: No non-invasive testing performed  Prior CABG: No previous CABG      History and Physical Interval Note:  07/04/2014 2:27 PM  Reginald Gutierrez  has presented today for surgery, with the diagnosis of cp  The various methods of treatment have been discussed with the patient and family. After consideration of risks, benefits and other options for treatment, the patient has consented to  Procedure(s): LEFT HEART CATHETERIZATION WITH CORONARY ANGIOGRAM (N/A) as a surgical intervention .  The patient's history has been reviewed, patient examined, no change in status, stable for surgery.  I have reviewed the patient's chart and labs.  Questions were answered to the patient's satisfaction.     Reginald Gutierrez

## 2014-07-04 NOTE — Care Management Note (Unsigned)
    Page 1 of 1   07/04/2014     10:44:08 AM CARE MANAGEMENT NOTE 07/04/2014  Patient:  Reginald Gutierrez, Reginald Gutierrez   Account Number:  [id number]  Date Initiated:  07/04/2014  Documentation initiated by:  GRAVES-BIGELOW,Reon Hunley  Subjective/Objective Assessment:   Pt admitted for Shortness of Breath- cardiac enzymes positive Nstemi.     Action/Plan:   CM to monitor for disposition needs.   Anticipated DC Date:  07/05/2014   Anticipated DC Plan:  HOME/SELF CARE      DC Planning Services  CM consult      Choice offered to / List presented to:             Status of service:  In process, will continue to follow Medicare Important Message given?   (If response is "NO", the following Medicare IM given date fields will be blank) Date Medicare IM given:   Medicare IM given by:   Date Additional Medicare IM given:   Additional Medicare IM given by:    Discharge Disposition:    Per UR Regulation:  Reviewed for med. necessity/level of care/duration of stay  If discussed at Long Length of Stay Meetings, dates discussed:    Comments:

## 2014-07-04 NOTE — CV Procedure (Signed)
     Left Heart Catheterization with Coronary Angiography  Report  Reginald Gutierrez  48 y.o.  male 06/25/1966  Procedure Date: 07/04/2014 Referring Physician: [place] Hilty, MD/ Tressie Stalker Primary Cardiologist: Mendel Ryder  INDICATIONS: NSTEMI  PROCEDURE: 1. Left heart cath; 2. Coronary angiography; 3. Left ventriculography  CONSENT:  The risks, benefits, and details of the procedure were explained in detail to the patient. Risks including death, stroke, heart attack, kidney injury, allergy, limb ischemia, bleeding and radiation injury were discussed.  The patient verbalized understanding and wanted to proceed.  Informed written consent was obtained.  PROCEDURE TECHNIQUE:  After Xylocaine anesthesia a 5 French sheath was placed in the right femoral artery using the modified Seldinger technique.  Coronary angiography was done using a 5 F A2 multipurpose and JR4 diagnostic catheters.  Left ventriculography was done using the A2 multipurpose catheter and hand injection.   Images were reviewed and case was terminated.   CONTRAST:  Total of 70 cc.  COMPLICATIONS:  none   HEMODYNAMICS:  Aortic pressure 121/60 mmHg; LV pressure 122/10 mmHg; LVEDP 17 mmHg  ANGIOGRAPHIC DATA:   The left main coronary artery is diffusely disease but does not have high-grade obstruction..  The left anterior descending artery is severely and diffusely diseased from proximal to mid vessel. Stenosis in this region ranges in the 60-80% range. A large second diagonal contains ostial 80% narrowing. The mid to distal LAD contains a subtotal 95% stenosis to distal LAD and apical LAD is diffusely diseased but without high-grade obstruction..  The left circumflex artery is severely and diffusely disease. There is heavy calcification and 90% stenosis of the large first obtuse marginal. The proximal circumflex after the first marginal was 80% obstructed. The vessel beyond this obstruction is relatively small but supplies  collaterals to the distal RCA..  The right coronary artery is functionally occluded in the distal segment. The distal right coronary supplied by left-to-right collaterals. The PDA is diffusely diseased.Marland Kitchen   LEFT VENTRICULOGRAM:  Left ventricular angiogram was done in the 30 RAO projection and revealed diffuse hypokinesis impairing inferior wall motion more than other segments. EF 30 to 35% .   IMPRESSIONS:  1. Severe diffuse three-vessel coronary disease with total occlusion of distal RCA, high-grade and diffuse obstruction in the proximal to mid LAD, high-grade obstruction and a large diagonal, high-grade obstruction in the proximal circumflex/obtuse marginal.  2. When compared to the prior angiogram done in July 2015, no significant changes occurred  3. Systolic heart failure, ischemic in nature with EF 30-35%   RECOMMENDATION:  Surgical revascularization is being contemplated. The patient's attitude concerning treatment options suggests that he may not agree to the recommendation.   From an interventional standpoint, there are not get treatment options for the LAD. Potentially the circumflex could be treated with PCI although therapy would be a complicated bifurcation stent extending back to the left main. The right coronary is not treatable.surgery would appear to be his best option if he agrees and surgeon feels that distal targets are graftable.

## 2014-07-04 NOTE — H&P (View-Only) (Signed)
DAILY PROGRESS NOTE  Subjective:  Has refused treatment overnight - including labs and echo. Long discussion with him and his mother today, seems to be leaning more toward CABG. Now agreeable to echo and seeing Dr. Roxy Manns again. Diuresed 1.1L net negative overnight. Labs improved today, BG's improved. Troponin peaked at 5.75 and is trending down. On heparin.  Objective:  Temp:  [98.2 F (36.8 C)-99 F (37.2 C)] 98.7 F (37.1 C) (11/04 0812) Pulse Rate:  [45-132] 45 (11/04 0812) Resp:  [15-27] 15 (11/04 1130) BP: (93-120)/(38-87) 101/62 mmHg (11/04 0812) SpO2:  [90 %-98 %] 95 % (11/04 0812) Weight:  [171 lb 15.3 oz (78 kg)-172 lb 11.2 oz (78.336 kg)] 172 lb 11.2 oz (78.336 kg) (11/04 0450) Weight change:   Intake/Output from previous day: 11/03 0701 - 11/04 0700 In: 544.7 [P.O.:440; I.V.:104.7] Out: 1675 [Urine:1675]  Intake/Output from this shift:    Medications: Current Facility-Administered Medications  Medication Dose Route Frequency Provider Last Rate Last Dose  . 0.9 %  sodium chloride infusion   Intravenous Continuous Gardiner Barefoot, NP 10 mL/hr at 07/03/14 2030    . acetaminophen (TYLENOL) tablet 650 mg  650 mg Oral Q6H PRN Reyne Dumas, MD       Or  . acetaminophen (TYLENOL) suppository 650 mg  650 mg Rectal Q6H PRN Reyne Dumas, MD      . ARIPiprazole (ABILIFY) tablet 5 mg  5 mg Oral BID Reyne Dumas, MD   5 mg at 07/04/14 1005  . atorvastatin (LIPITOR) tablet 40 mg  40 mg Oral q1800 Reyne Dumas, MD      . dextrose (GLUTOSE) 40 % oral gel 37.5 g  1 Tube Oral PRN Gardiner Barefoot, NP      . dextrose 50 % solution 25 mL  25 mL Intravenous PRN Reyne Dumas, MD      . DULoxetine (CYMBALTA) DR capsule 40 mg  40 mg Oral Daily Reyne Dumas, MD   40 mg at 07/04/14 1005  . heparin ADULT infusion 100 units/mL (25000 units/250 mL)  1,000 Units/hr Intravenous Continuous Reyne Dumas, MD 10 mL/hr at 07/04/14 0215 1,000 Units/hr at 07/04/14 0215  . insulin aspart  (novoLOG) injection 0-15 Units  0-15 Units Subcutaneous TID WC Gardiner Barefoot, NP   3 Units at 07/04/14 [phone]  . insulin aspart (novoLOG) injection 0-5 Units  0-5 Units Subcutaneous QHS Gardiner Barefoot, NP   2 Units at 07/03/14 2309  . insulin glargine (LANTUS) injection 18 Units  18 Units Subcutaneous QHS Gardiner Barefoot, NP   18 Units at 07/03/14 2309  . insulin regular (NOVOLIN R,HUMULIN R) 250 Units in sodium chloride 0.9 % 250 mL (1 Units/mL) infusion   Intravenous Continuous Gardiner Barefoot, NP   0  at 07/03/14 1904  . levalbuterol (XOPENEX) nebulizer solution 0.63 mg  0.63 mg Nebulization Q6H PRN Reyne Dumas, MD      . levothyroxine (SYNTHROID, LEVOTHROID) tablet 125 mcg  125 mcg Oral QAC breakfast Reyne Dumas, MD   125 mcg at 07/04/14 0815  . lisinopril (PRINIVIL,ZESTRIL) tablet 5 mg  5 mg Oral Daily Reyne Dumas, MD   5 mg at 07/04/14 1005  . Living Better with Heart Failure Book   Does not apply Once Reyne Dumas, MD      . morphine 2 MG/ML injection 1 mg  1 mg Intravenous Q4H PRN Reyne Dumas, MD      . ondansetron (ZOFRAN) tablet 4 mg  4 mg Oral Q6H  PRN Reyne Dumas, MD       Or  . ondansetron (ZOFRAN) injection 4 mg  4 mg Intravenous Q6H PRN Reyne Dumas, MD      . sodium chloride 0.9 % injection 3 mL  3 mL Intravenous Q12H Reyne Dumas, MD   3 mL at 07/04/14 1007    Physical Exam: General appearance: alert and no distress Neck: no carotid bruit and no JVD Lungs: diminished breath sounds bibasilar Heart: regular rate and rhythm Abdomen: soft, non-tender; bowel sounds normal; no masses,  no organomegaly Extremities: edema 1+ Pulses: 2+ and symmetric Skin: Skin color, texture, turgor normal. No rashes or lesions Neurologic: Grossly normal Psych: Anxious  Lab Results: Results for orders placed or performed during the hospital encounter of 07/03/14 (from the past 48 hour(s))  Comprehensive metabolic panel     Status: Abnormal   Collection Time: 07/03/14   2:53 PM  Result Value Ref Range   Sodium 131 (L) 137 - 147 mEq/L   Potassium 7.1 (HH) 3.7 - 5.3 mEq/L    Comment: CRITICAL RESULT CALLED TO, READ BACK BY AND VERIFIED WITH: C KEMP,RN 1624 07/03/14 D BRADLEY    Chloride 93 (L) 96 - 112 mEq/L   CO2 23 19 - 32 mEq/L   Glucose, Bld 513 (H) 70 - 99 mg/dL   BUN 22 6 - 23 mg/dL   Creatinine, Ser 1.04 0.50 - 1.35 mg/dL   Calcium 9.4 8.4 - 10.5 mg/dL   Total Protein 7.7 6.0 - 8.3 g/dL   Albumin 4.1 3.5 - 5.2 g/dL   AST 55 (H) 0 - 37 U/L   ALT 39 0 - 53 U/L   Alkaline Phosphatase 101 39 - 117 U/L   Total Bilirubin 0.8 0.3 - 1.2 mg/dL   GFR calc non Af Amer 83 (L) >90 mL/min   GFR calc Af Amer >90 >90 mL/min    Comment: (NOTE) The eGFR has been calculated using the CKD EPI equation. This calculation has not been validated in all clinical situations. eGFR's persistently <90 mL/min signify possible Chronic Kidney Disease.    Anion gap 15 5 - 15  CBC with Differential     Status: Abnormal   Collection Time: 07/03/14  2:53 PM  Result Value Ref Range   WBC 14.0 (H) 4.0 - 10.5 K/uL   RBC 4.52 4.22 - 5.81 MIL/uL   Hemoglobin 13.8 13.0 - 17.0 g/dL   HCT 41.6 39.0 - 52.0 %   MCV 92.0 78.0 - 100.0 fL   MCH 30.5 26.0 - 34.0 pg   MCHC 33.2 30.0 - 36.0 g/dL   RDW 14.4 11.5 - 15.5 %   Platelets 357 150 - 400 K/uL   Neutrophils Relative % 78 (H) 43 - 77 %   Neutro Abs 10.9 (H) 1.7 - 7.7 K/uL   Lymphocytes Relative 14 12 - 46 %   Lymphs Abs 2.0 0.7 - 4.0 K/uL   Monocytes Relative 7 3 - 12 %   Monocytes Absolute 1.0 0.1 - 1.0 K/uL   Eosinophils Relative 1 0 - 5 %   Eosinophils Absolute 0.2 0.0 - 0.7 K/uL   Basophils Relative 0 0 - 1 %   Basophils Absolute 0.0 0.0 - 0.1 K/uL  Troponin I     Status: Abnormal   Collection Time: 07/03/14  2:53 PM  Result Value Ref Range   Troponin I 4.98 (HH) <0.30 ng/mL    Comment:        Due to the release  kinetics of cTnI, a negative result within the first hours of the onset of symptoms does not rule  out myocardial infarction with certainty. If myocardial infarction is still suspected, repeat the test at appropriate intervals. CRITICAL RESULT CALLED TO, READ BACK BY AND VERIFIED WITH: C KEMP,RN 1624 07/03/14 D BRADLEY   Pro b natriuretic peptide (BNP)     Status: Abnormal   Collection Time: 07/03/14  2:53 PM  Result Value Ref Range   Pro B Natriuretic peptide (BNP) 10357.0 (H) 0 - 125 pg/mL  Lipase, blood     Status: Abnormal   Collection Time: 07/03/14  2:53 PM  Result Value Ref Range   Lipase 10 (L) 11 - 59 U/L  Urinalysis, Routine w reflex microscopic     Status: Abnormal   Collection Time: 07/03/14  4:15 PM  Result Value Ref Range   Color, Urine YELLOW YELLOW   APPearance CLEAR CLEAR   Specific Gravity, Urine 1.030 1.005 - 1.030   pH 6.5 5.0 - 8.0   Glucose, UA >1000 (A) NEGATIVE mg/dL   Hgb urine dipstick NEGATIVE NEGATIVE   Bilirubin Urine NEGATIVE NEGATIVE   Ketones, ur 15 (A) NEGATIVE mg/dL   Protein, ur NEGATIVE NEGATIVE mg/dL   Urobilinogen, UA 0.2 0.0 - 1.0 mg/dL   Nitrite NEGATIVE NEGATIVE   Leukocytes, UA NEGATIVE NEGATIVE  Urine microscopic-add on     Status: None   Collection Time: 07/03/14  4:15 PM  Result Value Ref Range   Squamous Epithelial / LPF RARE RARE   WBC, UA 0-2 <3 WBC/hpf   RBC / HPF 0-2 <3 RBC/hpf   Bacteria, UA RARE RARE  CBG monitoring, ED     Status: Abnormal   Collection Time: 07/03/14  6:23 PM  Result Value Ref Range   Glucose-Capillary 389 (H) 70 - 99 mg/dL  Glucose, capillary     Status: Abnormal   Collection Time: 07/03/14  8:21 PM  Result Value Ref Range   Glucose-Capillary 479 (H) 70 - 99 mg/dL  Glucose, capillary     Status: Abnormal   Collection Time: 07/03/14  9:24 PM  Result Value Ref Range   Glucose-Capillary 347 (H) 70 - 99 mg/dL  Magnesium     Status: None   Collection Time: 07/03/14  9:36 PM  Result Value Ref Range   Magnesium 1.9 1.5 - 2.5 mg/dL  TSH     Status: Abnormal   Collection Time: 07/03/14  9:36 PM   Result Value Ref Range   TSH 16.480 (H) 0.350 - 4.500 uIU/mL  CBC WITH DIFFERENTIAL     Status: Abnormal   Collection Time: 07/03/14  9:36 PM  Result Value Ref Range   WBC 11.0 (H) 4.0 - 10.5 K/uL   RBC 4.26 4.22 - 5.81 MIL/uL   Hemoglobin 13.0 13.0 - 17.0 g/dL   HCT 38.8 (L) 39.0 - 52.0 %   MCV 91.1 78.0 - 100.0 fL   MCH 30.5 26.0 - 34.0 pg   MCHC 33.5 30.0 - 36.0 g/dL   RDW 14.4 11.5 - 15.5 %   Platelets 315 150 - 400 K/uL   Neutrophils Relative % 65 43 - 77 %   Neutro Abs 7.1 1.7 - 7.7 K/uL   Lymphocytes Relative 25 12 - 46 %   Lymphs Abs 2.7 0.7 - 4.0 K/uL   Monocytes Relative 7 3 - 12 %   Monocytes Absolute 0.8 0.1 - 1.0 K/uL   Eosinophils Relative 3 0 - 5 %   Eosinophils  Absolute 0.3 0.0 - 0.7 K/uL   Basophils Relative 0 0 - 1 %   Basophils Absolute 0.0 0.0 - 0.1 K/uL  Hemoglobin A1c     Status: Abnormal   Collection Time: 07/03/14  9:36 PM  Result Value Ref Range   Hgb A1c MFr Bld 6.7 (H) <5.7 %    Comment: (NOTE)                                                                       According to the ADA Clinical Practice Recommendations for 2011, when HbA1c is used as a screening test:  >=6.5%   Diagnostic of Diabetes Mellitus           (if abnormal result is confirmed) 5.7-6.4%   Increased risk of developing Diabetes Mellitus References:Diagnosis and Classification of Diabetes Mellitus,Diabetes XBMW,4132,44(WNUUV 1):S62-S69 and Standards of Medical Care in         Diabetes - 2011,Diabetes Care,2011,34 (Suppl 1):S11-S61.    Mean Plasma Glucose 146 (H) <117 mg/dL    Comment: Performed at [facility]  Troponin I     Status: Abnormal   Collection Time: 07/03/14  9:36 PM  Result Value Ref Range   Troponin I 5.75 (HH) <0.30 ng/mL    Comment:        Due to the release kinetics of cTnI, a negative result within the first hours of the onset of symptoms does not rule out myocardial infarction with certainty. If myocardial infarction is still  suspected, repeat the test at appropriate intervals. CRITICAL VALUE NOTED.  VALUE IS CONSISTENT WITH PREVIOUSLY REPORTED AND CALLED VALUE.   Basic metabolic panel     Status: Abnormal   Collection Time: 07/03/14  9:36 PM  Result Value Ref Range   Sodium 139 137 - 147 mEq/L   Potassium 4.4 3.7 - 5.3 mEq/L    Comment: DELTA CHECK NOTED   Chloride 97 96 - 112 mEq/L   CO2 27 19 - 32 mEq/L   Glucose, Bld 295 (H) 70 - 99 mg/dL   BUN 22 6 - 23 mg/dL   Creatinine, Ser 1.17 0.50 - 1.35 mg/dL   Calcium 9.9 8.4 - 10.5 mg/dL   GFR calc non Af Amer 72 (L) >90 mL/min   GFR calc Af Amer 84 (L) >90 mL/min    Comment: (NOTE) The eGFR has been calculated using the CKD EPI equation. This calculation has not been validated in all clinical situations. eGFR's persistently <90 mL/min signify possible Chronic Kidney Disease.    Anion gap 15 5 - 15  MRSA PCR Screening     Status: None   Collection Time: 07/03/14  9:40 PM  Result Value Ref Range   MRSA by PCR NEGATIVE NEGATIVE    Comment:        The GeneXpert MRSA Assay (FDA approved for NASAL specimens only), is one component of a comprehensive MRSA colonization surveillance program. It is not intended to diagnose MRSA infection nor to guide or monitor treatment for MRSA infections.   Glucose, capillary     Status: Abnormal   Collection Time: 07/03/14 10:35 PM  Result Value Ref Range   Glucose-Capillary 217 (H) 70 - 99 mg/dL  Glucose, capillary     Status:  Abnormal   Collection Time: 07/04/14 12:14 AM  Result Value Ref Range   Glucose-Capillary 199 (H) 70 - 99 mg/dL  Heparin level (unfractionated)     Status: Abnormal   Collection Time: 07/04/14 12:40 AM  Result Value Ref Range   Heparin Unfractionated 0.15 (L) 0.30 - 0.70 IU/mL    Comment:        IF HEPARIN RESULTS ARE BELOW EXPECTED VALUES, AND PATIENT DOSAGE HAS BEEN CONFIRMED, SUGGEST FOLLOW UP TESTING OF ANTITHROMBIN III LEVELS.   CBC     Status: Abnormal   Collection Time:  07/04/14 12:40 AM  Result Value Ref Range   WBC 9.6 4.0 - 10.5 K/uL   RBC 3.93 (L) 4.22 - 5.81 MIL/uL   Hemoglobin 11.8 (L) 13.0 - 17.0 g/dL   HCT 35.7 (L) 39.0 - 52.0 %   MCV 90.8 78.0 - 100.0 fL   MCH 30.0 26.0 - 34.0 pg   MCHC 33.1 30.0 - 36.0 g/dL   RDW 14.4 11.5 - 15.5 %   Platelets 318 150 - 400 K/uL  Troponin I     Status: Abnormal   Collection Time: 07/04/14 12:40 AM  Result Value Ref Range   Troponin I 5.06 (HH) <0.30 ng/mL    Comment:        Due to the release kinetics of cTnI, a negative result within the first hours of the onset of symptoms does not rule out myocardial infarction with certainty. If myocardial infarction is still suspected, repeat the test at appropriate intervals. CRITICAL VALUE NOTED.  VALUE IS CONSISTENT WITH PREVIOUSLY REPORTED AND CALLED VALUE.   Comprehensive metabolic panel     Status: Abnormal   Collection Time: 07/04/14 12:40 AM  Result Value Ref Range   Sodium 140 137 - 147 mEq/L   Potassium 4.0 3.7 - 5.3 mEq/L   Chloride 100 96 - 112 mEq/L   CO2 27 19 - 32 mEq/L   Glucose, Bld 206 (H) 70 - 99 mg/dL   BUN 22 6 - 23 mg/dL   Creatinine, Ser 1.24 0.50 - 1.35 mg/dL   Calcium 9.4 8.4 - 10.5 mg/dL   Total Protein 6.5 6.0 - 8.3 g/dL   Albumin 3.5 3.5 - 5.2 g/dL   AST 43 (H) 0 - 37 U/L   ALT 32 0 - 53 U/L   Alkaline Phosphatase 87 39 - 117 U/L   Total Bilirubin 0.6 0.3 - 1.2 mg/dL   GFR calc non Af Amer 67 (L) >90 mL/min   GFR calc Af Amer 78 (L) >90 mL/min    Comment: (NOTE) The eGFR has been calculated using the CKD EPI equation. This calculation has not been validated in all clinical situations. eGFR's persistently <90 mL/min signify possible Chronic Kidney Disease.    Anion gap 13 5 - 15  Hemoglobin A1c     Status: Abnormal   Collection Time: 07/04/14 12:40 AM  Result Value Ref Range   Hgb A1c MFr Bld 6.6 (H) <5.7 %    Comment: (NOTE)                                                                       According to the ADA  Clinical Practice Recommendations for 2011, when HbA1c is  used as a screening test:  >=6.5%   Diagnostic of Diabetes Mellitus           (if abnormal result is confirmed) 5.7-6.4%   Increased risk of developing Diabetes Mellitus References:Diagnosis and Classification of Diabetes Mellitus,Diabetes TJQZ,0092,33(AQTMA 1):S62-S69 and Standards of Medical Care in         Diabetes - 2011,Diabetes UQJF,3545,62 (Suppl 1):S11-S61.    Mean Plasma Glucose 143 (H) <117 mg/dL    Comment: Performed at [facility]  Lipid panel     Status: None   Collection Time: 07/04/14 12:40 AM  Result Value Ref Range   Cholesterol 143 0 - 200 mg/dL   Triglycerides 112 <150 mg/dL   HDL 58 >39 mg/dL   Total CHOL/HDL Ratio 2.5 RATIO   VLDL 22 0 - 40 mg/dL   LDL Cholesterol 63 0 - 99 mg/dL    Comment:        Total Cholesterol/HDL:CHD Risk Coronary Heart Disease Risk Table                     Men   Women  1/2 Average Risk   3.4   3.3  Average Risk       5.0   4.4  2 X Average Risk   9.6   7.1  3 X Average Risk  23.4   11.0        Use the calculated Patient Ratio above and the CHD Risk Table to determine the patient's CHD Risk.        ATP III CLASSIFICATION (LDL):  <100     mg/dL   Optimal  100-129  mg/dL   Near or Above                    Optimal  130-159  mg/dL   Borderline  160-189  mg/dL   High  >190     mg/dL   Very High   Glucose, capillary     Status: Abnormal   Collection Time: 07/04/14  4:47 AM  Result Value Ref Range   Glucose-Capillary 180 (H) 70 - 99 mg/dL  Protime-INR     Status: None   Collection Time: 07/04/14  5:20 AM  Result Value Ref Range   Prothrombin Time 14.7 11.6 - 15.2 seconds   INR 1.14 0.00 - 1.49  Glucose, capillary     Status: Abnormal   Collection Time: 07/04/14  7:48 AM  Result Value Ref Range   Glucose-Capillary 171 (H) 70 - 99 mg/dL    Imaging: Dg Chest 2 View  07/03/2014   CLINICAL DATA:  Cough and shortness of breath with history of coronary artery  disease. CHF and renal insufficiency  EXAM: CHEST  2 VIEW  COMPARISON:  Portable chest x-ray of March 31, 2014  FINDINGS: The lungs are adequately inflated. There is no focal infiltrate. The interstitial markings are increased bilaterally. The pulmonary vascularity is mildly engorged. The cardiac silhouette is enlarged. There is no pleural effusion or pneumothorax. here is stable anterior wedge compression of the body of T11.  IMPRESSION: Congestive heart failure with mild pulmonary interstitial edema. There is no pleural effusion.   Electronically Signed   By: David  Martinique   On: 07/03/2014 15:47    Assessment:  1. Principal Problem: 2.   NSTEMI (non-ST elevated myocardial infarction) 3. Active Problems: 4.   Type 1 diabetes mellitus 5.   Acute on chronic systolic congestive heart failure, NYHA class 4 6.  Diabetic hyperosmolar non-ketotic state 7.   CAD, multiple vessel 8.   Hyperkalemia 9.   Plan:  1. NSTEMI: plan for LHC today, he is agreeable to this. D/W Dr. Roxy Manns, he will see regarding possible CABG after his LHC today. Keep NPO. 2D echo scheduled - he is now agreeable to this. 2. DM1 - per medicine team. BG's improved 3. Acute systolic congestive heart failure - diuresing well, will need to continue diuretics 4. Hyperkalemia - resolved  Time Spent Directly with Patient:   60 minutes  Length of Stay:  LOS: 1 day   Pixie Casino, MD, [facility] Attending Cardiologist [facility]  HILTY,Kenneth C 07/04/2014, 11:47 AM

## 2014-07-04 NOTE — Progress Notes (Signed)
Pt c/o dry cough, unable to sleep d/t persistent cough.  Rales noted in mid and lower lobes bilaterally.  VSS.  Dyspneic on exertion.  Occasional expiratory wheezing also noted.  PA notified.  Order received and implemented for 40 mg IV lasix.  Will continue to monitor.  Alonza Bogus

## 2014-07-04 NOTE — Progress Notes (Addendum)
Inpatient Diabetes Program Recommendations  AACE/ADA: New Consensus Statement on Inpatient Glycemic Control (2013)  Target Ranges:  Prepandial:   less than 140 mg/dL      Peak postprandial:   less than 180 mg/dL (1-2 hours)      Critically ill patients:  140 - 180 mg/dL    Results for Reginald Gutierrez, [facility] (MRN [id number]) as of 07/04/2014 07:34  Ref. Range 07/03/2014 18:23 07/03/2014 20:21 07/03/2014 21:24 07/03/2014 22:35 07/04/2014 00:14 07/04/2014 04:47  Glucose-Capillary Latest Range: 70-99 mg/dL 703 (H) 403 (H) 524 (H) 217 (H) 199 (H) 180 (H)    Results for [name], [name] (MRN [id number]) as of 07/04/2014 07:34  Ref. Range 08/15/2013 14:54 01/19/2014 11:57 07/03/2014 21:36 07/04/2014 00:40  Hgb A1c MFr Bld Latest Range: <5.7 % 6.7 (H) 7.8 (H) 6.7 (H) 6.6 (H)     Admitted with NSTEMI and Hyperglycemia.  History of DM, CAD, MI.    Per MD notes, patient was ambiguous about whether or not he used cocaine and marijuana.  Refused UDS.   Home DM Meds: Lantus 15 units QAM       Regular insulin (1 unit for every 15 grams of carbs eaten & 1 unit for every 100 mg/dl above CBG of 121 mg/dl.    Current Insulin Orders: Lantus 18 units QHS (started at midnight)      Novolog Moderate SSI tid ac + HS    **Note patient was started on IV insulin drip last night at 7pm.  Transitioned off IV insulin drip at 10:30pm.  Patient was started on Lantus 18 units QHS at midnight.  **Of note, A1c results show decent glucose control at home since December 2014 despite the fact that patient's glucose was 513 mg/dl on admission.   Addendum 1320pm: Spoke with patient this AM.  Reviewed his current A1c results with him.  Explained to patient that his MI may be the likely culprit of why his glucose was so elevated this admission.  Patient told me he sees Dr. Romero Belling (with [facility]) and also has seen Dr. Manson Passey at the [facility].  Patient stated both physicians have helped him with  his blood sugar control at home but that he really likes Dr. Elisabeth Pigeon and wants to see her more often.  Patient told me he has been having elevated glucose levels in the morning at home (sometimes 300's) and that he has to give himself a lot of Novolog to bring his CBGs down.  Patient also told me he will give himself more Novolog than he should and then he will have hypoglycemia in the afternoon.  Asked patient why he is giving himself too much Novolog and patient replied that he wants to have "good CBGs".  Explained to patient that he should not over-bolus himself with Novolog as this is a dangerous practice.  Encouraged patient to stop this practice.  Also advised patient to re-visit with his MD to see if he may need more Lantus at home since his fasting sugars have been elevated.   Will follow Ambrose Finland RN, MSN, CDE Diabetes Coordinator Inpatient Diabetes Program Team Pager: [phone] (8a-10p)

## 2014-07-04 NOTE — Plan of Care (Signed)
Problem: Consults Goal: Diabetes Guidelines if Diabetic/Glucose > 140 If diabetic or lab glucose is > 140 mg/dl - Initiate Diabetes/Hyperglycemia Guidelines & Document Interventions  Outcome: Completed/Met Date Met:  07/04/14  Problem: Phase I Progression Outcomes Goal: Hemodynamically stable Outcome: Completed/Met Date Met:  07/04/14 Goal: Anginal pain relieved Outcome: Completed/Met Date Met:  07/04/14 Goal: MD aware of Cardiac Marker results Outcome: Completed/Met Date Met:  07/04/14 Goal: Voiding-avoid urinary catheter unless indicated Outcome: Completed/Met Date Met:  07/04/14     

## 2014-07-04 NOTE — Plan of Care (Signed)
Problem: Phase I Progression Outcomes Goal: Order "Choose to Live" book from Pharmacy Outcome: Completed/Met Date Met:  07/04/14

## 2014-07-04 NOTE — Plan of Care (Signed)
Problem: Phase I Progression Outcomes Goal: Diabetes Coordinator Consult Outcome: Completed/Met Date Met:  07/04/14

## 2014-07-04 NOTE — Progress Notes (Signed)
ANTICOAGULATION CONSULT NOTE - Follow Up Consult  Pharmacy Consult for Heparin Indication: chest pain/ACS  No Known Allergies  Patient Measurements: Height: 5' 6.14" (168 cm) Weight: 172 lb 11.2 oz (78.336 kg) IBW/kg (Calculated) : 64.13 Heparin Dosing Weight: 78 kg  Vital Signs: Temp: 98.7 F (37.1 C) (11/04 0812) Temp Source: Oral (11/04 0812) BP: 140/41 mmHg (11/04 1555) Pulse Rate: 42 (11/04 1555)  Labs:  Recent Labs  07/03/14 1453 07/03/14 2136 07/04/14 0040 07/04/14 0520 07/04/14 1100 07/04/14 1155  HGB 13.8 13.0 11.8*  --   --   --   HCT 41.6 38.8* 35.7*  --   --   --   PLT 357 315 318  --   --   --   LABPROT  --   --   --  14.7  --   --   INR  --   --   --  1.14  --   --   HEPARINUNFRC  --   --  0.15*  --   --  0.30  CREATININE 1.04 1.17 1.24  --   --   --   TROPONINI 4.98* 5.75* 5.06*  --  2.74*  --     Estimated Creatinine Clearance: 71.9 mL/min (by C-G formula based on Cr of 1.24).  Assessment: 48 yo M started on heparin last PM for chest pain.  Pt has known CAD and has been referred for CABG in the past. He is s/p cath today- still with severe 3-vessel disease. He has refused in the past, and surgical revascularization is being contemplated.  To resume heparin 8 hours post sheath removal. Sheath removed at 1600 this afternoon. Heparin level was 0.3 on 1000 units/hr earlier today.  No overt bleeding noted.  Goal of Therapy:  Heparin level 0.3-0.7 units/ml Monitor platelets by anticoagulation protocol: Yes   Plan:  1. Increase heparin to 1100 units/hr- to start at midnight 2. First heparin level with AM labs 3. Daily heparin level and CBC 4. Follow for CABG plans  Devean Skoczylas D. Acire Tang, PharmD, BCPS Clinical Pharmacist Pager: [phone] 07/04/2014 4:41 PM

## 2014-07-04 NOTE — Progress Notes (Signed)
Site area: RFA Site Prior to Removal:  Level0  Pressure Applied For:30 Manual: yes   Patient Status During Pull:  stable Post Pull Site:  Level Post Pull Instructions Given:  y Post Pull Pulses Present: palpable Dressing Applied:  clear Bedrest begins @ 1600 Comments:no complications

## 2014-07-04 NOTE — Progress Notes (Signed)
Pt refusing am labs, stating he wants to wait until he has talked to the cardiologist.  Reginald Gutierrez

## 2014-07-04 NOTE — Progress Notes (Signed)
ANTICOAGULATION CONSULT NOTE - Follow Up Consult  Pharmacy Consult for Heparin Indication: chest pain/ACS  No Known Allergies  Patient Measurements: Height: 5' 6.14" (168 cm) Weight: 172 lb 11.2 oz (78.336 kg) IBW/kg (Calculated) : 64.13 Heparin Dosing Weight: 78 kg  Vital Signs: Temp: 98.7 F (37.1 C) (11/04 0812) Temp Source: Oral (11/04 0812) BP: 101/62 mmHg (11/04 0812) Pulse Rate: 45 (11/04 0812)  Labs:  Recent Labs  07/03/14 1453 07/03/14 2136 07/04/14 0040 07/04/14 0520 07/04/14 1100 07/04/14 1155  HGB 13.8 13.0 11.8*  --   --   --   HCT 41.6 38.8* 35.7*  --   --   --   PLT 357 315 318  --   --   --   LABPROT  --   --   --  14.7  --   --   INR  --   --   --  1.14  --   --   HEPARINUNFRC  --   --  0.15*  --   --  0.30  CREATININE 1.04 1.17 1.24  --   --   --   TROPONINI 4.98* 5.75* 5.06*  --  2.74*  --     Estimated Creatinine Clearance: 71.9 mL/min (by C-G formula based on Cr of 1.24).   Medications:  Infusions:  . sodium chloride 10 mL/hr at 07/03/14 2030  . heparin 1,000 Units/hr (07/04/14 1318)  . insulin (NOVOLIN-R) infusion      Assessment: 48 yo M started on heparin last PM for chest pain.  Pt has known CAD and has been referred for CABG in the past.  Earlier today, pt was refusing labs but is now more amendable to treatment.  Heparin level 0.3 on 1000 units/hr.  Pt at low-end of goal range but will not adjust rate at this time since pt has plans for cardiac cath later today.  Goal of Therapy:  Heparin level 0.3-0.7 units/ml Monitor platelets by anticoagulation protocol: Yes   Plan:  Continue heparin at 1000 units/hr. Follow-up plans after cardiac cath.  [employer], Pharm.D., BCPS Clinical Pharmacist Pager [phone] 07/04/2014 1:33 PM

## 2014-07-04 NOTE — Progress Notes (Signed)
PROGRESS NOTE    Reginald Gutierrez WJX:[id number] DOB: Apr 17, 1966 DOA: 07/03/2014 PCP: No PCP Per Patient  HPI/Brief narrative 48 year old male with history of 3 V CAD, MI 2013, IDDM, chronic systolic CHF, tobacco abuse, substance abuse, HTN, evaluated extensively in the recent past by heart cath in 2015 after he developed progressive symptoms of chest pain and dyspnea and had high risk myocardial perfusion study, EF 35%, CABG recommended but patient undecided, now presented with history of dyspnea, orthopnea & hypoxia. He responded well to Lasix. Lab work showed troponin 4.98 and HONK picture. Chest x-ray was consistent with CHF. Cardiology was consulted.   Assessment/Plan:  1. NSTEMI: cardiology was consulted and patient underwent left heart cath which showed severe three-vessel disease with total occlusion of distal RCA, high-grade and diffuse obstruction in the proximal to mid LAD, high-grade obstruction and a large diagonal, high-grade obstruction in the proximal circumflex/obtuse marginal-no significant changes compared to July 2015 cath. As per cardiology, surgery/CABG would appear to be the best option. Patient apparently contemplating. TCTS in put appreciated 2. Uncontrolled type II DM with hyperosmolar state: Initially treated with insulin drip and after stabilized was transitioned to Lantus and SSI. Adjust meds as needed. 3. Hyperkalemia: resolved. 4. Hypothyroidism: Synthroid. 5. Acute systolic CHF/ischemic cardiomyopathy: Improved Lasix in ED.Consider Lasix when necessary.   Code Status: Full Family Communication: None at bedside Disposition Plan: Home when medically stable   Consultants:  Cardiology  TCTS  Procedures:  LHC  Antibiotics:  None   Subjective: Denies dyspnea. States that he feels 100% better. Patient was seen before heart cath this morning.  Objective: Filed Vitals:   07/04/14 1540 07/04/14 1545 07/04/14 1550 07/04/14 1555  BP: 130/46 106/47 124/53  140/41  Pulse: 45 85 56 42  Temp:      TempSrc:      Resp: 23 18 21 17   Height:      Weight:      SpO2: 95% 99% 96% 96%  oxygen saturation 96%.  Intake/Output Summary (Last 24 hours) at 07/04/14 1801 Last data filed at 07/04/14 0600  Gross per 24 hour  Intake 544.67 ml  Output   1675 ml  Net -1130.33 ml   Filed Weights   07/03/14 1547 07/04/14 0450  Weight: 78 kg (171 lb 15.3 oz) 78.336 kg (172 lb 11.2 oz)     Exam:  General exam: pleasant young male lying comfortably supine in bed. Respiratory system: Clear. No increased work of breathing. Cardiovascular system: S1 & S2 heard, RRR. No JVD, murmurs, gallops, clicks or pedal edema. Telemetry:  Sinus rhythm with frequent PVCs. Gastrointestinal system: Abdomen is nondistended, soft and nontender. Normal bowel sounds heard. Central nervous system: Alert and oriented. No focal neurological deficits. Extremities: Symmetric 5 x 5 power.   Data Reviewed: Basic Metabolic Panel:  Recent Labs Lab 07/03/14 1453 07/03/14 2136 07/04/14 0040  NA 131* 139 140  K 7.1* 4.4 4.0  CL 93* 97 100  CO2 23 27 27   GLUCOSE 513* 295* 206*  BUN 22 22 22   CREATININE 1.04 1.17 1.24  CALCIUM 9.4 9.9 9.4  MG  --  1.9  --    Liver Function Tests:  Recent Labs Lab 07/03/14 1453 07/04/14 0040  AST 55* 43*  ALT 39 32  ALKPHOS 101 87  BILITOT 0.8 0.6  PROT 7.7 6.5  ALBUMIN 4.1 3.5    Recent Labs Lab 07/03/14 1453  LIPASE 10*   No results for input(s): AMMONIA in the last 168 hours.  CBC:  Recent Labs Lab 07/03/14 1453 07/03/14 2136 07/04/14 0040  WBC 14.0* 11.0* 9.6  NEUTROABS 10.9* 7.1  --   HGB 13.8 13.0 11.8*  HCT 41.6 38.8* 35.7*  MCV 92.0 91.1 90.8  PLT 357 315 318   Cardiac Enzymes:  Recent Labs Lab 07/03/14 1453 07/03/14 2136 07/04/14 0040 07/04/14 1100  TROPONINI 4.98* 5.75* 5.06* 2.74*   BNP (last 3 results)  Recent Labs  07/03/14 1453  PROBNP 10357.0*   CBG:  Recent Labs Lab 07/04/14 0447  07/04/14 0748 07/04/14 1228 07/04/14 1526 07/04/14 1634  GLUCAP 180* 171* 125* 295* 309*    Recent Results (from the past 240 hour(s))  MRSA PCR Screening     Status: None   Collection Time: 07/03/14  9:40 PM  Result Value Ref Range Status   MRSA by PCR NEGATIVE NEGATIVE Final    Comment:        The GeneXpert MRSA Assay (FDA approved for NASAL specimens only), is one component of a comprehensive MRSA colonization surveillance program. It is not intended to diagnose MRSA infection nor to guide or monitor treatment for MRSA infections.         Studies: Dg Chest 2 View  07/03/2014   CLINICAL DATA:  Cough and shortness of breath with history of coronary artery disease. CHF and renal insufficiency  EXAM: CHEST  2 VIEW  COMPARISON:  Portable chest x-ray of March 31, 2014  FINDINGS: The lungs are adequately inflated. There is no focal infiltrate. The interstitial markings are increased bilaterally. The pulmonary vascularity is mildly engorged. The cardiac silhouette is enlarged. There is no pleural effusion or pneumothorax. here is stable anterior wedge compression of the body of T11.  IMPRESSION: Congestive heart failure with mild pulmonary interstitial edema. There is no pleural effusion.   Electronically Signed   By: David  [place]   On: 07/03/2014 15:47        Scheduled Meds: . ARIPiprazole  5 mg Oral BID  . atorvastatin  40 mg Oral q1800  . DULoxetine  40 mg Oral Daily  . insulin aspart  0-15 Units Subcutaneous TID WC  . insulin aspart  0-5 Units Subcutaneous QHS  . insulin glargine  18 Units Subcutaneous QHS  . levothyroxine  125 mcg Oral QAC breakfast  . lisinopril  5 mg Oral Daily  . Living Better with Heart Failure Book   Does not apply Once  . sodium chloride  3 mL Intravenous Q12H   Continuous Infusions: . sodium chloride 50 mL/hr (07/04/14 1706)  . [START ON 07/05/2014] heparin    . insulin (NOVOLIN-R) infusion      Principal Problem:   NSTEMI (non-ST  elevated myocardial infarction) Active Problems:   Type 1 diabetes mellitus   Acute on chronic systolic congestive heart failure, NYHA class 4   Diabetic hyperosmolar non-ketotic state   CAD, multiple vessel   Hyperkalemia    Time spent: 40 mins    Dalin Caldera, MD, FACP, FHM. [facility] Pager [phone]  If 7PM-7AM, please contact night-coverage [email or web address] Password [email or web address] 07/04/2014, 6:01 PM    LOS: 1 day

## 2014-07-04 NOTE — Plan of Care (Signed)
Problem: Consults Goal: Diabetic Ketoacidosis (DKA) Patient Education See Patient Education Modules for education specifics. Outcome: Progressing Goal: Diabetes Guidelines if Diabetic/Glucose > 140 If diabetic or lab glucose is > 140 mg/dl - Initiate Diabetes/Hyperglycemia Guidelines & Document Interventions  Outcome: Completed/Met Date Met:  07/04/14  Problem: Phase I Progression Outcomes Goal: CBGs steadily decreasing on IV insulin drip Outcome: Completed/Met Date Met:  07/04/14 Goal: Nausea/vomiting controlled with antiemetics Outcome: Not Applicable Date Met:  58/59/29 Goal: Pain controlled with appropriate interventions Outcome: Completed/Met Date Met:  07/04/14 Goal: OOB as tolerated unless otherwise ordered Outcome: Completed/Met Date Met:  07/04/14 Goal: Initial discharge plan identified Outcome: Completed/Met Date Met:  07/04/14 Goal: Voiding-avoid urinary catheter unless indicated Outcome: Completed/Met Date Met:  07/04/14 Goal: Pt. states reason for hospitalization Outcome: Completed/Met Date Met:  07/04/14

## 2014-07-04 NOTE — Progress Notes (Signed)
Chaplain responded to referral that family in need of spiritual support. Chaplain arrived just before pt was taken to cath lab. Pt's mother tearful. Pt requested prayer for his health and for him and his mother. Chaplain prayed with pt and mother. Chaplain escorted pt's mother to waiting area and sat with her for a few minutes. Mother tearfully expressed gratitude for the prayer and said she is hopeful that "it'll be ok." Mother seemed calmer at end of visit. Will follow as needed.     07/04/14 1409  Clinical Encounter Type  Visited With Patient and family together;Health care provider  Visit Type Initial;Spiritual support  Stress Factors  Patient Stress Factors Health changes  Family Stress Factors Exhausted;Health changes    Wille Glaser 07/04/2014 2:13 PM

## 2014-07-04 NOTE — Progress Notes (Signed)
ANTICOAGULATION CONSULT NOTE  Pharmacy Consult for Heparin  Indication: chest pain/ACS  No Known Allergies  Patient Measurements: Height: 5' 6.14" (168 cm) Weight: 171 lb 15.3 oz (78 kg) (From 06/04/14) IBW/kg (Calculated) : 64.13 Heparin Dosing Weight: 78 kg  Vital Signs: Temp: 99 F (37.2 C) (11/04 0016) Temp Source: Oral (11/04 0016) BP: 100/87 mmHg (11/04 0016) Pulse Rate: 90 (11/04 0016)  Labs:  Recent Labs  07/03/14 1453 07/03/14 2136 07/04/14 0040  HGB 13.8 13.0 11.8*  HCT 41.6 38.8* 35.7*  PLT 357 315 318  HEPARINUNFRC  --   --  0.15*  CREATININE 1.04 1.17 1.24  TROPONINI 4.98* 5.75* 5.06*    Estimated Creatinine Clearance: 71.8 mL/min (by C-G formula based on Cr of 1.24).  Assessment: 48 yo male with SOB/elevated cardiac markers, for heparin.  Heparin infusion had become disconnected from line earlier in the night per RN  Goal of Therapy:  Heparin level 0.3-0.7 units/ml Monitor platelets by anticoagulation protocol: Yes   Plan:  -Heparin 2000 units IV bolus, then increase heparin 1000 units/hr Follow-up am labs.  Geannie Risen, PharmD, BCPS

## 2014-07-05 ENCOUNTER — Inpatient Hospital Stay (HOSPITAL_COMMUNITY): Payer: Medicare Other

## 2014-07-05 DIAGNOSIS — E11649 Type 2 diabetes mellitus with hypoglycemia without coma: Secondary | ICD-10-CM

## 2014-07-05 DIAGNOSIS — I517 Cardiomegaly: Secondary | ICD-10-CM

## 2014-07-05 DIAGNOSIS — F419 Anxiety disorder, unspecified: Secondary | ICD-10-CM

## 2014-07-05 DIAGNOSIS — Z01810 Encounter for preprocedural cardiovascular examination: Secondary | ICD-10-CM

## 2014-07-05 LAB — PULMONARY FUNCTION TEST
DL/VA % pred: 152 %
DL/VA: 6.31 ml/min/mmHg/L
DLCO UNC: 16.06 ml/min/mmHg
DLCO cor % pred: 76 %
DLCO cor: 17.56 ml/min/mmHg
DLCO unc % pred: 70 %
FEF 25-75 POST: 0.59 L/s
FEF 25-75 Pre: 0.56 L/sec
FEF2575-%Change-Post: 6 %
FEF2575-%PRED-PRE: 18 %
FEF2575-%Pred-Post: 20 %
FEV1-%Change-Post: 3 %
FEV1-%PRED-PRE: 32 %
FEV1-%Pred-Post: 33 %
FEV1-POST: 1.05 L
FEV1-PRE: 1.01 L
FEV1FVC-%CHANGE-POST: 6 %
FEV1FVC-%PRED-PRE: 81 %
FEV6-%CHANGE-POST: 0 %
FEV6-%PRED-PRE: 40 %
FEV6-%Pred-Post: 40 %
FEV6-Post: 1.56 L
FEV6-Pre: 1.55 L
FEV6FVC-%Change-Post: 2 %
FEV6FVC-%PRED-POST: 104 %
FEV6FVC-%Pred-Pre: 101 %
FVC-%Change-Post: -1 %
FVC-%PRED-POST: 39 %
FVC-%Pred-Pre: 39 %
FVC-Post: 1.56 L
FVC-Pre: 1.59 L
POST FEV1/FVC RATIO: 67 %
Post FEV6/FVC ratio: 100 %
Pre FEV1/FVC ratio: 63 %
Pre FEV6/FVC Ratio: 97 %
RV % pred: 88 %
RV: 1.43 L
TLC % pred: 65 %
TLC: 3.63 L

## 2014-07-05 LAB — CBC
HEMATOCRIT: 34.1 % — AB (ref 39.0–52.0)
Hemoglobin: 11.3 g/dL — ABNORMAL LOW (ref 13.0–17.0)
MCH: 30.4 pg (ref 26.0–34.0)
MCHC: 33.1 g/dL (ref 30.0–36.0)
MCV: 91.7 fL (ref 78.0–100.0)
Platelets: 268 10*3/uL (ref 150–400)
RBC: 3.72 MIL/uL — AB (ref 4.22–5.81)
RDW: 14.4 % (ref 11.5–15.5)
WBC: 8.1 10*3/uL (ref 4.0–10.5)

## 2014-07-05 LAB — COMPREHENSIVE METABOLIC PANEL
ALT: 26 U/L (ref 0–53)
ANION GAP: 12 (ref 5–15)
AST: 25 U/L (ref 0–37)
Albumin: 3.3 g/dL — ABNORMAL LOW (ref 3.5–5.2)
Alkaline Phosphatase: 87 U/L (ref 39–117)
BUN: 21 mg/dL (ref 6–23)
CALCIUM: 8.5 mg/dL (ref 8.4–10.5)
CO2: 24 meq/L (ref 19–32)
CREATININE: 1.08 mg/dL (ref 0.50–1.35)
Chloride: 100 mEq/L (ref 96–112)
GFR, EST NON AFRICAN AMERICAN: 79 mL/min — AB (ref >90)
Glucose, Bld: 301 mg/dL — ABNORMAL HIGH (ref 70–99)
Potassium: 4.7 mEq/L (ref 3.7–5.3)
Sodium: 136 mEq/L — ABNORMAL LOW (ref 137–147)
Total Bilirubin: 0.4 mg/dL (ref 0.3–1.2)
Total Protein: 6.6 g/dL (ref 6.0–8.3)

## 2014-07-05 LAB — TYPE AND SCREEN
ABO/RH(D): A POS
ANTIBODY SCREEN: NEGATIVE

## 2014-07-05 LAB — LIPID PANEL
CHOLESTEROL: 147 mg/dL (ref 0–200)
HDL: 52 mg/dL (ref >39)
LDL Cholesterol: 69 mg/dL (ref 0–99)
Total CHOL/HDL Ratio: 2.8 RATIO
Triglycerides: 128 mg/dL (ref <150)
VLDL: 26 mg/dL (ref 0–40)

## 2014-07-05 LAB — BLOOD GAS, ARTERIAL
ACID-BASE EXCESS: 2.4 mmol/L — AB (ref 0.0–2.0)
Bicarbonate: 26.3 mEq/L — ABNORMAL HIGH (ref 20.0–24.0)
DRAWN BY: 347621
FIO2: 0.21 %
O2 Saturation: 92.5 %
Patient temperature: 98.6
TCO2: 27.6 mmol/L (ref 0–100)
pCO2 arterial: 40.6 mmHg (ref 35.0–45.0)
pH, Arterial: 7.428 (ref 7.350–7.450)
pO2, Arterial: 69.8 mmHg — ABNORMAL LOW (ref 80.0–100.0)

## 2014-07-05 LAB — HEPARIN LEVEL (UNFRACTIONATED): Heparin Unfractionated: 0.38 IU/mL (ref 0.30–0.70)

## 2014-07-05 LAB — PRO B NATRIURETIC PEPTIDE: Pro B Natriuretic peptide (BNP): 821.2 pg/mL — ABNORMAL HIGH (ref 0–125)

## 2014-07-05 LAB — GLUCOSE, CAPILLARY
GLUCOSE-CAPILLARY: 138 mg/dL — AB (ref 70–99)
Glucose-Capillary: 21 mg/dL — CL (ref 70–99)
Glucose-Capillary: 30 mg/dL — CL (ref 70–99)
Glucose-Capillary: 60 mg/dL — ABNORMAL LOW (ref 70–99)
Glucose-Capillary: 355 mg/dL — ABNORMAL HIGH (ref 70–99)
Glucose-Capillary: 158 mg/dL — ABNORMAL HIGH (ref 70–99)
Glucose-Capillary: 57 mg/dL — ABNORMAL LOW (ref 70–99)
Glucose-Capillary: 240 mg/dL — ABNORMAL HIGH (ref 70–99)
Glucose-Capillary: 222 mg/dL — ABNORMAL HIGH (ref 70–99)

## 2014-07-05 LAB — HEMOGLOBIN A1C
HEMOGLOBIN A1C: 6.5 % — AB (ref <5.7)
Mean Plasma Glucose: 140 mg/dL — ABNORMAL HIGH (ref <117)

## 2014-07-05 MED ORDER — ALPRAZOLAM 0.5 MG PO TABS
0.5000 mg | ORAL_TABLET | Freq: Three times a day (TID) | ORAL | Status: DC | PRN
  Administered 2014-07-05: 0.5 mg via ORAL
  Filled 2014-07-05: qty 1

## 2014-07-05 MED ORDER — POTASSIUM CHLORIDE 2 MEQ/ML IV SOLN
80.0000 meq | INTRAVENOUS | Status: DC
  Filled 2014-07-05: qty 40

## 2014-07-05 MED ORDER — CHLORHEXIDINE GLUCONATE 4 % EX LIQD
60.0000 mL | Freq: Once | CUTANEOUS | Status: AC
  Administered 2014-07-06: 4 via TOPICAL
  Filled 2014-07-05: qty 60

## 2014-07-05 MED ORDER — SODIUM CHLORIDE 0.9 % IV SOLN
INTRAVENOUS | Status: DC
  Filled 2014-07-05: qty 40

## 2014-07-05 MED ORDER — BISACODYL 5 MG PO TBEC
5.0000 mg | DELAYED_RELEASE_TABLET | Freq: Once | ORAL | Status: AC
  Administered 2014-07-05: 5 mg via ORAL
  Filled 2014-07-05: qty 1

## 2014-07-05 MED ORDER — VANCOMYCIN HCL 10 G IV SOLR
1250.0000 mg | INTRAVENOUS | Status: AC
  Administered 2014-07-06: 1250 mg via INTRAVENOUS
  Filled 2014-07-05: qty 1250

## 2014-07-05 MED ORDER — CEFUROXIME SODIUM 750 MG IJ SOLR
750.0000 mg | INTRAMUSCULAR | Status: DC
  Filled 2014-07-05: qty 750

## 2014-07-05 MED ORDER — MAGNESIUM SULFATE 50 % IJ SOLN
40.0000 meq | INTRAMUSCULAR | Status: DC
  Filled 2014-07-05: qty 10

## 2014-07-05 MED ORDER — ALBUTEROL SULFATE (2.5 MG/3ML) 0.083% IN NEBU
2.5000 mg | INHALATION_SOLUTION | Freq: Once | RESPIRATORY_TRACT | Status: AC
  Administered 2014-07-05: 2.5 mg via RESPIRATORY_TRACT

## 2014-07-05 MED ORDER — SODIUM CHLORIDE 0.9 % IV SOLN
INTRAVENOUS | Status: DC
  Filled 2014-07-05: qty 30

## 2014-07-05 MED ORDER — NITROGLYCERIN IN D5W 200-5 MCG/ML-% IV SOLN
2.0000 ug/min | INTRAVENOUS | Status: AC
Start: 2014-07-06 — End: 2014-07-06
  Administered 2014-07-06: 10 ug/min via INTRAVENOUS
  Filled 2014-07-05: qty 250

## 2014-07-05 MED ORDER — METOPROLOL TARTRATE 12.5 MG HALF TABLET
12.5000 mg | ORAL_TABLET | Freq: Once | ORAL | Status: AC
  Administered 2014-07-06: 12.5 mg via ORAL
  Filled 2014-07-05: qty 1

## 2014-07-05 MED ORDER — LORAZEPAM 1 MG PO TABS: 2.0000 mg | ORAL_TABLET | Freq: Three times a day (TID) | ORAL | Status: DC | PRN

## 2014-07-05 MED ORDER — DEXMEDETOMIDINE HCL IN NACL 400 MCG/100ML IV SOLN
0.1000 ug/kg/h | INTRAVENOUS | Status: AC
Start: 2014-07-06 — End: 2014-07-06
  Administered 2014-07-06: 0.2 ug/kg/h via INTRAVENOUS
  Filled 2014-07-05: qty 100

## 2014-07-05 MED ORDER — INSULIN GLARGINE 100 UNIT/ML ~~LOC~~ SOLN
10.0000 [IU] | Freq: Every day | SUBCUTANEOUS | Status: DC
  Administered 2014-07-05: 10 [IU] via SUBCUTANEOUS
  Filled 2014-07-05 (×2): qty 0.1

## 2014-07-05 MED ORDER — PLASMA-LYTE 148 IV SOLN
INTRAVENOUS | Status: AC
  Administered 2014-07-06: 500 mL
  Filled 2014-07-05: qty 2.5

## 2014-07-05 MED ORDER — DEXTROSE 5 % IV SOLN
1.5000 g | INTRAVENOUS | Status: AC
  Administered 2014-07-06: .75 g via INTRAVENOUS
  Administered 2014-07-06: 1.5 g via INTRAVENOUS
  Filled 2014-07-05: qty 1.5

## 2014-07-05 MED ORDER — DOPAMINE-DEXTROSE 3.2-5 MG/ML-% IV SOLN
0.0000 ug/kg/min | INTRAVENOUS | Status: DC
  Filled 2014-07-05: qty 250

## 2014-07-05 MED ORDER — INSULIN ASPART 100 UNIT/ML ~~LOC~~ SOLN
0.0000 [IU] | Freq: Three times a day (TID) | SUBCUTANEOUS | Status: DC
Start: 2014-07-06 — End: 2014-07-06

## 2014-07-05 MED ORDER — EPINEPHRINE HCL 1 MG/ML IJ SOLN
0.5000 ug/min | INTRAVENOUS | Status: DC
  Filled 2014-07-05: qty 4

## 2014-07-05 MED ORDER — SODIUM CHLORIDE 0.9 % IV SOLN
INTRAVENOUS | Status: AC
  Administered 2014-07-06: 1000 mL
  Filled 2014-07-05: qty 1000

## 2014-07-05 MED ORDER — SODIUM CHLORIDE 0.9 % IV SOLN
INTRAVENOUS | Status: AC
  Administered 2014-07-06: 6.1 [IU]/h via INTRAVENOUS
  Filled 2014-07-05: qty 2.5

## 2014-07-05 MED ORDER — TEMAZEPAM 15 MG PO CAPS: 15.0000 mg | ORAL_CAPSULE | Freq: Once | ORAL | Status: AC | PRN

## 2014-07-05 MED ORDER — SODIUM CHLORIDE 0.9 % IV SOLN
INTRAVENOUS | Status: DC
  Filled 2014-07-05: qty 2.5

## 2014-07-05 MED ORDER — PHENYLEPHRINE HCL 10 MG/ML IJ SOLN
30.0000 ug/min | INTRAVENOUS | Status: AC
  Administered 2014-07-06: 15 ug/min via INTRAVENOUS
  Filled 2014-07-05: qty 2

## 2014-07-05 MED ORDER — LORAZEPAM 1 MG PO TABS
2.0000 mg | ORAL_TABLET | Freq: Three times a day (TID) | ORAL | Status: DC
  Administered 2014-07-05: 2 mg via ORAL
  Filled 2014-07-05: qty 2

## 2014-07-05 NOTE — Progress Notes (Signed)
   07/05/14 1125  Clinical Encounter Type  Visited With Patient and family together  Visit Type Follow-up  Spiritual Encounters  Spiritual Needs Prayer    Chaplain paid brief visit to follow up with pt and mother Reginald Gutierrez. Both seemed in good spirits, saying procedure yesterday went well. They requested prayer for pt's surgery tomorrow, and chaplain prayed for pt that this surgery will go well and help him move towards wellness, and for support for pt and family. Pt and Glenda expressed gratitude for prayer. Will follow as needed.  Reginald Gutierrez  07/05/2014 11:27 AM

## 2014-07-05 NOTE — Progress Notes (Signed)
  Echocardiogram 2D Echocardiogram has been performed.  Leta Jungling M 07/05/2014, 9:24 AM

## 2014-07-05 NOTE — Progress Notes (Signed)
ANTICOAGULATION CONSULT NOTE - Follow Up Consult  Pharmacy Consult for Heparin Indication: chest pain/ACS  No Known Allergies  Patient Measurements: Height: 5' 6.14" (168 cm) Weight: 173 lb 1.6 oz (78.518 kg) IBW/kg (Calculated) : 64.13 Heparin Dosing Weight: 78 kg  Vital Signs: Temp: 97.8 F (36.6 C) (11/05 0747) Temp Source: Oral (11/05 0747) BP: 126/53 mmHg (11/05 0747) Pulse Rate: 74 (11/05 0747)  Labs:  Recent Labs  07/03/14 2136 07/04/14 0040 07/04/14 0520 07/04/14 1100 07/04/14 1155 07/05/14 1342  HGB 13.0 11.8*  --   --   --  11.3*  HCT 38.8* 35.7*  --   --   --  34.1*  PLT 315 318  --   --   --  268  LABPROT  --   --  14.7  --   --   --   INR  --   --  1.14  --   --   --   HEPARINUNFRC  --  0.15*  --   --  0.30 0.38  CREATININE 1.17 1.24  --   --   --  1.08  TROPONINI 5.75* 5.06*  --  2.74*  --   --     Estimated Creatinine Clearance: 82.7 mL/min (by C-G formula based on Cr of 1.08).  Assessment: 48 yo M started on heparin 11/3 for chest pain.  Pt has known CAD and has been referred for CABG in the past. He is s/p cath 11/4 - still with severe 3-vessel disease. Pt is more agreeable to surgery at this time.  Surgery planned for 11/6.  Heparin to stop at midnight.  Heparin level therapeutic on 1100 units/ hr.  Goal of Therapy:  Heparin level 0.3-0.7 units/ml Monitor platelets by anticoagulation protocol: Yes   Plan:  Continue heparin at 1100 units/hr Heparin to stop at midnight. Will d/c AM heparin level. Follow-up after CABG.  [employer], Pharm.D., BCPS Clinical Pharmacist Pager [phone] 07/05/2014 3:47 PM

## 2014-07-05 NOTE — Progress Notes (Addendum)
Pre-op Cardiac Surgery  Carotid Findings: Study was technically difficult due to patient affect and behavior.  Findings suggest 1-39% internal carotid artery stenosis bilaterally. Vertebral arteries are patent with antegrade flow.  [name], RDCS, RDMS, RVT  Upper Extremity Right Left  Brachial Pressures 120 118  Radial Waveforms Tri Tri  Ulnar Waveforms Tri Tri  Palmar Arch (Allen's Test) Normal with radial compression, obliterates with ulnar compression Normal with radial compression, decreases >50% with ulnar compression      Lower  Extremity Right Left  Dorsalis Pedis    Anterior Tibial 136, Bi 144, Bi  Posterior Tibial 143, Tri 176, Tri  Ankle/Brachial Indices 1.19 1.47    Reginald Gutierrez, RDMS, RVT 07/05/2014

## 2014-07-05 NOTE — Progress Notes (Signed)
Hypoglycemic Event  CBG: 21  Treatment: Oral Glutose Gel, 4 oz Orange Juice, and Breakfast tray  Symptoms: Asymptomatic  Follow-up CBG: Time:0807 CBG Result:30  Follow-up CBG: Time:0822 CBG Result:60  Follow-up CBG: [id number] CBG Result:138    Possible Reasons for Event: Insulin treatment  Comments/MD notified: Dr. Waymon Amato notified    Reginald Gutierrez  Remember to initiate Hypoglycemia Order Set & complete

## 2014-07-05 NOTE — Progress Notes (Addendum)
PROGRESS NOTE    Reginald Gutierrez BWI:[id number] DOB: 10/25/1965 DOA: 07/03/2014 PCP: No PCP Per Patient  HPI/Brief narrative 48 year old male with history of 3 V CAD, MI 2013, IDDM, chronic systolic CHF, tobacco abuse, substance abuse, HTN, evaluated extensively in the recent past by heart cath in 2015 after he developed progressive symptoms of chest pain and dyspnea and had high risk myocardial perfusion study, EF 35%, CABG recommended but patient undecided, now presented with history of dyspnea, orthopnea & hypoxia. He responded well to Lasix. Lab work showed troponin 4.98 and HONK picture. Chest x-ray was consistent with CHF. Cardiology was consulted.   Assessment/Plan:  1. NSTEMI: cardiology was consulted and patient underwent left heart cath which showed severe three-vessel disease with total occlusion of distal RCA, high-grade and diffuse obstruction in the proximal to mid LAD, high-grade obstruction and a large diagonal, high-grade obstruction in the proximal circumflex/obtuse marginal-no significant changes compared to July 2015 cath. As per cardiology, surgery/CABG would appear to be the best option. TCTS follow-up appreciated and patient is on schedule for surgery on 07/06/14. 2. Uncontrolled type II DM with hypoglycemia: Initially presented with hyperosmolar state and was treated with insulin drip and after stabilized was transitioned to Lantus and SSI. Due to ongoing hypoglycemic episodes since this morning, changed SSI to sensitive without HS scale, discontinued mealtime NovoLog and reduced Lantus to 10 units daily at bedtime. Monitor closely. 3. Hyperkalemia: resolved. 4. Hypothyroidism: Synthroid. 5. Acute systolic CHF/ischemic cardiomyopathy: Improved Lasix in ED.Consider Lasix when necessary. 6. Anemia: Stable. Follow CBCs 7. Anxiety/chronic benzodiazepine use: patient states that he takes Ativan 2 MG 3 times a day scheduled-resume same and monitor.  Code Status: Full Family  Communication: None at bedside Disposition Plan: to be determined.   Consultants:  Cardiology  TCTS  Procedures:  LHC  Antibiotics:  None   Subjective: Denied chest pain this morning. Blood sugars low this morning and patient stated that he's not getting enough food in the hospital and that he takes Lantus 15 units in the morning and 5 units at bedtime along with sliding scale.  Objective: Filed Vitals:   07/04/14 2005 07/05/14 0202 07/05/14 0420 07/05/14 0747  BP: 124/92 112/50 113/51 126/53  Pulse: 81 72 80 74  Temp: 98.1 F (36.7 C) 98.3 F (36.8 C) 98.2 F (36.8 C) 97.8 F (36.6 C)  TempSrc: Oral Oral Oral Oral  Resp: 24   19  Height:      Weight:   78.518 kg (173 lb 1.6 oz)   SpO2: 93% 97% 95% 97%  oxygen saturation 96%.  Intake/Output Summary (Last 24 hours) at 07/05/14 1800 Last data filed at 07/05/14 0800  Gross per 24 hour  Intake    317 ml  Output      0 ml  Net    317 ml   Filed Weights   07/03/14 1547 07/04/14 0450 07/05/14 0420  Weight: 78 kg (171 lb 15.3 oz) 78.336 kg (172 lb 11.2 oz) 78.518 kg (173 lb 1.6 oz)     Exam:  General exam: pleasant young male was seen sitting up in bed eating breakfast this morning. Respiratory system: Clear. No increased work of breathing. Cardiovascular system: S1 & S2 heard, RRR. No JVD, murmurs, gallops, clicks or pedal edema. Telemetry:  Sinus rhythm with frequent PVCs. Gastrointestinal system: Abdomen is nondistended, soft and nontender. Normal bowel sounds heard. Central nervous system: Alert and oriented. No focal neurological deficits. Extremities: Symmetric 5 x 5 power.   Data Reviewed:  Basic Metabolic Panel:  Recent Labs Lab 07/03/14 1453 07/03/14 2136 07/04/14 0040 07/05/14 1342  NA 131* 139 140 136*  K 7.1* 4.4 4.0 4.7  CL 93* 97 100 100  CO2 23 27 27 24   GLUCOSE 513* 295* 206* 301*  BUN 22 22 22 21   CREATININE 1.04 1.17 1.24 1.08  CALCIUM 9.4 9.9 9.4 8.5  MG  --  1.9  --   --     Liver Function Tests:  Recent Labs Lab 07/03/14 1453 07/04/14 0040 07/05/14 1342  AST 55* 43* 25  ALT 39 32 26  ALKPHOS 101 87 87  BILITOT 0.8 0.6 0.4  PROT 7.7 6.5 6.6  ALBUMIN 4.1 3.5 3.3*    Recent Labs Lab 07/03/14 1453  LIPASE 10*   No results for input(s): AMMONIA in the last 168 hours. CBC:  Recent Labs Lab 07/03/14 1453 07/03/14 2136 07/04/14 0040 07/05/14 1342  WBC 14.0* 11.0* 9.6 8.1  NEUTROABS 10.9* 7.1  --   --   HGB 13.8 13.0 11.8* 11.3*  HCT 41.6 38.8* 35.7* 34.1*  MCV 92.0 91.1 90.8 91.7  PLT 357 315 318 268   Cardiac Enzymes:  Recent Labs Lab 07/03/14 1453 07/03/14 2136 07/04/14 0040 07/04/14 1100  TROPONINI 4.98* 5.75* 5.06* 2.74*   BNP (last 3 results)  Recent Labs  07/03/14 1453 07/05/14 1329  PROBNP 10357.0* 821.2*   CBG:  Recent Labs Lab 07/05/14 0822 07/05/14 0838 07/05/14 1133 07/05/14 1502 07/05/14 1718  GLUCAP 60* 138* 355* 158* 57*    Recent Results (from the past 240 hour(s))  MRSA PCR Screening     Status: None   Collection Time: 07/03/14  9:40 PM  Result Value Ref Range Status   MRSA by PCR NEGATIVE NEGATIVE Final    Comment:        The GeneXpert MRSA Assay (FDA approved for NASAL specimens only), is one component of a comprehensive MRSA colonization surveillance program. It is not intended to diagnose MRSA infection nor to guide or monitor treatment for MRSA infections.         Studies: No results found.      Scheduled Meds: . [START ON 07/06/2014] aminocaproic acid (AMICAR) for OHS   Intravenous To OR  . ARIPiprazole  5 mg Oral BID  . atorvastatin  40 mg Oral q1800  . [START ON 07/06/2014] cefUROXime (ZINACEF)  IV  750 mg Intravenous To OR  . [START ON 07/06/2014] dexmedetomidine  0.1-0.7 mcg/kg/hr Intravenous To OR  . [START ON 07/06/2014] DOPamine  0-10 mcg/kg/min Intravenous To OR  . DULoxetine  40 mg Oral Daily  . [START ON 07/06/2014] epinephrine  0.5-20 mcg/min Intravenous To OR   . [START ON 07/06/2014] heparin-papaverine-plasmalyte irrigation   Irrigation To OR  . [START ON 07/06/2014] heparin 30,000 units/NS 1000 mL solution for CELLSAVER   Other To OR  . [START ON 07/06/2014] insulin aspart  0-9 Units Subcutaneous TID WC  . insulin glargine  10 Units Subcutaneous QHS  . [START ON 07/06/2014] insulin (NOVOLIN-R) infusion   Intravenous To OR  . levothyroxine  125 mcg Oral QAC breakfast  . lisinopril  5 mg Oral Daily  . Living Better with Heart Failure Book   Does not apply Once  . [START ON 07/06/2014] magnesium sulfate  40 mEq Other To OR  . [START ON 07/06/2014] nitroGLYCERIN  2-200 mcg/min Intravenous To OR  . [START ON 07/06/2014] phenylephrine (NEO-SYNEPHRINE) Adult infusion  30-200 mcg/min Intravenous To OR  . [START ON  07/06/2014] potassium chloride  80 mEq Other To OR  . sodium chloride  3 mL Intravenous Q12H  . [START ON 07/06/2014] vancomycin 1000 mg in NS (1000 ml) irrigation for Dr. Cornelius Moraswen case   Irrigation To OR   Continuous Infusions: . heparin 1,100 Units/hr (07/05/14 0005)    Principal Problem:   NSTEMI (non-ST elevated myocardial infarction) Active Problems:   Type 1 diabetes mellitus   Acute on chronic systolic congestive heart failure, NYHA class 4   Diabetic hyperosmolar non-ketotic state   CAD, multiple vessel   Hyperkalemia    Time spent: 40 mins    HONGALGI,ANAND, MD, FACP, FHM. [facility] Pager [phone]  If 7PM-7AM, please contact night-coverage [email or web address] Password [email or web address] 07/05/2014, 6:00 PM    LOS: 2 days

## 2014-07-05 NOTE — Progress Notes (Signed)
Patient Profile: 48 y/o male with T1DM admitted for NSTEMI. LHC revealed severe multivessel CAD and low EF of 30-35%. CABG tentatively scheduled for 11/6.   Subjective: No complaints. Denies CP and dyspnea. Still not 100% sure on CABG tomorrow.   Objective: Vital signs in last 24 hours: Temp:  [97.8 F (36.6 C)-98.3 F (36.8 C)] 97.8 F (36.6 C) (11/05 0747) Pulse Rate:  [42-92] 74 (11/05 0747) Resp:  [15-24] 19 (11/05 0747) BP: (97-140)/(18-95) 126/53 mmHg (11/05 0747) SpO2:  [90 %-99 %] 97 % (11/05 0747) Weight:  [173 lb 1.6 oz (78.518 kg)] 173 lb 1.6 oz (78.518 kg) (11/05 0420) Last BM Date: 07/04/14  Intake/Output from previous day: 11/04 0701 - 11/05 0700 In: 77 [I.V.:77] Out: -  Intake/Output this shift:    Medications Current Facility-Administered Medications  Medication Dose Route Frequency Provider Last Rate Last Dose  . acetaminophen (TYLENOL) tablet 650 mg  650 mg Oral Q6H PRN Richarda Overlie, MD   650 mg at 07/05/14 0022   Or  . acetaminophen (TYLENOL) suppository 650 mg  650 mg Rectal Q6H PRN Richarda Overlie, MD      . ARIPiprazole (ABILIFY) tablet 5 mg  5 mg Oral BID Richarda Overlie, MD   5 mg at 07/05/14 0727  . atorvastatin (LIPITOR) tablet 40 mg  40 mg Oral q1800 Richarda Overlie, MD   40 mg at 07/04/14 1827  . chlorpheniramine-HYDROcodone (TUSSIONEX) 10-8 MG/5ML suspension 5 mL  5 mL Oral Q12H PRN Leanne Chang, NP   5 mL at 07/04/14 2223  . dextrose (GLUTOSE) 40 % oral gel 37.5 g  1 Tube Oral PRN Leda Gauze, NP   37.5 g at 07/05/14 0756  . dextrose 50 % solution 25 mL  25 mL Intravenous PRN Richarda Overlie, MD      . DULoxetine (CYMBALTA) DR capsule 40 mg  40 mg Oral Daily Richarda Overlie, MD   40 mg at 07/04/14 1005  . heparin ADULT infusion 100 units/mL (25000 units/250 mL)  1,100 Units/hr Intravenous Continuous Purcell Nails, MD 11 mL/hr at 07/05/14 0005 1,100 Units/hr at 07/05/14 0005  . insulin aspart (novoLOG) injection 0-15 Units  0-15 Units  Subcutaneous TID WC Leda Gauze, NP   11 Units at 07/04/14 1703  . insulin aspart (novoLOG) injection 0-5 Units  0-5 Units Subcutaneous QHS Leda Gauze, NP   4 Units at 07/04/14 2117  . insulin aspart (novoLOG) injection 4 Units  4 Units Subcutaneous TID WC Elease Etienne, MD      . insulin glargine (LANTUS) injection 22 Units  22 Units Subcutaneous QHS Elease Etienne, MD   22 Units at 07/04/14 2117  . levalbuterol (XOPENEX) nebulizer solution 0.63 mg  0.63 mg Nebulization Q6H PRN Richarda Overlie, MD      . levothyroxine (SYNTHROID, LEVOTHROID) tablet 125 mcg  125 mcg Oral QAC breakfast Richarda Overlie, MD   125 mcg at 07/05/14 0727  . lisinopril (PRINIVIL,ZESTRIL) tablet 5 mg  5 mg Oral Daily Richarda Overlie, MD   5 mg at 07/04/14 1005  . Living Better with Heart Failure Book   Does not apply Once Richarda Overlie, MD      . morphine 2 MG/ML injection 1 mg  1 mg Intravenous Q4H PRN Richarda Overlie, MD      . ondansetron (ZOFRAN) tablet 4 mg  4 mg Oral Q6H PRN Richarda Overlie, MD       Or  . ondansetron (ZOFRAN) injection 4 mg  4  mg Intravenous Q6H PRN Richarda OverlieNayana Abrol, MD      . sodium chloride 0.9 % injection 3 mL  3 mL Intravenous Q12H Richarda OverlieNayana Abrol, MD   3 mL at 07/04/14 2120    PE: General appearance: alert, cooperative and no distress Neck: no JVD Lungs: clear to auscultation bilaterally Heart: regular rate and rhythm Extremities: no LEE Pulses: 2+ and symmetric Skin: warm and dry Neurologic: Grossly normal  Lab Results:   Recent Labs  07/03/14 1453 07/03/14 2136 07/04/14 0040  WBC 14.0* 11.0* 9.6  HGB 13.8 13.0 11.8*  HCT 41.6 38.8* 35.7*  PLT 357 315 318   BMET  Recent Labs  07/03/14 1453 07/03/14 2136 07/04/14 0040  NA 131* 139 140  K 7.1* 4.4 4.0  CL 93* 97 100  CO2 23 27 27   GLUCOSE 513* 295* 206*  BUN 22 22 22   CREATININE 1.04 1.17 1.24  CALCIUM 9.4 9.9 9.4   PT/INR  Recent Labs  07/04/14 0520  LABPROT 14.7  INR 1.14   Cholesterol  Recent  Labs  07/04/14 0040  CHOL 143    Studies/Results:  LHC 07/04/14  LEFT VENTRICULOGRAM: Left ventricular angiogram was done in the 30 RAO projection and revealed diffuse hypokinesis impairing inferior wall motion more than other segments. EF 30 to 35% .   IMPRESSIONS: 1. Severe diffuse three-vessel coronary disease with total occlusion of distal RCA, high-grade and diffuse obstruction in the proximal to mid LAD, high-grade obstruction and a large diagonal, high-grade obstruction in the proximal circumflex/obtuse marginal.  2. When compared to the prior angiogram done in July 2015, no significant changes occurred  3. Systolic heart failure, ischemic in nature with EF 30-35%   Assessment/Plan  Principal Problem:   NSTEMI (non-ST elevated myocardial infarction) Active Problems:   Type 1 diabetes mellitus   Acute on chronic systolic congestive heart failure, NYHA class 4   Diabetic hyperosmolar non-ketotic state   CAD, multiple vessel   Hyperkalemia  1. NSTEMI: severe multivessel CAD on cath. Continue medical management until surgical revascularization. Currently CP free.   2. Multivessel CAD: CABG tentatively planned for 11/6.   3. Ischemic cardiomyopathy: EF 30-35% on cath. 2D echo pending. No dyspnea. On ACE. Currently not on BB. Consider adding post CABG if BP stable.   4. Type I DM: continue management per TRH.    LOS: 2 days    Minna Dumire M. Sharol HarnessSimmons, PA-C 07/05/2014 8:40 AM

## 2014-07-05 NOTE — Progress Notes (Signed)
Inpatient Diabetes Program Recommendations  AACE/ADA: New Consensus Statement on Inpatient Glycemic Control (2013)  Target Ranges:  Prepandial:   less than 140 mg/dL      Peak postprandial:   less than 180 mg/dL (1-2 hours)      Critically ill patients:  140 - 180 mg/dL   Results for RAINE, ALWARDT (MRN [id number]) as of 07/05/2014 10:06  Ref. Range 07/05/2014 07:41 07/05/2014 07:44 07/05/2014 08:07 07/05/2014 08:22 07/05/2014 08:38  Glucose-Capillary Latest Range: 70-99 mg/dL 21 (LL) 21 (LL) 30 (LL) 60 (L) 138 (H)   Reason for Visit: Low blood sugar  Diabetes history: Type 2 Outpatient Diabetes medications: Lantus 15 units daily, Novolog 2 units 4x/day with meals Current orders for Inpatient glycemic control: Lantus 18 units daily, Novolog moderate correction ac meals and qhs, Novolog 4 units with meals.   May want to consider decreasing Novolog correction to sensitive based on recent low CBG.   Susette Racer, RN, BA, MHA, CDE Diabetes Coordinator Inpatient Diabetes Program  [phone] (Team Pager) [phone] [facility]) 07/05/2014 10:11 AM

## 2014-07-05 NOTE — Progress Notes (Signed)
[address]       [name] [address]             [phone]     CARDIOTHORACIC SURGERY PROGRESS NOTE  1 Day Post-Op  S/P Procedure(s) (LRB): LEFT HEART CATHETERIZATION WITH CORONARY ANGIOGRAM (N/A)  Subjective: Patient with no symptomatic events overnight, however, CBG revealed a blood glucose of 21 at 7:41 this AM with similar results after re-checking. Patient eating breakfast when I was in room in attempt to raise glucose. Hospitalist doctor aware of situation.   Objective: Vital signs in last 24 hours: Temp:  [97.8 F (36.6 C)-98.3 F (36.8 C)] 97.8 F (36.6 C) (11/05 0747) Pulse Rate:  [42-92] 74 (11/05 0747) Cardiac Rhythm:  [-] Normal sinus rhythm (11/04 2000) Resp:  [15-24] 19 (11/05 0747) BP: (97-140)/(18-95) 126/53 mmHg (11/05 0747) SpO2:  [90 %-99 %] 97 % (11/05 0747) Weight:  [173 lb 1.6 oz (78.518 kg)] 173 lb 1.6 oz (78.518 kg) (11/05 0420)  Physical Exam:  Rhythm:   NSR  Breath sounds: Clear to auscultation bilaterally  Heart sounds:  Normal S1 and S2, no murmurs, rubs, or gallops  Incisions:  N/A  Abdomen:  Soft, non tender, non distended  Extremities:  Warm, well perfused, no edema   Intake/Output from previous day: 11/04 0701 - 11/05 0700 In: 77 [I.V.:77] Out: -  Intake/Output this shift:    Lab Results:  Recent Labs  07/03/14 2136 07/04/14 0040  WBC 11.0* 9.6  HGB 13.0 11.8*  HCT 38.8* 35.7*  PLT 315 318   BMET:  Recent Labs  07/03/14 2136 07/04/14 0040  NA 139 140  K 4.4 4.0  CL 97 100  CO2 27 27  GLUCOSE 295* 206*  BUN 22 22  CREATININE 1.17 1.24  CALCIUM 9.9 9.4    CBG (last 3)   Recent Labs  07/05/14 0741 07/05/14 0744 07/05/14 0807  GLUCAP 21* 21* 30*   PT/INR:   Recent Labs  07/04/14 0520  LABPROT 14.7  INR 1.14    CXR:  EXAM: CHEST 2 VIEW  COMPARISON: Portable chest x-ray of March 31, 2014  FINDINGS: The lungs are adequately inflated. There is no focal infiltrate.  The interstitial markings are increased bilaterally. The pulmonary vascularity is mildly engorged. The cardiac silhouette is enlarged. There is no pleural effusion or pneumothorax. here is stable anterior wedge compression of the body of T11.  IMPRESSION: Congestive heart failure with mild pulmonary interstitial edema. There is no pleural effusion.   Electronically Signed  By: David [place]and  On: 07/03/2014 15:47  Assessment/Plan: S/P Procedure(s) (LRB): LEFT HEART CATHETERIZATION WITH CORONARY ANGIOGRAM (N/A)  Patient with more positive attitude to move forward with surgery this morning; however, he does continue to have some doubts. Patient with Cath yesterday, and ECHO scheduled for today. CABG tentatively scheduled for tomorrow morning.  If blood glucose levels not controlled, regardless of patient's wishes, we will not proceed with surgery. Hopsitalists aware of extremely low blood glucose this morning and will adjust insulin accordingly. Please note patient is a Type I diabetic.   Elvis Coil, PA-S2 07/05/2014 8:19 AM   I have seen and examined the patient and agree with the assessment and plan as outlined.  Glycemic control remains challenging but it is somewhat improved this afternoon.  Will plan to proceed with CABG in am tomorrow.  I have reviewed the indications, risks, and potential benefits of coronary artery bypass grafting with the patient and his mother.  Alternative treatment strategies have been discussed.  The patient understands and accepts all potential associated risks of surgery including but not limited to risk of death, stroke or other neurologic complication, myocardial infarction, congestive heart failure, respiratory failure, renal failure, bleeding requiring blood transfusion and/or reexploration, aortic dissection or other major vascular complication, arrhythmia, heart block or bradycardia requiring permanent pacemaker, pneumonia, pleural effusion,  wound infection, pulmonary embolus or other thromboembolic complication, chronic pain or other delayed complications related to median sternotomy, or the late recurrence of symptomatic ischemic heart disease and/or congestive heart failure.  The importance of long term risk modification have been emphasized.  All questions answered.  I spent in excess of 30 minutes during the conduct of this hospital encounter and >50% of this time involved direct face-to-face encounter with the patient for counseling and/or coordination of their care.    Purcell NailsOWEN,CLARENCE H 07/05/2014 6:35 PM

## 2014-07-05 NOTE — Progress Notes (Signed)
CARDIAC REHAB PHASE I   PRE:  Rate/Rhythm: 75 bigeminy    BP: sitting 127/52    SaO2:   MODE:  Ambulation: 500 ft   POST:  Rate/Rhythm: 89 bigeminy    BP: sitting 115/47     SaO2:   Pt steady walking. Less ectopy walking, occ PVC. Consistently in bigeminy at rest. Denied sx. Pt very anxious with many abrupt questions and outbursts at times. Mother present and appropriate, supportive. Discussed sternal precautions, mobility, IS. Encouraged pt to trust medical teams. Pt sts he would like a cigarette. Discussed risks of smoking. PT tried fake cigarette but did not like it. Declined inquiring about nicotine patch. Will f/u after surgery. [phone]  Elissa Lovett Blair CES, ACSM 07/05/2014 2:24 PM

## 2014-07-06 ENCOUNTER — Inpatient Hospital Stay (HOSPITAL_COMMUNITY): Payer: Medicare Other | Admitting: Anesthesiology

## 2014-07-06 ENCOUNTER — Encounter (HOSPITAL_COMMUNITY): Payer: Self-pay | Admitting: Anesthesiology

## 2014-07-06 ENCOUNTER — Encounter (HOSPITAL_COMMUNITY)
Admission: EM | Disposition: A | Discharge status: Skilled Nursing Facility | Payer: Medicare Other | Source: Home / Self Care | Attending: Thoracic Surgery (Cardiothoracic Vascular Surgery)

## 2014-07-06 ENCOUNTER — Inpatient Hospital Stay (HOSPITAL_COMMUNITY): Payer: Medicare Other

## 2014-07-06 DIAGNOSIS — Z951 Presence of aortocoronary bypass graft: Secondary | ICD-10-CM

## 2014-07-06 DIAGNOSIS — Q211 Atrial septal defect: Secondary | ICD-10-CM

## 2014-07-06 HISTORY — PX: CORONARY ARTERY BYPASS GRAFT: SHX141

## 2014-07-06 HISTORY — DX: Presence of aortocoronary bypass graft: Z95.1

## 2014-07-06 HISTORY — PX: INTRAOPERATIVE TRANSESOPHAGEAL ECHOCARDIOGRAM: SHX5062

## 2014-07-06 HISTORY — PX: PATENT FORAMEN OVALE CLOSURE: SHX5483

## 2014-07-06 LAB — POCT I-STAT 3, ART BLOOD GAS (G3+)
Acid-base deficit: 2 mmol/L (ref 0.0–2.0)
Acid-base deficit: 2 mmol/L (ref 0.0–2.0)
Acid-base deficit: 3 mmol/L — ABNORMAL HIGH (ref 0.0–2.0)
BICARBONATE: 26.8 meq/L — AB (ref 20.0–24.0)
Bicarbonate: 26.3 mEq/L — ABNORMAL HIGH (ref 20.0–24.0)
Bicarbonate: 24.2 mEq/L — ABNORMAL HIGH (ref 20.0–24.0)
Bicarbonate: 22.6 mEq/L (ref 20.0–24.0)
O2 SAT: 100 %
O2 Saturation: 90 %
O2 Saturation: 94 %
O2 Saturation: 96 %
PH ART: 7.318 — AB (ref 7.350–7.450)
Patient temperature: 37.1
Patient temperature: 36.8
TCO2: 28 mmol/L (ref 0–100)
TCO2: 28 mmol/L (ref 0–100)
TCO2: 26 mmol/L (ref 0–100)
TCO2: 24 mmol/L (ref 0–100)
pCO2 arterial: 52.1 mmHg — ABNORMAL HIGH (ref 35.0–45.0)
pCO2 arterial: 65.6 mmHg (ref 35.0–45.0)
pCO2 arterial: 47.2 mmHg — ABNORMAL HIGH (ref 35.0–45.0)
pCO2 arterial: 41.7 mmHg (ref 35.0–45.0)
pH, Arterial: 7.211 — ABNORMAL LOW (ref 7.350–7.450)
pH, Arterial: 7.316 — ABNORMAL LOW (ref 7.350–7.450)
pH, Arterial: 7.347 — ABNORMAL LOW (ref 7.350–7.450)
pO2, Arterial: 448 mmHg — ABNORMAL HIGH (ref 80.0–100.0)
pO2, Arterial: 74 mmHg — ABNORMAL LOW (ref 80.0–100.0)
pO2, Arterial: 76 mmHg — ABNORMAL LOW (ref 80.0–100.0)
pO2, Arterial: 92 mmHg (ref 80.0–100.0)

## 2014-07-06 LAB — CBC
HCT: 30.6 % — ABNORMAL LOW (ref 39.0–52.0)
HEMATOCRIT: 34.4 % — AB (ref 39.0–52.0)
HEMATOCRIT: 32.3 % — AB (ref 39.0–52.0)
HEMOGLOBIN: 11.6 g/dL — AB (ref 13.0–17.0)
HEMOGLOBIN: 10.7 g/dL — AB (ref 13.0–17.0)
Hemoglobin: 10.3 g/dL — ABNORMAL LOW (ref 13.0–17.0)
MCH: 30.9 pg (ref 26.0–34.0)
MCH: 30.5 pg (ref 26.0–34.0)
MCH: 31.1 pg (ref 26.0–34.0)
MCHC: 33.7 g/dL (ref 30.0–36.0)
MCHC: 33.1 g/dL (ref 30.0–36.0)
MCHC: 33.7 g/dL (ref 30.0–36.0)
MCV: 91.7 fL (ref 78.0–100.0)
MCV: 92 fL (ref 78.0–100.0)
MCV: 92.4 fL (ref 78.0–100.0)
Platelets: 286 10*3/uL (ref 150–400)
Platelets: 193 10*3/uL (ref 150–400)
Platelets: 173 10*3/uL (ref 150–400)
RBC: 3.75 MIL/uL — ABNORMAL LOW (ref 4.22–5.81)
RBC: 3.51 MIL/uL — AB (ref 4.22–5.81)
RBC: 3.31 MIL/uL — ABNORMAL LOW (ref 4.22–5.81)
RDW: 14.3 % (ref 11.5–15.5)
RDW: 14.1 % (ref 11.5–15.5)
RDW: 14.1 % (ref 11.5–15.5)
WBC: 6.8 10*3/uL (ref 4.0–10.5)
WBC: 14.1 10*3/uL — AB (ref 4.0–10.5)
WBC: 12.5 10*3/uL — ABNORMAL HIGH (ref 4.0–10.5)

## 2014-07-06 LAB — POCT I-STAT GLUCOSE
GLUCOSE: 177 mg/dL — AB (ref 70–99)
Glucose, Bld: 119 mg/dL — ABNORMAL HIGH (ref 70–99)
Operator id: 238531
Operator id: 3402

## 2014-07-06 LAB — CREATININE, SERUM
Creatinine, Ser: 0.97 mg/dL (ref 0.50–1.35)
GFR calc Af Amer: >90 mL/min (ref >90)
GFR calc non Af Amer: >90 mL/min (ref >90)

## 2014-07-06 LAB — GLUCOSE, CAPILLARY
GLUCOSE-CAPILLARY: 110 mg/dL — AB (ref 70–99)
GLUCOSE-CAPILLARY: 115 mg/dL — AB (ref 70–99)
GLUCOSE-CAPILLARY: 147 mg/dL — AB (ref 70–99)
GLUCOSE-CAPILLARY: 151 mg/dL — AB (ref 70–99)
Glucose-Capillary: 71 mg/dL (ref 70–99)
Glucose-Capillary: 79 mg/dL (ref 70–99)
Glucose-Capillary: 123 mg/dL — ABNORMAL HIGH (ref 70–99)
Glucose-Capillary: 129 mg/dL — ABNORMAL HIGH (ref 70–99)
Glucose-Capillary: 124 mg/dL — ABNORMAL HIGH (ref 70–99)

## 2014-07-06 LAB — BASIC METABOLIC PANEL
Anion gap: 12 (ref 5–15)
BUN: 15 mg/dL (ref 6–23)
CO2: 25 mEq/L (ref 19–32)
CREATININE: 1.01 mg/dL (ref 0.50–1.35)
Calcium: 8.8 mg/dL (ref 8.4–10.5)
Chloride: 102 mEq/L (ref 96–112)
GFR, EST NON AFRICAN AMERICAN: 86 mL/min — AB (ref >90)
GLUCOSE: 286 mg/dL — AB (ref 70–99)
POTASSIUM: 5.5 meq/L — AB (ref 3.7–5.3)
Sodium: 139 mEq/L (ref 137–147)

## 2014-07-06 LAB — POCT I-STAT, CHEM 8
BUN: 14 mg/dL (ref 6–23)
BUN: 12 mg/dL (ref 6–23)
BUN: 13 mg/dL (ref 6–23)
BUN: 13 mg/dL (ref 6–23)
BUN: 14 mg/dL (ref 6–23)
BUN: 14 mg/dL (ref 6–23)
CALCIUM ION: 1.21 mmol/L (ref 1.12–1.23)
CALCIUM ION: 1.24 mmol/L — AB (ref 1.12–1.23)
CALCIUM ION: 1.12 mmol/L (ref 1.12–1.23)
CALCIUM ION: 1.25 mmol/L — AB (ref 1.12–1.23)
CREATININE: 0.8 mg/dL (ref 0.50–1.35)
CREATININE: 0.7 mg/dL (ref 0.50–1.35)
CREATININE: 0.8 mg/dL (ref 0.50–1.35)
Calcium, Ion: 1.04 mmol/L — ABNORMAL LOW (ref 1.12–1.23)
Calcium, Ion: 1.34 mmol/L — ABNORMAL HIGH (ref 1.12–1.23)
Chloride: 102 mEq/L (ref 96–112)
Chloride: 105 mEq/L (ref 96–112)
Chloride: 97 mEq/L (ref 96–112)
Chloride: 101 mEq/L (ref 96–112)
Chloride: 102 mEq/L (ref 96–112)
Chloride: 108 mEq/L (ref 96–112)
Creatinine, Ser: 0.7 mg/dL (ref 0.50–1.35)
Creatinine, Ser: 0.8 mg/dL (ref 0.50–1.35)
Creatinine, Ser: 1 mg/dL (ref 0.50–1.35)
GLUCOSE: 143 mg/dL — AB (ref 70–99)
Glucose, Bld: 264 mg/dL — ABNORMAL HIGH (ref 70–99)
Glucose, Bld: 78 mg/dL (ref 70–99)
Glucose, Bld: 137 mg/dL — ABNORMAL HIGH (ref 70–99)
Glucose, Bld: 224 mg/dL — ABNORMAL HIGH (ref 70–99)
Glucose, Bld: 143 mg/dL — ABNORMAL HIGH (ref 70–99)
HCT: 30 % — ABNORMAL LOW (ref 39.0–52.0)
HCT: 25 % — ABNORMAL LOW (ref 39.0–52.0)
HCT: 32 % — ABNORMAL LOW (ref 39.0–52.0)
HCT: 21 % — ABNORMAL LOW (ref 39.0–52.0)
HCT: 29 % — ABNORMAL LOW (ref 39.0–52.0)
HEMATOCRIT: 21 % — AB (ref 39.0–52.0)
HEMOGLOBIN: 8.5 g/dL — AB (ref 13.0–17.0)
HEMOGLOBIN: 7.1 g/dL — AB (ref 13.0–17.0)
Hemoglobin: 10.2 g/dL — ABNORMAL LOW (ref 13.0–17.0)
Hemoglobin: 10.9 g/dL — ABNORMAL LOW (ref 13.0–17.0)
Hemoglobin: 7.1 g/dL — ABNORMAL LOW (ref 13.0–17.0)
Hemoglobin: 9.9 g/dL — ABNORMAL LOW (ref 13.0–17.0)
Potassium: 5.1 mEq/L (ref 3.7–5.3)
Potassium: 4.1 mEq/L (ref 3.7–5.3)
Potassium: 4.6 mEq/L (ref 3.7–5.3)
Potassium: 4.8 mEq/L (ref 3.7–5.3)
Potassium: 4.1 mEq/L (ref 3.7–5.3)
Potassium: 4.9 mEq/L (ref 3.7–5.3)
SODIUM: 135 meq/L — AB (ref 137–147)
SODIUM: 137 meq/L (ref 137–147)
SODIUM: 136 meq/L — AB (ref 137–147)
Sodium: 137 mEq/L (ref 137–147)
Sodium: 135 mEq/L — ABNORMAL LOW (ref 137–147)
Sodium: 135 mEq/L — ABNORMAL LOW (ref 137–147)
TCO2: 27 mmol/L (ref 0–100)
TCO2: 27 mmol/L (ref 0–100)
TCO2: 28 mmol/L (ref 0–100)
TCO2: 23 mmol/L (ref 0–100)
TCO2: 24 mmol/L (ref 0–100)
TCO2: 22 mmol/L (ref 0–100)

## 2014-07-06 LAB — PLATELET COUNT: PLATELETS: 179 10*3/uL (ref 150–400)

## 2014-07-06 LAB — HEMOGLOBIN AND HEMATOCRIT, BLOOD
HCT: 22.5 % — ABNORMAL LOW (ref 39.0–52.0)
HEMOGLOBIN: 7.6 g/dL — AB (ref 13.0–17.0)

## 2014-07-06 LAB — POCT I-STAT 4, (NA,K, GLUC, HGB,HCT)
Glucose, Bld: 118 mg/dL — ABNORMAL HIGH (ref 70–99)
HCT: 33 % — ABNORMAL LOW (ref 39.0–52.0)
Hemoglobin: 11.2 g/dL — ABNORMAL LOW (ref 13.0–17.0)
Potassium: 4.4 mEq/L (ref 3.7–5.3)
Sodium: 139 mEq/L (ref 137–147)

## 2014-07-06 LAB — MAGNESIUM: Magnesium: 3 mg/dL — ABNORMAL HIGH (ref 1.5–2.5)

## 2014-07-06 LAB — APTT
APTT: 32 s (ref 24–37)
APTT: 31 s (ref 24–37)

## 2014-07-06 LAB — PROTIME-INR
INR: 1.38 (ref 0.00–1.49)
PROTHROMBIN TIME: 17.2 s — AB (ref 11.6–15.2)

## 2014-07-06 SURGERY — CORONARY ARTERY BYPASS GRAFTING (CABG)
Anesthesia: General | Site: Chest

## 2014-07-06 MED ORDER — LACTATED RINGERS IV SOLN
INTRAVENOUS | Status: DC
  Administered 2014-07-06: 16:00:00 via INTRAVENOUS
  Administered 2014-07-07: 20 mL/h via INTRAVENOUS

## 2014-07-06 MED ORDER — ALBUMIN HUMAN 5 % IV SOLN
INTRAVENOUS | Status: DC | PRN
  Administered 2014-07-06: 13:00:00 via INTRAVENOUS

## 2014-07-06 MED ORDER — VANCOMYCIN HCL IN DEXTROSE 1-5 GM/200ML-% IV SOLN
1000.0000 mg | Freq: Once | INTRAVENOUS | Status: AC
  Administered 2014-07-06: 1000 mg via INTRAVENOUS
  Filled 2014-07-06: qty 200

## 2014-07-06 MED ORDER — EPHEDRINE SULFATE 50 MG/ML IJ SOLN
INTRAMUSCULAR | Status: AC
  Filled 2014-07-06: qty 1

## 2014-07-06 MED ORDER — ALBUTEROL SULFATE (2.5 MG/3ML) 0.083% IN NEBU
2.5000 mg | INHALATION_SOLUTION | RESPIRATORY_TRACT | Status: DC
  Administered 2014-07-06 – 2014-07-07 (×8): 2.5 mg via RESPIRATORY_TRACT
  Filled 2014-07-06 (×8): qty 3

## 2014-07-06 MED ORDER — DEXTROSE 5 % IV SOLN
1.5000 g | Freq: Two times a day (BID) | INTRAVENOUS | Status: AC
  Administered 2014-07-06 – 2014-07-08 (×4): 1.5 g via INTRAVENOUS
  Filled 2014-07-06 (×5): qty 1.5

## 2014-07-06 MED ORDER — PROPOFOL 10 MG/ML IV BOLUS
INTRAVENOUS | Status: AC
  Filled 2014-07-06: qty 20

## 2014-07-06 MED ORDER — SODIUM CHLORIDE 0.9 % IJ SOLN
3.0000 mL | INTRAMUSCULAR | Status: DC | PRN
  Administered 2014-07-08: 3 mL via INTRAVENOUS
  Filled 2014-07-06: qty 3

## 2014-07-06 MED ORDER — ROCURONIUM BROMIDE 50 MG/5ML IV SOLN
INTRAVENOUS | Status: AC
  Filled 2014-07-06: qty 1

## 2014-07-06 MED ORDER — ATORVASTATIN CALCIUM 40 MG PO TABS
40.0000 mg | ORAL_TABLET | Freq: Every day | ORAL | Status: DC
Start: 2014-07-06 — End: 2014-07-11
  Administered 2014-07-06 – 2014-07-11 (×6): 40 mg via ORAL
  Filled 2014-07-06 (×6): qty 1

## 2014-07-06 MED ORDER — DOCUSATE SODIUM 100 MG PO CAPS
200.0000 mg | ORAL_CAPSULE | Freq: Every day | ORAL | Status: DC
  Administered 2014-07-07 – 2014-07-11 (×5): 200 mg via ORAL
  Filled 2014-07-06 (×5): qty 2

## 2014-07-06 MED ORDER — FENTANYL CITRATE 0.05 MG/ML IJ SOLN
INTRAMUSCULAR | Status: AC
  Filled 2014-07-06: qty 5

## 2014-07-06 MED ORDER — DULOXETINE HCL 20 MG PO CPEP
40.0000 mg | ORAL_CAPSULE | Freq: Every day | ORAL | Status: DC
  Administered 2014-07-06 – 2014-07-11 (×6): 40 mg via ORAL
  Filled 2014-07-06 (×6): qty 2

## 2014-07-06 MED ORDER — SODIUM CHLORIDE 0.9 % IJ SOLN
3.0000 mL | Freq: Two times a day (BID) | INTRAMUSCULAR | Status: DC
  Administered 2014-07-07 – 2014-07-09 (×4): 3 mL via INTRAVENOUS

## 2014-07-06 MED ORDER — FENTANYL CITRATE 0.05 MG/ML IJ SOLN
INTRAMUSCULAR | Status: DC | PRN
  Administered 2014-07-06 (×3): 50 ug via INTRAVENOUS
  Administered 2014-07-06: 100 ug via INTRAVENOUS
  Administered 2014-07-06: 150 ug via INTRAVENOUS
  Administered 2014-07-06: 50 ug via INTRAVENOUS
  Administered 2014-07-06: 100 ug via INTRAVENOUS
  Administered 2014-07-06: 50 ug via INTRAVENOUS
  Administered 2014-07-06 (×3): 100 ug via INTRAVENOUS
  Administered 2014-07-06: 50 ug via INTRAVENOUS
  Administered 2014-07-06: 250 ug via INTRAVENOUS
  Administered 2014-07-06: 50 ug via INTRAVENOUS

## 2014-07-06 MED ORDER — SUCCINYLCHOLINE CHLORIDE 20 MG/ML IJ SOLN
INTRAMUSCULAR | Status: AC
  Filled 2014-07-06: qty 1

## 2014-07-06 MED ORDER — DEXMEDETOMIDINE HCL IN NACL 200 MCG/50ML IV SOLN
0.1000 ug/kg/h | INTRAVENOUS | Status: DC
  Administered 2014-07-06: 0.7 ug/kg/h via INTRAVENOUS
  Filled 2014-07-06: qty 50

## 2014-07-06 MED ORDER — HEPARIN SODIUM (PORCINE) 1000 UNIT/ML IJ SOLN
INTRAMUSCULAR | Status: AC
  Filled 2014-07-06: qty 1

## 2014-07-06 MED ORDER — ARTIFICIAL TEARS OP OINT
TOPICAL_OINTMENT | OPHTHALMIC | Status: AC
  Filled 2014-07-06: qty 3.5

## 2014-07-06 MED ORDER — DEXMEDETOMIDINE HCL IN NACL 400 MCG/100ML IV SOLN
0.4000 ug/kg/h | INTRAVENOUS | Status: DC
  Filled 2014-07-06: qty 100

## 2014-07-06 MED ORDER — PANTOPRAZOLE SODIUM 40 MG PO TBEC
40.0000 mg | DELAYED_RELEASE_TABLET | Freq: Every day | ORAL | Status: DC
  Administered 2014-07-08 – 2014-07-11 (×4): 40 mg via ORAL
  Filled 2014-07-06 (×4): qty 1

## 2014-07-06 MED ORDER — METOPROLOL TARTRATE 25 MG/10 ML ORAL SUSPENSION
12.5000 mg | Freq: Two times a day (BID) | ORAL | Status: DC
  Filled 2014-07-06 (×7): qty 5

## 2014-07-06 MED ORDER — SODIUM CHLORIDE 0.9 % IV SOLN
10.0000 g | INTRAVENOUS | Status: DC | PRN
  Administered 2014-07-06: 5 g/h via INTRAVENOUS

## 2014-07-06 MED ORDER — NITROGLYCERIN IN D5W 200-5 MCG/ML-% IV SOLN: 0.0000 ug/min | INTRAVENOUS | Status: DC

## 2014-07-06 MED ORDER — SODIUM CHLORIDE 0.9 % IV SOLN
0.5000 g/h | Freq: Once | INTRAVENOUS | Status: DC
  Filled 2014-07-06: qty 20

## 2014-07-06 MED ORDER — ASPIRIN 81 MG PO CHEW: 324.0000 mg | CHEWABLE_TABLET | Freq: Every day | ORAL | Status: DC

## 2014-07-06 MED ORDER — SODIUM CHLORIDE 0.45 % IV SOLN
INTRAVENOUS | Status: DC
  Administered 2014-07-08: 20 mL/h via INTRAVENOUS

## 2014-07-06 MED ORDER — BISACODYL 10 MG RE SUPP: 10.0000 mg | Freq: Every day | RECTAL | Status: DC

## 2014-07-06 MED ORDER — DOPAMINE-DEXTROSE 3.2-5 MG/ML-% IV SOLN: 0.0000 ug/kg/min | INTRAVENOUS | Status: DC

## 2014-07-06 MED ORDER — MORPHINE SULFATE 2 MG/ML IJ SOLN
2.0000 mg | INTRAMUSCULAR | Status: DC | PRN
  Administered 2014-07-06: 2 mg via INTRAVENOUS
  Administered 2014-07-07 (×2): 4 mg via INTRAVENOUS
  Administered 2014-07-07: 2 mg via INTRAVENOUS
  Administered 2014-07-07: 4 mg via INTRAVENOUS
  Filled 2014-07-06: qty 2
  Filled 2014-07-06: qty 1
  Filled 2014-07-06: qty 2
  Filled 2014-07-06: qty 1

## 2014-07-06 MED ORDER — SODIUM CHLORIDE 0.9 % IV SOLN: INTRAVENOUS | Status: DC | PRN

## 2014-07-06 MED ORDER — LIDOCAINE HCL (CARDIAC) 20 MG/ML IV SOLN
INTRAVENOUS | Status: AC
  Filled 2014-07-06: qty 5

## 2014-07-06 MED ORDER — SODIUM CHLORIDE 0.9 % IJ SOLN
INTRAMUSCULAR | Status: AC
  Filled 2014-07-06: qty 10

## 2014-07-06 MED ORDER — ROCURONIUM BROMIDE 50 MG/5ML IV SOLN
INTRAVENOUS | Status: AC
  Filled 2014-07-06: qty 2

## 2014-07-06 MED ORDER — STERILE WATER FOR INJECTION IJ SOLN
INTRAMUSCULAR | Status: AC
  Filled 2014-07-06: qty 20

## 2014-07-06 MED ORDER — OXYCODONE HCL 5 MG PO TABS
5.0000 mg | ORAL_TABLET | ORAL | Status: DC | PRN
  Administered 2014-07-10: 10 mg via ORAL
  Administered 2014-07-10: 5 mg via ORAL
  Administered 2014-07-11 (×3): 10 mg via ORAL
  Filled 2014-07-06 (×3): qty 2
  Filled 2014-07-06: qty 1
  Filled 2014-07-06: qty 2

## 2014-07-06 MED ORDER — LACTATED RINGERS IV SOLN: 500.0000 mL | Freq: Once | INTRAVENOUS | Status: AC | PRN

## 2014-07-06 MED ORDER — SODIUM CHLORIDE 0.9 % IJ SOLN
OROMUCOSAL | Status: DC | PRN
  Administered 2014-07-06: 4 mL via TOPICAL

## 2014-07-06 MED ORDER — METOPROLOL TARTRATE 1 MG/ML IV SOLN: 2.5000 mg | INTRAVENOUS | Status: DC | PRN

## 2014-07-06 MED ORDER — MILRINONE IN DEXTROSE 20 MG/100ML IV SOLN
0.1250 ug/kg/min | INTRAVENOUS | Status: DC
  Filled 2014-07-06: qty 100

## 2014-07-06 MED ORDER — CETYLPYRIDINIUM CHLORIDE 0.05 % MT LIQD
7.0000 mL | Freq: Four times a day (QID) | OROMUCOSAL | Status: DC
  Administered 2014-07-06 – 2014-07-08 (×7): 7 mL via OROMUCOSAL

## 2014-07-06 MED ORDER — FAMOTIDINE IN NACL 20-0.9 MG/50ML-% IV SOLN
20.0000 mg | Freq: Two times a day (BID) | INTRAVENOUS | Status: AC
  Administered 2014-07-06 (×2): 20 mg via INTRAVENOUS
  Filled 2014-07-06: qty 50

## 2014-07-06 MED ORDER — BISACODYL 5 MG PO TBEC
10.0000 mg | DELAYED_RELEASE_TABLET | Freq: Every day | ORAL | Status: DC
  Administered 2014-07-07 – 2014-07-11 (×4): 10 mg via ORAL
  Filled 2014-07-06 (×4): qty 2

## 2014-07-06 MED ORDER — 0.9 % SODIUM CHLORIDE (POUR BTL) OPTIME
TOPICAL | Status: DC | PRN
  Administered 2014-07-06: 6000 mL

## 2014-07-06 MED ORDER — PROPOFOL 10 MG/ML IV BOLUS
INTRAVENOUS | Status: DC | PRN
  Administered 2014-07-06: 30 mg via INTRAVENOUS

## 2014-07-06 MED ORDER — FENTANYL 25 MCG/HR TD PT72: 50.0000 ug | MEDICATED_PATCH | TRANSDERMAL | Status: DC

## 2014-07-06 MED ORDER — PHENYLEPHRINE HCL 10 MG/ML IJ SOLN
10.0000 mg | INTRAVENOUS | Status: DC | PRN
  Administered 2014-07-06: 5 ug/min via INTRAVENOUS

## 2014-07-06 MED ORDER — ONDANSETRON HCL 4 MG/2ML IJ SOLN: 4.0000 mg | Freq: Four times a day (QID) | INTRAMUSCULAR | Status: DC | PRN

## 2014-07-06 MED ORDER — ALBUTEROL SULFATE HFA 108 (90 BASE) MCG/ACT IN AERS
INHALATION_SPRAY | RESPIRATORY_TRACT | Status: DC | PRN
  Administered 2014-07-06 (×2): 4 via RESPIRATORY_TRACT

## 2014-07-06 MED ORDER — ROCURONIUM BROMIDE 100 MG/10ML IV SOLN
INTRAVENOUS | Status: DC | PRN
  Administered 2014-07-06 (×4): 50 mg via INTRAVENOUS

## 2014-07-06 MED ORDER — CHLORHEXIDINE GLUCONATE 0.12 % MT SOLN
15.0000 mL | Freq: Two times a day (BID) | OROMUCOSAL | Status: DC
  Administered 2014-07-06 – 2014-07-07 (×3): 15 mL via OROMUCOSAL
  Filled 2014-07-06 (×3): qty 15

## 2014-07-06 MED ORDER — ARIPIPRAZOLE 5 MG PO TABS
5.0000 mg | ORAL_TABLET | Freq: Two times a day (BID) | ORAL | Status: DC
  Administered 2014-07-06 – 2014-07-11 (×10): 5 mg via ORAL
  Filled 2014-07-06 (×12): qty 1

## 2014-07-06 MED ORDER — SODIUM CHLORIDE 0.9 % IV SOLN
INTRAVENOUS | Status: DC
  Administered 2014-07-06: 16:00:00 via INTRAVENOUS

## 2014-07-06 MED ORDER — INSULIN REGULAR BOLUS VIA INFUSION
0.0000 [IU] | Freq: Three times a day (TID) | INTRAVENOUS | Status: DC
  Filled 2014-07-06: qty 10

## 2014-07-06 MED ORDER — PROTAMINE SULFATE 10 MG/ML IV SOLN
INTRAVENOUS | Status: DC | PRN
  Administered 2014-07-06: 250 mg via INTRAVENOUS

## 2014-07-06 MED ORDER — LEVOTHYROXINE SODIUM 125 MCG PO TABS
125.0000 ug | ORAL_TABLET | Freq: Every day | ORAL | Status: DC
  Administered 2014-07-07 – 2014-07-09 (×3): 125 ug via ORAL
  Filled 2014-07-06 (×4): qty 1

## 2014-07-06 MED ORDER — MIDAZOLAM HCL 10 MG/2ML IJ SOLN
INTRAMUSCULAR | Status: AC
  Filled 2014-07-06: qty 2

## 2014-07-06 MED ORDER — MIDAZOLAM HCL 2 MG/2ML IJ SOLN: 2.0000 mg | INTRAMUSCULAR | Status: DC | PRN

## 2014-07-06 MED ORDER — MIDAZOLAM HCL 5 MG/5ML IJ SOLN
INTRAMUSCULAR | Status: DC | PRN
  Administered 2014-07-06: 1 mg via INTRAVENOUS
  Administered 2014-07-06: 3 mg via INTRAVENOUS
  Administered 2014-07-06: 1 mg via INTRAVENOUS
  Administered 2014-07-06: 4 mg via INTRAVENOUS
  Administered 2014-07-06: 1 mg via INTRAVENOUS

## 2014-07-06 MED ORDER — ALBUMIN HUMAN 5 % IV SOLN
250.0000 mL | INTRAVENOUS | Status: AC | PRN
  Administered 2014-07-06: 250 mL via INTRAVENOUS

## 2014-07-06 MED ORDER — ASPIRIN EC 325 MG PO TBEC
325.0000 mg | DELAYED_RELEASE_TABLET | Freq: Every day | ORAL | Status: DC
  Administered 2014-07-07 – 2014-07-08 (×2): 325 mg via ORAL
  Filled 2014-07-06 (×3): qty 1

## 2014-07-06 MED ORDER — ACETAMINOPHEN 650 MG RE SUPP: 650.0000 mg | Freq: Once | RECTAL | Status: AC

## 2014-07-06 MED ORDER — POTASSIUM CHLORIDE 10 MEQ/50ML IV SOLN: 10.0000 meq | INTRAVENOUS | Status: AC

## 2014-07-06 MED ORDER — MORPHINE SULFATE 2 MG/ML IJ SOLN
1.0000 mg | INTRAMUSCULAR | Status: AC | PRN
  Administered 2014-07-06: 2 mg via INTRAVENOUS
  Administered 2014-07-06: 4 mg via INTRAVENOUS
  Filled 2014-07-06: qty 1
  Filled 2014-07-06 (×2): qty 2

## 2014-07-06 MED ORDER — LACTATED RINGERS IV SOLN
INTRAVENOUS | Status: DC | PRN
  Administered 2014-07-06: 07:00:00 via INTRAVENOUS

## 2014-07-06 MED ORDER — MAGNESIUM SULFATE 4 GM/100ML IV SOLN
4.0000 g | Freq: Once | INTRAVENOUS | Status: AC
  Administered 2014-07-06: 4 g via INTRAVENOUS
  Filled 2014-07-06: qty 100

## 2014-07-06 MED ORDER — PHENYLEPHRINE HCL 10 MG/ML IJ SOLN
0.0000 ug/min | INTRAMUSCULAR | Status: DC
  Filled 2014-07-06 (×2): qty 1

## 2014-07-06 MED ORDER — ACETAMINOPHEN 500 MG PO TABS
1000.0000 mg | ORAL_TABLET | Freq: Four times a day (QID) | ORAL | Status: DC
Start: 2014-07-07 — End: 2014-07-11
  Administered 2014-07-06 – 2014-07-11 (×18): 1000 mg via ORAL
  Filled 2014-07-06 (×21): qty 2

## 2014-07-06 MED ORDER — LACTATED RINGERS IV SOLN
INTRAVENOUS | Status: DC | PRN
  Administered 2014-07-06 (×2): via INTRAVENOUS

## 2014-07-06 MED ORDER — PHENYLEPHRINE HCL 10 MG/ML IJ SOLN
0.0000 ug/min | INTRAMUSCULAR | Status: DC
  Administered 2014-07-06: 30 ug/min via INTRAVENOUS
  Filled 2014-07-06 (×2): qty 2

## 2014-07-06 MED ORDER — INSULIN REGULAR HUMAN 100 UNIT/ML IJ SOLN
INTRAMUSCULAR | Status: DC
  Filled 2014-07-06: qty 2.5

## 2014-07-06 MED ORDER — VECURONIUM BROMIDE 10 MG IV SOLR
INTRAVENOUS | Status: DC | PRN
  Administered 2014-07-06 (×2): 5 mg via INTRAVENOUS

## 2014-07-06 MED ORDER — ACETAMINOPHEN 160 MG/5ML PO SOLN
650.0000 mg | Freq: Once | ORAL | Status: AC
  Administered 2014-07-06: 650 mg
  Filled 2014-07-06: qty 20.3

## 2014-07-06 MED ORDER — METOPROLOL TARTRATE 12.5 MG HALF TABLET
12.5000 mg | ORAL_TABLET | Freq: Two times a day (BID) | ORAL | Status: DC
  Administered 2014-07-07 – 2014-07-08 (×4): 12.5 mg via ORAL
  Filled 2014-07-06 (×7): qty 1

## 2014-07-06 MED ORDER — HEPARIN SODIUM (PORCINE) 1000 UNIT/ML IJ SOLN
INTRAMUSCULAR | Status: DC | PRN
  Administered 2014-07-06: 3000 [IU] via INTRAVENOUS
  Administered 2014-07-06: 22000 [IU] via INTRAVENOUS
  Administered 2014-07-06: 5000 [IU] via INTRAVENOUS

## 2014-07-06 MED ORDER — SODIUM CHLORIDE 0.9 % IV SOLN
250.0000 mL | INTRAVENOUS | Status: AC
  Administered 2014-07-06: 250 mL via INTRAVENOUS

## 2014-07-06 MED ORDER — ACETAMINOPHEN 160 MG/5ML PO SOLN: 1000.0000 mg | Freq: Four times a day (QID) | ORAL | Status: DC

## 2014-07-06 SURGICAL SUPPLY — 140 items
ADAPTER CARDIO PERF ANTE/RETRO (ADAPTER) ×1 IMPLANT
ADPR PRFSN 84XANTGRD RTRGD (ADAPTER) ×2
APL SKNCLS STERI-STRIP NONHPOA (GAUZE/BANDAGES/DRESSINGS) ×2
APPLIER CLIP 9.375 MED OPEN (MISCELLANEOUS)
APPLIER CLIP 9.375 SM OPEN (CLIP) ×9
APR CLP MED 9.3 20 MLT OPN (MISCELLANEOUS)
APR CLP SM 9.3 20 MLT OPN (CLIP) ×6
ARTERIAL PRESSURE LINE (MISCELLANEOUS) ×2 IMPLANT
ATTRACTOMAT 16X20 MAGNETIC DRP (DRAPES) ×2 IMPLANT
BAG DECANTER FOR FLEXI CONT (MISCELLANEOUS) ×4 IMPLANT
BANDAGE ELASTIC 4 VELCRO ST LF (GAUZE/BANDAGES/DRESSINGS) ×3 IMPLANT
BANDAGE ELASTIC 6 VELCRO ST LF (GAUZE/BANDAGES/DRESSINGS) ×3 IMPLANT
BASKET HEART (ORDER IN 25'S) (MISCELLANEOUS) ×1
BASKET HEART (ORDER IN 25S) (MISCELLANEOUS) ×2 IMPLANT
BENZOIN TINCTURE PRP APPL 2/3 (GAUZE/BANDAGES/DRESSINGS) ×3 IMPLANT
BLADE STERNUM SYSTEM 6 (BLADE) ×3 IMPLANT
BLADE SURG ROTATE 9660 (MISCELLANEOUS) ×1 IMPLANT
BNDG GAUZE ELAST 4 BULKY (GAUZE/BANDAGES/DRESSINGS) ×3 IMPLANT
CANISTER SUCTION 2500CC (MISCELLANEOUS) ×3 IMPLANT
CANNULA EZ GLIDE AORTIC 21FR (CANNULA) ×5 IMPLANT
CANNULA GUNDRY RCSP 15FR (MISCELLANEOUS) ×2 IMPLANT
CANNULA VENOUS LOW PROF 34X46 (CANNULA) ×2 IMPLANT
CANNULA VRC MALLEABLE 20FR (CANNULA) ×1 IMPLANT
CARDIAC SUCTION (MISCELLANEOUS) ×3 IMPLANT
CATH CPB KIT OWEN (MISCELLANEOUS) ×3 IMPLANT
CATH THORACIC 28FR (CATHETERS) IMPLANT
CATH THORACIC 28FR RT ANG (CATHETERS) IMPLANT
CATH THORACIC 36FR (CATHETERS) ×3 IMPLANT
CATH THORACIC 36FR RT ANG (CATHETERS) ×3 IMPLANT
CLIP APPLIE 9.375 MED OPEN (MISCELLANEOUS) IMPLANT
CLIP APPLIE 9.375 SM OPEN (CLIP) IMPLANT
CLIP FOGARTY SPRING 6M (CLIP) IMPLANT
CLIP TI MEDIUM 24 (CLIP) IMPLANT
CLIP TI WIDE RED SMALL 24 (CLIP) IMPLANT
CONN 1/2X1/2X1/2  BEN (MISCELLANEOUS) ×1
CONN 1/2X1/2X1/2 BEN (MISCELLANEOUS) IMPLANT
CONN 3/8X1/2 ST GISH (MISCELLANEOUS) ×1 IMPLANT
CONN Y 3/8X3/8X3/8  BEN (MISCELLANEOUS)
CONN Y 3/8X3/8X3/8 BEN (MISCELLANEOUS) IMPLANT
COUNTER NEEDLE 20 DBL MAG RED (NEEDLE) ×1 IMPLANT
COVER PROBE W GEL 5X96 (DRAPES) ×1 IMPLANT
COVER SURGICAL LIGHT HANDLE (MISCELLANEOUS) ×2 IMPLANT
CRADLE DONUT ADULT HEAD (MISCELLANEOUS) ×3 IMPLANT
DRAIN CHANNEL 32F RND 10.7 FF (WOUND CARE) ×4 IMPLANT
DRAPE CARDIOVASCULAR INCISE (DRAPES) ×3
DRAPE INCISE IOBAN 66X45 STRL (DRAPES) ×3 IMPLANT
DRAPE SLUSH/WARMER DISC (DRAPES) ×3 IMPLANT
DRAPE SRG 135X102X78XABS (DRAPES) ×2 IMPLANT
DRSG AQUACEL AG ADV 3.5X14 (GAUZE/BANDAGES/DRESSINGS) ×1 IMPLANT
DRSG COVADERM 4X14 (GAUZE/BANDAGES/DRESSINGS) ×3 IMPLANT
ELECT BLADE 4.0 EZ CLEAN MEGAD (MISCELLANEOUS) ×3
ELECT REM PT RETURN 9FT ADLT (ELECTROSURGICAL) ×6
ELECTRODE BLDE 4.0 EZ CLN MEGD (MISCELLANEOUS) IMPLANT
ELECTRODE REM PT RTRN 9FT ADLT (ELECTROSURGICAL) ×4 IMPLANT
GAUZE SPONGE 4X4 12PLY STRL (GAUZE/BANDAGES/DRESSINGS) ×6 IMPLANT
GLOVE BIO SURGEON STRL SZ 6 (GLOVE) ×4 IMPLANT
GLOVE BIO SURGEON STRL SZ 6.5 (GLOVE) ×2 IMPLANT
GLOVE BIO SURGEON STRL SZ7 (GLOVE) IMPLANT
GLOVE BIO SURGEON STRL SZ7.5 (GLOVE) IMPLANT
GLOVE BIOGEL PI IND STRL 6 (GLOVE) IMPLANT
GLOVE BIOGEL PI IND STRL 6.5 (GLOVE) IMPLANT
GLOVE BIOGEL PI IND STRL 7.0 (GLOVE) IMPLANT
GLOVE BIOGEL PI INDICATOR 6 (GLOVE) ×2
GLOVE BIOGEL PI INDICATOR 6.5 (GLOVE) ×4
GLOVE BIOGEL PI INDICATOR 7.0 (GLOVE) ×2
GLOVE EUDERMIC 7 POWDERFREE (GLOVE) IMPLANT
GLOVE ORTHO TXT STRL SZ7.5 (GLOVE) ×6 IMPLANT
GOWN STRL REUS W/ TWL LRG LVL3 (GOWN DISPOSABLE) ×8 IMPLANT
GOWN STRL REUS W/TWL LRG LVL3 (GOWN DISPOSABLE) ×24
HEMOSTAT POWDER SURGIFOAM 1G (HEMOSTASIS) ×9 IMPLANT
INSERT FOGARTY 61MM (MISCELLANEOUS) IMPLANT
INSERT FOGARTY XLG (MISCELLANEOUS) ×3 IMPLANT
KIT BASIN OR (CUSTOM PROCEDURE TRAY) ×3 IMPLANT
KIT DILATOR VASC 18G NDL (KITS) ×1 IMPLANT
KIT DRAINAGE VACCUM ASSIST (KITS) ×1 IMPLANT
KIT ROOM TURNOVER OR (KITS) ×3 IMPLANT
KIT SUCTION CATH 14FR (SUCTIONS) ×14 IMPLANT
KIT VASOVIEW W/TROCAR VH 2000 (KITS) ×3 IMPLANT
LEAD PACING MYOCARDI (MISCELLANEOUS) ×3 IMPLANT
LINE VENT (MISCELLANEOUS) ×1 IMPLANT
MARKER GRAFT CORONARY BYPASS (MISCELLANEOUS) ×9 IMPLANT
NS IRRIG 1000ML POUR BTL (IV SOLUTION) ×16 IMPLANT
PACK OPEN HEART (CUSTOM PROCEDURE TRAY) ×3 IMPLANT
PAD ARMBOARD 7.5X6 YLW CONV (MISCELLANEOUS) ×4 IMPLANT
PAD ELECT DEFIB RADIOL ZOLL (MISCELLANEOUS) ×3 IMPLANT
PENCIL BUTTON HOLSTER BLD 10FT (ELECTRODE) ×3 IMPLANT
PUNCH AORTIC ROTATE 4.0MM (MISCELLANEOUS) IMPLANT
PUNCH AORTIC ROTATE 4.5MM 8IN (MISCELLANEOUS) ×1 IMPLANT
PUNCH AORTIC ROTATE 5MM 8IN (MISCELLANEOUS) IMPLANT
SET CARDIOPLEGIA MPS 5001102 (MISCELLANEOUS) ×1 IMPLANT
SOLUTION ANTI FOG 6CC (MISCELLANEOUS) IMPLANT
SPONGE GAUZE 4X4 12PLY STER LF (GAUZE/BANDAGES/DRESSINGS) ×2 IMPLANT
SPONGE LAP 18X18 X RAY DECT (DISPOSABLE) ×2 IMPLANT
SPONGE LAP 4X18 X RAY DECT (DISPOSABLE) ×1 IMPLANT
STOPCOCK 4 WAY LG BORE MALE ST (IV SETS) ×1 IMPLANT
SUCKER INTRACARDIAC WEIGHTED (SUCKER) ×1 IMPLANT
SUT BONE WAX W31G (SUTURE) ×3 IMPLANT
SUT ETHIBOND X763 2 0 SH 1 (SUTURE) ×7 IMPLANT
SUT MNCRL AB 3-0 PS2 18 (SUTURE) ×6 IMPLANT
SUT MNCRL AB 4-0 PS2 18 (SUTURE) ×1 IMPLANT
SUT PDS AB 1 CTX 36 (SUTURE) ×6 IMPLANT
SUT PROLENE 2 0 SH DA (SUTURE) IMPLANT
SUT PROLENE 3 0 SH DA (SUTURE) ×4 IMPLANT
SUT PROLENE 3 0 SH1 36 (SUTURE) IMPLANT
SUT PROLENE 4 0 RB 1 (SUTURE) ×6
SUT PROLENE 4 0 SH DA (SUTURE) ×1 IMPLANT
SUT PROLENE 4-0 RB1 .5 CRCL 36 (SUTURE) IMPLANT
SUT PROLENE 5 0 C 1 36 (SUTURE) IMPLANT
SUT PROLENE 6 0 C 1 30 (SUTURE) IMPLANT
SUT PROLENE 7 0 BV 1 (SUTURE) ×1 IMPLANT
SUT PROLENE 7.0 RB 3 (SUTURE) ×17 IMPLANT
SUT PROLENE 8 0 BV175 6 (SUTURE) ×8 IMPLANT
SUT PROLENE BLUE 7 0 (SUTURE) ×3 IMPLANT
SUT PROLENE POLY MONO (SUTURE) IMPLANT
SUT SILK  1 MH (SUTURE) ×2
SUT SILK 1 MH (SUTURE) ×2 IMPLANT
SUT STEEL 6MS V (SUTURE) IMPLANT
SUT STEEL STERNAL CCS#1 18IN (SUTURE) ×1 IMPLANT
SUT STEEL SZ 6 DBL 3X14 BALL (SUTURE) ×2 IMPLANT
SUT VIC AB 1 CTX 36 (SUTURE)
SUT VIC AB 1 CTX36XBRD ANBCTR (SUTURE) IMPLANT
SUT VIC AB 2-0 CT1 27 (SUTURE) ×3
SUT VIC AB 2-0 CT1 TAPERPNT 27 (SUTURE) IMPLANT
SUT VIC AB 2-0 CTX 27 (SUTURE) IMPLANT
SUT VIC AB 3-0 SH 27 (SUTURE)
SUT VIC AB 3-0 SH 27X BRD (SUTURE) IMPLANT
SUT VIC AB 3-0 X1 27 (SUTURE) IMPLANT
SUT VICRYL 4-0 PS2 18IN ABS (SUTURE) IMPLANT
SUTURE E-PAK OPEN HEART (SUTURE) ×3 IMPLANT
SYSTEM SAHARA CHEST DRAIN ATS (WOUND CARE) ×3 IMPLANT
TAPE CLOTH SURG 4X10 WHT LF (GAUZE/BANDAGES/DRESSINGS) ×1 IMPLANT
TAPE PAPER 2X10 WHT MICROPORE (GAUZE/BANDAGES/DRESSINGS) ×1 IMPLANT
TAPE UMBILICAL COTTON 1/8X30 (MISCELLANEOUS) ×1 IMPLANT
TOWEL OR 17X24 6PK STRL BLUE (TOWEL DISPOSABLE) ×6 IMPLANT
TOWEL OR 17X26 10 PK STRL BLUE (TOWEL DISPOSABLE) ×6 IMPLANT
TRAY CATH LUMEN 1 20CM STRL (SET/KITS/TRAYS/PACK) ×1 IMPLANT
TRAY FOLEY IC TEMP SENS 16FR (CATHETERS) ×3 IMPLANT
TUBING INSUFFLATION (TUBING) ×3 IMPLANT
UNDERPAD 30X30 INCONTINENT (UNDERPADS AND DIAPERS) ×3 IMPLANT
WATER STERILE IRR 1000ML POUR (IV SOLUTION) ×6 IMPLANT

## 2014-07-06 NOTE — Progress Notes (Signed)
Attempted wean on CPAP pt respirations to 30.  Unable to tolerate at this time.

## 2014-07-06 NOTE — Progress Notes (Signed)
NIF -40cmH20, and VC 1.9L (>10cc/kg).

## 2014-07-06 NOTE — Anesthesia Postprocedure Evaluation (Signed)
  Anesthesia Post-op Note  Patient: Reginald Gutierrez  Procedure(s) Performed: Procedure(s): CORONARY ARTERY BYPASS GRAFTING (CABG) x4 using left internal mammary artery and bilateral thigh greater saphenous veins (N/A) INTRAOPERATIVE TRANSESOPHAGEAL ECHOCARDIOGRAM (N/A) PATENT FORAMEN OVALE CLOSURE (N/A)  Patient Location: SICU  Anesthesia Type:General  Level of Consciousness: sedated, unresponsive and Patient remains intubated per anesthesia plan  Airway and Oxygen Therapy: Patient remains intubated per anesthesia plan and Patient placed on Ventilator (see vital sign flow sheet for setting)  Post-op Pain: none  Post-op Assessment: Post-op Vital signs reviewed, Patient's Cardiovascular Status Stable, Respiratory Function Stable, No signs of Nausea or vomiting and Pain level controlled  Post-op Vital Signs: Reviewed and stable  Last Vitals:  Filed Vitals:   07/06/14 1545  BP:   Pulse: 86  Temp: 36.9 C  Resp: 23    Complications: No apparent anesthesia complications

## 2014-07-06 NOTE — Anesthesia Preprocedure Evaluation (Signed)
Anesthesia Evaluation  Patient identified by MRN, date of birth, ID band Patient awake    Reviewed: Allergy & Precautions, H&P , NPO status , Patient's Chart, lab work & pertinent test results  History of Anesthesia Complications (+) history of anesthetic complications  Airway Mallampati: IV  TM Distance: >3 FB Neck ROM: Limited    Dental  (+) Dental Advisory Given   Pulmonary sleep apnea , COPDCurrent Smoker,  H/o trach< pt doesn't know why breath sounds clear to auscultation        Cardiovascular hypertension, Pt. on medications and Pt. on home beta blockers + CAD (severe 3v ASCADz, EF 30-35%) and + Past MI (a week ago) Rhythm:Regular Rate:Normal     Neuro/Psych Anxiety Depression negative neurological ROS     GI/Hepatic negative GI ROS, Neg liver ROS, (+)     substance abuse (narcotics)  cocaine use,   Endo/Other  diabetes (glu 286), Type 1, Insulin DependentHypothyroidism   Renal/GU Renal disease (h/o ARF requiring dialysis 2013, resolve, K+ 5.5)     Musculoskeletal   Abdominal   Peds  Hematology  (+) Blood dyscrasia (Hb 11.6), ,   Anesthesia Other Findings   Reproductive/Obstetrics                             Anesthesia Physical Anesthesia Plan  ASA: IV  Anesthesia Plan: General   Post-op Pain Management:    Induction: Intravenous  Airway Management Planned: Oral ETT and Video Laryngoscope Planned  Additional Equipment: Arterial line, CVP, PA Cath, 3D TEE, Ultrasound Guidance Line Placement and TEE  Intra-op Plan:   Post-operative Plan: Post-operative intubation/ventilation  Informed Consent: I have reviewed the patients History and Physical, chart, labs and discussed the procedure including the risks, benefits and alternatives for the proposed anesthesia with the patient or authorized representative who has indicated his/her understanding and acceptance.   Dental  advisory given  Plan Discussed with: CRNA and Surgeon  Anesthesia Plan Comments: (Plan routine monitors, A line, PA cath, GETA with TEE and post op ventilation)        Anesthesia Quick Evaluation

## 2014-07-06 NOTE — Progress Notes (Signed)
Pt able to follow commands and lift head off bed spontaneously, rapid weaning procedure explained to Pt with nodding of understanding. VSS with minimal CTD

## 2014-07-06 NOTE — Anesthesia Procedure Notes (Addendum)
Anesthesia Procedure Note PA catheter:  Routine monitors. Timeout, sterile prep, drape, FBP R neck.  Trendelenburg position.  1% Lido local, finder and trocar RIJ 1st pass with US guidance.  2-lumen Cordis placed over J wire. PA catheter in easily.  Sterile dressing applied.  Patient tolerated well, VSS.  Jenita Seashore, MD  [phone] Procedure Name: Intubation Date/Time: 07/06/2014 7:50 AM Performed by: Maude Leriche D Pre-anesthesia Checklist: Patient identified, Emergency Drugs available, Suction available, Patient being monitored and Timeout performed Patient Re-evaluated:Patient Re-evaluated prior to inductionOxygen Delivery Method: Circle system utilized Preoxygenation: Pre-oxygenation with 100% oxygen Intubation Type: IV induction Ventilation: Mask ventilation without difficulty and Oral airway inserted - appropriate to patient size Laryngoscope size: Elective Glidescope. Grade View: Grade I Tube type: Oral Tube size: 8.0 mm Number of attempts: 1 Airway Equipment and Method: Stylet Placement Confirmation: ETT inserted through vocal cords under direct vision,  positive ETCO2 and breath sounds checked- equal and bilateral Secured at: 23 cm Tube secured with: Tape Dental Injury: Teeth and Oropharynx as per pre-operative assessment

## 2014-07-06 NOTE — Transfer of Care (Signed)
Immediate Anesthesia Transfer of Care Note  Patient: Reginald Gutierrez  Procedure(s) Performed: Procedure(s): CORONARY ARTERY BYPASS GRAFTING (CABG) x4 using left internal mammary artery and bilateral thigh greater saphenous veins (N/A) INTRAOPERATIVE TRANSESOPHAGEAL ECHOCARDIOGRAM (N/A) PATENT FORAMEN OVALE CLOSURE (N/A)  Patient Location: SICU  Anesthesia Type:General  Level of Consciousness: sedated  Airway & Oxygen Therapy: Patient remains intubated per anesthesia plan and Patient placed on Ventilator (see vital sign flow sheet for setting)  Post-op Assessment: Report given to PACU RN and Post -op Vital signs reviewed and stable  Post vital signs: Reviewed and stable  Complications: No apparent anesthesia complications

## 2014-07-06 NOTE — Brief Op Note (Addendum)
07/03/2014 - 07/06/2014  12:00 PM  PATIENT:  Reginald Gutierrez  48 y.o. male  PRE-OPERATIVE DIAGNOSIS:  CAD, PFO  POST-OPERATIVE DIAGNOSIS:  CAD, PFO  PROCEDURE:   CORONARY ARTERY BYPASS GRAFTING x 4 (LIMA-LAD, SVG-D, SVG-OM1, SVG-RPL) ENDOSCOPIC VEIN HARVEST BILATERAL THIGHS CLOSURE OF PATENT FORAMEN OVALE   SURGEON:    Purcell Nails, MD  ASSISTANTS:  Coral Ceo, PA-C  ANESTHESIA:   Germaine Pomfret, MD  CROSSCLAMP TIME:   57'  CARDIOPULMONARY BYPASS TIME: 141'  FINDINGS:  Moderate LV systolic dysfunction with severe hypokinesis of inferior wall and inferior septum  Small patent foramen ovale with left to right shunt  Good quality LIMA conduit for grafting  Good quality SVG conduit for grafting  Diffuse CAD with fair to poor target vessels for grafting  COMPLICATIONS: None  BASELINE WEIGHT: 80.5 kg  PATIENT DISPOSITION:   TO SICU IN STABLE CONDITION  [name] 07/06/2014 1:51 PM

## 2014-07-06 NOTE — Progress Notes (Signed)
Patient ID: Reginald Gutierrez, male   DOB: 1965/11/03, 48 y.o.   MRN: [id number] EVENING ROUNDS NOTE :     [address]       [name] [address]             [phone]                 Day of Surgery Procedure(s) (LRB): CORONARY ARTERY BYPASS GRAFTING (CABG) x4 using left internal mammary artery and bilateral thigh greater saphenous veins (N/A) INTRAOPERATIVE TRANSESOPHAGEAL ECHOCARDIOGRAM (N/A) PATENT FORAMEN OVALE CLOSURE (N/A)  Total Length of Stay:  LOS: 3 days  BP 107/59 mmHg  Pulse 86  Temp(Src) 100.8 F (38.2 C) (Oral)  Resp 19  Ht 5' 6.14" (1.68 m)  Wt 177 lb 8 oz (80.513 kg)  BMI 28.53 kg/m2  SpO2 98%  .Intake/Output      11/06 0701 - 11/07 0700   P.O.    I.V. (mL/kg) 4919.4 (61.1)   Blood 554   IV Piggyback 650   Total Intake(mL/kg) 6123.4 (76.1)   Urine (mL/kg/hr) 1690 (1.7)   Blood 1250 (1.3)   Chest Tube 130 (0.1)   Total Output 3070   Net +3053.4         . sodium chloride 20 mL/hr at 07/06/14 1900  . sodium chloride 20 mL/hr at 07/06/14 1900  . sodium chloride 250 mL (07/06/14 1900)  . dexmedetomidine 0.3 mcg/kg/hr (07/06/14 1900)  . DOPamine 1.988 mcg/kg/min (07/06/14 1900)  . insulin (NOVOLIN-R) infusion 0.6 Units/hr (07/06/14 1900)  . lactated ringers 20 mL/hr at 07/06/14 1900  . nitroGLYCERIN    . phenylephrine (NEO-SYNEPHRINE) Adult infusion 30 mcg/min (07/06/14 1900)     Lab Results  Component Value Date   WBC 14.1* 07/06/2014   HGB 10.7* 07/06/2014   HCT 32.3* 07/06/2014   PLT 193 07/06/2014   GLUCOSE 118* 07/06/2014   CHOL 147 07/05/2014   TRIG 128 07/05/2014   HDL 52 07/05/2014   LDLCALC 69 07/05/2014   ALT 26 07/05/2014   AST 25 07/05/2014   NA 139 07/06/2014   K 4.4 07/06/2014   CL 102 07/06/2014   CREATININE 0.80 07/06/2014   BUN 14 07/06/2014   CO2 25 07/06/2014   TSH 16.480* 07/03/2014   INR 1.38 07/06/2014   HGBA1C 6.5* 07/05/2014   MICROALBUR 2.9* 05/18/2013   Opens eyes but sleepy, did not tolerate  first attempt to wean vent with increased respiratory rate.   Delight Ovens MD  Beeper [phone] Office [phone] 07/06/2014 7:09 PM

## 2014-07-06 NOTE — Progress Notes (Signed)
  Echocardiogram Echocardiogram Transesophageal has been performed.  Georgian Co 07/06/2014, 8:20 AM

## 2014-07-06 NOTE — Op Note (Signed)
CARDIOTHORACIC SURGERY OPERATIVE NOTE  Date of Procedure: 07/06/2014  Preoperative Diagnosis:   Severe 3-vessel Coronary Artery Disease  Postoperative Diagnosis:   Severe 3-vessel Coronary Artery Disease  Patent Foramen Ovale  Procedure:    Coronary Artery Bypass Grafting x 4   Left Internal Mammary Artery to Distal Left Anterior Descending Coronary Artery  Saphenous Vein Graft to Right Posterolateral Branch Coronary Artery  Saphenous Vein Graft to First Obtuse Marginal Branch of Left Circumflex Coronary Artery  Sapheonous Vein Graft to First Diagonal Branch Coronary Artery  Endoscopic Vein Harvest from Bilateral Thighs   Closure of Patent Foramen Ovale   Surgeon: Salvatore Decent. Cornelius Moras, MD  Assistant: Coral Ceo, PA-C  Anesthesia: Germaine Pomfret, MD  Operative Findings:  Moderate LV systolic dysfunction with severe hypokinesis of inferior wall and inferior septum  Small patent foramen ovale with left to right shunt  Good quality LIMA conduit for grafting  Good quality SVG conduit for grafting  Diffuse CAD with fair to poor target vessels for grafting    BRIEF CLINICAL NOTE AND INDICATIONS FOR SURGERY  Patient is a 48 year old disabled single white male from [place], [place] referred for possible surgical treatment of severe three-vessel coronary artery disease with ischemic cardiomyopathy. The patient has multiple medical problems including severe three-vessel coronary artery disease, chronic systolic congestive heart failure, hypertension, type 1 diabetes mellitus, hyperlipidemia, long-standing tobacco abuse, remote history of cocaine abuse, and chronic pain with continuous narcotic dependence. He was originally diagnosed with coronary artery disease in 2013. At that time he presented with an acute non-ST segment elevation myocardial infarction. Diagnostic cardiac catheterization was performed demonstrating severe three-vessel coronary artery disease.  Surgical revascularization was recommended, but the patient refused and was never evaluated by a surgeon. He remained stable from a cardiac standpoint since that time but had not been followed regularly by a cardiologist. He was seen by his primary care physician and urged to reestablish care with a cardiologist. He was seen by Dr. Katrinka Blazing on 01/25/2014, and repeat diagnostic cardiac catheterization was recommended. Catheterization was notable for the presence of severe three-vessel coronary artery disease with moderate global left ventricular systolic dysfunction. The patient was referred to consider surgical revascularization and seen in consultation 02/14/2014.  Coronary artery bypass surgery was recommended, but the patient declined.  He subsequently presented acutely on 07/03/2014 with and acute NSTEMI.  He underwent repeat cardiac catheterization and follow up surgical consultation was requested.  The patient has been seen in consultation and counseled at length regarding the indications, risks and potential benefits of surgery.  All questions have been answered, and the patient provides full informed consent for the operation as described.   DETAILS OF THE OPERATIVE PROCEDURE  Preparation:  The patient is brought to the operating room on the above mentioned date and central monitoring was established by the anesthesia team including placement of Swan-Ganz catheter and radial arterial line. The patient is placed in the supine position on the operating table.  Intravenous antibiotics are administered. General endotracheal anesthesia is induced uneventfully. A Foley catheter is placed.  Baseline transesophageal echocardiogram was performed.  Findings were notable for moderate global LV systolic dysfunction.  There was severe inferior wall hypokinesis.  There was a small patent foramen ovale with left to right shunting.  There was mild mitral regurgitation.  The patient's chest, abdomen, both groins,  and both lower extremities are prepared and draped in a sterile manner. A time out procedure is performed.   Surgical Approach and Conduit Harvest:  A median sternotomy incision was performed and the left internal mammary artery is dissected from the chest wall and prepared for bypass grafting. The left internal mammary artery is notably good quality conduit. Simultaneously, saphenous vein is obtained from the patient's right thigh using endoscopic vein harvest technique. The saphenous vein is notably good quality conduit, but the vein became small close to the knee.  An additional segment of saphenous vein was removed from the left thigh using endoscopic vein harvest technique.  This vein was good quality conduit. After removal of the saphenous vein, the small surgical incisions in the lower extremity are closed with absorbable suture. Following systemic heparinization, the left internal mammary artery was transected distally noted to have excellent flow.   Extracorporeal Cardiopulmonary Bypass and Myocardial Protection:  The pericardium is opened. The ascending aorta is normal in appearance. The ascending aorta is cannulated for cardioplegia bypass.  The right common femoral vein is cannulated with the Seldinger technique and a 22 French long femoral venous cannula is advanced under TEE guidance into the right atrium.  Adequate heparinization is verified.     The entire pre-bypass portion of the operation was notable for stable hemodynamics.  Cardiopulmonary bypass was begun.  A second venous cannula is placed through the right atrium into the superior vena cava.  The surface of the heart is inspected. Distal target vessels are selected for coronary artery bypass grafting. A cardioplegia cannula is placed in the ascending aorta.  A temperature probe was placed in the interventricular septum.  The patient is allowed to cool passively to [facility] systemic temperature.  The aortic cross clamp is applied  and cold blood cardioplegia is delivered initially in an antegrade fashion through the aortic root.  Iced saline slush is applied for topical hypothermia.  The initial cardioplegic arrest is rapid with early diastolic arrest.  Repeat doses of cardioplegia are administered intermittently throughout the entire cross clamp portion of the operation through the aortic root and through subsequently placed vein grafts in order to maintain completely flat electrocardiogram and septal myocardial temperature below 15C.  Myocardial protection was felt to be excellent.  Coronary Artery Bypass Grafting:   The posterolateral branch of the distal right coronary artery was grafted using a reversed saphenous vein graft in an end-to-side fashion.  At the site of distal anastomosis the target vessel was poor quality and measured approximately 1.0 mm in diameter.  The first obtuse marginal branch of the left circumflex coronary artery was grafted using a reversed saphenous vein graft in an end-to-side fashion.  At the site of distal anastomosis the target vessel was good quality and measured approximately 2.0 mm in diameter.  The first diagonal branch of the left anterior descending coronary artery was grafted using a reversed saphenous vein graft in an end-to-side fashion.  At the site of distal anastomosis the target vessel was fair to good quality and measured approximately 1.5 mm in diameter.  The distal left anterior coronary artery was grafted with the left internal mammary artery in an end-to-side fashion.  At the site of distal anastomosis the target vessel was fair quality and measured approximately 1.5 mm in diameter.  All proximal vein graft anastomoses were placed directly to the ascending aorta prior to removal of the aortic cross clamp.  The septal myocardial temperature rose rapidly after reperfusion of the left internal mammary artery graft.  The aortic cross clamp was removed after a total cross clamp  time of 98 minutes.   Closure of Patent  Foramen Ovale:  The inferior vena cava cannula was pulled down until the tip was just below the junction between the right atrium and the inferior vena cava. An umbilical tape is placed around these appear vena cava.  An oblique incision is made in the right atrium. The intra-atrial septum is inspected and a small patent foramen ovale is easily identified. The patent foramen ovale is closed using a single 4-0 Prolene suture.  The right atriotomy incision is closed using a 2 layer closure of running 4-0 Prolene suture.   Procedure Completion:  All proximal and distal coronary anastomoses were inspected for hemostasis and appropriate graft orientation. Epicardial pacing wires are fixed to the right ventricular outflow tract and to the right atrial appendage. The patient is rewarmed to 37C temperature. The patient is weaned and disconnected from cardiopulmonary bypass.  The patient's rhythm at separation from bypass was sinus.  The patient was weaned from cardioplegic bypass without any inotropic support. Total cardiopulmonary bypass time for the operation was 141 minutes.  Followup transesophageal echocardiogram performed after separation from bypass revealed no changes from the preoperative exam, although LV systolic function appeared improved.  The aortic and venous cannula were removed uneventfully. Protamine was administered to reverse the anticoagulation. The mediastinum and pleural space were inspected for hemostasis and irrigated with saline solution. The mediastinum and the left pleural space were drained using 3 chest tubes placed through separate stab incisions inferiorly.  The soft tissues anterior to the aorta were reapproximated loosely. The sternum is closed with double strength sternal wire. The soft tissues anterior to the sternum were closed in multiple layers and the skin is closed with a running subcuticular skin closure.  The post-bypass  portion of the operation was notable for stable rhythm and hemodynamics.  No blood products were administered during the operation.   Disposition:  The patient tolerated the procedure well and is transported to the surgical intensive care in stable condition. There are no intraoperative complications. All sponge instrument and needle counts are verified correct at completion of the operation.    Salvatore Decentlarence H. Cornelius Moraswen MD 07/06/2014 1:58 PM

## 2014-07-06 NOTE — Procedures (Signed)
Extubation Procedure Note  Patient Details:   Name: PLEDGER DEBENEDETTI DOB: 04/18/1966 MRN: [id number]   Airway Documentation:  Airway 8 mm (Active)  Secured at (cm) 23 cm 07/06/2014  7:13 PM  Measured From Lips 07/06/2014  7:13 PM  Secured Location Right 07/06/2014  7:13 PM  Secured By Pink Tape 07/06/2014  7:13 PM  Site Condition Cool;Dry 07/06/2014  7:13 PM    Evaluation  O2 sats: stable throughout Complications: No apparent complications Patient did tolerate procedure well. Bilateral Breath Sounds: Rhonchi, Diminished Suctioning: Oral, Airway Yes   Cuff leak performed, with audible/ventilatory leak present. Pt extubated to 5lpm 02 (humidified). Pt able to speak, alert/oriented, and VS stable throughout. Bilateral lungs c/d, and no stridor noted.   Shonna Chock 07/06/2014, 10:34 PM

## 2014-07-06 NOTE — Progress Notes (Signed)
  Patient educated about wearing the sacral prophylactic dressing during his procedure. He refused to wear it.   Burchett, Valente David, RN

## 2014-07-06 NOTE — Progress Notes (Signed)
Patient was in OR this am and did not get to see him. Now post op. Discussed with Dr. Cornelius Moras who advised that [facility] can sign off at this time. Please call us for any further assistance.  Marcellus Scott, MD, FACP, FHM. [facility] Pager [phone]  If 7PM-7AM, please contact night-coverage [email or web address] Password [email or web address] 07/06/2014, 4:20 PM

## 2014-07-07 ENCOUNTER — Inpatient Hospital Stay (HOSPITAL_COMMUNITY): Payer: Medicare Other

## 2014-07-07 LAB — POCT I-STAT 3, ART BLOOD GAS (G3+)
Acid-base deficit: 2 mmol/L (ref 0.0–2.0)
Bicarbonate: 23.9 mEq/L (ref 20.0–24.0)
O2 SAT: 94 %
PCO2 ART: 42.7 mmHg (ref 35.0–45.0)
Patient temperature: 37
TCO2: 25 mmol/L (ref 0–100)
pH, Arterial: 7.356 (ref 7.350–7.450)
pO2, Arterial: 74 mmHg — ABNORMAL LOW (ref 80.0–100.0)

## 2014-07-07 LAB — POCT I-STAT, CHEM 8
BUN: 18 mg/dL (ref 6–23)
CHLORIDE: 101 meq/L (ref 96–112)
CREATININE: 1.2 mg/dL (ref 0.50–1.35)
Calcium, Ion: 1.15 mmol/L (ref 1.12–1.23)
GLUCOSE: 196 mg/dL — AB (ref 70–99)
HCT: 29 % — ABNORMAL LOW (ref 39.0–52.0)
HEMOGLOBIN: 9.9 g/dL — AB (ref 13.0–17.0)
POTASSIUM: 5.1 meq/L (ref 3.7–5.3)
SODIUM: 134 meq/L — AB (ref 137–147)
TCO2: 22 mmol/L (ref 0–100)

## 2014-07-07 LAB — GLUCOSE, CAPILLARY
GLUCOSE-CAPILLARY: 109 mg/dL — AB (ref 70–99)
GLUCOSE-CAPILLARY: 122 mg/dL — AB (ref 70–99)
GLUCOSE-CAPILLARY: 129 mg/dL — AB (ref 70–99)
GLUCOSE-CAPILLARY: 123 mg/dL — AB (ref 70–99)
GLUCOSE-CAPILLARY: 120 mg/dL — AB (ref 70–99)
GLUCOSE-CAPILLARY: 195 mg/dL — AB (ref 70–99)
Glucose-Capillary: 109 mg/dL — ABNORMAL HIGH (ref 70–99)
Glucose-Capillary: 121 mg/dL — ABNORMAL HIGH (ref 70–99)
Glucose-Capillary: 82 mg/dL (ref 70–99)
Glucose-Capillary: 95 mg/dL (ref 70–99)
Glucose-Capillary: 133 mg/dL — ABNORMAL HIGH (ref 70–99)
Glucose-Capillary: 143 mg/dL — ABNORMAL HIGH (ref 70–99)
Glucose-Capillary: 111 mg/dL — ABNORMAL HIGH (ref 70–99)
Glucose-Capillary: 118 mg/dL — ABNORMAL HIGH (ref 70–99)
Glucose-Capillary: 123 mg/dL — ABNORMAL HIGH (ref 70–99)
Glucose-Capillary: 173 mg/dL — ABNORMAL HIGH (ref 70–99)

## 2014-07-07 LAB — MAGNESIUM
Magnesium: 2.4 mg/dL (ref 1.5–2.5)
Magnesium: 2.1 mg/dL (ref 1.5–2.5)

## 2014-07-07 LAB — CBC
HCT: 28.3 % — ABNORMAL LOW (ref 39.0–52.0)
HEMATOCRIT: 29.2 % — AB (ref 39.0–52.0)
HEMOGLOBIN: 9.8 g/dL — AB (ref 13.0–17.0)
Hemoglobin: 9.6 g/dL — ABNORMAL LOW (ref 13.0–17.0)
MCH: 30.2 pg (ref 26.0–34.0)
MCH: 30.5 pg (ref 26.0–34.0)
MCHC: 33.6 g/dL (ref 30.0–36.0)
MCHC: 33.9 g/dL (ref 30.0–36.0)
MCV: 89.8 fL (ref 78.0–100.0)
MCV: 89.8 fL (ref 78.0–100.0)
Platelets: 165 10*3/uL (ref 150–400)
Platelets: 181 10*3/uL (ref 150–400)
RBC: 3.25 MIL/uL — AB (ref 4.22–5.81)
RBC: 3.15 MIL/uL — ABNORMAL LOW (ref 4.22–5.81)
RDW: 13.8 % (ref 11.5–15.5)
RDW: 14 % (ref 11.5–15.5)
WBC: 11.4 10*3/uL — ABNORMAL HIGH (ref 4.0–10.5)
WBC: 14.2 10*3/uL — ABNORMAL HIGH (ref 4.0–10.5)

## 2014-07-07 LAB — BASIC METABOLIC PANEL
Anion gap: 10 (ref 5–15)
BUN: 15 mg/dL (ref 6–23)
CALCIUM: 8 mg/dL — AB (ref 8.4–10.5)
CHLORIDE: 104 meq/L (ref 96–112)
CO2: 23 mEq/L (ref 19–32)
CREATININE: 0.99 mg/dL (ref 0.50–1.35)
GFR calc Af Amer: >90 mL/min (ref >90)
GFR calc non Af Amer: >90 mL/min (ref >90)
Glucose, Bld: 85 mg/dL (ref 70–99)
Potassium: 4.9 mEq/L (ref 3.7–5.3)
Sodium: 137 mEq/L (ref 137–147)

## 2014-07-07 LAB — CREATININE, SERUM
CREATININE: 1.15 mg/dL (ref 0.50–1.35)
GFR calc Af Amer: 85 mL/min — ABNORMAL LOW (ref >90)
GFR, EST NON AFRICAN AMERICAN: 74 mL/min — AB (ref >90)

## 2014-07-07 MED ORDER — INSULIN DETEMIR 100 UNIT/ML ~~LOC~~ SOLN: 18.0000 [IU] | Freq: Every day | SUBCUTANEOUS | Status: DC

## 2014-07-07 MED ORDER — INSULIN DETEMIR 100 UNIT/ML ~~LOC~~ SOLN
18.0000 [IU] | Freq: Every day | SUBCUTANEOUS | Status: DC
  Administered 2014-07-07 – 2014-07-11 (×5): 18 [IU] via SUBCUTANEOUS
  Filled 2014-07-07 (×5): qty 0.18

## 2014-07-07 MED ORDER — SODIUM CHLORIDE 0.9 % IV SOLN: INTRAVENOUS | Status: DC | PRN

## 2014-07-07 MED ORDER — ALBUTEROL SULFATE (2.5 MG/3ML) 0.083% IN NEBU: 2.5000 mg | INHALATION_SOLUTION | RESPIRATORY_TRACT | Status: DC | PRN

## 2014-07-07 MED ORDER — FENTANYL 50 MCG/HR TD PT72
50.0000 ug | MEDICATED_PATCH | TRANSDERMAL | Status: DC
Start: 2014-07-07 — End: 2014-07-11
  Administered 2014-07-07 – 2014-07-10 (×2): 50 ug via TRANSDERMAL
  Filled 2014-07-07 (×3): qty 2

## 2014-07-07 MED ORDER — INSULIN ASPART 100 UNIT/ML ~~LOC~~ SOLN
0.0000 [IU] | SUBCUTANEOUS | Status: DC
  Administered 2014-07-07 – 2014-07-08 (×3): 4 [IU] via SUBCUTANEOUS
  Administered 2014-07-08 (×2): 2 [IU] via SUBCUTANEOUS
  Administered 2014-07-08: 4 [IU] via SUBCUTANEOUS

## 2014-07-07 NOTE — Progress Notes (Signed)
Patient ID: Reginald Gutierrez, male   DOB: Feb 16, 1966, 48 y.o.   MRN: [id number] EVENING ROUNDS NOTE :     [address]       [name] [address]             [phone]                 1 Day Post-Op Procedure(s) (LRB): CORONARY ARTERY BYPASS GRAFTING (CABG) x4 using left internal mammary artery and bilateral thigh greater saphenous veins (N/A) INTRAOPERATIVE TRANSESOPHAGEAL ECHOCARDIOGRAM (N/A) PATENT FORAMEN OVALE CLOSURE (N/A)  Total Length of Stay:  LOS: 4 days  BP 109/94 mmHg  Pulse 82  Temp(Src) 97.8 F (36.6 C) (Oral)  Resp 20  Ht 5' 6.14" (1.68 m)  Wt 184 lb 11.9 oz (83.8 kg)  BMI 29.69 kg/m2  SpO2 97%  .Intake/Output      11/06 0701 - 11/07 0700 11/07 0701 - 11/08 0700   P.O. 240 720   I.V. (mL/kg) 6338.9 (75.6) 289.2 (3.5)   Blood 554    NG/GT 90    IV Piggyback 950 50   Total Intake(mL/kg) 8172.9 (97.5) 1059.2 (12.6)   Urine (mL/kg/hr) 2600 (1.3) 405 (0.4)   Blood 1250 (0.6)    Chest Tube 440 (0.2) 140 (0.1)   Total Output 4290 545   Net +3882.9 +514.2          . sodium chloride 20 mL/hr at 07/06/14 1900  . sodium chloride 20 mL/hr at 07/06/14 1900  . dexmedetomidine Stopped (07/07/14 1200)  . insulin (NOVOLIN-R) infusion Stopped (07/07/14 1400)  . lactated ringers 20 mL/hr (07/07/14 0110)  . nitroGLYCERIN    . phenylephrine (NEO-SYNEPHRINE) Adult infusion Stopped (07/07/14 0400)     Lab Results  Component Value Date   WBC 14.2* 07/07/2014   HGB 9.9* 07/07/2014   HCT 29.0* 07/07/2014   PLT 181 07/07/2014   GLUCOSE 196* 07/07/2014   CHOL 147 07/05/2014   TRIG 128 07/05/2014   HDL 52 07/05/2014   LDLCALC 69 07/05/2014   ALT 26 07/05/2014   AST 25 07/05/2014   NA 134* 07/07/2014   K 5.1 07/07/2014   CL 101 07/07/2014   CREATININE 1.20 07/07/2014   BUN 18 07/07/2014   CO2 23 07/07/2014   TSH 16.480* 07/03/2014   INR 1.38 07/06/2014   HGBA1C 6.5* 07/05/2014   MICROALBUR 2.9* 05/18/2013   Weaning odd dopamine, d/c a  Line in  wrist [place] and alert   Delight Ovens MD  Beeper [phone] Office [phone] 07/07/2014 6:28 PM

## 2014-07-07 NOTE — Progress Notes (Addendum)
Patient ID: SANTINO KINSELLA, male   DOB: 04/27/1966, 48 y.o.   MRN: [id number] T[facility] DAILY ICU PROGRESS NOTE                   [address]            [name] [address]          [phone]   1 Day Post-Op Procedure(s) (LRB): CORONARY ARTERY BYPASS GRAFTING (CABG) x4 using left internal mammary artery and bilateral thigh greater saphenous veins (N/A) INTRAOPERATIVE TRANSESOPHAGEAL ECHOCARDIOGRAM (N/A) PATENT FORAMEN OVALE CLOSURE (N/A)  Total Length of Stay:  LOS: 4 days   Subjective: Extubated, awake  and neuro  intact  Objective: Vital signs in last 24 hours: Temp:  [98.2 F (36.8 C)-100.8 F (38.2 C)] 98.2 F (36.8 C) (11/07 0800) Pulse Rate:  [79-95] 94 (11/07 0828) Cardiac Rhythm:  [-] Normal sinus rhythm (11/07 0800) Resp:  [12-34] 16 (11/07 0828) BP: (91-123)/(40-70) 110/62 mmHg (11/07 0828) SpO2:  [91 %-100 %] 91 % (11/07 0828) Arterial Line BP: (91-132)/(49-69) 118/62 mmHg (11/07 0800) FiO2 (%):  [40 %-60 %] 40 % (11/06 2153) Weight:  [184 lb 11.9 oz (83.8 kg)] 184 lb 11.9 oz (83.8 kg) (11/07 0530)  Filed Weights   07/05/14 0420 07/06/14 0424 07/07/14 0530  Weight: 173 lb 1.6 oz (78.518 kg) 177 lb 8 oz (80.513 kg) 184 lb 11.9 oz (83.8 kg)    Weight change: 7 lb 3.9 oz (3.287 kg)   Hemodynamic parameters for last 24 hours: PAP: (30-49)/(15-34) 41/25 mmHg CO:  [1.6 L/min-5.1 L/min] 4.5 L/min CI:  [1.6 L/min/m2-2.6 L/min/m2] 2.3 L/min/m2  Intake/Output from previous day: 11/06 0701 - 11/07 0700 In: 8172.9 [P.O.:240; I.V.:6338.9; Blood:554; NG/GT:90; IV Piggyback:950] Out: 4290 [Urine:2600; Blood:1250; Chest Tube:440]  Intake/Output this shift: Total I/O In: 43.8 [I.V.:43.8] Out: 175 [Urine:115; Chest Tube:60]  Current Meds: Scheduled Meds: . acetaminophen  1,000 mg Oral 4 times per day   Or  . acetaminophen (TYLENOL) oral liquid 160 mg/5 mL  1,000 mg Per Tube 4 times per day  . albuterol  2.5 mg Nebulization Q4H  . antiseptic oral rinse   7 mL Mouth Rinse QID  . ARIPiprazole  5 mg Oral BID  . aspirin EC  325 mg Oral Daily   Or  . aspirin  324 mg Per Tube Daily  . atorvastatin  40 mg Oral q1800  . bisacodyl  10 mg Oral Daily   Or  . bisacodyl  10 mg Rectal Daily  . cefUROXime (ZINACEF)  IV  1.5 g Intravenous Q12H  . chlorhexidine  15 mL Mouth Rinse BID  . docusate sodium  200 mg Oral Daily  . DULoxetine  40 mg Oral Daily  . fentaNYL  50 mcg Transdermal Q72H  . insulin regular  0-10 Units Intravenous TID WC  . levothyroxine  125 mcg Oral QAC breakfast  . metoprolol tartrate  12.5 mg Oral BID   Or  . metoprolol tartrate  12.5 mg Per Tube BID  . [START ON 07/08/2014] pantoprazole  40 mg Oral Daily  . sodium chloride  3 mL Intravenous Q12H   Continuous Infusions: . sodium chloride 20 mL/hr at 07/06/14 1900  . sodium chloride 20 mL/hr at 07/06/14 1900  . dexmedetomidine 0.1 mcg/kg/hr (07/07/14 0800)  . DOPamine 1.988 mcg/kg/min (07/06/14 1900)  . insulin (NOVOLIN-R) infusion 1.3 Units/hr (07/07/14 0800)  . lactated ringers 20 mL/hr (07/07/14 0110)  . nitroGLYCERIN    . phenylephrine (NEO-SYNEPHRINE) Adult  infusion Stopped (07/07/14 0400)   PRN Meds:.Place/Maintain arterial line **AND** sodium chloride, Place/Maintain arterial line **AND** sodium chloride, albumin human, metoprolol, midazolam, morphine injection, ondansetron (ZOFRAN) IV, oxyCODONE, sodium chloride  General appearance: alert, cooperative, appears older than stated age and no distress Neurologic: intact Heart: regular rate and rhythm, S1, S2 normal, no murmur, click, rub or gallop Lungs: diminished breath sounds bibasilar Abdomen: soft, non-tender; bowel sounds normal; no masses,  no organomegaly Extremities: extremities normal, atraumatic, no cyanosis or edema and Homans sign is negative, no sign of DVT Wound: sternum inatct  Lab Results: CBC: Recent Labs  07/06/14 1954 07/06/14 1955 07/07/14 0331  WBC 12.5*  --  11.4*  HGB 10.3* 9.9* 9.8*    HCT 30.6* 29.0* 29.2*  PLT 173  --  165   BMET:  Recent Labs  07/06/14 0300  07/06/14 1955 07/07/14 0331  NA 139  < > 135* 137  K 5.5*  < > 4.9 4.9  CL 102  < > 108 104  CO2 25  --   --  23  GLUCOSE 286*  < > 143* 85  BUN 15  < > 14 15  CREATININE 1.01  < > 1.00 0.99  CALCIUM 8.8  --   --  8.0*  < > = values in this interval not displayed.  PT/INR:  Recent Labs  07/06/14 1445  LABPROT 17.2*  INR 1.38   Radiology: Dg Chest Port 1 View  07/07/2014   CLINICAL DATA:  48 year old male status post multivessel coronary artery bypass grafting. Subsequent encounter.  EXAM: PORTABLE CHEST - 1 VIEW  COMPARISON:  Most recent prior chest x-ray 07/06/2014  FINDINGS: Interval extubation and removal of nasogastric tube. Right IJ vascular sheath continues to convey Swan-Ganz catheter into the heart. The tip of the Swan overlies the right main pulmonary outflow tract. Patient is status post median sternotomy with evidence of multivessel CABG. Left chest tube and mediastinal drain are in unchanged position. Inspiratory volumes may be slightly lower following extubation. Persistent pulmonary vascular congestion without overt edema. Right upper lung opacity seen on prior chest x-ray less conspicuous on today's exam and favored to reflect shifting atelectasis. No pneumothorax. No acute osseous abnormality.  IMPRESSION: 1. Interval extubation and removal of nasogastric tube. 2. Previously described right upper lobe asymmetric opacity is less conspicuous on today's examination and is therefore favored to reflect shifting atelectasis rather than developing pneumonia. 3. Other support apparatus remain in stable and satisfactory position. 4. Persistent pulmonary vascular congestion bordering on edema.   Electronically Signed   By: Malachy Moan M.D.   On: 07/07/2014 07:30   Dg Chest Port 1 View  07/06/2014   CLINICAL DATA:  48 year old male with shortness of breath. Atelectasis. Status post CABG and closure  of patent foramen ovale today. Initial encounter.  EXAM: PORTABLE CHEST - 1 VIEW  COMPARISON:  07/03/2014 and earlier.  FINDINGS: Portable AP supine view at 1436 hrs. Intubated. Endotracheal tube tip at the carina. Enteric tube looped in the left upper quadrant. Right IJ approach Swan-Ganz catheter, tip at the main pulmonary artery level. Mediastinal drain and 2 left chest tubes.  Sequelae of CABG. Stable cardiac size and mediastinal contours. No pneumothorax or pulmonary edema. Vague right upper lung opacity. Additional perihilar streaky opacity which most resembles atelectasis.  IMPRESSION: 1. Endotracheal tube tip at the carina. Retract 2 cm for more optimal placement. 2. Adequate placement of other lines and tubes, Swan-Ganz catheter tip at the main pulmonary artery level. 3.  Perihilar atelectasis but more vague increased opacity in the right upper lung. Attention directed on followup to exclude developing pneumonia.   Electronically Signed   By: Augusto GambleLee  Hall M.D.   On: 07/06/2014 14:51     Assessment/Plan: S/P Procedure(s) (LRB): CORONARY ARTERY BYPASS GRAFTING (CABG) x4 using left internal mammary artery and bilateral thigh greater saphenous veins (N/A) INTRAOPERATIVE TRANSESOPHAGEAL ECHOCARDIOGRAM (N/A) PATENT FORAMEN OVALE CLOSURE (N/A) Mobilize Diuresis Diabetes control d/c tubes/lines Continue foley due to strict I&O, patient in ICU and urinary output monitoring See progression orders Expected Acute  Blood - loss Anemia Use fentanyl patch for pain control with long history of narcotic use Elevated tsh 16- on 125 synthroid    Mallissa Lorenzen B 07/07/2014 9:20 AM

## 2014-07-07 NOTE — Plan of Care (Addendum)
Problem: Phase II - Intermediate Post-Op Goal: Wean to Extubate Outcome: Completed/Met Date Met:  07/07/14 Pt with delayed extubation d/t Hx of COPD and failed 1st attempt with tachypnea. Extubated at 2315 with 2nd attempt and passed parameters, extubated to 5L [place], C/DB exercises completed. Pt tolerated extubation, will cont' to monitor and assess.

## 2014-07-07 NOTE — Plan of Care (Signed)
Problem: Phase II - Intermediate Post-Op Goal: Maintain Hemodynamic Stability Outcome: Completed/Met Date Met:  07/07/14 Goal: CBGs/Blood Glucose per SCIP Criteria Outcome: Completed/Met Date Met:  07/07/14 Goal: Advance Diet Outcome: Completed/Met Date Met:  07/07/14

## 2014-07-08 ENCOUNTER — Inpatient Hospital Stay (HOSPITAL_COMMUNITY): Payer: Medicare Other

## 2014-07-08 LAB — GLUCOSE, CAPILLARY
GLUCOSE-CAPILLARY: 157 mg/dL — AB (ref 70–99)
GLUCOSE-CAPILLARY: 166 mg/dL — AB (ref 70–99)
GLUCOSE-CAPILLARY: 33 mg/dL — AB (ref 70–99)
GLUCOSE-CAPILLARY: 36 mg/dL — AB (ref 70–99)
Glucose-Capillary: 183 mg/dL — ABNORMAL HIGH (ref 70–99)
Glucose-Capillary: 113 mg/dL — ABNORMAL HIGH (ref 70–99)
Glucose-Capillary: 132 mg/dL — ABNORMAL HIGH (ref 70–99)
Glucose-Capillary: 27 mg/dL — CL (ref 70–99)

## 2014-07-08 LAB — CBC
HCT: 26.2 % — ABNORMAL LOW (ref 39.0–52.0)
Hemoglobin: 9.1 g/dL — ABNORMAL LOW (ref 13.0–17.0)
MCH: 31.4 pg (ref 26.0–34.0)
MCHC: 34.7 g/dL (ref 30.0–36.0)
MCV: 90.3 fL (ref 78.0–100.0)
Platelets: 172 10*3/uL (ref 150–400)
RBC: 2.9 MIL/uL — ABNORMAL LOW (ref 4.22–5.81)
RDW: 14.1 % (ref 11.5–15.5)
WBC: 14.3 10*3/uL — ABNORMAL HIGH (ref 4.0–10.5)

## 2014-07-08 LAB — BASIC METABOLIC PANEL
Anion gap: 10 (ref 5–15)
Anion gap: 12 (ref 5–15)
BUN: 20 mg/dL (ref 6–23)
BUN: 20 mg/dL (ref 6–23)
CO2: 24 mEq/L (ref 19–32)
CO2: 25 mEq/L (ref 19–32)
Calcium: 8.5 mg/dL (ref 8.4–10.5)
Calcium: 8.4 mg/dL (ref 8.4–10.5)
Chloride: 101 mEq/L (ref 96–112)
Chloride: 100 mEq/L (ref 96–112)
Creatinine, Ser: 1.06 mg/dL (ref 0.50–1.35)
Creatinine, Ser: 1.14 mg/dL (ref 0.50–1.35)
GFR calc Af Amer: >90 mL/min (ref >90)
GFR calc Af Amer: 86 mL/min — ABNORMAL LOW (ref >90)
GFR calc non Af Amer: 81 mL/min — ABNORMAL LOW (ref >90)
GFR calc non Af Amer: 74 mL/min — ABNORMAL LOW (ref >90)
Glucose, Bld: 175 mg/dL — ABNORMAL HIGH (ref 70–99)
Glucose, Bld: 167 mg/dL — ABNORMAL HIGH (ref 70–99)
Potassium: 5.9 mEq/L — ABNORMAL HIGH (ref 3.7–5.3)
Potassium: 4.6 mEq/L (ref 3.7–5.3)
Sodium: 135 mEq/L — ABNORMAL LOW (ref 137–147)
Sodium: 137 mEq/L (ref 137–147)

## 2014-07-08 MED ORDER — FUROSEMIDE 10 MG/ML IJ SOLN
40.0000 mg | Freq: Once | INTRAMUSCULAR | Status: AC
  Administered 2014-07-08: 40 mg via INTRAVENOUS
  Filled 2014-07-08: qty 4

## 2014-07-08 MED ORDER — ENOXAPARIN SODIUM 30 MG/0.3ML ~~LOC~~ SOLN
30.0000 mg | SUBCUTANEOUS | Status: DC
  Administered 2014-07-08: 30 mg via SUBCUTANEOUS
  Filled 2014-07-08 (×2): qty 0.3

## 2014-07-08 MED ORDER — INSULIN ASPART 100 UNIT/ML ~~LOC~~ SOLN: 0.0000 [IU] | Freq: Three times a day (TID) | SUBCUTANEOUS | Status: DC

## 2014-07-08 MED ORDER — DEXTROSE 50 % IV SOLN
50.0000 mL | Freq: Once | INTRAVENOUS | Status: AC | PRN
  Administered 2014-07-08: 50 mL via INTRAVENOUS

## 2014-07-08 MED ORDER — DEXTROSE 50 % IV SOLN
INTRAVENOUS | Status: AC
  Administered 2014-07-08: 50 mL via INTRAVENOUS
  Filled 2014-07-08: qty 50

## 2014-07-08 MED ORDER — SODIUM POLYSTYRENE SULFONATE 15 GM/60ML PO SUSP
15.0000 g | Freq: Once | ORAL | Status: AC
  Administered 2014-07-08: 15 g via ORAL
  Filled 2014-07-08: qty 60

## 2014-07-08 NOTE — Progress Notes (Signed)
Patient ID: Reginald Gutierrez, male   DOB: 03-21-1966, 48 y.o.   MRN: [id number] EVENING ROUNDS NOTE :     [address]       [name] [address]             [phone]                 2 Days Post-Op Procedure(s) (LRB): CORONARY ARTERY BYPASS GRAFTING (CABG) x4 using left internal mammary artery and bilateral thigh greater saphenous veins (N/A) INTRAOPERATIVE TRANSESOPHAGEAL ECHOCARDIOGRAM (N/A) PATENT FORAMEN OVALE CLOSURE (N/A)  Total Length of Stay:  LOS: 5 days  BP 137/54 mmHg  Pulse 84  Temp(Src) 97.4 F (36.3 C) (Oral)  Resp 19  Ht 5' 6.14" (1.68 m)  Wt 186 lb 15.2 oz (84.8 kg)  BMI 30.05 kg/m2  SpO2 99%  .Intake/Output      11/08 0701 - 11/09 0700   P.O. 540   I.V. (mL/kg) 240 (2.8)   IV Piggyback    Total Intake(mL/kg) 780 (9.2)   Urine (mL/kg/hr) 1230 (1.2)   Chest Tube    Total Output 1230   Net -450         . sodium chloride 20 mL/hr (07/08/14 0427)  . sodium chloride 20 mL/hr at 07/06/14 1900  . dexmedetomidine Stopped (07/07/14 1200)  . lactated ringers Stopped (07/08/14 0400)  . nitroGLYCERIN    . phenylephrine (NEO-SYNEPHRINE) Adult infusion Stopped (07/07/14 0400)     Lab Results  Component Value Date   WBC 14.3* 07/08/2014   HGB 9.1* 07/08/2014   HCT 26.2* 07/08/2014   PLT 172 07/08/2014   GLUCOSE 167* 07/08/2014   CHOL 147 07/05/2014   TRIG 128 07/05/2014   HDL 52 07/05/2014   LDLCALC 69 07/05/2014   ALT 26 07/05/2014   AST 25 07/05/2014   NA 137 07/08/2014   K 4.6 07/08/2014   CL 100 07/08/2014   CREATININE 1.14 07/08/2014   BUN 20 07/08/2014   CO2 25 07/08/2014   TSH 16.480* 07/03/2014   INR 1.38 07/06/2014   HGBA1C 6.5* 07/05/2014   MICROALBUR 2.9* 05/18/2013   Better day, ambulated, repeat k 4.6, additional dose of lasix tonight D/c foley and central line in am  Delight Ovens MD  Beeper [phone] Office [phone] 07/08/2014 7:02 PM

## 2014-07-08 NOTE — Progress Notes (Signed)
RN alerted to Pt's finger stick CBG of 34, re-checked performed from venous sample from IJ cordis revealing a CBG of 33; Pt states that their "sugar drops like that and I don't even know it", Pt provided with 360 cc apple juice, re-check CBG after 15 minutes. Will continue to monitor and assess.

## 2014-07-08 NOTE — Progress Notes (Signed)
Hypoglycemic Event  CBG: 33  Treatment: 15 GM carbohydrate snack  Symptoms: None  Follow-up CBG: Time: 2220 CBG Result 27  Possible Reasons for Event: Unknown  Comments/MD notified:  MD aware Pt a brittle diabetic, Severe hypolycemia order implemented with 50 cc D50 IVP given. See new note.     Jeri Modena  Remember to initiate Hypoglycemia Order Set & complete

## 2014-07-08 NOTE — Progress Notes (Signed)
Hypoglycemic Event  CBG: 27  Treatment: D50 IV 50 mL  Symptoms: None  Follow-up CBG: Time: 2250 CBG Result: 124  Possible Reasons for Event: Unknown  Comments/MD notified: MD to be notified during rounds    Reginald Gutierrez  Remember to initiate Hypoglycemia Order Set & complete

## 2014-07-08 NOTE — Progress Notes (Signed)
Patient ID: Reginald Gutierrez, male   DOB: Oct 26, 1965, 48 y.o.   MRN: [id number] TCTS DAILY ICU PROGRESS NOTE                   [address]            [name] [address]          [phone]   2 Days Post-Op Procedure(s) (LRB): CORONARY ARTERY BYPASS GRAFTING (CABG) x4 using left internal mammary artery and bilateral thigh greater saphenous veins (N/A) INTRAOPERATIVE TRANSESOPHAGEAL ECHOCARDIOGRAM (N/A) PATENT FORAMEN OVALE CLOSURE (N/A)  Total Length of Stay:  LOS: 5 days   Subjective: Better pain control this am, better night  Objective: Vital signs in last 24 hours: Temp:  [97.2 F (36.2 C)-98.2 F (36.8 C)] 97.8 F (36.6 C) (11/08 0700) Pulse Rate:  [58-104] 80 (11/08 0700) Cardiac Rhythm:  [-] Normal sinus rhythm (11/08 0730) Resp:  [12-27] 15 (11/08 0700) BP: (105-142)/(57-94) 112/67 mmHg (11/08 0700) SpO2:  [93 %-100 %] 100 % (11/08 0700) Arterial Line BP: (121-163)/(64-77) 141/66 mmHg (11/07 2100) Weight:  [186 lb 15.2 oz (84.8 kg)] 186 lb 15.2 oz (84.8 kg) (11/08 0600)  Filed Weights   07/06/14 0424 07/07/14 0530 07/08/14 0600  Weight: 177 lb 8 oz (80.513 kg) 184 lb 11.9 oz (83.8 kg) 186 lb 15.2 oz (84.8 kg)    Weight change: 2 lb 3.3 oz (1 kg)   Hemodynamic parameters for last 24 hours: PAP: (43-50)/(25-30) 50/30 mmHg  Intake/Output from previous day: 11/07 0701 - 11/08 0700 In: 2069.2 [P.O.:1440; I.V.:529.2; IV Piggyback:100] Out: 1025 [Urine:885; Chest Tube:140]  Intake/Output this shift:    Current Meds: Scheduled Meds: . acetaminophen  1,000 mg Oral 4 times per day   Or  . acetaminophen (TYLENOL) oral liquid 160 mg/5 mL  1,000 mg Per Tube 4 times per day  . antiseptic oral rinse  7 mL Mouth Rinse QID  . ARIPiprazole  5 mg Oral BID  . aspirin EC  325 mg Oral Daily   Or  . aspirin  324 mg Per Tube Daily  . atorvastatin  40 mg Oral q1800  . bisacodyl  10 mg Oral Daily   Or  . bisacodyl  10 mg Rectal Daily  . docusate sodium  200  mg Oral Daily  . DULoxetine  40 mg Oral Daily  . fentaNYL  50 mcg Transdermal Q72H  . insulin aspart  0-24 Units Subcutaneous 6 times per day  . insulin detemir  18 Units Subcutaneous Daily  . levothyroxine  125 mcg Oral QAC breakfast  . metoprolol tartrate  12.5 mg Oral BID   Or  . metoprolol tartrate  12.5 mg Per Tube BID  . pantoprazole  40 mg Oral Daily  . sodium chloride  3 mL Intravenous Q12H   Continuous Infusions: . sodium chloride 20 mL/hr (07/08/14 0427)  . sodium chloride 20 mL/hr at 07/06/14 1900  . dexmedetomidine Stopped (07/07/14 1200)  . lactated ringers Stopped (07/08/14 0400)  . nitroGLYCERIN    . phenylephrine (NEO-SYNEPHRINE) Adult infusion Stopped (07/07/14 0400)   PRN Meds:.Place/Maintain arterial line **AND** sodium chloride, Place/Maintain arterial line **AND** sodium chloride, albuterol, metoprolol, ondansetron (ZOFRAN) IV, oxyCODONE, sodium chloride  General appearance: alert, cooperative and no distress Neurologic: intact Heart: regular rate and rhythm, S1, S2 normal, no murmur, click, rub or gallop Lungs: diminished breath sounds bibasilar Abdomen: soft, non-tender; bowel sounds normal; no masses,  no organomegaly Extremities: extremities normal, atraumatic, no cyanosis or  edema and Homans sign is negative, no sign of DVT Wound: sternal dressing intact  Lab Results: CBC: Recent Labs  07/07/14 1715 07/07/14 1724 07/08/14 0300  WBC 14.2*  --  14.3*  HGB 9.6* 9.9* 9.1*  HCT 28.3* 29.0* 26.2*  PLT 181  --  172   BMET:  Recent Labs  07/07/14 0331  07/07/14 1724 07/08/14 0300  NA 137  --  134* 135*  K 4.9  --  5.1 5.9*  CL 104  --  101 101  CO2 23  --   --  24  GLUCOSE 85  --  196* 175*  BUN 15  --  18 20  CREATININE 0.99  < > 1.20 1.06  CALCIUM 8.0*  --   --  8.5  < > = values in this interval not displayed.  PT/INR:  Recent Labs  07/06/14 1445  LABPROT 17.2*  INR 1.38   Radiology: Dg Chest Port 1 View  07/08/2014   CLINICAL  DATA:  Followup post CABG  EXAM: PORTABLE CHEST - 1 VIEW  COMPARISON:  07/07/2014  FINDINGS: Right IJ Swan-Ganz catheter has been removed and the sheath remains in place. Evidence of CABG reidentified. Chest tube and mediastinal drains have been removed without pneumothorax. Slight increase in patchy retrocardiac airspace opacity with small pleural effusions remaining.  IMPRESSION: Increased retrocardiac airspace opacity, possibly atelectasis although pneumonia or other alveolar filling processes could appear similar.   Electronically Signed   By: Christiana PellantGretchen  Green M.D.   On: 07/08/2014 07:47   Dg Chest Port 1 View  07/07/2014   CLINICAL DATA:  48 year old male status post multivessel coronary artery bypass grafting. Subsequent encounter.  EXAM: PORTABLE CHEST - 1 VIEW  COMPARISON:  Most recent prior chest x-ray 07/06/2014  FINDINGS: Interval extubation and removal of nasogastric tube. Right IJ vascular sheath continues to convey Swan-Ganz catheter into the heart. The tip of the Swan overlies the right main pulmonary outflow tract. Patient is status post median sternotomy with evidence of multivessel CABG. Left chest tube and mediastinal drain are in unchanged position. Inspiratory volumes may be slightly lower following extubation. Persistent pulmonary vascular congestion without overt edema. Right upper lung opacity seen on prior chest x-ray less conspicuous on today's exam and favored to reflect shifting atelectasis. No pneumothorax. No acute osseous abnormality.  IMPRESSION: 1. Interval extubation and removal of nasogastric tube. 2. Previously described right upper lobe asymmetric opacity is less conspicuous on today's examination and is therefore favored to reflect shifting atelectasis rather than developing pneumonia. 3. Other support apparatus remain in stable and satisfactory position. 4. Persistent pulmonary vascular congestion bordering on edema.   Electronically Signed   By: Malachy MoanHeath  McCullough M.D.   On:  07/07/2014 07:30   Dg Chest Port 1 View  07/06/2014   CLINICAL DATA:  48 year old male with shortness of breath. Atelectasis. Status post CABG and closure of patent foramen ovale today. Initial encounter.  EXAM: PORTABLE CHEST - 1 VIEW  COMPARISON:  07/03/2014 and earlier.  FINDINGS: Portable AP supine view at 1436 hrs. Intubated. Endotracheal tube tip at the carina. Enteric tube looped in the left upper quadrant. Right IJ approach Swan-Ganz catheter, tip at the main pulmonary artery level. Mediastinal drain and 2 left chest tubes.  Sequelae of CABG. Stable cardiac size and mediastinal contours. No pneumothorax or pulmonary edema. Vague right upper lung opacity. Additional perihilar streaky opacity which most resembles atelectasis.  IMPRESSION: 1. Endotracheal tube tip at the carina. Retract 2 cm for more  optimal placement. 2. Adequate placement of other lines and tubes, Swan-Ganz catheter tip at the main pulmonary artery level. 3. Perihilar atelectasis but more vague increased opacity in the right upper lung. Attention directed on followup to exclude developing pneumonia.   Electronically Signed   By: Augusto Gamble M.D.   On: 07/06/2014 14:51   Chronic Kidney Disease   Stage I     GFR >90  Stage II    GFR 60-89  Stage IIIA GFR 45-59  Stage IIIB GFR 30-44  Stage IV   GFR 15-29  Stage V    GFR  <15  Lab Results  Component Value Date   CREATININE 1.06 07/08/2014   Estimated Creatinine Clearance: 87.3 mL/min (by C-G formula based on Cr of 1.06).  Assessment/Plan: S/P Procedure(s) (LRB): CORONARY ARTERY BYPASS GRAFTING (CABG) x4 using left internal mammary artery and bilateral thigh greater saphenous veins (N/A) INTRAOPERATIVE TRANSESOPHAGEAL ECHOCARDIOGRAM (N/A) PATENT FORAMEN OVALE CLOSURE (N/A) Mobilize Diuresis Diabetes control Continue foley due to diuresing patient Hyperkalemia- lasix this am and po kayexalate follow up lytes noon Expected postop fluid overload   Aryn Kops  B 07/08/2014 8:41 AM

## 2014-07-08 NOTE — Plan of Care (Signed)
Problem: Phase II - Intermediate Post-Op Goal: Pain controlled with appropriate interventions Outcome: Completed/Met Date Met:  07/08/14

## 2014-07-08 NOTE — Plan of Care (Signed)
Problem: Phase II - Intermediate Post-Op Goal: Activity Progressed Outcome: Completed/Met Date Met:  07/08/14 Pt progressing up to ambulating 300' x 3 with assistance without difficulty. Goal: Patient advanced to Phase III: Barriers addressed Outcome: Completed/Met Date Met:  07/08/14

## 2014-07-09 ENCOUNTER — Inpatient Hospital Stay (HOSPITAL_COMMUNITY): Payer: Medicare Other

## 2014-07-09 LAB — BASIC METABOLIC PANEL
Anion gap: 9 (ref 5–15)
BUN: 24 mg/dL — ABNORMAL HIGH (ref 6–23)
CO2: 29 mEq/L (ref 19–32)
Calcium: 8 mg/dL — ABNORMAL LOW (ref 8.4–10.5)
Chloride: 98 mEq/L (ref 96–112)
Creatinine, Ser: 1.12 mg/dL (ref 0.50–1.35)
GFR calc Af Amer: 88 mL/min — ABNORMAL LOW (ref >90)
GFR calc non Af Amer: 76 mL/min — ABNORMAL LOW (ref >90)
Glucose, Bld: 89 mg/dL (ref 70–99)
Potassium: 4.4 mEq/L (ref 3.7–5.3)
Sodium: 136 mEq/L — ABNORMAL LOW (ref 137–147)

## 2014-07-09 LAB — GLUCOSE, CAPILLARY
GLUCOSE-CAPILLARY: 124 mg/dL — AB (ref 70–99)
GLUCOSE-CAPILLARY: 48 mg/dL — AB (ref 70–99)
GLUCOSE-CAPILLARY: 312 mg/dL — AB (ref 70–99)
GLUCOSE-CAPILLARY: 308 mg/dL — AB (ref 70–99)
GLUCOSE-CAPILLARY: 289 mg/dL — AB (ref 70–99)
Glucose-Capillary: 21 mg/dL — CL (ref 70–99)
Glucose-Capillary: 124 mg/dL — ABNORMAL HIGH (ref 70–99)
Glucose-Capillary: 98 mg/dL (ref 70–99)
Glucose-Capillary: 23 mg/dL — CL (ref 70–99)
Glucose-Capillary: 33 mg/dL — CL (ref 70–99)
Glucose-Capillary: 294 mg/dL — ABNORMAL HIGH (ref 70–99)
Glucose-Capillary: 287 mg/dL — ABNORMAL HIGH (ref 70–99)

## 2014-07-09 LAB — CBC
HCT: 24.3 % — ABNORMAL LOW (ref 39.0–52.0)
Hemoglobin: 8.1 g/dL — ABNORMAL LOW (ref 13.0–17.0)
MCH: 31 pg (ref 26.0–34.0)
MCHC: 33.3 g/dL (ref 30.0–36.0)
MCV: 93.1 fL (ref 78.0–100.0)
Platelets: 173 10*3/uL (ref 150–400)
RBC: 2.61 MIL/uL — ABNORMAL LOW (ref 4.22–5.81)
RDW: 14.4 % (ref 11.5–15.5)
WBC: 11.1 10*3/uL — ABNORMAL HIGH (ref 4.0–10.5)

## 2014-07-09 MED ORDER — GLUCOSE 40 % PO GEL
ORAL | Status: AC
  Administered 2014-07-09: 37.5 g
  Filled 2014-07-09: qty 1

## 2014-07-09 MED ORDER — LORAZEPAM 0.5 MG PO TABS: 0.5000 mg | ORAL_TABLET | Freq: Four times a day (QID) | ORAL | Status: DC | PRN

## 2014-07-09 MED ORDER — INSULIN ASPART 100 UNIT/ML ~~LOC~~ SOLN
0.0000 [IU] | Freq: Three times a day (TID) | SUBCUTANEOUS | Status: DC
  Administered 2014-07-09: 5 [IU] via SUBCUTANEOUS
  Administered 2014-07-10: 2 [IU] via SUBCUTANEOUS
  Administered 2014-07-10: 1 [IU] via SUBCUTANEOUS
  Administered 2014-07-10: 3 [IU] via SUBCUTANEOUS
  Administered 2014-07-11: 2 [IU] via SUBCUTANEOUS
  Administered 2014-07-11: 5 [IU] via SUBCUTANEOUS

## 2014-07-09 MED ORDER — SODIUM CHLORIDE 0.9 % IJ SOLN
3.0000 mL | Freq: Two times a day (BID) | INTRAMUSCULAR | Status: DC
Start: 2014-07-09 — End: 2014-07-11
  Administered 2014-07-09: 3 mL via INTRAVENOUS

## 2014-07-09 MED ORDER — POTASSIUM CHLORIDE CRYS ER 20 MEQ PO TBCR
20.0000 meq | EXTENDED_RELEASE_TABLET | Freq: Every day | ORAL | Status: AC
  Administered 2014-07-10 – 2014-07-11 (×2): 20 meq via ORAL
  Filled 2014-07-09 (×2): qty 1

## 2014-07-09 MED ORDER — LEVOTHYROXINE SODIUM 125 MCG PO TABS
125.0000 ug | ORAL_TABLET | Freq: Every day | ORAL | Status: DC
  Administered 2014-07-10 – 2014-07-11 (×2): 125 ug via ORAL
  Filled 2014-07-09 (×3): qty 1

## 2014-07-09 MED ORDER — SODIUM CHLORIDE 0.9 % IV SOLN: 250.0000 mL | INTRAVENOUS | Status: DC | PRN

## 2014-07-09 MED ORDER — FUROSEMIDE 40 MG PO TABS
40.0000 mg | ORAL_TABLET | Freq: Every day | ORAL | Status: AC
  Administered 2014-07-09 – 2014-07-11 (×3): 40 mg via ORAL
  Filled 2014-07-09 (×3): qty 1

## 2014-07-09 MED ORDER — GLUCOSE 40 % PO GEL: 1.0000 | ORAL | Status: DC | PRN

## 2014-07-09 MED ORDER — INSULIN ASPART 100 UNIT/ML ~~LOC~~ SOLN
0.0000 [IU] | Freq: Three times a day (TID) | SUBCUTANEOUS | Status: DC
  Administered 2014-07-09: 7 [IU] via SUBCUTANEOUS

## 2014-07-09 MED ORDER — LEVOTHYROXINE SODIUM 150 MCG PO TABS
150.0000 ug | ORAL_TABLET | Freq: Every day | ORAL | Status: DC
  Filled 2014-07-09: qty 1

## 2014-07-09 MED ORDER — MOVING RIGHT ALONG BOOK
Freq: Once | Status: AC
  Administered 2014-07-09: 09:00:00
  Filled 2014-07-09: qty 1

## 2014-07-09 MED ORDER — METOPROLOL TARTRATE 12.5 MG HALF TABLET
12.5000 mg | ORAL_TABLET | Freq: Two times a day (BID) | ORAL | Status: DC
  Administered 2014-07-09 – 2014-07-11 (×4): 12.5 mg via ORAL
  Filled 2014-07-09 (×6): qty 1

## 2014-07-09 MED ORDER — INSULIN ASPART 100 UNIT/ML ~~LOC~~ SOLN: 0.0000 [IU] | Freq: Every day | SUBCUTANEOUS | Status: DC

## 2014-07-09 MED ORDER — SODIUM CHLORIDE 0.9 % IJ SOLN: 3.0000 mL | INTRAMUSCULAR | Status: DC | PRN

## 2014-07-09 MED ORDER — FERROUS SULFATE 325 (65 FE) MG PO TABS
325.0000 mg | ORAL_TABLET | Freq: Every day | ORAL | Status: DC
  Administered 2014-07-09 – 2014-07-11 (×3): 325 mg via ORAL
  Filled 2014-07-09 (×4): qty 1

## 2014-07-09 MED ORDER — ASPIRIN EC 81 MG PO TBEC
81.0000 mg | DELAYED_RELEASE_TABLET | Freq: Every day | ORAL | Status: DC
  Administered 2014-07-09 – 2014-07-11 (×3): 81 mg via ORAL
  Filled 2014-07-09 (×3): qty 1

## 2014-07-09 MED FILL — Mannitol IV Soln 20%: INTRAVENOUS | Qty: 500 | Status: AC

## 2014-07-09 MED FILL — Potassium Chloride Inj 2 mEq/ML: INTRAVENOUS | Qty: 40 | Status: AC

## 2014-07-09 MED FILL — Sodium Bicarbonate IV Soln 8.4%: INTRAVENOUS | Qty: 50 | Status: AC

## 2014-07-09 MED FILL — Electrolyte-R (PH 7.4) Solution: INTRAVENOUS | Qty: 1000 | Status: AC

## 2014-07-09 MED FILL — Heparin Sodium (Porcine) Inj 1000 Unit/ML: INTRAMUSCULAR | Qty: 10 | Status: AC

## 2014-07-09 MED FILL — Heparin Sodium (Porcine) Inj 1000 Unit/ML: INTRAMUSCULAR | Qty: 30 | Status: AC

## 2014-07-09 MED FILL — Sodium Chloride IV Soln 0.9%: INTRAVENOUS | Qty: 2000 | Status: AC

## 2014-07-09 MED FILL — Magnesium Sulfate Inj 50%: INTRAMUSCULAR | Qty: 10 | Status: AC

## 2014-07-09 MED FILL — Lidocaine HCl IV Inj 20 MG/ML: INTRAVENOUS | Qty: 5 | Status: AC

## 2014-07-09 NOTE — Progress Notes (Signed)
Patient sleepy and had eaten most of his 1/2 lunch. Rechecked CBG to make sure it had not dropped again. CBG 308.

## 2014-07-09 NOTE — Plan of Care (Signed)
Problem: Phase I - Pre-Op Goal: Point person for discharge identified Outcome: Progressing Reginald Gutierrez. But wants patient to go to rehab Goal: Pain controlled with appropriate interventions Outcome: Progressing Goal: Patient progressed to Phase II; barriers addressed Outcome: Completed/Met Date Met:  07/09/14  Problem: Phase II - Intermediate Post-Op Goal: CBGs/Blood Glucose per SCIP Criteria Outcome: Progressing Blood sugars are ranging from 23-312 today. SSI changed to sensitive and without hs coverage. Low glucose levels treated with glucose gel and juice with sugar. Patient refused anything else for treatment of low glucose levels. MD aware.   Problem: Phase III - Recovery through Discharge Goal: Maintain Hemodynamic Stability Outcome: Progressing SB (45)-SR(60-80) with unifocal pvcs occ to frequent. Maintains blood pressure. Goal: Activity Progressed Outcome: Progressing Up in chair most of day. Ambulated 300-600 ft twice today. Goal: Discharge plan remains appropriate-arrangements made Outcome: Progressing Patient's Reginald Gutierrez is hoping patient can go to rehab for a few weeks. She lives next door and can cook and give meds. Does not feel like she will be physically able to give care around the clock. Care management is aware and discussed during daily rounds.

## 2014-07-09 NOTE — Progress Notes (Signed)
Hypoglycemic Event  CBG:23  Treatment: 15 GM carbohydrate snack(patient insisted on 2 OJ and 4 Packets sugar)  Symptoms: None  Follow-up CBG: NOBS:9628  CBG Result:33  Possible Reasons for Event: Medication regimen: possibly. patient does this at home  Comments/MD notified:yes    Salley Slaughter  Remember to initiate Hypoglycemia Order Set & complete

## 2014-07-09 NOTE — Progress Notes (Addendum)
      [address]       [name] [address]             [phone]        CARDIOTHORACIC SURGERY PROGRESS NOTE   R3 Days Post-Op Procedure(s) (LRB): CORONARY ARTERY BYPASS GRAFTING (CABG) x4 using left internal mammary artery and bilateral thigh greater saphenous veins (N/A) INTRAOPERATIVE TRANSESOPHAGEAL ECHOCARDIOGRAM (N/A) PATENT FORAMEN OVALE CLOSURE (N/A)  Subjective: Feels better.  Mild soreness in chest.  Denies SOB.  Objective: Vital signs: BP Readings from Last 1 Encounters:  07/09/14 112/60   Pulse Readings from Last 1 Encounters:  07/09/14 77   Resp Readings from Last 1 Encounters:  07/09/14 18   Temp Readings from Last 1 Encounters:  07/09/14 98.9 F (37.2 C) Oral    Hemodynamics:    Physical Exam:  Rhythm:   sinus  Breath sounds: clear  Heart sounds:  RRR  Incisions:  Dressing dry, intact  Abdomen:  Soft, non-distended, non-tender  Extremities:  Warm, well-perfused    Intake/Output from previous day: 11/08 0701 - 11/09 0700 In: 1940 [P.O.:1500; I.V.:440] Out: 2240 [Urine:2240] Intake/Output this shift: Total I/O In: 900 [P.O.:900] Out: -   Lab Results:  CBC: Recent Labs  07/08/14 0300 07/09/14 0219  WBC 14.3* 11.1*  HGB 9.1* 8.1*  HCT 26.2* 24.3*  PLT 172 173    BMET:  Recent Labs  07/08/14 1220 07/09/14 0219  NA 137 136*  K 4.6 4.4  CL 100 98  CO2 25 29  GLUCOSE 167* 89  BUN 20 24*  CREATININE 1.14 1.12  CALCIUM 8.4 8.0*     CBG (last 3)   Recent Labs  07/09/14 0229 07/09/14 0807 07/09/14 1216  GLUCAP 98 23* 312*    ABG    Component Value Date/Time   PHART 7.356 07/07/2014 0347   PCO2ART 42.7 07/07/2014 0347   PO2ART 74.0* 07/07/2014 0347   HCO3 23.9 07/07/2014 0347   TCO2 22 07/07/2014 1724   ACIDBASEDEF 2.0 07/07/2014 0347   O2SAT 94.0 07/07/2014 0347    CXR: PORTABLE CHEST - 1 VIEW  COMPARISON: 07/08/2014  FINDINGS: Swan-Ganz introducer has been removed. Prior CABG with  median sternotomy. LEFT chest and mediastinal tubes were removed previously. Retrocardiac opacity persists. Mild vascular congestion appears worse. LEFT pleural effusion.  IMPRESSION: Slight worsening aeration. Increasing vascular congestion. Retrocardiac opacity persists.   Electronically Signed  By: Davonna Belling M.D.  On: 07/09/2014 07:20  Assessment/Plan: S/P Procedure(s) (LRB): CORONARY ARTERY BYPASS GRAFTING (CABG) x4 using left internal mammary artery and bilateral thigh greater saphenous veins (N/A) INTRAOPERATIVE TRANSESOPHAGEAL ECHOCARDIOGRAM (N/A) PATENT FORAMEN OVALE CLOSURE (N/A)  Overall doing well POD3 Maintaining NSR w/ stable BP O2 sats >90% on room air Expected post op acute blood loss anemia, Hgb down slightly today Expected post op volume excess, mild Expected post op atelectasis, mild Type I diabetes mellitus, difficult glycemic control Hypothyroidism Chronic anxiety Chronic pain with narcotic use   Mobilize  Diuresis  Continue to hold ACE-I for now but will restart 1-2 days if BP will allow  Discussed glycemic control via telephone w/ Dr Everardo All - will decrease dose of Levemir and change SSI to sensitive scale with no HS correction - Dr Everardo All will see the patient within 1 week of hospital discharge - he will also f/u elevated TSH level and he recommends no changes in Synthroid at this time  Transfer to floor   [facility] H 07/09/2014 12:49 PM

## 2014-07-09 NOTE — Progress Notes (Signed)
Hypoglycemic Event  CBG: 48  Treatment: 15 GM carbohydrate snack(pt refuses regular protocol. Apple juice w/ 4 sugar packets  Symptoms: None  Follow-up CBG: Time: 0856  CBG Result:124  Possible Reasons for Event: Other: altered schedule and surgery  Comments/MD notified:Dr. Brandon Melnick, Baldo Ash  Remember to initiate Hypoglycemia Order Set & complete

## 2014-07-09 NOTE — Progress Notes (Signed)
Dr. Cornelius Moras notified of Diabetes Coordinator recommendations and CBG up to 312 mg/dl. Order received to sensitive scale.

## 2014-07-09 NOTE — Progress Notes (Signed)
Inpatient Diabetes Program Recommendations  AACE/ADA: New Consensus Statement on Inpatient Glycemic Control (2013)  Target Ranges:  Prepandial:   less than 140 mg/dL      Peak postprandial:   less than 180 mg/dL (1-2 hours)      Critically ill patients:  140 - 180 mg/dL   Reason for Assessment:  Results for Reginald Gutierrez, Reginald Gutierrez (MRN [id number]) as of 07/09/2014 10:32  Ref. Range 07/08/2014 16:18 07/08/2014 21:52 07/08/2014 21:55 07/08/2014 22:19 07/08/2014 22:50 07/09/2014 02:29 07/09/2014 08:07  Glucose-Capillary Latest Range: 70-99 mg/dL 716 (H) 36 (LL) 33 (LL) 27 (LL) 124 (H) 98 23 (LL)   Diabetes history: Type 1 diabetes Outpatient Diabetes medications: Lantus 25 units AM and 5 units q PM.  Novolin R with meals. Current orders for Inpatient glycemic control: TCTS tid with meals and HS, Levemir 18 units daily Note hypoglycemia.  According to history patient does have low CBG's at home also.    MD please consider decreasing Novolog to sensitive tid with meals and decrease Levemir to 12 units daily.  Will need to monitor closely for hypoglycemia.   Discussed with RN.     Thanks, Beryl Meager, RN, BC-ADM Inpatient Diabetes Coordinator Pager [phone]

## 2014-07-09 NOTE — Progress Notes (Signed)
   07/09/14 0945  Vitals  BP (!) 116/49 mmHg  MAP (mmHg) 65  Pulse Rate 70  ECG Heart Rate 71  Resp 18  Oxygen Therapy  SpO2 90 %  Pacing wires pulled per orders at 0943 without difficulty. Sites u. Instructed to stay on bedrest for at least 45 minutes.

## 2014-07-09 NOTE — Progress Notes (Signed)
TCTS BRIEF SICU PROGRESS NOTE  3 Days Post-Op  S/P Procedure(s) (LRB): CORONARY ARTERY BYPASS GRAFTING (CABG) x4 using left internal mammary artery and bilateral thigh greater saphenous veins (N/A) INTRAOPERATIVE TRANSESOPHAGEAL ECHOCARDIOGRAM (N/A) PATENT FORAMEN OVALE CLOSURE (N/A)   Patient remains in SICU all day for unclear reasons NSR w/ stable BP Glycemic control challenging  Plan: Will hold transfer given delays on 2W - this patient is challenging to manage and after hours transfer might increase risk for complications  OWEN,CLARENCE H 07/09/2014 7:23 PM

## 2014-07-09 NOTE — Progress Notes (Signed)
Pt's foley cath D/C'd per MD order, cath intact with removal after bulb deflation. Pt advised of the possibility of slight dysuria with 1st voiding that needs to occur within a 6 hr period, states understand. Next, attention was directed to Rt IJ cordis for removal per MD order. Pt laid flat and sutures removed, cath withdrawn intact with pressure held at site until hemostasis was observed. Petroleum gauze and 4x4 secured to the site and Pt's head re-elevated. Pt tolerated both procedures without difficulty

## 2014-07-09 NOTE — Progress Notes (Signed)
Hypoglycemic Event  CBG: 33  Treatment: 1 tube instant glucose  Symptoms: None  Follow-up CBG: [id number] CBG Result:48  Possible Reasons for Event: Medication regimen: levimir  Comments/MD notified:Dr. Cornelius Moras and Gerhardt notified.    Salley Slaughter  Remember to initiate Hypoglycemia Order Set & complete

## 2014-07-10 ENCOUNTER — Inpatient Hospital Stay (HOSPITAL_COMMUNITY): Payer: Medicare Other

## 2014-07-10 ENCOUNTER — Encounter (HOSPITAL_COMMUNITY): Payer: Self-pay | Admitting: Thoracic Surgery (Cardiothoracic Vascular Surgery)

## 2014-07-10 LAB — CBC
HEMATOCRIT: 24 % — AB (ref 39.0–52.0)
Hemoglobin: 8.1 g/dL — ABNORMAL LOW (ref 13.0–17.0)
MCH: 31.3 pg (ref 26.0–34.0)
MCHC: 33.8 g/dL (ref 30.0–36.0)
MCV: 92.7 fL (ref 78.0–100.0)
Platelets: 248 10*3/uL (ref 150–400)
RBC: 2.59 MIL/uL — AB (ref 4.22–5.81)
RDW: 14.4 % (ref 11.5–15.5)
WBC: 7.8 10*3/uL (ref 4.0–10.5)

## 2014-07-10 LAB — BASIC METABOLIC PANEL
Anion gap: 11 (ref 5–15)
BUN: 24 mg/dL — ABNORMAL HIGH (ref 6–23)
CHLORIDE: 99 meq/L (ref 96–112)
CO2: 27 meq/L (ref 19–32)
CREATININE: 0.88 mg/dL (ref 0.50–1.35)
Calcium: 8.3 mg/dL — ABNORMAL LOW (ref 8.4–10.5)
GFR calc Af Amer: >90 mL/min (ref >90)
GFR calc non Af Amer: >90 mL/min (ref >90)
GLUCOSE: 212 mg/dL — AB (ref 70–99)
Potassium: 4.2 mEq/L (ref 3.7–5.3)
Sodium: 137 mEq/L (ref 137–147)

## 2014-07-10 LAB — GLUCOSE, CAPILLARY
GLUCOSE-CAPILLARY: 179 mg/dL — AB (ref 70–99)
Glucose-Capillary: 222 mg/dL — ABNORMAL HIGH (ref 70–99)
Glucose-Capillary: 109 mg/dL — ABNORMAL HIGH (ref 70–99)

## 2014-07-10 NOTE — Progress Notes (Signed)
CARDIAC REHAB PHASE I   PRE:  Rate/Rhythm: 83 SR  BP:  Supine:   Sitting: 120/50  Standing:    SaO2: 91 RA  MODE:  Ambulation: 350 ft   POST:  Rate/Rhythm: 102  BP:  Supine:   Sitting: 140/70  Standing:    SaO2: 91 RA 1500-1520 On arrival pt in bed aroused easily.Pt walked independently and would not let me hold to him. Pt states" do not touch me." Pt steady and able to walk 3590 feet without c/o. Pt did take a couple of standing rest stops. He likes to lean on chair and this in arm on his arms. I cautioned him not to bear weight on his arms. He states that he is not. Pt to recliner after walk with call light in reach. Instructed him to use IS hourly.  Melina Copa RN 07/10/2014 3:20 PM

## 2014-07-10 NOTE — Clinical Social Work Note (Signed)
CSW received consult, did not have time to see patient, will see tomorrow morning to complete assessment and discuss SNF for rehab.  [name]. Dayzee Trower, MSW, Theresia Majors [phone] 07/10/2014 5:48 PM

## 2014-07-10 NOTE — Progress Notes (Signed)
Dr. Cornelius Moras notified current NSL expires today and IV therapy was unable to start a new IV. IV therapy attempted x2 with doppler. Dr. Cornelius Moras stated that it could be taken out after pacing wires removed.

## 2014-07-10 NOTE — Progress Notes (Signed)
Pt requested to see [facility] MD Elisabeth Pigeon and psychiatrist they had seen in previous admission; Elisabeth Pigeon, MD is not working today-notified family; family does not know the name of the psychiatrist they saw on the last admission-unable to contact MD without name.  Hermina Barters, RN

## 2014-07-10 NOTE — Progress Notes (Signed)
UR Completed.  Kemauri Musa Jane [phone] 07/10/2014  

## 2014-07-10 NOTE — Evaluation (Signed)
Physical Therapy Evaluation Patient Details Name: Reginald Gutierrez MRN: [id number] DOB: 1966/04/22 Today's Date: 07/10/2014   History of Present Illness  48 y.o. male s/p CABG x4 using left internal mammary artery and bilateral thigh greater saphenous veins, and patent foramen ovale closure.  Clinical Impression  Patient is seen following the above procedure and presents with functional limitations due to the deficits listed below (see PT Problem List). Ambulates generally well without an assistive device however requires frequent cues to maintain sternal precautions. Patient will benefit from skilled PT to increase their independence and safety with mobility to allow discharge to the venue listed below.      Follow Up Recommendations SNF    Equipment Recommendations  None recommended by PT    Recommendations for Other Services       Precautions / Restrictions Precautions Precautions: Sternal Precaution Comments: Provided handout and reviewed sternal precautions Restrictions Weight Bearing Restrictions: Yes (Sternal precautions)      Mobility  Bed Mobility Overal bed mobility: Needs Assistance Bed Mobility: Rolling;Sidelying to Sit;Sit to Sidelying Rolling: Supervision Sidelying to sit: Supervision     Sit to sidelying: Supervision General bed mobility comments: Educated on log roll technique for safe sternal precaution adhearance. Able to perform this task without physical assist.  Transfers Overall transfer level: Needs assistance Equipment used: None Transfers: Sit to/from Stand Sit to Stand: Min guard         General transfer comment: Min guard with frequent cues for sternal precautions. Pt with multiple attempts to pull himself up from chair requiring cues to prevent. Performed from chair x3, and bed on lowest setting. Good stability once standing.  Ambulation/Gait Ambulation/Gait assistance: Min guard Ambulation Distance (Feet): 100 Feet (x2) Assistive device:  None Gait Pattern/deviations: Step-through pattern     General Gait Details: min guard for safety. VC to pace himself in order to maintain safe HR and SpO2. No loss of balance noted.   Stairs            Wheelchair Mobility    Modified Rankin (Stroke Patients Only)       Balance Overall balance assessment: Needs assistance Sitting-balance support: No upper extremity supported;Feet supported Sitting balance-Leahy Scale: Good     Standing balance support: No upper extremity supported Standing balance-Leahy Scale: Fair                               Pertinent Vitals/Pain Pain Assessment: 0-10 Pain Score: 5  Pain Location: Sternum Pain Descriptors / Indicators: Dull;Constant Pain Intervention(s): Limited activity within patient's tolerance;Monitored during session;Repositioned;Premedicated before session  SpO2 90% at rest while on room air at start of therapy HR 77 SpO2 94% after ambulating on room air HR 96    Home Living Family/patient expects to be discharged to:: Skilled nursing facility Living Arrangements: Alone Available Help at Discharge: Family;Available PRN/intermittently (mother and sister able to check up on pt.) Type of Home: House Home Access: Stairs to enter Entrance Stairs-Rails: None Entrance Stairs-Number of Steps: 2 Home Layout: One level Home Equipment: Walker - 4 wheels Additional Comments: pt reports  that he lives next door to his mother.    Prior Function Level of Independence: Independent               Hand Dominance   Dominant Hand: Right    Extremity/Trunk Assessment   Upper Extremity Assessment: Defer to OT evaluation  Lower Extremity Assessment: Overall WFL for tasks assessed         Communication   Communication: No difficulties  Cognition Arousal/Alertness: Lethargic;Suspect due to medications Behavior During Therapy: [facility] for tasks assessed/performed Overall Cognitive Status: Within  Functional Limits for tasks assessed                      General Comments      Exercises        Assessment/Plan    PT Assessment Patient needs continued PT services  PT Diagnosis Acute pain   PT Problem List Decreased range of motion;Decreased strength;Decreased activity tolerance;Decreased balance;Decreased mobility;Decreased knowledge of precautions;Cardiopulmonary status limiting activity;Pain  PT Treatment Interventions DME instruction;Gait training;Functional mobility training;Therapeutic activities;Therapeutic exercise;Balance training;Neuromuscular re-education;Patient/family education;Modalities   PT Goals (Current goals can be found in the Care Plan section) Acute Rehab PT Goals Patient Stated Goal: Go to rehab PT Goal Formulation: With patient Time For Goal Achievement: 07/24/14 Potential to Achieve Goals: Good    Frequency Min 2X/week   Barriers to discharge Decreased caregiver support pt lives alone    Co-evaluation               End of Session Equipment Utilized During Treatment: Gait belt Activity Tolerance: Patient tolerated treatment well Patient left: in chair;with call bell/phone within reach Nurse Communication: Mobility status         Time: [id number] PT Time Calculation (min) (ACUTE ONLY): 23 min   Charges:   PT Evaluation $Initial PT Evaluation Tier I: 1 Procedure PT Treatments $Gait Training: 8-22 mins   PT G Codes[place]erton Mount:          Aminta Sakurai S 07/10/2014, 5:03 PM  Sunday SpillersLogan Secor [place], [place]PT [phone]

## 2014-07-10 NOTE — Progress Notes (Signed)
[address]       [place] [id number]             [phone]        CARDIOTHORACIC SURGERY PROGRESS NOTE   R4 Days Post-Op Procedure(s) (LRB): CORONARY ARTERY BYPASS GRAFTING (CABG) x4 using left internal mammary artery and bilateral thigh greater saphenous veins (N/A) INTRAOPERATIVE TRANSESOPHAGEAL ECHOCARDIOGRAM (N/A) PATENT FORAMEN OVALE CLOSURE (N/A)  Subjective: No events overnight. Patient complains of mild chest soreness. Denies any difficulty breathing of SOB. Has no other complaints. States he is feeling better and asks about walking around.   Objective: Vital signs: BP Readings from Last 1 Encounters:  07/10/14 125/50   Pulse Readings from Last 1 Encounters:  07/10/14 61   Resp Readings from Last 1 Encounters:  07/10/14 17   Temp Readings from Last 1 Encounters:  07/10/14 97.5 F (36.4 C) Oral    Hemodynamics:    Physical Exam:  Rhythm:   NSR  Breath sounds: Clear to auscultation bilaterally  Heart sounds:  Normal S1 and S2, RRR  Incisions:  Clean, dry  Abdomen:  Soft, non tender, non distended  Extremities:  Warm, well perfused, non edematous   Intake/Output from previous day: 11/09 0701 - 11/10 0700 In: 1340 [P.O.:1340] Out: 675 [Urine:675] Intake/Output this shift:    Lab Results:  CBC: Recent Labs  07/09/14 0219 07/10/14 0239  WBC 11.1* 7.8  HGB 8.1* 8.1*  HCT 24.3* 24.0*  PLT 173 248    BMET:  Recent Labs  07/09/14 0219 07/10/14 0239  NA 136* 137  K 4.4 4.2  CL 98 99  CO2 29 27  GLUCOSE 89 212*  BUN 24* 24*  CREATININE 1.12 0.88  CALCIUM 8.0* 8.3*     CBG (last 3)   Recent Labs  07/09/14 1412 07/09/14 1717 07/09/14 2132  GLUCAP 308* 287* 289*    ABG    Component Value Date/Time   PHART 7.356 07/07/2014 0347   PCO2ART 42.7 07/07/2014 0347   PO2ART 74.0* 07/07/2014 0347   HCO3 23.9 07/07/2014 0347   TCO2 22 07/07/2014 1724   ACIDBASEDEF 2.0 07/07/2014 0347   O2SAT 94.0 07/07/2014  0347    CXR: EXAM: CHEST 2 VIEW  COMPARISON: Portable chest x-ray of November 9th 2015 and July 08, 2014  FINDINGS: The lungs are well-expanded. There is improved appearance of the left lung base consistent with resolving atelectasis. The pulmonary interstitial edema has decreased. Small amounts of pleural fluid persist bilaterally. The cardiac silhouette remains enlarged. The pulmonary vascularity is more distinct but remains engorged centrally. There are 8 intact sternal wires. Coronary artery bypass markers are visible.  IMPRESSION: There is ongoing improving improvement in the appearance of both lungs. There is residual left basilar atelectasis, small bilateral pleural effusions, and low-grade CHF.   Electronically Signed  By: David SwazilandJordan  On: 07/10/2014 07:42  Assessment/Plan: S/P Procedure(s) (LRB): CORONARY ARTERY BYPASS GRAFTING (CABG) x4 using left internal mammary artery and bilateral thigh greater saphenous veins (N/A) INTRAOPERATIVE TRANSESOPHAGEAL ECHOCARDIOGRAM (N/A) PATENT FORAMEN OVALE CLOSURE (N/A)  Patient progressing well, POD 4. Continues to maintain NSR with stable BP. Plan to move to floor today, waiting for bed availability. Expected post op acute blood loss anemia, Hgb 8.1 this AM Expected post op volume excess, mild Expected post op atelectasis, mild, improving per CXR Type I diabetes mellitus, difficult glycemic control, continue decreased does of Levemir and sensitive scale SSI per Dr. Everardo AllEllison Hypothyroidism, elevated TSH to be  followed by Dr Everardo All as outpatient, who also recommends no change in Synthroid dosage at this time. Chronic anxiety Chronic pain with narcotic use Mobilize Continue Diuresis Consider restarting ACEi today with BP monitoring  Reginald Coil, PA-S2 07/10/2014 8:06 AM  I have seen and examined the patient and agree with the assessment and plan as outlined.  Ready for d/c to SNF later today or tomorrow.   Await disposition per case management team.  Purcell Nails 07/10/2014 8:28 AM

## 2014-07-11 LAB — GLUCOSE, CAPILLARY
GLUCOSE-CAPILLARY: 151 mg/dL — AB (ref 70–99)
Glucose-Capillary: 123 mg/dL — ABNORMAL HIGH (ref 70–99)
Glucose-Capillary: 72 mg/dL (ref 70–99)
Glucose-Capillary: 269 mg/dL — ABNORMAL HIGH (ref 70–99)

## 2014-07-11 MED ORDER — FENTANYL 50 MCG/HR TD PT72: 50.0000 ug | MEDICATED_PATCH | TRANSDERMAL | Status: DC

## 2014-07-11 MED ORDER — LORAZEPAM 0.5 MG PO TABS: 0.5000 mg | ORAL_TABLET | Freq: Four times a day (QID) | ORAL | Status: DC | PRN

## 2014-07-11 MED ORDER — LISINOPRIL 5 MG PO TABS
5.0000 mg | ORAL_TABLET | Freq: Every day | ORAL | Status: DC
Start: 2014-07-11 — End: 2014-07-11
  Administered 2014-07-11: 5 mg via ORAL
  Filled 2014-07-11: qty 1

## 2014-07-11 MED ORDER — LISINOPRIL 5 MG PO TABS: 5.0000 mg | ORAL_TABLET | Freq: Every day | ORAL | Status: DC

## 2014-07-11 MED ORDER — METOPROLOL TARTRATE 12.5 MG HALF TABLET: 12.5000 mg | ORAL_TABLET | Freq: Two times a day (BID) | ORAL | Status: DC

## 2014-07-11 MED ORDER — OXYCODONE HCL 5 MG PO TABS: 5.0000 mg | ORAL_TABLET | ORAL | Status: DC | PRN

## 2014-07-11 NOTE — Discharge Summary (Signed)
[address]       [name] [address]             [phone]              Discharge Summary  Name: Reginald Gutierrez DOB: 09-03-1965 48 y.o. MRN: [id number]   Admission Date: 07/03/2014 Discharge Date:     Admitting Diagnosis: Dyspnea Orthopnea   Discharge Diagnosis:  Non-ST elevation myocardial infarction Acute systolic congestive heart failure Ischemic cardiomyopathy Severe three vessel coronary artery disease Patent foramen ovale Expected postoperative blood loss anemia   Past Medical History  Diagnosis Date  . Diabetes mellitus type 1   . Hyperlipidemia   . Depression   . Umbilical hernia   . Hypertension   . Glaucoma   . CHF (congestive heart failure)   . Hypothyroidism   . Hypoglycemia, unspecified   . Disorder of bone and cartilage, unspecified   . Acute posthemorrhagic anemia   . Anxiety state, unspecified   . Chronic pain syndrome   . Acute respiratory failure   . Dysphagia, oral phase   . Stress fracture of tibia or fibula   . Syncope and collapse   . Coronary artery disease 07/19/2012  . Ischemic cardiomyopathy   . Narcotic dependency, continuous   . Heart attack 12/16/11  . NSTEMI (non-ST elevated myocardial infarction) 12/19/2011; 07/03/2014    ; Hattie Perch 07/03/2014  . S/P CABG x 4 and closure of PFO 07/06/2014    LIMA to LAD, SVG to D1, SVG to OM1, SVG to RPLB, EVH via bilateral thighs    Patient Active Problem List   Diagnosis Date Noted  . S/P CABG x 4 and closure of PFO 07/06/2014  . Acute on chronic systolic congestive heart failure, NYHA class 4 07/03/2014  . Diabetic hyperosmolar non-ketotic state 07/03/2014  . CAD, multiple vessel 07/03/2014  . Hyperkalemia 07/03/2014  . Acute delirium 03/31/2014  . Acute encephalopathy 03/31/2014  . Hypoglycemia 03/31/2014  . Coronary artery disease   . Ischemic cardiomyopathy   . Narcotic dependency, continuous   . Nasal congestion 01/02/2014  . Adjustment disorder with  mixed anxiety and depressed mood 01/02/2014  . Left shoulder pain 01/02/2014  . Other and unspecified hyperlipidemia 08/15/2013  . 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 Heart attack   . Ventral hernia 03/24/2012  . Tracheostomy status 01/26/2012  . OSA (obstructive sleep apnea) 01/26/2012  . Hypoxemia 01/26/2012  . Acute and chronic respiratory failure 01/26/2012  . Encephalopathy acute 12/23/2011  . Acute renal failure 12/22/2011  . Protein calorie malnutrition 12/21/2011  . Acute respiratory failure 12/20/2011  . Aspiration pneumonia 12/20/2011  . NSTEMI (non-ST elevated myocardial infarction) 12/19/2011  . Acute blood loss anemia 12/19/2011  . Tibial fracture 12/18/2011  . TOBACCO ABUSE 09/23/2010  . HYPOTHYROIDISM 05/14/2010  . Type 1 diabetes mellitus 05/14/2010  . HYPERCHOLESTEROLEMIA 05/14/2010  . DEPRESSION 09/01/2003  . HYPERTENSION, BENIGN ESSENTIAL 08/31/1998     Procedures: CORONARY ARTERY BYPASS GRAFTING x 4  - 07/06/2014  Left Internal Mammary Artery to Distal Left Anterior Descending Coronary Artery  Saphenous Vein Graft to Right Posterolateral Branch Coronary Artery  Saphenous Vein Graft to First Obtuse Marginal Branch of Left Circumflex Coronary Artery  Sapheonous Vein Graft to First Diagonal Branch Coronary Artery  Endoscopic Vein Harvest from Bilateral Thighs PATENT FORAMEN OVALE CLOSURE      HPI:  The patient is a 48 y.o. male with a history of severe  three-vessel coronary artery disease, chronic systolic congestive heart failure, hypertension, type 1 diabetes mellitus, hyperlipidemia, long-standing tobacco abuse, remote history of cocaine abuse, and chronic pain with continuous narcotic dependence. He was originally diagnosed with coronary artery disease in 2013. At that time, he presented with an acute non-ST segment elevation myocardial infarction. He was seen in the [facility] office by Dr. Cornelius Moras in June of this year to discuss  surgical options, with CABG discussed at length with patient and mother, at which time the patient wanted to consider all options but was leaning toward surgery. The patient missed his followup appointment, however, and has been lost to care since then.  He presented to [facility] ER at [facility] on the date of admission complaining of dyspnea and orthopnea which had been worsening over 3 days. He was found to be hypoxic, with sats in the 80s. Blood sugars were greater than 500.  Troponin was elevated at 4.98 with pro BNP of 10, 357.  He was seen in cardiology and admitted for evaluation and treatment of NSTEMI and acute systolic CHF.     Hospital Course:  The patient was admitted to [facility] on 07/03/2014. Dr. Cornelius Moras was consulted for reconsideration of CABG. Repeat cardiac catheterization performed by Dr. Katrinka Blazing on 11/4 confirmed severe 3 vessel coronary artery disease with EF 30-35%.  Internal medicine was consulted for optimization of his diabetes and other chronic medical problems. His condition did improve and ultimately he decided to proceed with surgery. All risks, benefits and alternatives of surgery were explained in detail, and the patient agreed to proceed.   The patient was taken to the operating room and underwent the above procedure.  Intraoperatively, a patent foramen ovale was noted on TEE, and this was closed.   The postoperative course has generally been uneventful.  Dr. Everardo All has assisted with optimizing glycemic control and will follow the patient closely post-discharge. He also will address titration of the patient's Synthroid, as his TSH in the hospital was elevated (16.480).  He has been volume overloaded and was started on Lasix, to which he is responding. He has remained in sinus rhythm and his blood pressures have been stable.  The patient is ambulating in the halls without difficulty and tolerating a carb modified diet.  Incisions are healing well.  It is felt that the patient will  require short term SNF placement at discharge. Currently, a bed search is underway.  The patient is medically stable for transfer once a bed is available.     Recent vital signs:  Filed Vitals:   07/11/14 1434  BP: 124/72  Pulse: 74  Temp: 97.6 F (36.4 C)  Resp: 18    Recent laboratory studies:  CBC:  Recent Labs  07/09/14 0219 07/10/14 0239  WBC 11.1* 7.8  HGB 8.1* 8.1*  HCT 24.3* 24.0*  PLT 173 248   BMET:   Recent Labs  07/09/14 0219 07/10/14 0239  NA 136* 137  K 4.4 4.2  CL 98 99  CO2 29 27  GLUCOSE 89 212*  BUN 24* 24*  CREATININE 1.12 0.88  CALCIUM 8.0* 8.3*    PT/INR: No results for input(s): LABPROT, INR in the last 72 hours.    Medication List    TAKE these medications        ACCU-CHEK AVIVA PLUS test strip  Generic drug:  glucose blood  TEST 6 TIMES A DAY AS DIRECTED.     glucose blood test strip  TEST BLOOD SUGAR 6 TIMES  A DAY AS INSTRUCTED BY DR Everardo All     ARIPiprazole 5 MG tablet  Commonly known as:  ABILIFY  Take 2.5 mg by mouth 2 (two) times daily.     aspirin EC 81 MG tablet  Take 81 mg by mouth daily.     atorvastatin 40 MG tablet  Commonly known as:  LIPITOR  take 1 tablet by mouth once daily     DULoxetine HCl 40 MG Cpep  Take 40 mg by mouth daily.     fentaNYL 50 MCG/HR  Commonly known as:  DURAGESIC - dosed mcg/hr  Place 1 patch (50 mcg total) onto the skin every 3 (three) days.     ferrous sulfate 325 (65 FE) MG tablet  Take 325 mg by mouth daily with breakfast.     glucagon 1 MG injection  Commonly known as:  GLUCAGON EMERGENCY  Inject 1 mg into the muscle once as needed (for low blood sugar).     insulin glargine 100 UNIT/ML injection  Commonly known as:  LANTUS  Inject 5-25 Units into the skin See admin instructions. Take 15-25 units in the morning and take 5-15 units at bedtime     insulin regular 100 units/mL injection  Commonly known as:  NOVOLIN R,HUMULIN R  - Inject 2-8 Units into the skin See admin  instructions. Sliding scale.    - Give 2-8 units four times a day     insulin regular 100 units/mL injection  Commonly known as:  NOVOLIN R,HUMULIN R  Inject 2 units 4 times a daily if needed for blood sugar over 250     INSULIN SYRINGE .5CC/29G 29G X 1/2" 0.5 ML Misc  1 Syringe by Does not apply route daily.     Insulin Syringe-Needle U-100 30G X 1/2" 0.5 ML Misc  Commonly known as:  B-D INS SYRINGE 0.5CC/30GX1/2"  Use 3-4 times per day     levothyroxine 125 MCG tablet  Commonly known as:  SYNTHROID, LEVOTHROID  Take 1 tablet (125 mcg total) by mouth daily before breakfast.     lisinopril 5 MG tablet  Commonly known as:  PRINIVIL,ZESTRIL  Take 1 tablet (5 mg total) by mouth daily.     LORazepam 0.5 MG tablet  Commonly known as:  ATIVAN  Take 1 tablet (0.5 mg total) by mouth every 6 (six) hours as needed for anxiety.     metoprolol tartrate 12.5 mg Tabs tablet  Commonly known as:  LOPRESSOR  Take 0.5 tablets (12.5 mg total) by mouth 2 (two) times daily.     multivitamin with minerals Tabs tablet  Take 1 tablet by mouth daily. Diabetes daily pak from [employer]     oxyCODONE 5 MG immediate release tablet  Commonly known as:  Oxy IR/ROXICODONE  Take 1-2 tablets (5-10 mg total) by mouth every 4 (four) hours as needed for severe pain.       Discharge Instructions:   1. Please obtain vital signs at least one time daily 2. Please weigh the patient daily. If he or she continues to gain weight or develops lower extremity edema, contact the office at [phone]. 3. Ambulate patient at least three times daily and please use sternal precautions. 4. The patient is to refrain from driving, heavy lifting or strenuous activity.   5. May shower daily and clean incisions with soap and water.   6. May continue carb modified diet.   Follow Up:  Discharge Instructions    Amb Referral to Cardiac Rehabilitation  Complete by:  As directed            Follow-up Information     Follow up with Lesleigh NoeSMITH III,HENRY W, MD.   Specialty:  Cardiology   Why:  Office will contact you with an appointment   Contact information:   [address] [place] [address] [phone]       Follow up with Purcell NailsWEN,CLARENCE H, MD.   Specialty:  Cardiothoracic Surgery   Why:  Office will contact you with an appointment   Contact information:   [address] [place] [address] [phone]       Follow up with Romero BellingELLISON, SEAN, MD In 1 week.   Specialty:  Endocrinology   Why:  Office will schedule follow up   Contact information:   [address] [place] [address] [phone]      The patient has been discharged on:   1.Beta Blocker:  Yes [ y ]                              No   [   ]                              If No, reason:  2.Ace Inhibitor/ARB: Yes Cove.Etienne[y   ]                                     No  [    ]                                     If No, reason:  3.Statin:   Yes [ y  ]                  No  [   ]                  If No, reason:  4.Ecasa:  Yes  Cove.Etienne[y   ]                  No   [   ]                  If No, reason:   Chelcie Estorga E 07/11/2014, 5:20 PM

## 2014-07-11 NOTE — Clinical Social Work Psychosocial (Signed)
Clinical Social Work Department BRIEF PSYCHOSOCIAL ASSESSMENT 07/11/2014  Patient:  Reginald Gutierrez, Reginald Gutierrez     Account Number:  [id number]     Admit date:  07/03/2014  Clinical Social Worker:  Elouise Munroe  Date/Time:  07/11/2014 05:48 PM  Referred by:  Physician  Date Referred:  07/11/2014 Referred for  SNF Placement   Other Referral:   Interview type:  Patient Other interview type:   Reginald Gutierrez mother  who he lives with    PSYCHOSOCIAL DATA Living Status:  PARENTS Admitted from facility:   Level of care:   Primary support name:  Reginald Gutierrez Primary support relationship to patient:  PARENT Degree of support available:   Patient lives with his mother who helps take care of him.    CURRENT CONCERNS  Other Concerns:    SOCIAL WORK ASSESSMENT / PLAN Patient is a 48 year old male who lives with his mother. Patient is very talkative and alert and oriented. Patient's mother is at his bedside.  Patient is his own guardian but gets support from his mother.  Patient and his mother in agreement to go to a SNF for rehab and diabetes control.  Patient will discharge to a SNF once dc orders have been received and bed placement has been found.   Assessment/plan status:   Other assessment/ plan:   Information/referral to community resources:    PATIENT'S/FAMILY'S RESPONSE TO PLAN OF CARE: Patient and his mother in agreement to go to a SNF for rehab and diabetes control.   Reginald Gutierrez. Reginald Gutierrez, MSW, Theresia Majors [phone] 07/11/2014 5:55 PM

## 2014-07-11 NOTE — Progress Notes (Signed)
Pt would like to see a psychiatrist; notified PA; MD and PA are currently in surgery will discuss after surgery.  Hermina Barters, RN

## 2014-07-11 NOTE — Progress Notes (Addendum)
       [address]       [place] [address]             [phone]          5 Days Post-Op Procedure(s) (LRB): CORONARY ARTERY BYPASS GRAFTING (CABG) x4 using left internal mammary artery and bilateral thigh greater saphenous veins (N/A) INTRAOPERATIVE TRANSESOPHAGEAL ECHOCARDIOGRAM (N/A) PATENT FORAMEN OVALE CLOSURE (N/A)  Subjective: Feels better today, less sore and appetite improving.  "I'm getting out of here today one way or another."  Objective: Vital signs in last 24 hours: Patient Vitals for the past 24 hrs:  BP Temp Temp src Pulse Resp SpO2 Weight  07/11/14 0420 129/73 mmHg 98.6 F (37 C) Oral 81 18 90 % 187 lb 3.2 oz (84.913 kg)  07/10/14 1956 (!) 131/56 mmHg 97.9 F (36.6 C) Oral 92 18 92 % -  07/10/14 1426 (!) 120/42 mmHg 98 F (36.7 C) Oral 74 20 91 % -  07/10/14 1115 133/79 mmHg 98 F (36.7 C) Oral 77 - 93 % -  07/10/14 1000 (!) 122/50 mmHg - - 88 17 97 % -  07/10/14 0900 - - - 98 (!) 22 98 % -   Current Weight  07/11/14 187 lb 3.2 oz (84.913 kg)  BASELINE WEIGHT:80.5 kg   Intake/Output from previous day: 11/10 0701 - 11/11 0700 In: 240 [P.O.:240] Out: 325 [Urine:325]  CBGs 222-109-72   PHYSICAL EXAM:  Heart: RRR Lungs: Clear Wound: Clean and dry Extremities: Mild LE edema    Lab Results: CBC: Recent Labs  07/09/14 0219 07/10/14 0239  WBC 11.1* 7.8  HGB 8.1* 8.1*  HCT 24.3* 24.0*  PLT 173 248   BMET:  Recent Labs  07/09/14 0219 07/10/14 0239  NA 136* 137  K 4.4 4.2  CL 98 99  CO2 29 27  GLUCOSE 89 212*  BUN 24* 24*  CREATININE 1.12 0.88  CALCIUM 8.0* 8.3*    PT/INR: No results for input(s): LABPROT, INR in the last 72 hours.    Assessment/Plan: S/P Procedure(s) (LRB): CORONARY ARTERY BYPASS GRAFTING (CABG) x4 using left internal mammary artery and bilateral thigh greater saphenous veins (N/A) INTRAOPERATIVE TRANSESOPHAGEAL ECHOCARDIOGRAM (N/A) PATENT FORAMEN OVALE CLOSURE (N/A)  CV- SR, BPs  stable and trending up.  Will resume low dose ACE-I and continue beta blocker.  Vol overload- continue diuresis.  T1DM- sugars generally stable. Pt to continue current regimen and follow up with Dr. Everardo All as OP.  Expected postop blood loss anemia- H/H generally stable.  Disp- to SNF when bed available.   LOS: 8 days    COLLINS,GINA H 07/11/2014  I have seen and examined the patient and agree with the assessment and plan as outlined.  Ready for d/c to SNF.  OWEN,CLARENCE H 07/11/2014 9:34 AM

## 2014-07-11 NOTE — Discharge Instructions (Signed)
Endoscopic Saphenous Vein Harvesting °Care After °Refer to this sheet in the next few weeks. These instructions provide you with information on caring for yourself after your procedure. Your health care provider may also give you more specific instructions. Your treatment has been planned according to current medical practices, but problems sometimes occur. Call your health care provider if you have any problems or questions after your procedure. °HOME CARE INSTRUCTIONS °Medicine °· Take whatever pain medicine your surgeon prescribes. Follow the directions carefully. Do not take over-the-counter pain medicine unless your surgeon says it is okay. Some pain medicine can cause bleeding problems for several weeks after surgery. °· Follow your surgeon's instructions about driving. You will probably not be permitted to drive after heart surgery. °· Take any medicines your surgeon prescribes. Any medicines you took before your heart surgery should be checked with your health care provider before you start taking them again. °Wound care °· If your surgeon has prescribed an elastic bandage or stocking, ask how long you should wear it. °· Check the area around your surgical cuts (incisions) whenever your bandages (dressings) are changed. Look for any redness or swelling. °· You will need to return to have the stitches (sutures) or staples taken out. Ask your surgeon when to do that. °· Ask your surgeon when you can shower or bathe. °Activity °· Try to keep your legs raised when you are sitting. °· Do any exercises your health care providers have given you. These may include deep breathing exercises, coughing, walking, or other exercises. °SEEK MEDICAL CARE IF: °· You have any questions about your medicines. °· You have more leg pain, especially if your pain medicine stops working. °· New or growing bruises develop on your leg. °· Your leg swells, feels tight, or becomes red. °· You have numbness in your leg. °SEEK IMMEDIATE  MEDICAL CARE IF: °· Your pain gets much worse. °· Blood or fluid leaks from any of the incisions. °· Your incisions become warm, swollen, or red. °· You have chest pain. °· You have trouble breathing. °· You have a fever. °· You have more pain near your leg incision. °MAKE SURE YOU: °· Understand these instructions. °· Will watch your condition. °· Will get help right away if you are not doing well or get worse. °Document Released: 04/29/2011 Document Revised: 08/22/2013 Document Reviewed: 04/29/2011 °ExitCare® Patient Information ©2015 [employer]. This information is not intended to replace advice given to you by your health care provider. Make sure you discuss any questions you have with your health care provider. °Coronary Artery Bypass Grafting, Care After °These instructions give you information on caring for yourself after your procedure. Your doctor may also give you more specific instructions. Call your doctor if you have any problems or questions after your procedure.  °HOME CARE °· Only take medicine as told by your doctor. Take medicines exactly as told. Do not stop taking medicines or start any new medicines without talking to your doctor first. °· Take your pulse as told by your doctor. °· Do deep breathing as told by your doctor. Use your breathing device (incentive spirometer), if given, to practice deep breathing several times a day. Support your chest with a pillow or your arms when you take deep breaths or cough. °· Keep the area clean, dry, and protected where the surgery cuts (incisions) were made. Remove bandages (dressings) only as told by your doctor. If strips were applied to surgical area, do not take them off. They fall off   on their own. °· Check the surgery area daily for puffiness (swelling), redness, or leaking fluid. °· If surgery cuts were made in your legs: °· Avoid crossing your legs. °· Avoid sitting for long periods of time. Change positions every 30 minutes. °· Raise your legs  when you are sitting. Place them on pillows. °· Wear stockings that help keep blood clots from forming in your legs (compression stockings). °· Only take sponge baths until your doctor says it is okay to take showers. Pat the surgery area dry. Do not rub the surgery area with a washcloth or towel. Do not bathe, swim, or use a hot tub until your doctor says it is okay. °· Eat foods that are high in fiber. These include raw fruits and vegetables, whole grains, beans, and nuts. Choose lean meats. Avoid canned, processed, and fried foods. °· Drink enough fluids to keep your pee (urine) clear or pale yellow. °· Weigh yourself every day. °· Rest and limit activity as told by your doctor. You may be told to: °· Stop any activity if you have chest pain, shortness of breath, changes in heartbeat, or dizziness. Get help right away if this happens. °· Move around often for short amounts of time or take short walks as told by your doctor. Gradually become more active. You may need help to strengthen your muscles and build endurance. °· Avoid lifting, pushing, or pulling anything heavier than 10 pounds (4.5 kg) for at least 6 weeks after surgery. °· Do not drive until your doctor says it is okay. °· Ask your doctor when you can go back to work. °· Ask your doctor when you can begin sexual activity again. °· Follow up with your doctor as told. °GET HELP IF: °· You have puffiness, redness, more pain, or fluid draining from the incision site. °· You have a fever. °· You have puffiness in your ankles or legs. °· You have pain in your legs. °· You gain 2 or more pounds (0.9 kg) a day. °· You feel sick to your stomach (nauseous) or throw up (vomit). °· You have watery poop (diarrhea). °GET HELP RIGHT AWAY IF: °· You have chest pain that goes to your jaw or arms. °· You have shortness of breath. °· You have a fast or irregular heartbeat. °· You notice a "clicking" in your breastbone when you move. °· You have numbness or weakness in  your arms or legs. °· You feel dizzy or light-headed. °MAKE SURE YOU: °· Understand these instructions. °· Will watch your condition. °· Will get help right away if you are not doing well or get worse. °Document Released: 08/22/2013 Document Reviewed: 08/22/2013 °ExitCare® Patient Information ©2015 [employer]. This information is not intended to replace advice given to you by your health care provider. Make sure you discuss any questions you have with your health care provider. ° °

## 2014-07-11 NOTE — Clinical Social Work Placement (Signed)
Clinical Social Work Department CLINICAL SOCIAL WORK PLACEMENT NOTE 07/11/2014  Patient:  DUMONT, CHARANIA  Account Number:  [id number] Admit date:  07/03/2014  Clinical Social Worker:  Sandrea Boer, LCSWA  Date/time:  07/11/2014 05:55 PM  Clinical Social Work is seeking post-discharge placement for this patient at the following level of care:   SKILLED NURSING   (*CSW will update this form in Epic as items are completed)   07/11/2014  Patient/family provided with [facility] Department of Clinical Social Work's list of facilities offering this level of care within the geographic area requested by the patient (or if unable, by the patient's family).  07/11/2014  Patient/family informed of their freedom to choose among providers that offer the needed level of care, that participate in Medicare, Medicaid or managed care program needed by the patient, have an available bed and are willing to accept the patient.  07/11/2014  Patient/family informed of [facility]' ownership interest in [facility], as well as of the fact that they are under no obligation to receive care at this facility.  PASARR submitted to EDS on 07/11/2014 PASARR number received on 07/11/2014  FL2 transmitted to all facilities in geographic area requested by pt/family on  07/11/2014 FL2 transmitted to all facilities within larger geographic area on 07/11/2014  Patient informed that his/her managed care company has contracts with or will negotiate with  certain facilities, including the following:     Patient/family informed of bed offers received:  07/11/2014 Patient chooses bed at [facility] Physician recommends and patient chooses bed at    [facility] on  07/11/2014 Patient to be transferred to facility by EMS Patient and family notified of transfer on  Name of family member notified:    The following physician request were entered in  Epic:   Additional Comments:  Tedra Coppernoll R. Laketra Bowdish, MSW, Theresia Majors [phone] 07/11/2014 5:57 PM

## 2014-07-11 NOTE — Progress Notes (Signed)
[facility] arranged to transport pt to [facility].  D/C summary faxed to facility via [facility].  Pt and mother informed and agreeable to tx.

## 2014-07-11 NOTE — Progress Notes (Signed)
DC CT sutures per order and protocol; cleansed chest incision and bilateral leg incisions; placed 2x2 gauze on right leg incision.  Hermina Barters, RN

## 2014-07-11 NOTE — Progress Notes (Signed)
Pt walking to desk independently. Anxious about finding and SNF place today. Ed completed. Emphasized smoking cessation. Interested in [facility] and will send referral to [place]. Pt very distracted and sleepy during education. Did not discuss diet, left diet sheets.  [id number] Reginald Gutierrez CES, ACSM 3:03 PM 07/11/2014

## 2014-07-17 ENCOUNTER — Encounter: Payer: Self-pay | Admitting: Endocrinology

## 2014-07-17 ENCOUNTER — Ambulatory Visit (INDEPENDENT_AMBULATORY_CARE_PROVIDER_SITE_OTHER): Payer: Medicare Other | Admitting: Endocrinology

## 2014-07-17 VITALS — BP 124/62 | HR 80 | Temp 98.6°F | Wt 180.0 lb

## 2014-07-17 DIAGNOSIS — E1021 Type 1 diabetes mellitus with diabetic nephropathy: Secondary | ICD-10-CM

## 2014-07-17 DIAGNOSIS — I2510 Atherosclerotic heart disease of native coronary artery without angina pectoris: Secondary | ICD-10-CM

## 2014-07-17 NOTE — Patient Instructions (Signed)
check your blood sugar 6 times a day--before the 3 meals, and at bedtime.  also check if you have symptoms of your blood sugar being too high or too low.  please keep a record of the readings and bring it to your next appointment here.  please call us sooner if your blood sugar goes below 70, or if it stays over 200. Please come back for a follow-up appointment in 1 month.   Please increase the lantus to 12 units at bedtime (not prn).  Please continue the same PRN novolog.

## 2014-07-17 NOTE — Progress Notes (Signed)
Subjective:    Patient ID: Reginald Gutierrez, male    DOB: June 19, 1966, 48 y.o.   MRN: [id number]  HPI Pt returns for f/u of diabetes mellitus: DM type: 1 Dx'ed: 1969. Complications: renal failure, retinopathy, PAD, and CAD.   Therapy: insulin since  DKA: never Severe hypoglycemia: recurrent, most recently in 2015 Pancreatitis: never Other: due to noncompliance, he has required a simple qd insulin regimen; in late 2013, he had a episode of severe hypoglycemia.   Interval history: He has CABG week before last.  He is now living at [facility], where he anticipates being for approx 1 more week.  He is feeling steadily better, and is eating well.  Past Medical History  Diagnosis Date  . Diabetes mellitus type 1   . Hyperlipidemia   . Depression   . Umbilical hernia   . Hypertension   . Glaucoma   . CHF (congestive heart failure)   . Hypothyroidism   . Hypoglycemia, unspecified   . Disorder of bone and cartilage, unspecified   . Acute posthemorrhagic anemia   . Anxiety state, unspecified   . Chronic pain syndrome   . Acute respiratory failure   . Dysphagia, oral phase   . Stress fracture of tibia or fibula   . Syncope and collapse   . Coronary artery disease 07/19/2012  . Ischemic cardiomyopathy   . Narcotic dependency, continuous   . Heart attack 12/16/11  . NSTEMI (non-ST elevated myocardial infarction) 12/19/2011; 07/03/2014    ; Hattie Perch 07/03/2014  . 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 Appendectomy      open  . Tibia im nail insertion  12/18/2011    Procedure: INTRAMEDULLARY (IM) NAIL TIBIAL;  Surgeon: Eugenia Mcalpine, MD;  Location: [facility] ORS;  Service: Orthopedics;  Laterality: Bilateral;  . Insertion of dialysis catheter  12/31/2011    Procedure: INSERTION OF DIALYSIS CATHETER;  Surgeon: Chuck Hint, MD;  Location: [facility] OR;  Service:  Vascular;  Laterality: N/A;; "while in ICU; not permanent  . Tonsillectomy  1980  . Fracture surgery  2012    wrist  . Fracture surgery  2013    bilateral tibia  . Cardiac catheterization  07/2012; 01/2014  . Coronary artery bypass graft N/A 07/06/2014    Procedure: CORONARY ARTERY BYPASS GRAFTING (CABG) x4 using left internal mammary artery and bilateral thigh greater saphenous veins;  Surgeon: Purcell Nails, MD;  Location: [facility] OR;  Service: Open Heart Surgery;  Laterality: N/A;  . Intraoperative transesophageal echocardiogram N/A 07/06/2014    Procedure: INTRAOPERATIVE TRANSESOPHAGEAL ECHOCARDIOGRAM;  Surgeon: Purcell Nails, MD;  Location: [facility] OR;  Service: Open Heart Surgery;  Laterality: N/A;  . Patent foramen ovale closure N/A 07/06/2014    Procedure: PATENT FORAMEN OVALE CLOSURE;  Surgeon: Purcell Nails, MD;  Location: [facility] OR;  Service: Open Heart Surgery;  Laterality: N/A;    History   Social History  . Marital Status: Single    Spouse Name: N/A    Number of Children: 0  . Years of Education: 16   Occupational History  . Sales- disabled    Social History Main Topics  . Smoking status: Current Some Day Smoker -- 1.00 packs/day    Types: Cigarettes  . Smokeless tobacco: Never Used  . Alcohol Use: No  .  Drug Use: No     Comment: Past Cocaine abuse-2002-2003  . Sexual Activity: No   Other Topics Concern  . Not on file   Social History Narrative    Current Outpatient Prescriptions on File Prior to Visit  Medication Sig Dispense Refill  . ACCU-CHEK AVIVA PLUS test strip TEST 6 TIMES A DAY AS DIRECTED. 150 each 12  . ARIPiprazole (ABILIFY) 5 MG tablet Take 2.5 mg by mouth 2 (two) times daily.    Marland Kitchen. aspirin EC 81 MG tablet Take 81 mg by mouth daily.    Marland Kitchen. atorvastatin (LIPITOR) 40 MG tablet take 1 tablet by mouth once daily 30 tablet 3  . DULoxetine 40 MG CPEP Take 40 mg by mouth daily. 30 capsule 0  . fentaNYL (DURAGESIC - DOSED MCG/HR) 50 MCG/HR Place 1 patch (50 mcg  total) onto the skin every 3 (three) days. 5 patch 0  . ferrous sulfate 325 (65 FE) MG tablet Take 325 mg by mouth daily with breakfast.    . glucagon (GLUCAGON EMERGENCY) 1 MG injection Inject 1 mg into the muscle once as needed (for low blood sugar). 1 each 2  . glucose blood test strip TEST BLOOD SUGAR 6 TIMES A DAY AS INSTRUCTED BY DR Zahraa Bhargava 200 each 10  . insulin glargine (LANTUS) 100 UNIT/ML injection Inject 12 Units into the skin at bedtime.     . insulin regular (NOVOLIN R,HUMULIN R) 100 units/mL injection Inject 2-8 Units into the skin See admin instructions. Sliding scale.   Give 2-8 units four times a day    . INSULIN SYRINGE .5CC/29G 29G X 1/2" 0.5 ML MISC 1 Syringe by Does not apply route daily. 30 each 11  . Insulin Syringe-Needle U-100 (B-D INS SYRINGE 0.5CC/30GX1/2") 30G X 1/2" 0.5 ML MISC Use 3-4 times per day 300 each 2  . levothyroxine (SYNTHROID, LEVOTHROID) 125 MCG tablet Take 1 tablet (125 mcg total) by mouth daily before breakfast. 30 tablet 11  . lisinopril (PRINIVIL,ZESTRIL) 5 MG tablet Take 1 tablet (5 mg total) by mouth daily. 30 tablet 1  . LORazepam (ATIVAN) 0.5 MG tablet Take 1 tablet (0.5 mg total) by mouth every 6 (six) hours as needed for anxiety. 30 tablet 0  . metoprolol tartrate (LOPRESSOR) 12.5 mg TABS tablet Take 0.5 tablets (12.5 mg total) by mouth 2 (two) times daily. 60 each 1  . Multiple Vitamin (MULTIVITAMIN WITH MINERALS) TABS tablet Take 1 tablet by mouth daily. Diabetes daily pak from [employer]    . oxyCODONE (OXY IR/ROXICODONE) 5 MG immediate release tablet Take 1-2 tablets (5-10 mg total) by mouth every 4 (four) hours as needed for severe pain. 50 tablet 0   No current facility-administered medications on file prior to visit.    No Known Allergies  Family History  Problem Relation Age of Onset  . Heart disease Father     BP 124/62 mmHg  Pulse 80  Temp(Src) 98.6 F (37 C) (Oral)  Wt 180 lb (81.647 kg)  SpO2 90%  Review of Systems He  denies hypoglycemia and weight change.     Objective:   Physical Exam VITAL SIGNS:  See vs page GENERAL: no distress Chest wall: wound is healing well. Pulses: dorsalis pedis intact bilat.   Feet: no deformity.  2+ bilat leg edema. Skin:  no ulcer on the feet.  normal color and temp. Neuro: sensation is intact to touch on the feet.     (labs: i have reviewed cbg record from [place]).  i have reviewed the following old records:  hosp d/c summary: pt had an uneventful clinical postop course.       Assessment & Plan:  DM: moderate exacerbation.     Patient is advised the following: Patient Instructions  check your blood sugar 6 times a day--before the 3 meals, and at bedtime.  also check if you have symptoms of your blood sugar being too high or too low.  please keep a record of the readings and bring it to your next appointment here.  please call us sooner if your blood sugar goes below 70, or if it stays over 200. Please come back for a follow-up appointment in 1 month.   Please increase the lantus to 12 units at bedtime (not prn).  Please continue the same PRN novolog.

## 2014-08-03 ENCOUNTER — Other Ambulatory Visit: Payer: Self-pay

## 2014-08-03 DIAGNOSIS — I2510 Atherosclerotic heart disease of native coronary artery without angina pectoris: Secondary | ICD-10-CM

## 2014-08-06 ENCOUNTER — Ambulatory Visit
Admission: RE | Admit: 2014-08-06 | Discharge: 2014-08-06 | Disposition: A | Discharge status: Home / Self Care | Payer: Medicare Other | Source: Ambulatory Visit | Attending: Thoracic Surgery (Cardiothoracic Vascular Surgery) | Admitting: Thoracic Surgery (Cardiothoracic Vascular Surgery)

## 2014-08-06 ENCOUNTER — Ambulatory Visit (INDEPENDENT_AMBULATORY_CARE_PROVIDER_SITE_OTHER): Payer: Self-pay | Admitting: Thoracic Surgery (Cardiothoracic Vascular Surgery)

## 2014-08-06 ENCOUNTER — Encounter: Payer: Self-pay | Admitting: Thoracic Surgery (Cardiothoracic Vascular Surgery)

## 2014-08-06 VITALS — BP 104/69 | HR 80 | Resp 18 | Ht 67.0 in | Wt 171.5 lb

## 2014-08-06 DIAGNOSIS — Z951 Presence of aortocoronary bypass graft: Secondary | ICD-10-CM

## 2014-08-06 DIAGNOSIS — I2510 Atherosclerotic heart disease of native coronary artery without angina pectoris: Secondary | ICD-10-CM

## 2014-08-06 MED ORDER — OXYCODONE HCL 5 MG PO TABS: 5.0000 mg | ORAL_TABLET | ORAL | Status: DC | PRN

## 2014-08-06 MED ORDER — POTASSIUM CHLORIDE CRYS ER 10 MEQ PO TBCR: 10.0000 meq | EXTENDED_RELEASE_TABLET | Freq: Every day | ORAL | Status: DC

## 2014-08-06 MED ORDER — FUROSEMIDE 20 MG PO TABS: 20.0000 mg | ORAL_TABLET | Freq: Every day | ORAL | Status: DC

## 2014-08-06 NOTE — Progress Notes (Signed)
[address]       Reginald Gutierrez [id number]             [id number]     CARDIOTHORACIC SURGERY OFFICE NOTE  Referring Provider is [name], MD PCP is No PCP Per Patient   HPI:  Patient returns for routine follow-up status post coronary artery bypass grafting 4 and closure of patent foramen ovale on 07/06/2014 for severe three-vessel coronary artery disease with moderate left ventricular systolic dysfunction following acute non-ST segment elevation myocardial infarction.  The patient's postoperative recovery was uncomplicated and he was discharged to a local skilled nursing facility for rehabilitation on the fifth postoperative day. The patient was seen in follow-up by Dr. Everardo All shortly after hospital discharge for follow-up related to long-standing difficult to control type I insulin-dependent diabetes mellitus.  The patient returns for routine follow-up to the office today. He has not yet been seen in follow-up by Dr. Katrinka Blazing. The patient is accompanied to the office today by his mother. He is doing remarkably well. He has mild residual soreness in his chest. He has had some lower extremity swelling. He is participating in cardiac rehabilitation program. He has no shortness of breath. Overall he feels quite well. He states that his blood sugars have been under much better control. He admits that he has already resumed smoking cigars.   Current Outpatient Prescriptions  Medication Sig Dispense Refill  . ACCU-CHEK AVIVA PLUS test strip TEST 6 TIMES A DAY AS DIRECTED. 150 each 12  . ARIPiprazole (ABILIFY) 5 MG tablet Take 2.5 mg by mouth 2 (two) times daily.    Marland Kitchen aspirin EC 81 MG tablet Take 81 mg by mouth daily.    Marland Kitchen atorvastatin (LIPITOR) 40 MG tablet take 1 tablet by mouth once daily 30 tablet 3  . DULoxetine 40 MG CPEP Take 40 mg by mouth daily. 30 capsule 0  . fentaNYL (DURAGESIC - DOSED MCG/HR) 50 MCG/HR Place 1 patch (50 mcg total) onto the skin every 3  (three) days. 5 patch 0  . ferrous sulfate 325 (65 FE) MG tablet Take 325 mg by mouth daily with breakfast.    . glucagon (GLUCAGON EMERGENCY) 1 MG injection Inject 1 mg into the muscle once as needed (for low blood sugar). 1 each 2  . glucose blood test strip TEST BLOOD SUGAR 6 TIMES A DAY AS INSTRUCTED BY DR ELLISON 200 each 10  . insulin glargine (LANTUS) 100 UNIT/ML injection Inject 12 Units into the skin at bedtime.     . insulin regular (NOVOLIN R,HUMULIN R) 100 units/mL injection Inject 2-8 Units into the skin See admin instructions. Sliding scale.   Give 2-8 units four times a day    . INSULIN SYRINGE .5CC/29G 29G X 1/2" 0.5 ML MISC 1 Syringe by Does not apply route daily. 30 each 11  . Insulin Syringe-Needle U-100 (B-D INS SYRINGE 0.5CC/30GX1/2") 30G X 1/2" 0.5 ML MISC Use 3-4 times per day 300 each 2  . levothyroxine (SYNTHROID, LEVOTHROID) 125 MCG tablet Take 1 tablet (125 mcg total) by mouth daily before breakfast. 30 tablet 11  . lisinopril (PRINIVIL,ZESTRIL) 5 MG tablet Take 1 tablet (5 mg total) by mouth daily. 30 tablet 1  . LORazepam (ATIVAN) 0.5 MG tablet Take 1 tablet (0.5 mg total) by mouth every 6 (six) hours as needed for anxiety. 30 tablet 0  . metoprolol tartrate (LOPRESSOR) 12.5 mg TABS tablet Take 0.5 tablets (12.5 mg total) by mouth  2 (two) times daily. 60 each 1  . Multiple Vitamin (MULTIVITAMIN WITH MINERALS) TABS tablet Take 1 tablet by mouth daily. Diabetes daily pak from [employer]    . oxyCODONE (OXY IR/ROXICODONE) 5 MG immediate release tablet Take 1-2 tablets (5-10 mg total) by mouth every 4 (four) hours as needed for severe pain. 50 tablet 0   No current facility-administered medications for this visit.      Physical Exam:   BP 104/69 mmHg  Pulse 80  Resp 18  Ht 5\' 7"  (1.702 m)  Wt 171 lb 8 oz (77.792 kg)  BMI 26.85 kg/m2  SpO2 94%  General:  Well-appearing  Chest:   Clear  CV:   Regular rate and rhythm without murmur  Incisions:  Clean and dry and  healing nicely, sternum is stable  Abdomen:  Soft and nontender  Extremities:  Warm and well perfused with mild bilateral lower extremity edema  Diagnostic Tests:  CHEST 2 VIEW  COMPARISON: None.  FINDINGS: Mediastinum hilar structures normal. Prior CABG. Cardiomegaly with normal pulmonary vascularity. Interim clearing of pulmonary infiltrates. Persistent small bilateral pleural effusions. These findings suggest improving congestive heart failure. No pneumothorax. No acute osseous abnormality .  IMPRESSION: 1. Findings consistent with improving congestive heart failure. Mild residual small pleural effusions.  2. Prior CABG .   Electronically Signed  By: Maisie Fushomas Register  On: 08/06/2014 14:08   Impression:  Patient is doing well proximally one month status post coronary artery bypass grafting 4. He does appear to have mild fluid overload with bilateral lower extremity edema.  Plan:  I've given the patient a prescription for Lasix 20 mg daily with potassium chloride 10 mEq daily.  I have encouraged him to continue to gradually increase his physical activity as tolerated but I have cautioned him to refrain from any heavy lifting or strenuous use of his arms or shoulders for at least another 2-3 months. I've encouraged him to continue to participate in outpatient cardiac rehabilitation program. We have discussed how important glycemic control will remain for his long-term prognosis. I have also strongly counseled the patient regarding the need to find a way to quit smoking.  All of his questions been addressed. The patient will continue to follow-up with Dr. Katrinka BlazingSmith and Dr. Everardo AllEllison.  We will plan to see him back next fall proximally one year after his original surgery. He will call and return sooner as needed.    Salvatore Decentlarence H. Cornelius Moraswen, MD 08/06/2014 3:01 PM

## 2014-08-06 NOTE — Patient Instructions (Addendum)
Patient should begin taking lasix and potassium as directed.  The patient should continue to avoid any heavy lifting or strenuous use of arms or shoulders for at least a total of three months from the time of surgery.  The patient is reminded to make every effort to keep their diabetes under very tight control.  They should follow up closely with their primary care physician or endocrinologist and strive to keep their hemoglobin A1c levels as low as possible, preferably near or below 6.0  The patient should make every effort to stop smoking immediately and permanently.  The patient is encouraged to participate in the outpatient cardiac rehab program .

## 2014-08-08 ENCOUNTER — Encounter: Payer: Self-pay | Admitting: Physician Assistant

## 2014-08-09 ENCOUNTER — Encounter (HOSPITAL_COMMUNITY): Payer: Self-pay | Admitting: Cardiology

## 2014-08-13 ENCOUNTER — Ambulatory Visit: Payer: Self-pay | Admitting: Thoracic Surgery (Cardiothoracic Vascular Surgery)

## 2014-08-16 ENCOUNTER — Ambulatory Visit: Payer: Self-pay | Admitting: Endocrinology

## 2014-08-21 ENCOUNTER — Telehealth (HOSPITAL_COMMUNITY): Payer: Self-pay | Admitting: Cardiac Rehabilitation

## 2014-08-21 NOTE — Telephone Encounter (Signed)
pc to pt to enroll in cardiac rehab. Returned pt mother's call who states pt is not feeling well, has not finished Home health PT and not ready to begin. Phone call to pt to assess readiness.  Pt states he is feeling well however is not able to talk and hangs up phone.

## 2014-09-05 ENCOUNTER — Ambulatory Visit: Payer: Self-pay | Admitting: Endocrinology

## 2014-09-14 ENCOUNTER — Telehealth: Payer: Self-pay | Admitting: Interventional Cardiology

## 2014-09-14 DIAGNOSIS — I10 Essential (primary) hypertension: Secondary | ICD-10-CM

## 2014-09-14 NOTE — Telephone Encounter (Signed)
Patient st he has painful bilateral LE edema and shortness of breath. When asked if he is compliant with his medications, he st he has not taken his fluid pill for over two weeks but is not sure why he stopped. Spoke with DOD, Dr. Eden Emms, who recommended to restart Lasix 20 mg daily and to take 40 mg just for today. Repeat BMET ordered for next week, but patient to call and schedule when he knows he can get a ride to the office. Informed patient he should have another refill at his [facility], and to call us if he has any problems getting his prescription. Patient agrees with treatment plan.

## 2014-09-14 NOTE — Telephone Encounter (Signed)
Pt c/o swelling: STAT is pt has developed SOB within 24 hours  1. How long have you been experiencing swelling? 1 week  2. Where is the swelling located? Feet  3.  Are you currently taking a "fluid pill"? No  4.  Are you currently SOB? Yes, within the last 24 hours  5.  Have you traveled recently? No

## 2014-09-24 ENCOUNTER — Other Ambulatory Visit: Payer: Self-pay

## 2014-09-24 ENCOUNTER — Other Ambulatory Visit: Payer: Self-pay | Admitting: Internal Medicine

## 2014-09-24 MED ORDER — LEVOTHYROXINE SODIUM 125 MCG PO TABS: 125.0000 ug | ORAL_TABLET | Freq: Every day | ORAL | Status: AC

## 2014-09-25 ENCOUNTER — Ambulatory Visit: Payer: Self-pay | Admitting: Interventional Cardiology

## 2014-09-26 ENCOUNTER — Encounter: Payer: Self-pay | Admitting: Interventional Cardiology

## 2014-09-26 ENCOUNTER — Ambulatory Visit (INDEPENDENT_AMBULATORY_CARE_PROVIDER_SITE_OTHER): Payer: Medicare Other | Admitting: Interventional Cardiology

## 2014-09-26 VITALS — BP 114/72 | HR 103 | Ht 67.0 in | Wt 179.1 lb

## 2014-09-26 DIAGNOSIS — I5023 Acute on chronic systolic (congestive) heart failure: Secondary | ICD-10-CM

## 2014-09-26 DIAGNOSIS — F17200 Nicotine dependence, unspecified, uncomplicated: Secondary | ICD-10-CM

## 2014-09-26 DIAGNOSIS — Z951 Presence of aortocoronary bypass graft: Secondary | ICD-10-CM

## 2014-09-26 DIAGNOSIS — I10 Essential (primary) hypertension: Secondary | ICD-10-CM

## 2014-09-26 DIAGNOSIS — E785 Hyperlipidemia, unspecified: Secondary | ICD-10-CM

## 2014-09-26 DIAGNOSIS — I255 Ischemic cardiomyopathy: Secondary | ICD-10-CM

## 2014-09-26 DIAGNOSIS — Z720 Tobacco use: Secondary | ICD-10-CM

## 2014-09-26 NOTE — Progress Notes (Signed)
Patient ID: Reginald Gutierrez, male   DOB: Oct 02, 1965, 49 y.o.  MRN [id number]     Cardiology Office Note   Date:  09/26/2014   ID:  Reginald Gutierrez, DOB 16-Aug-1966, MRN [id number]  PCP:  No PCP Per Patient  Cardiologist:   [name], MD   Ischemic myopathy/systolic heart failure    History of Present Illness: Reginald Gutierrez is a 49 y.o. male who presents for follow-up of ischemic heart disease. He had coronary bypass surgery in November. He has severe ischemic cardiomyopathy and severe diffuse coronary disease. LVEF prior to grafting was 15%. Since surgery he has significantly improved. He denies dyspnea. He denies angina. There is no lower extremity edema. He has been sedentary. Still smokes up to 5 cigarettes per day. He also got back on cocaine use. We discussed this. He has a good psychiatrist now according to his mom.    Past Medical History  Diagnosis Date  . Diabetes mellitus type 1   . Hyperlipidemia   . Depression   . Umbilical hernia   . Hypertension   . Glaucoma   . CHF (congestive heart failure)   . Hypothyroidism   . Hypoglycemia, unspecified   . Disorder of bone and cartilage, unspecified   . Acute posthemorrhagic anemia   . Anxiety state, unspecified   . Chronic pain syndrome   . Acute respiratory failure   . Dysphagia, oral phase   . Stress fracture of tibia or fibula   . Syncope and collapse   . Coronary artery disease 07/19/2012  . Ischemic cardiomyopathy   . Narcotic dependency, continuous   . Heart attack 12/16/11  . NSTEMI (non-ST elevated myocardial infarction) 12/19/2011; 07/03/2014    ; Hattie Perch 07/03/2014  . 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 Appendectomy      open  . Tibia im nail insertion  12/18/2011    Procedure: INTRAMEDULLARY (IM) NAIL TIBIAL;  Surgeon: Eugenia Mcalpine, MD;  Location: [facility] ORS;  Service:  Orthopedics;  Laterality: Bilateral;  . Insertion of dialysis catheter  12/31/2011    Procedure: INSERTION OF DIALYSIS CATHETER;  Surgeon: Chuck Hint, MD;  Location: [facility] OR;  Service: Vascular;  Laterality: N/A;; "while in ICU; not permanent  . Tonsillectomy  1980  . Fracture surgery  2012    wrist  . Fracture surgery  2013    bilateral tibia  . Cardiac catheterization  07/2012; 01/2014  . Coronary artery bypass graft N/A 07/06/2014    Procedure: CORONARY ARTERY BYPASS GRAFTING (CABG) x4 using left internal mammary artery and bilateral thigh greater saphenous veins;  Surgeon: Purcell Nails, MD;  Location: [facility] OR;  Service: Open Heart Surgery;  Laterality: N/A;  . Intraoperative transesophageal echocardiogram N/A 07/06/2014    Procedure: INTRAOPERATIVE TRANSESOPHAGEAL ECHOCARDIOGRAM;  Surgeon: Purcell Nails, MD;  Location: [facility] OR;  Service: Open Heart Surgery;  Laterality: N/A;  . Patent foramen ovale closure N/A 07/06/2014    Procedure: PATENT FORAMEN OVALE CLOSURE;  Surgeon: Purcell Nails, MD;  Location: [facility] OR;  Service: Open Heart Surgery;  Laterality: N/A;  . Left heart catheterization with coronary angiogram N/A 07/22/2012    Procedure: LEFT HEART CATHETERIZATION WITH CORONARY ANGIOGRAM;  Surgeon: Peter M Swaziland, MD;  Location: [facility] CATH LAB;  Service: Cardiovascular;  Laterality:  N/A;  . Left heart catheterization with coronary angiogram N/A 02/13/2014    Procedure: LEFT HEART CATHETERIZATION WITH CORONARY ANGIOGRAM;  Surgeon: [name], MD;  Location: [facility] CATH LAB;  Service: Cardiovascular;  Laterality: N/A;  . Left heart catheterization with coronary angiogram N/A 07/04/2014    Procedure: LEFT HEART CATHETERIZATION WITH CORONARY ANGIOGRAM;  Surgeon: [name], MD;  Location: [facility] CATH LAB;  Service: Cardiovascular;  Laterality: N/A;     Current Outpatient Prescriptions  Medication Sig Dispense Refill  . ACCU-CHEK AVIVA PLUS test strip TEST 6 TIMES A DAY AS DIRECTED.  150 each 12  . ARIPiprazole (ABILIFY) 5 MG tablet Take 2.5 mg by mouth 2 (two) times daily.    Marland Kitchen aspirin EC 81 MG tablet Take 81 mg by mouth daily.    Marland Kitchen atorvastatin (LIPITOR) 40 MG tablet take 1 tablet by mouth once daily 30 tablet 3  . clonazePAM (KLONOPIN) 1 MG tablet Take 1 mg by mouth daily as needed. FOR ANXIETY AND SLEEP  0  . DULoxetine 40 MG CPEP Take 40 mg by mouth daily. 30 capsule 0  . ferrous sulfate 325 (65 FE) MG tablet Take 325 mg by mouth daily with breakfast.    . furosemide (LASIX) 20 MG tablet Take 1 tablet (20 mg total) by mouth daily. 30 tablet 1  . glucagon (GLUCAGON EMERGENCY) 1 MG injection Inject 1 mg into the muscle once as needed (for low blood sugar). 1 each 2  . glucose blood test strip TEST BLOOD SUGAR 6 TIMES A DAY AS INSTRUCTED BY DR ELLISON 200 each 10  . insulin glargine (LANTUS) 100 UNIT/ML injection Inject 12 Units into the skin at bedtime.     . insulin regular (NOVOLIN R,HUMULIN R) 100 units/mL injection Inject 2-8 Units into the skin See admin instructions. Sliding scale.   Give 2-8 units four times a day    . INSULIN SYRINGE .5CC/29G 29G X 1/2" 0.5 ML MISC 1 Syringe by Does not apply route daily. 30 each 11  . Insulin Syringe-Needle U-100 (B-D INS SYRINGE 0.5CC/30GX1/2") 30G X 1/2" 0.5 ML MISC Use 3-4 times per day 300 each 2  . levothyroxine (SYNTHROID, LEVOTHROID) 125 MCG tablet Take 1 tablet (125 mcg total) by mouth daily before breakfast. 30 tablet 2  . lisinopril (PRINIVIL,ZESTRIL) 40 MG tablet Take 40 mg by mouth daily. TAKES 1/2 TABLET EVERY DAY  0  . metoprolol tartrate (LOPRESSOR) 12.5 mg TABS tablet Take 0.5 tablets (12.5 mg total) by mouth 2 (two) times daily. 60 each 1  . Multiple Vitamin (MULTIVITAMIN WITH MINERALS) TABS tablet Take 1 tablet by mouth daily. Diabetes daily pak from [employer]    . potassium chloride (K-DUR,KLOR-CON) 10 MEQ tablet Take 1 tablet (10 mEq total) by mouth daily. 30 tablet 1   No current facility-administered  medications for this visit.    Allergies:   Review of patient's allergies indicates no known allergies.    Social History:  The patient  reports that he has been smoking Cigarettes.  He has been smoking about 1.00 pack per day. He has never used smokeless tobacco. He reports that he does not drink alcohol or use illicit drugs.   Family History:  The patient's family history includes Heart disease in his father.    ROS:  Please see the history of present illness.   Otherwise, review of systems are positive for a supraumbilical bulge/hernia that he wants to get fixed.   All other systems are reviewed  and negative.    PHYSICAL EXAM: VS:  BP 114/72 mmHg  Pulse 103  Ht 5\' 7"  (1.702 m)  Wt 179 lb 1.9 oz (81.248 kg)  BMI 28.05 kg/m2  SpO2 95% , BMI Body mass index is 28.05 kg/(m^2). GEN: Well nourished, well developed, in no acute distress HEENT: normal Neck: no JVD, carotid bruits, or masses Cardiac: RRR; no murmurs, rubs, or gallops,no edema  Respiratory:  clear to auscultation bilaterally, normal work of breathing GI: soft, nontender, nondistended, + BS MS: no deformity or atrophy Skin: warm and dry, no rash Neuro:  Strength and sensation are intact Psych: euthymic mood, full affect   EKG:  EKG is not ordered today.    Recent Labs: 07/03/2014: TSH 16.480* 07/05/2014: ALT 26; Pro B Natriuretic peptide (BNP) 821.2* 07/07/2014: Magnesium 2.1 07/10/2014: BUN 24*; Creatinine 0.88; Hemoglobin 8.1*; Platelets 248; Potassium 4.2; Sodium 137    Lipid Panel    Component Value Date/Time   CHOL 147 07/05/2014 1342   TRIG 128 07/05/2014 1342   HDL 52 07/05/2014 1342   HDL 62 08/15/2013 1454   CHOLHDL 2.8 07/05/2014 1342   CHOLHDL 3.6 08/15/2013 1454   VLDL 26 07/05/2014 1342   LDLCALC 69 07/05/2014 1342   LDLCALC 143* 08/15/2013 1454      Wt Readings from Last 3 Encounters:  09/26/14 179 lb 1.9 oz (81.248 kg)  08/06/14 171 lb 8 oz (77.792 kg)  07/17/14 180 lb (81.647 kg)        Other studies Reviewed: Additional studies/ records that were reviewed today include: Echocardiogram prior to surgery. Review of the above records demonstrates: Presurgical EF of 10-15%   ASSESSMENT AND PLAN:  1.  Chronic systolic heart failure/ischemic cardiomyopathy with low EF prior to coronary bypass grafting in November (EF less than 20%). Currently on heart failure therapy but beta blocker dose is nearly homeopathic. 2. Substance abuse including smoking and cocaine use. This was significantly advocated against. 3. Hypertension, controlled 4. Hyperlipidemia   Current medicines are reviewed at length with the patient today.  The patient does not have concerns regarding medicines.  The following changes have been made:  Confirmed beta blocker dose. This therapy needs to be up titrated. I encouraged the patient to discontinue substance use including smoking and cocaine. Refer back to cardiac rehabilitation.  Labs/ tests ordered today include:   Orders Placed This Encounter  Procedures  . 2D Echocardiogram without contrast     Disposition:   FU with Mendel Ryder in 4 months   Signed, [name], MD  09/26/2014 9:50 AM    [facility] [address] [place], [place], [place]  [address] Phone: [phone]; Fax: [phone]

## 2014-09-26 NOTE — Patient Instructions (Signed)
Your physician recommends that you continue on your current medications as directed. Please refer to the Current Medication list given to you today.  Please call the office with the correct dosage of Metoprolol that you are taking. [phone]  Your physician has requested that you have an echocardiogram. Echocardiography is a painless test that uses sound waves to create images of your heart. It provides your doctor with information about the size and shape of your heart and how well your heart's chambers and valves are working. This procedure takes approximately one hour. There are no restrictions for this procedure.   You have been referred to [facility] Phase II Cardiac Re[facility]  Your physician wants you to follow-up in: 4 months with Dr.Smith You will receive a reminder letter in the mail two months in advance. If you don't receive a letter, please call our office to schedule the follow-up appointment.

## 2014-09-27 ENCOUNTER — Telehealth: Payer: Self-pay | Admitting: Interventional Cardiology

## 2014-09-27 ENCOUNTER — Ambulatory Visit (HOSPITAL_COMMUNITY): Payer: Medicare Other | Attending: Cardiology

## 2014-09-27 DIAGNOSIS — E785 Hyperlipidemia, unspecified: Secondary | ICD-10-CM | POA: Insufficient documentation

## 2014-09-27 DIAGNOSIS — Z720 Tobacco use: Secondary | ICD-10-CM | POA: Insufficient documentation

## 2014-09-27 DIAGNOSIS — I10 Essential (primary) hypertension: Secondary | ICD-10-CM | POA: Diagnosis not present

## 2014-09-27 DIAGNOSIS — E119 Type 2 diabetes mellitus without complications: Secondary | ICD-10-CM | POA: Diagnosis not present

## 2014-09-27 DIAGNOSIS — I5023 Acute on chronic systolic (congestive) heart failure: Secondary | ICD-10-CM | POA: Diagnosis present

## 2014-09-27 NOTE — Progress Notes (Signed)
2D Echo completed. Patient refused Definity image enhancer. 09/27/2014

## 2014-09-27 NOTE — Telephone Encounter (Signed)
Returned pt call. Adv pt per Dr.Smith continue taking his current dose of Metoprolol 12.5mg  bid. His echo results are not available yet. Once echo is read and reviewed by Dr.Smith I will call back with results and Dr.Smith recommendations. Pt verbalized understanding.

## 2014-09-27 NOTE — Telephone Encounter (Signed)
Pt c/o medication issue: f/up from OV 1. Name of Medication:  Metroprolol 2. How are you currently taking this medication (dosage and times per day)? 12.5 twice per day... 25 mg per day. 3. Are you having a reaction (difficulty breathing--STAT)? no 4. What is your medication issue?   Comment:  If it makes his heart stronger he would like to increase med and he would also like to discuss ECHO results

## 2014-09-28 ENCOUNTER — Ambulatory Visit: Payer: Self-pay | Admitting: Interventional Cardiology

## 2014-10-01 ENCOUNTER — Ambulatory Visit: Payer: Self-pay | Admitting: Endocrinology

## 2014-10-01 ENCOUNTER — Encounter: Payer: Self-pay | Admitting: Endocrinology

## 2014-10-01 ENCOUNTER — Ambulatory Visit (INDEPENDENT_AMBULATORY_CARE_PROVIDER_SITE_OTHER): Payer: Medicare Other | Admitting: Endocrinology

## 2014-10-01 VITALS — BP 122/78 | HR 95 | Temp 98.0°F | Ht 67.0 in | Wt 180.0 lb

## 2014-10-01 DIAGNOSIS — E1021 Type 1 diabetes mellitus with diabetic nephropathy: Secondary | ICD-10-CM

## 2014-10-01 DIAGNOSIS — I255 Ischemic cardiomyopathy: Secondary | ICD-10-CM

## 2014-10-01 LAB — MICROALBUMIN / CREATININE URINE RATIO
Creatinine,U: 103.2 mg/dL
MICROALB UR: 8.2 mg/dL — AB (ref 0.0–1.9)
MICROALB/CREAT RATIO: 7.9 mg/g (ref 0.0–30.0)

## 2014-10-01 LAB — HEMOGLOBIN A1C: Hgb A1c MFr Bld: 8 % — ABNORMAL HIGH (ref 4.6–6.5)

## 2014-10-01 NOTE — Patient Instructions (Addendum)
check your blood sugar 6 times a day--before the 3 meals, and at bedtime.  also check if you have symptoms of your blood sugar being too high or too low.  please keep a record of the readings and bring it to your next appointment here.  please call us sooner if your blood sugar goes below 70, or if it stays over 200. Please come back for a follow-up appointment in 2 months.   Blood and urine tests are being requested for you today.  We'll let you know about the results.

## 2014-10-01 NOTE — Progress Notes (Signed)
Subjective:    Patient ID: Reginald Gutierrez, male    DOB: Nov 24, 1965, 49 y.o.   MRN: [id number]  HPI Pt returns for f/u of diabetes mellitus: DM type: 1 Dx'ed: 1969. Complications: renal failure, retinopathy, PAD, and CAD.   Therapy: insulin since dx DKA: never Severe hypoglycemia: recurrent, most recently in 2015. Pancreatitis: never   Other: due to noncompliance, he has required a simple qd insulin regimen; in late 2013, he had a episode of severe hypoglycemia.   Interval history: He is doing cardiac rehab.  no cbg record, but states cbg's are mildly low approx once per week.  This can happen at any time of day.  He averages 9-15 units total per day.   Past Medical History  Diagnosis Date  . Diabetes mellitus type 1   . Hyperlipidemia   . Depression   . Umbilical hernia   . Hypertension   . Glaucoma   . CHF (congestive heart failure)   . Hypothyroidism   . Hypoglycemia, unspecified   . Disorder of bone and cartilage, unspecified   . Acute posthemorrhagic anemia   . Anxiety state, unspecified   . Chronic pain syndrome   . Acute respiratory failure   . Dysphagia, oral phase   . Stress fracture of tibia or fibula   . Syncope and collapse   . Coronary artery disease 07/19/2012  . Ischemic cardiomyopathy   . Narcotic dependency, continuous   . Heart attack 12/16/11  . NSTEMI (non-ST elevated myocardial infarction) 12/19/2011; 07/03/2014    ; Hattie Perch 07/03/2014  . 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 Appendectomy      open  . Tibia im nail insertion  12/18/2011    Procedure: INTRAMEDULLARY (IM) NAIL TIBIAL;  Surgeon: Eugenia Mcalpine, MD;  Location: [facility] ORS;  Service: Orthopedics;  Laterality: Bilateral;  . Insertion of dialysis catheter  12/31/2011    Procedure: INSERTION OF DIALYSIS CATHETER;  Surgeon: Chuck Hint, MD;  Location: [facility]  OR;  Service: Vascular;  Laterality: N/A;; "while in ICU; not permanent  . Tonsillectomy  1980  . Fracture surgery  2012    wrist  . Fracture surgery  2013    bilateral tibia  . Cardiac catheterization  07/2012; 01/2014  . Coronary artery bypass graft N/A 07/06/2014    Procedure: CORONARY ARTERY BYPASS GRAFTING (CABG) x4 using left internal mammary artery and bilateral thigh greater saphenous veins;  Surgeon: Purcell Nails, MD;  Location: [facility] OR;  Service: Open Heart Surgery;  Laterality: N/A;  . Intraoperative transesophageal echocardiogram N/A 07/06/2014    Procedure: INTRAOPERATIVE TRANSESOPHAGEAL ECHOCARDIOGRAM;  Surgeon: Purcell Nails, MD;  Location: [facility] OR;  Service: Open Heart Surgery;  Laterality: N/A;  . Patent foramen ovale closure N/A 07/06/2014    Procedure: PATENT FORAMEN OVALE CLOSURE;  Surgeon: Purcell Nails, MD;  Location: [facility] OR;  Service: Open Heart Surgery;  Laterality: N/A;  . Left heart catheterization with coronary angiogram N/A 07/22/2012    Procedure: LEFT HEART CATHETERIZATION WITH CORONARY ANGIOGRAM;  Surgeon: Peter M Swaziland, MD;  Location: [facility] CATH LAB;  Service: Cardiovascular;  Laterality: N/A;  . Left heart catheterization with coronary angiogram N/A 02/13/2014    Procedure: LEFT HEART CATHETERIZATION WITH CORONARY ANGIOGRAM;  Surgeon: [name], MD;  Location: [facility] CATH  LAB;  Service: Cardiovascular;  Laterality: N/A;  . Left heart catheterization with coronary angiogram N/A 07/04/2014    Procedure: LEFT HEART CATHETERIZATION WITH CORONARY ANGIOGRAM;  Surgeon: [name], MD;  Location: [facility] CATH LAB;  Service: Cardiovascular;  Laterality: N/A;    History   Social History  . Marital Status: Single    Spouse Name: N/A    Number of Children: 0  . Years of Education: 16   Occupational History  . Sales- disabled    Social History Main Topics  . Smoking status: Current Some Day Smoker -- 1.00 packs/day    Types: Cigarettes  . Smokeless tobacco:  Never Used  . Alcohol Use: No  . Drug Use: No     Comment: Past Cocaine abuse-2002-2003  . Sexual Activity: No   Other Topics Concern  . Not on file   Social History Narrative    Current Outpatient Prescriptions on File Prior to Visit  Medication Sig Dispense Refill  . ACCU-CHEK AVIVA PLUS test strip TEST 6 TIMES A DAY AS DIRECTED. 150 each 12  . ARIPiprazole (ABILIFY) 5 MG tablet Take 2.5 mg by mouth 2 (two) times daily.    Marland Kitchen aspirin EC 81 MG tablet Take 81 mg by mouth daily.    Marland Kitchen atorvastatin (LIPITOR) 40 MG tablet take 1 tablet by mouth once daily 30 tablet 3  . clonazePAM (KLONOPIN) 1 MG tablet Take 1 mg by mouth daily as needed. FOR ANXIETY AND SLEEP  0  . DULoxetine 40 MG CPEP Take 40 mg by mouth daily. 30 capsule 0  . ferrous sulfate 325 (65 FE) MG tablet Take 325 mg by mouth daily with breakfast.    . furosemide (LASIX) 20 MG tablet Take 1 tablet (20 mg total) by mouth daily. 30 tablet 1  . glucagon (GLUCAGON EMERGENCY) 1 MG injection Inject 1 mg into the muscle once as needed (for low blood sugar). 1 each 2  . glucose blood test strip TEST BLOOD SUGAR 6 TIMES A DAY AS INSTRUCTED BY DR Danesha Kirchoff 200 each 10  . insulin glargine (LANTUS) 100 UNIT/ML injection Inject 15 Units into the skin every morning.     . insulin regular (NOVOLIN R,HUMULIN R) 100 units/mL injection Inject 2-8 Units into the skin See admin instructions. Sliding scale.   Give 2-8 units four times a day    . INSULIN SYRINGE .5CC/29G 29G X 1/2" 0.5 ML MISC 1 Syringe by Does not apply route daily. 30 each 11  . Insulin Syringe-Needle U-100 (B-D INS SYRINGE 0.5CC/30GX1/2") 30G X 1/2" 0.5 ML MISC Use 3-4 times per day 300 each 2  . levothyroxine (SYNTHROID, LEVOTHROID) 125 MCG tablet Take 1 tablet (125 mcg total) by mouth daily before breakfast. 30 tablet 2  . lisinopril (PRINIVIL,ZESTRIL) 40 MG tablet Take 40 mg by mouth daily. TAKES 1/2 TABLET EVERY DAY  0  . metoprolol tartrate (LOPRESSOR) 12.5 mg TABS tablet Take  0.5 tablets (12.5 mg total) by mouth 2 (two) times daily. 60 each 1  . Multiple Vitamin (MULTIVITAMIN WITH MINERALS) TABS tablet Take 1 tablet by mouth daily. Diabetes daily pak from [employer]    . potassium chloride (K-DUR,KLOR-CON) 10 MEQ tablet Take 1 tablet (10 mEq total) by mouth daily. 30 tablet 1   No current facility-administered medications on file prior to visit.    No Known Allergies  Family History  Problem Relation Age of Onset  . Heart disease Father     BP 122/78 mmHg  Pulse  95  Temp(Src) 98 F (36.7 C) (Oral)  Ht 5\' 7"  (1.702 m)  Wt 180 lb (81.647 kg)  BMI 28.19 kg/m2  SpO2 92%  Review of Systems Denies LOC.  He has gained weight.     Objective:   Physical Exam VITAL SIGNS:  See vs page GENERAL: no distress Pulses: dorsalis pedis intact bilat.   MSK: no deformity of the feet CV: trace bilat leg edema Skin:  no ulcer on the feet.  normal color and temp on the feet. Neuro: sensation is intact to touch on the feet  Lab Results  Component Value Date   HGBA1C 8.0* 10/01/2014      Assessment & Plan:  DM: moderate exacerbation.  Noncompliance with cbg recording: I'll work around this as best I can   Patient is advised the following: Patient Instructions  check your blood sugar 6 times a day--before the 3 meals, and at bedtime.  also check if you have symptoms of your blood sugar being too high or too low.  please keep a record of the readings and bring it to your next appointment here.  please call us sooner if your blood sugar goes below 70, or if it stays over 200. Please come back for a follow-up appointment in 2 months.   Blood and urine tests are being requested for you today.  We'll let you know about the results.     increase the lantus to 15 units daily, and take it in the morning.

## 2014-10-03 ENCOUNTER — Telehealth: Payer: Self-pay

## 2014-10-03 NOTE — Telephone Encounter (Signed)
Pt aware of echo results.Significant improvement to near normal function. Continue same therapy.pt verbalized understanding.

## 2014-10-03 NOTE — Telephone Encounter (Signed)
-----   Message from [name], MD sent at 09/27/2014  5:43 PM EST ----- Significant improvement to near normal function. Continue same therapy.

## 2014-10-10 ENCOUNTER — Telehealth (HOSPITAL_COMMUNITY): Payer: Self-pay | Admitting: *Deleted

## 2014-10-10 NOTE — Telephone Encounter (Signed)
Pt scheduled for orientation on tomorrow - 2/11.  Pt noted to have Medicaid insurance.  We have not received back the medicaid form from Dr. Katrinka Blazing.  Message left that pt will need to hold off on attending [facility] orientation until the medicaid order is verified for qualification of this pt by MD, signed and returned to [place].   Contact information given for pt to call back that he received this message. Alanson Aly, BSN

## 2014-10-11 ENCOUNTER — Telehealth: Payer: Self-pay | Admitting: Interventional Cardiology

## 2014-10-11 ENCOUNTER — Inpatient Hospital Stay (HOSPITAL_COMMUNITY): Admission: RE | Admit: 2014-10-11 | Payer: Self-pay | Source: Ambulatory Visit

## 2014-10-11 NOTE — Telephone Encounter (Signed)
Follow up:   Pt called needing the paper work sent to card rehab at [facility] to start the program today.    Pt needs a call back about this please.

## 2014-10-11 NOTE — Telephone Encounter (Signed)
Completed fax located. Was faxed to Cardiac Rehab on Feburary 8, 2016. Will refax today.  I spoke with pt and gave him this information.

## 2014-10-11 NOTE — Telephone Encounter (Signed)
Form refaxed. I spoke with Reginald Gutierrez in cardiac rehab and confirmed she has received it.

## 2014-10-17 ENCOUNTER — Ambulatory Visit (HOSPITAL_COMMUNITY): Payer: Self-pay

## 2014-10-18 ENCOUNTER — Encounter (HOSPITAL_COMMUNITY)
Admission: RE | Admit: 2014-10-18 | Discharge: 2014-10-18 | Disposition: A | Discharge status: Home / Self Care | Payer: Medicare Other | Source: Ambulatory Visit | Attending: Interventional Cardiology | Admitting: Interventional Cardiology

## 2014-10-18 DIAGNOSIS — I252 Old myocardial infarction: Secondary | ICD-10-CM | POA: Insufficient documentation

## 2014-10-18 DIAGNOSIS — Z7982 Long term (current) use of aspirin: Secondary | ICD-10-CM | POA: Insufficient documentation

## 2014-10-18 DIAGNOSIS — E1300 Other specified diabetes mellitus with hyperosmolarity without nonketotic hyperglycemic-hyperosmolar coma (NKHHC): Secondary | ICD-10-CM | POA: Insufficient documentation

## 2014-10-18 DIAGNOSIS — Q211 Atrial septal defect: Secondary | ICD-10-CM | POA: Insufficient documentation

## 2014-10-18 DIAGNOSIS — I2582 Chronic total occlusion of coronary artery: Secondary | ICD-10-CM | POA: Insufficient documentation

## 2014-10-18 DIAGNOSIS — I10 Essential (primary) hypertension: Secondary | ICD-10-CM | POA: Insufficient documentation

## 2014-10-18 DIAGNOSIS — E039 Hypothyroidism, unspecified: Secondary | ICD-10-CM | POA: Insufficient documentation

## 2014-10-18 DIAGNOSIS — F17210 Nicotine dependence, cigarettes, uncomplicated: Secondary | ICD-10-CM | POA: Insufficient documentation

## 2014-10-18 DIAGNOSIS — I2510 Atherosclerotic heart disease of native coronary artery without angina pectoris: Secondary | ICD-10-CM | POA: Insufficient documentation

## 2014-10-18 DIAGNOSIS — Z794 Long term (current) use of insulin: Secondary | ICD-10-CM | POA: Insufficient documentation

## 2014-10-18 DIAGNOSIS — I255 Ischemic cardiomyopathy: Secondary | ICD-10-CM | POA: Insufficient documentation

## 2014-10-18 DIAGNOSIS — Z79891 Long term (current) use of opiate analgesic: Secondary | ICD-10-CM | POA: Insufficient documentation

## 2014-10-18 DIAGNOSIS — Z5189 Encounter for other specified aftercare: Secondary | ICD-10-CM | POA: Insufficient documentation

## 2014-10-18 DIAGNOSIS — E785 Hyperlipidemia, unspecified: Secondary | ICD-10-CM | POA: Insufficient documentation

## 2014-10-18 DIAGNOSIS — Z951 Presence of aortocoronary bypass graft: Secondary | ICD-10-CM | POA: Insufficient documentation

## 2014-10-18 DIAGNOSIS — Z79899 Other long term (current) drug therapy: Secondary | ICD-10-CM | POA: Insufficient documentation

## 2014-10-18 DIAGNOSIS — I5022 Chronic systolic (congestive) heart failure: Secondary | ICD-10-CM | POA: Insufficient documentation

## 2014-10-18 NOTE — Progress Notes (Signed)
Cardiac Rehab Medication Review by a Pharmacist  Does the patient  feel that his/her medications are working for him/her?  No - patient feels they need to be stronger  Has the patient been experiencing any side effects to the medications prescribed?  no  Does the patient measure his/her own blood pressure or blood glucose at home?  Yes (measures sugar not blood pressure)  Does the patient have any problems obtaining medications due to transportation or finances?   no  Understanding of regimen: good Understanding of indications: good Potential of compliance: good  Pharmacist comments: Patient feels his psychiatric medications (klonopin and abilify) are too weak and they need to be increased.  He states his blood sugars are out of control.  He does not measure his blood pressure at home, but he does measure his sugars.  Patient does not have any barriers to obtaining medications and he feels his other medications for his cholesterol and blood pressure are working.   Cassie L. Roseanne Reno, PharmD Clinical Pharmacy Resident Pager: [phone] 10/18/2014 8:40 AM

## 2014-10-19 ENCOUNTER — Ambulatory Visit (HOSPITAL_COMMUNITY): Payer: Self-pay

## 2014-10-22 ENCOUNTER — Encounter (HOSPITAL_COMMUNITY): Payer: Self-pay

## 2014-10-22 ENCOUNTER — Encounter (HOSPITAL_COMMUNITY)
Admission: RE | Admit: 2014-10-22 | Discharge: 2014-10-22 | Disposition: A | Discharge status: Home / Self Care | Payer: Medicare Other | Source: Ambulatory Visit | Attending: Interventional Cardiology | Admitting: Interventional Cardiology

## 2014-10-22 ENCOUNTER — Encounter (HOSPITAL_COMMUNITY): Payer: Medicare Other

## 2014-10-22 DIAGNOSIS — Z5189 Encounter for other specified aftercare: Secondary | ICD-10-CM | POA: Diagnosis present

## 2014-10-22 DIAGNOSIS — F17210 Nicotine dependence, cigarettes, uncomplicated: Secondary | ICD-10-CM | POA: Diagnosis not present

## 2014-10-22 DIAGNOSIS — I10 Essential (primary) hypertension: Secondary | ICD-10-CM | POA: Diagnosis not present

## 2014-10-22 DIAGNOSIS — E1300 Other specified diabetes mellitus with hyperosmolarity without nonketotic hyperglycemic-hyperosmolar coma (NKHHC): Secondary | ICD-10-CM | POA: Diagnosis not present

## 2014-10-22 DIAGNOSIS — Z79899 Other long term (current) drug therapy: Secondary | ICD-10-CM | POA: Diagnosis not present

## 2014-10-22 DIAGNOSIS — Q211 Atrial septal defect: Secondary | ICD-10-CM | POA: Diagnosis not present

## 2014-10-22 DIAGNOSIS — E039 Hypothyroidism, unspecified: Secondary | ICD-10-CM | POA: Diagnosis not present

## 2014-10-22 DIAGNOSIS — Z7982 Long term (current) use of aspirin: Secondary | ICD-10-CM | POA: Diagnosis not present

## 2014-10-22 DIAGNOSIS — Z951 Presence of aortocoronary bypass graft: Secondary | ICD-10-CM | POA: Diagnosis not present

## 2014-10-22 DIAGNOSIS — I5022 Chronic systolic (congestive) heart failure: Secondary | ICD-10-CM | POA: Diagnosis not present

## 2014-10-22 DIAGNOSIS — E785 Hyperlipidemia, unspecified: Secondary | ICD-10-CM | POA: Diagnosis not present

## 2014-10-22 DIAGNOSIS — Z79891 Long term (current) use of opiate analgesic: Secondary | ICD-10-CM | POA: Diagnosis not present

## 2014-10-22 DIAGNOSIS — Z794 Long term (current) use of insulin: Secondary | ICD-10-CM | POA: Diagnosis not present

## 2014-10-22 DIAGNOSIS — I2582 Chronic total occlusion of coronary artery: Secondary | ICD-10-CM | POA: Diagnosis not present

## 2014-10-22 DIAGNOSIS — I255 Ischemic cardiomyopathy: Secondary | ICD-10-CM | POA: Diagnosis not present

## 2014-10-22 DIAGNOSIS — I252 Old myocardial infarction: Secondary | ICD-10-CM | POA: Diagnosis not present

## 2014-10-22 DIAGNOSIS — I2510 Atherosclerotic heart disease of native coronary artery without angina pectoris: Secondary | ICD-10-CM | POA: Diagnosis not present

## 2014-10-22 LAB — GLUCOSE, CAPILLARY
GLUCOSE-CAPILLARY: 212 mg/dL — AB (ref 70–99)
Glucose-Capillary: 273 mg/dL — ABNORMAL HIGH (ref 70–99)

## 2014-10-22 NOTE — Progress Notes (Addendum)
Pt started cardiac rehab today.  Pt tolerated light exercise without difficulty.  VSS, telemetry-NSR, nsstt changes, occasional PVC with couplets.   PHQ-18.  Pt reports he is currently under treatment of a psychiatrist which he feels is effective. Pt reports he has long term history of depression and symptoms are better now than in past.  Pt presently is most discouraged with his lack of physical ability to be active.  Pt goals for cardiac rehab are to improve his physical endurance and stamina.  Pt would like to have more energy to walk his dog, a pekinese.  Pt enjoys watching TV, specifically action movies and spending time with his dog.   Pt reports he is not currently smoking, has not smoked in past 4 days.  Pt also denies current recreational drug use, specifically denies marijuana or cocaine use. Pt has some difficulty with recalling his home insulin regimen, pt undecided about his novolin sliding scale instructions, stating "I just give myself what I think I need."   Pt oriented to exercise equipment and routine.  Understanding verbalized.

## 2014-10-24 ENCOUNTER — Encounter (HOSPITAL_COMMUNITY)
Admission: RE | Admit: 2014-10-24 | Discharge: 2014-10-24 | Disposition: A | Discharge status: Home / Self Care | Payer: Medicare Other | Source: Ambulatory Visit | Attending: Interventional Cardiology | Admitting: Interventional Cardiology

## 2014-10-24 ENCOUNTER — Encounter (HOSPITAL_COMMUNITY): Payer: Medicare Other

## 2014-10-24 ENCOUNTER — Ambulatory Visit (HOSPITAL_COMMUNITY): Payer: Self-pay

## 2014-10-24 DIAGNOSIS — Z5189 Encounter for other specified aftercare: Secondary | ICD-10-CM | POA: Diagnosis not present

## 2014-10-24 LAB — GLUCOSE, CAPILLARY: GLUCOSE-CAPILLARY: 214 mg/dL — AB (ref 70–99)

## 2014-10-25 ENCOUNTER — Other Ambulatory Visit: Payer: Self-pay | Admitting: Internal Medicine

## 2014-10-26 ENCOUNTER — Ambulatory Visit (HOSPITAL_COMMUNITY): Payer: Self-pay

## 2014-10-26 ENCOUNTER — Other Ambulatory Visit: Payer: Self-pay | Admitting: *Deleted

## 2014-10-26 ENCOUNTER — Encounter (HOSPITAL_COMMUNITY): Payer: Medicare Other

## 2014-10-26 MED ORDER — METOPROLOL TARTRATE 25 MG PO TABS: 12.5000 mg | ORAL_TABLET | Freq: Two times a day (BID) | ORAL | Status: AC

## 2014-10-29 ENCOUNTER — Telehealth: Payer: Self-pay

## 2014-10-29 ENCOUNTER — Encounter (HOSPITAL_COMMUNITY)
Admission: RE | Admit: 2014-10-29 | Discharge: 2014-10-29 | Disposition: A | Discharge status: Home / Self Care | Payer: Medicare Other | Source: Ambulatory Visit | Attending: Interventional Cardiology | Admitting: Interventional Cardiology

## 2014-10-29 ENCOUNTER — Encounter (HOSPITAL_COMMUNITY): Payer: Medicare Other

## 2014-10-29 DIAGNOSIS — Z5189 Encounter for other specified aftercare: Secondary | ICD-10-CM | POA: Diagnosis not present

## 2014-10-29 LAB — GLUCOSE, CAPILLARY
GLUCOSE-CAPILLARY: 62 mg/dL — AB (ref 70–99)
Glucose-Capillary: 40 mg/dL — CL (ref 70–99)
Glucose-Capillary: 94 mg/dL (ref 70–99)

## 2014-10-29 NOTE — Progress Notes (Signed)
Pt CBG- 40 upon arrival to cardiac rehab today. Pt asymptomatic.  Pt reports his fasting AM blood sugar was 353 around 8am. He took 6 units insulin.   He ate 2 eggs, bacon, toast and oatmeal.  Pt states around 1030 am his CBG- 160. He ate late lunch around 1pm of salad only.  He was very excited today having visited a friend in the hospital and pt reports emotional surges often cause his blood sugar to drop.  Pt treated with glucose gel and ginger ale.  Recheck CBG-62.  Pt then given banana and gingerale.  Recheck CBG-94.  Pt given peanut butter.  Dr. George Hugh nurse Lindie Spruce made aware.  She will review with Dr. Everardo All and further advise PRN.

## 2014-10-29 NOTE — Telephone Encounter (Signed)
Cardiac Rehab calling stating this morning when the pt arrived his blood sugar was 40. Joann the nurse at cardiac rehab was able to get his sugar up to 92. Pt informed the nurse this morning his fasting blood sugar was 353 and he took 6 units of regular insulin. Wanted MD to be advised.

## 2014-10-29 NOTE — Telephone Encounter (Signed)
Contacted pt and he states that he had a bad day today and would like to pass on a appointment at this time if at all possible. Pt states he has not been having a lot of lows and does not think he needs a appointment at this time.

## 2014-10-29 NOTE — Telephone Encounter (Signed)
Ov this week

## 2014-10-31 ENCOUNTER — Encounter (HOSPITAL_COMMUNITY)
Admission: RE | Admit: 2014-10-31 | Discharge: 2014-10-31 | Disposition: A | Discharge status: Home / Self Care | Payer: Medicare Other | Source: Ambulatory Visit | Attending: Interventional Cardiology | Admitting: Interventional Cardiology

## 2014-10-31 ENCOUNTER — Encounter (HOSPITAL_COMMUNITY): Payer: Medicare Other

## 2014-10-31 ENCOUNTER — Ambulatory Visit (HOSPITAL_COMMUNITY): Payer: Self-pay

## 2014-10-31 DIAGNOSIS — I2510 Atherosclerotic heart disease of native coronary artery without angina pectoris: Secondary | ICD-10-CM | POA: Diagnosis not present

## 2014-10-31 DIAGNOSIS — E1300 Other specified diabetes mellitus with hyperosmolarity without nonketotic hyperglycemic-hyperosmolar coma (NKHHC): Secondary | ICD-10-CM | POA: Diagnosis not present

## 2014-10-31 DIAGNOSIS — I252 Old myocardial infarction: Secondary | ICD-10-CM | POA: Insufficient documentation

## 2014-10-31 DIAGNOSIS — Z951 Presence of aortocoronary bypass graft: Secondary | ICD-10-CM | POA: Insufficient documentation

## 2014-10-31 DIAGNOSIS — F17210 Nicotine dependence, cigarettes, uncomplicated: Secondary | ICD-10-CM | POA: Diagnosis not present

## 2014-10-31 DIAGNOSIS — I255 Ischemic cardiomyopathy: Secondary | ICD-10-CM | POA: Insufficient documentation

## 2014-10-31 DIAGNOSIS — Z79899 Other long term (current) drug therapy: Secondary | ICD-10-CM | POA: Diagnosis not present

## 2014-10-31 DIAGNOSIS — E039 Hypothyroidism, unspecified: Secondary | ICD-10-CM | POA: Insufficient documentation

## 2014-10-31 DIAGNOSIS — I10 Essential (primary) hypertension: Secondary | ICD-10-CM | POA: Insufficient documentation

## 2014-10-31 DIAGNOSIS — Z79891 Long term (current) use of opiate analgesic: Secondary | ICD-10-CM | POA: Insufficient documentation

## 2014-10-31 DIAGNOSIS — Z7982 Long term (current) use of aspirin: Secondary | ICD-10-CM | POA: Insufficient documentation

## 2014-10-31 DIAGNOSIS — E785 Hyperlipidemia, unspecified: Secondary | ICD-10-CM | POA: Diagnosis not present

## 2014-10-31 DIAGNOSIS — Z794 Long term (current) use of insulin: Secondary | ICD-10-CM | POA: Insufficient documentation

## 2014-10-31 DIAGNOSIS — I2582 Chronic total occlusion of coronary artery: Secondary | ICD-10-CM | POA: Insufficient documentation

## 2014-10-31 DIAGNOSIS — Z5189 Encounter for other specified aftercare: Secondary | ICD-10-CM | POA: Diagnosis present

## 2014-10-31 DIAGNOSIS — Q211 Atrial septal defect: Secondary | ICD-10-CM | POA: Diagnosis not present

## 2014-10-31 DIAGNOSIS — I5022 Chronic systolic (congestive) heart failure: Secondary | ICD-10-CM | POA: Insufficient documentation

## 2014-10-31 NOTE — Progress Notes (Signed)
Pt CBG-498 upon arrival to cardiac rehab today.  Pt asymptomatic.  Urine tested positive for small ketones.  Pt admits to eating extra sugar in preparation for exercise today to make sure blood sugar high enough to exercise.  Pt reports am CBG-62 this morning.  He drank coffee with 3-4 teaspoons sugar.  Repeat CBG-148 a few hours later.  Pt angered with RN assessment of his routine today and does not answer any further questions.  Pt refused to meet with Mickle Plumb, RD, Certified diabetes educator.  Pt advised of dangers associated with extremely high and low blood sugars including risk for ketoacidosis.  Pt advised of need to contact Dr. Everardo All of blood sugar reading for medical advice.  Pt states he does not want Dr. Everardo All informed.  Pt advised per departmental  protocol Dr. Everardo All would need to be notified.    Pt becomes very angry and defensive and left department.  Left message for Dr. George Hugh nurse to return call.

## 2014-11-01 ENCOUNTER — Encounter: Payer: Self-pay | Admitting: Endocrinology

## 2014-11-01 ENCOUNTER — Telehealth: Payer: Self-pay | Admitting: Endocrinology

## 2014-11-01 LAB — GLUCOSE, CAPILLARY: Glucose-Capillary: 498 mg/dL — ABNORMAL HIGH (ref 70–99)

## 2014-11-01 NOTE — Telephone Encounter (Signed)
      Dear Dr. Everardo All,   Pt CBG-498 upon arrival to cardiac rehab today. Urine -positive for small ketones. Pt did not exercise. Pt states he was hypoglycemic this morning (62) therefore drank coffee with "3-4" teaspoons of sugar to bring it up. Pt refused to speak to our diabetes educator today to discuss recommendations. Pt also refused to schedule appt in your office as you advised. In fact, pt became angry when I told him I would notify you of this hyperglycemic result today. He was given written and verbal information about appropriate snacks to have pre exercise and safe exercise blood sugar ranges. I will continue to encourage him to schedule office visit for you to assess and will continue to encourage conversations with our diabetic educator to increase compliance with regimen.   Thank you,  Deveron Furlong, RN, BSN  Cardiac Pulmonary Rehab    Response: pt is d/c from the practice

## 2014-11-02 ENCOUNTER — Telehealth: Payer: Self-pay | Admitting: Endocrinology

## 2014-11-02 ENCOUNTER — Encounter (HOSPITAL_COMMUNITY): Payer: Medicare Other

## 2014-11-02 ENCOUNTER — Ambulatory Visit (HOSPITAL_COMMUNITY): Payer: Self-pay

## 2014-11-02 ENCOUNTER — Telehealth (HOSPITAL_COMMUNITY): Payer: Self-pay | Admitting: Cardiac Rehabilitation

## 2014-11-02 NOTE — Telephone Encounter (Signed)
pc to pt to discuss reason for absence from cardiac rehab.  Left message on answering machine

## 2014-11-02 NOTE — Telephone Encounter (Signed)
Patient dismissed from [facility] by Dr. Everardo All on 11/01/2014. Dismissal letter sent out by certified mail on 11/05/2014.djc

## 2014-11-05 ENCOUNTER — Encounter (HOSPITAL_COMMUNITY)
Admission: RE | Admit: 2014-11-05 | Discharge: 2014-11-05 | Disposition: A | Discharge status: Home / Self Care | Payer: Medicare Other | Source: Ambulatory Visit | Attending: Interventional Cardiology | Admitting: Interventional Cardiology

## 2014-11-05 ENCOUNTER — Encounter (HOSPITAL_COMMUNITY): Payer: Medicare Other

## 2014-11-05 DIAGNOSIS — Z5189 Encounter for other specified aftercare: Secondary | ICD-10-CM | POA: Diagnosis not present

## 2014-11-05 LAB — GLUCOSE, CAPILLARY
GLUCOSE-CAPILLARY: 230 mg/dL — AB (ref 70–99)
Glucose-Capillary: 393 mg/dL — ABNORMAL HIGH (ref 70–99)

## 2014-11-05 MED FILL — Glucose Gel 40%: ORAL | Qty: 1 | Status: AC

## 2014-11-05 NOTE — Progress Notes (Signed)
Pt CBG-230 upon arrival to cardiac rehab. Pt exercised without difficulty. Post exercise CBG-393.  Pt does admit to taking glucose gel prior to exercise to make CBG high enough  To exercise. Pt instructed to not take glucose gel unless CBG <60.  Pt instructed on appropriate carbohydrate and protein snacks to have pre exercise.  Pt did bring his glucometer with him to exercise today.  Pt verbalized understanding.

## 2014-11-07 ENCOUNTER — Encounter (HOSPITAL_COMMUNITY): Payer: Medicare Other

## 2014-11-07 ENCOUNTER — Ambulatory Visit (HOSPITAL_COMMUNITY): Payer: Self-pay

## 2014-11-07 ENCOUNTER — Encounter (HOSPITAL_COMMUNITY)
Admission: RE | Admit: 2014-11-07 | Discharge: 2014-11-07 | Disposition: A | Discharge status: Home / Self Care | Payer: Medicare Other | Source: Ambulatory Visit | Attending: Interventional Cardiology | Admitting: Interventional Cardiology

## 2014-11-07 DIAGNOSIS — Z5189 Encounter for other specified aftercare: Secondary | ICD-10-CM | POA: Diagnosis not present

## 2014-11-08 LAB — GLUCOSE, CAPILLARY: GLUCOSE-CAPILLARY: 154 mg/dL — AB (ref 70–99)

## 2014-11-09 ENCOUNTER — Encounter (HOSPITAL_COMMUNITY): Payer: Medicare Other

## 2014-11-09 ENCOUNTER — Ambulatory Visit (HOSPITAL_COMMUNITY): Payer: Self-pay

## 2014-11-12 ENCOUNTER — Encounter (HOSPITAL_COMMUNITY)
Admission: RE | Admit: 2014-11-12 | Discharge: 2014-11-12 | Disposition: A | Discharge status: Home / Self Care | Payer: Medicare Other | Source: Ambulatory Visit | Attending: Interventional Cardiology | Admitting: Interventional Cardiology

## 2014-11-12 ENCOUNTER — Encounter (HOSPITAL_COMMUNITY): Payer: Medicare Other

## 2014-11-12 DIAGNOSIS — Z5189 Encounter for other specified aftercare: Secondary | ICD-10-CM | POA: Diagnosis not present

## 2014-11-12 LAB — GLUCOSE, CAPILLARY: Glucose-Capillary: 61 mg/dL — ABNORMAL LOW (ref 70–99)

## 2014-11-12 NOTE — Progress Notes (Signed)
Pt arrived at cardiac rehab CBG-61. Pt asymptomatic.  Pt given gingerale and glucose gel.  30 minute recheck CBG-146.  Pt reports he ate bolgona sandwich, 1/2 grapefruit, peanut butter cracker with water 30 minutes prior to exercise and had boiled egg, bacon and grits around 9:30 this morning for breakfast. Pt denies taking insulin today.  Pt reports his CBG was "low" this morning and 91 prior to leaving his house to come to cardiac rehab.  Pt does admit to taking increased novolog last night to treat elevated blood sugar.  Pt did not exercise today.  Pt met with Derek Mound, RD, Diabetes Educator.  Pt verbalizes he knows he needs to take more action to improve his health and control his blood sugar by eating more nutritionally balanced meals. Pt is disappointed about inability to exercise at cardiac rehab today however does not display anger.  Pt would like to establish care with PCP.  Referral will be made on patient behalf.  Pt instructed to check blood sugar prior to leaving home before coming to exercise. Pt reminded of cardiac rehab policy of withholding exercise for CBG<100 or >300.  Pt verbalized understanding.

## 2014-11-12 NOTE — Progress Notes (Addendum)
Reginald Gutierrez 49 y.o. male Nutrition Note Spoke with pt. Nutrition Plan and Nutrition Survey goals reviewed with pt. Pt is not following the Therapeutic Lifestyle Changes diet. Pt wants to lose wt and reports he is struggling to lose wt. "I'm just not taking care of myself right now." Pt eating grapefruit to try and assist him in his wt loss effort. Pt educated re: the need to avoid grapefruit with statin medication. Pt stated, "OK, but I'm still going to eat grapefruit." Pt is diabetic. Last A1c indicates blood glucose poorly controlled. Per discussion, pt has been "managing my blood sugar for a long time." Spoke with pt re: not exercising due to hypo and hyperglycemia as an opportunity to manage DM better. Pt states he has had "low blood sugars all morning and I haven't taken my insulin." Pt stated his blood glucose was "low" this morning. Recommended CBG range for fasting reviewed with pt. Per discussion, feel pt is fairly educated about his DM. Implementation of DM knowledge is where pt has the most difficulty at this time. Per discussion with Reginald Furlong, RN feel pt is either a poor historian re: food choices or leaves out food choices he knows are undesirable. This Clinical research associate went over Diabetes Education test results. Pt expressed understanding of the information reviewed. Pt aware of nutrition education classes offered. Lab Results  Component Value Date   HGBA1C 8.0* 10/01/2014  CBG/ food recall Bedtime CBG 264 mg/dL -- pt gave himself Lantus and 14 units of SSI Fasting CBG 91 mg/dL "low" per pt Breakfast - grits, spray butter, 4 slices bacon 1 boiled egg, black coffee Pre-exercise "snack" - less than 1/2 of a bologna and cheese sandwich, Fig [place], 9 Ritz crackers, vitamin water, Orange juice from [employer].  Pre-exercise CBG 53 mg/dL on pt's glucometer and 61 mg/dL on the hospital glucometer.  Pt given glucose gel and a Ginger ale. CBG JX:[id number] on pt meter was 146 mg/dL.  Nutrition  Diagnosis ? Food-and nutrition-related knowledge deficit related to lack of exposure to information as related to diagnosis of: ? CVD ? DM ? Obesity related to excessive energy intake as evidenced by a BMI of 30.6  Nutrition RX/ Estimated Daily Nutrition Needs for: wt loss 1500-2000 Kcal, 40-55 gm fat, 10-13 gm sat fat, 1.4-2.0 gm trans-fat, <1500 mg sodium, 175-250 gm CHO   Nutrition Intervention ? Pt's individual nutrition plan reviewed with pt. ? Benefits of adopting Therapeutic Lifestyle Changes discussed when Medficts reviewed. ? Pt instructed to avoid Grapefruit due to statin use ? Pt to attend the Portion Distortion class ? Pt to attend the Diabetes Q & A class ? Pt to attend the   ? Nutrition I class                  ? Nutrition II class     ? Diabetes Blitz class ? Pt given handouts for: ? Nutrition I class ? Nutrition II class ? Diabetes Blitz Class ? Continue client-centered nutrition education by RD, as part of interdisciplinary care. Goal(s) ? Pt to identify and limit food sources of saturated fat, trans fat, and cholesterol ? Pt to eat at least 1 fruit with 1 meal/day ? Pt to eat at least 1 vegetable with 1 meal/d ? Pt to identify food quantities necessary to achieve: ? wt loss to a goal wt of 164-184 lb (74.4-82.6 kg) at graduation from cardiac rehab.  ? CBG concentrations in the normal range or as close to normal as  is safely possible. Monitor and Evaluate progress toward nutrition goal with team. Mickle Plumb, M.Ed, RD, LDN, CDE 11/12/2014 2:32 PM

## 2014-11-14 ENCOUNTER — Encounter (HOSPITAL_COMMUNITY): Payer: Medicare Other

## 2014-11-14 ENCOUNTER — Telehealth (HOSPITAL_COMMUNITY): Payer: Self-pay | Admitting: Cardiac Rehabilitation

## 2014-11-14 ENCOUNTER — Ambulatory Visit (HOSPITAL_COMMUNITY): Payer: Self-pay

## 2014-11-14 NOTE — Telephone Encounter (Signed)
pc to pt to assess reason for absence from cardiac rehab today. Pt reports he "just didn't make it".  Pt states his blood sugar was "low" earlier today but he "worked on it" and now normal.  Pt given information about new patient appointment scheduled with Dr. Karren Cobble November 20, 2014 @ 2:15.  Roma Schanz.  Pt given office  address and phone number.understanding verbalized   Pt states he possibly will attend cardiac rehab on  Friday.

## 2014-11-16 ENCOUNTER — Ambulatory Visit (HOSPITAL_COMMUNITY): Payer: Self-pay

## 2014-11-16 ENCOUNTER — Encounter (HOSPITAL_COMMUNITY): Payer: Medicare Other

## 2014-11-19 ENCOUNTER — Encounter (HOSPITAL_COMMUNITY): Payer: Medicare Other

## 2014-11-20 ENCOUNTER — Other Ambulatory Visit (INDEPENDENT_AMBULATORY_CARE_PROVIDER_SITE_OTHER): Payer: Medicare Other

## 2014-11-20 ENCOUNTER — Encounter: Payer: Self-pay | Admitting: Internal Medicine

## 2014-11-20 ENCOUNTER — Telehealth: Payer: Self-pay | Admitting: Geriatric Medicine

## 2014-11-20 ENCOUNTER — Ambulatory Visit (INDEPENDENT_AMBULATORY_CARE_PROVIDER_SITE_OTHER): Payer: Medicare Other | Admitting: Internal Medicine

## 2014-11-20 VITALS — BP 110/64 | HR 63 | Temp 97.9°F | Resp 18 | Ht 67.0 in | Wt 191.8 lb

## 2014-11-20 DIAGNOSIS — E1065 Type 1 diabetes mellitus with hyperglycemia: Secondary | ICD-10-CM

## 2014-11-20 DIAGNOSIS — E1021 Type 1 diabetes mellitus with diabetic nephropathy: Secondary | ICD-10-CM

## 2014-11-20 DIAGNOSIS — F1120 Opioid dependence, uncomplicated: Secondary | ICD-10-CM

## 2014-11-20 DIAGNOSIS — G894 Chronic pain syndrome: Secondary | ICD-10-CM | POA: Diagnosis not present

## 2014-11-20 DIAGNOSIS — IMO0002 Reserved for concepts with insufficient information to code with codable children: Secondary | ICD-10-CM

## 2014-11-20 DIAGNOSIS — I5022 Chronic systolic (congestive) heart failure: Secondary | ICD-10-CM

## 2014-11-20 DIAGNOSIS — I255 Ischemic cardiomyopathy: Secondary | ICD-10-CM | POA: Diagnosis not present

## 2014-11-20 DIAGNOSIS — F1920 Other psychoactive substance dependence, uncomplicated: Secondary | ICD-10-CM | POA: Diagnosis not present

## 2014-11-20 DIAGNOSIS — K439 Ventral hernia without obstruction or gangrene: Secondary | ICD-10-CM

## 2014-11-20 LAB — COMPREHENSIVE METABOLIC PANEL
ALBUMIN: 4.1 g/dL (ref 3.5–5.2)
ALT: 26 U/L (ref 0–53)
AST: 21 U/L (ref 0–37)
Alkaline Phosphatase: 155 U/L — ABNORMAL HIGH (ref 39–117)
BUN: 16 mg/dL (ref 6–23)
CO2: 32 meq/L (ref 19–32)
Calcium: 9.4 mg/dL (ref 8.4–10.5)
Chloride: 101 mEq/L (ref 96–112)
Creatinine, Ser: 0.91 mg/dL (ref 0.40–1.50)
GFR: 94.27 mL/min (ref >60.00)
GLUCOSE: 48 mg/dL — AB (ref 70–99)
POTASSIUM: 4.4 meq/L (ref 3.5–5.1)
Sodium: 138 mEq/L (ref 135–145)
TOTAL PROTEIN: 8 g/dL (ref 6.0–8.3)
Total Bilirubin: 0.5 mg/dL (ref 0.2–1.2)

## 2014-11-20 LAB — BRAIN NATRIURETIC PEPTIDE: PRO B NATRI PEPTIDE: 211 pg/mL — AB (ref 0.0–100.0)

## 2014-11-20 MED ORDER — LIDOCAINE 5 % EX PTCH: 1.0000 | MEDICATED_PATCH | CUTANEOUS | Status: DC

## 2014-11-20 NOTE — Telephone Encounter (Signed)
Patient's blood sugar came back at 48. Per Dr. Dorise Hiss call patient to see how he is feeling. She said to advise him to eat a piece of hard candy. I called him and I had to leave a message on his voice mail.

## 2014-11-20 NOTE — Patient Instructions (Addendum)
We will check on your blood work today to see how the heart is doing.  I think that we should try a patch with pain medicine in it for the shoulder so that you can do what you need to and we will wait on the lab results to make sure the kidneys and the liver can handle pain medicine.   Your diabetes and your heart are the most important things and you need to work on getting those better. You only have one heart and cannot heal the damage that has been done but you can prevent further damage. In the meantime we would feel more comfortable if a pain medicine doctor was handling the pain medicine since they are more familiar with that that I am.   We would like to see you back in about 3 months to see how you are doing.

## 2014-11-21 ENCOUNTER — Ambulatory Visit (HOSPITAL_COMMUNITY): Payer: Self-pay

## 2014-11-21 ENCOUNTER — Encounter (HOSPITAL_COMMUNITY): Admission: RE | Admit: 2014-11-21 | Payer: Medicare Other | Source: Ambulatory Visit

## 2014-11-21 ENCOUNTER — Encounter (HOSPITAL_COMMUNITY): Payer: Medicare Other

## 2014-11-22 ENCOUNTER — Encounter: Payer: Self-pay | Admitting: Internal Medicine

## 2014-11-22 LAB — DRUG SCREEN PANEL (SERUM)

## 2014-11-22 NOTE — Assessment & Plan Note (Signed)
He has been type 1 diabetic for many years and unfortunately he has been kicked out of [facility] per earlier this month (he was not aware). Will refer to alternative endocrinology. Seems he has very poor compliance and control in the past. He is not on ideal regimen and will not adjust at this time due to his concern of low sugars he is not willing to change.

## 2014-11-22 NOTE — Progress Notes (Signed)
   Subjective:    Patient ID: Reginald Gutierrez, male    DOB: Mar 11, 1966, 49 y.o.   MRN: [id number]  HPI The patient is a new 49 YO man who is coming in for pain. He is with his mother today and she helps to provide history (although he seems to get irritated at her for telling things he did not want me to know such as the fact that he has been kicked out of 2 other clinics although she does not mention the reason why). He states that narcotics are the only thing that help his shoulder pain (left). It has been had for several years and he has tried physical therapy. He has tried OTC pain medications which help some but not much. Pain 8/10 most of the time. Limited ROM due to pain. He is also a type 1 diabetic and follows with his sugars. He has not done well with those overall although he is trying to be better lately. He is on a single insulin regimen.   PMH, [facility], social history reviewed and updated during today's visit.   Review of Systems  Constitutional: Negative.   HENT: Negative.   Eyes: Negative.   Respiratory: Negative.   Cardiovascular: Negative.   Gastrointestinal: Negative.   Musculoskeletal: Positive for back pain and arthralgias. Negative for myalgias.  Skin: Negative.   Neurological: Negative for dizziness, weakness and numbness.  Psychiatric/Behavioral: Positive for agitation.      Objective:   Physical Exam  Constitutional: He appears well-developed and well-nourished.  Overweight  HENT:  Head: Normocephalic and atraumatic.  Eyes: EOM are normal.  Neck: Normal range of motion.  Cardiovascular: Normal rate and regular rhythm.   Pulmonary/Chest: Effort normal and breath sounds normal.  Abdominal: Soft.  Musculoskeletal: He exhibits tenderness.  Left shoulder pain and limited active ROM.   Neurological: Coordination normal.  Skin: Skin is warm and dry.  Psychiatric: He has a normal mood and affect.   Filed Vitals:   11/20/14 1401  BP: 110/64  Pulse: 63  Temp: 97.9 F  (36.6 C)  TempSrc: Oral  Resp: 18  Height: 5\' 7"  (1.702 m)  Weight: 191 lb 12.8 oz (87 kg)  SpO2: 96%      Assessment & Plan:

## 2014-11-22 NOTE — Assessment & Plan Note (Signed)
Currently not taking narcotics and actively seeking them. Referral to pain management and no rx for controlled substances. Checking drug screen today. Past admittance of cocaine as recently as January this year. He does not want to talk about that today and gets angry about any insinuation of drug dependence and aggravated with people who have said that about him in the past.

## 2014-11-22 NOTE — Assessment & Plan Note (Signed)
Talked to him today about the fact that his heart has permanent damage and anything that bothers it such as diabetes or high blood pressure could cause more damage.

## 2014-11-22 NOTE — Assessment & Plan Note (Signed)
He is also upset that the surgeon would not fix this and wants it fixed as he does not like the way it looks. Talked with him about the fact that it is not harmful and given the state of his heart and his sugars it would not be a good idea to proceed with any elective surgery at this time.

## 2014-11-23 ENCOUNTER — Ambulatory Visit (HOSPITAL_COMMUNITY): Payer: Self-pay

## 2014-11-23 ENCOUNTER — Telehealth (HOSPITAL_COMMUNITY): Payer: Self-pay | Admitting: Cardiac Rehabilitation

## 2014-11-23 ENCOUNTER — Encounter (HOSPITAL_COMMUNITY): Payer: Medicare Other

## 2014-11-23 NOTE — Telephone Encounter (Signed)
pc to pt to assess reason for continued absence from cardiac rehab.  Pt states he has had "too many lows" this week and has been unable to attend.  Pt expresses interest in participating in program and states he has upcoming scheduled  appointment with Dr. Everardo All to discuss insulin dosage.  However pt is unable to identify date and time of appointment.  Pt also states he frequently adjusts his insulin dosage to treat highs and lows.  pt instructed to check CBG 1 hour prior to exercise class time, if CBG >100 eat light snack (ie half sandwich, piece of fruit, granola bar, yogurt).   If CBG <100, do not plan to come to cardiac rehab.   Pt verbalized understanding.

## 2014-11-26 ENCOUNTER — Encounter (HOSPITAL_COMMUNITY): Payer: Medicare Other

## 2014-11-28 ENCOUNTER — Encounter (HOSPITAL_COMMUNITY): Payer: Medicare Other

## 2014-11-28 ENCOUNTER — Ambulatory Visit (HOSPITAL_COMMUNITY): Payer: Self-pay

## 2014-11-28 NOTE — Telephone Encounter (Signed)
Certified dismissal letter returned as undeliverable, unclaimed, return to sender after three attempts by [employer] on November 27, 2014. Letter placed in another envelope and resent as 1st class mail on November 28, 2014 which does not require a signature. DJC

## 2014-11-29 ENCOUNTER — Encounter: Payer: Self-pay | Admitting: Interventional Cardiology

## 2014-11-30 ENCOUNTER — Encounter (HOSPITAL_COMMUNITY): Payer: Medicare Other

## 2014-11-30 ENCOUNTER — Ambulatory Visit (HOSPITAL_COMMUNITY): Payer: Self-pay

## 2014-11-30 ENCOUNTER — Telehealth (HOSPITAL_COMMUNITY): Payer: Self-pay | Admitting: Cardiac Rehabilitation

## 2014-11-30 NOTE — Telephone Encounter (Signed)
pc to pt to assess reason for continued absence from cardiac rehab. LM on vm.  

## 2014-12-03 ENCOUNTER — Encounter (HOSPITAL_COMMUNITY)
Admission: RE | Admit: 2014-12-03 | Discharge: 2014-12-03 | Disposition: A | Discharge status: Home / Self Care | Payer: Medicare Other | Source: Ambulatory Visit | Attending: Interventional Cardiology | Admitting: Interventional Cardiology

## 2014-12-03 ENCOUNTER — Encounter (HOSPITAL_COMMUNITY): Payer: Medicare Other

## 2014-12-03 DIAGNOSIS — Z7982 Long term (current) use of aspirin: Secondary | ICD-10-CM | POA: Insufficient documentation

## 2014-12-03 DIAGNOSIS — I10 Essential (primary) hypertension: Secondary | ICD-10-CM | POA: Diagnosis not present

## 2014-12-03 DIAGNOSIS — Z5189 Encounter for other specified aftercare: Secondary | ICD-10-CM | POA: Diagnosis present

## 2014-12-03 DIAGNOSIS — Q211 Atrial septal defect: Secondary | ICD-10-CM | POA: Insufficient documentation

## 2014-12-03 DIAGNOSIS — Z79891 Long term (current) use of opiate analgesic: Secondary | ICD-10-CM | POA: Insufficient documentation

## 2014-12-03 DIAGNOSIS — I2510 Atherosclerotic heart disease of native coronary artery without angina pectoris: Secondary | ICD-10-CM | POA: Insufficient documentation

## 2014-12-03 DIAGNOSIS — Z951 Presence of aortocoronary bypass graft: Secondary | ICD-10-CM | POA: Insufficient documentation

## 2014-12-03 DIAGNOSIS — I255 Ischemic cardiomyopathy: Secondary | ICD-10-CM | POA: Insufficient documentation

## 2014-12-03 DIAGNOSIS — I5022 Chronic systolic (congestive) heart failure: Secondary | ICD-10-CM | POA: Diagnosis not present

## 2014-12-03 DIAGNOSIS — F17210 Nicotine dependence, cigarettes, uncomplicated: Secondary | ICD-10-CM | POA: Insufficient documentation

## 2014-12-03 DIAGNOSIS — I2582 Chronic total occlusion of coronary artery: Secondary | ICD-10-CM | POA: Diagnosis not present

## 2014-12-03 DIAGNOSIS — Z794 Long term (current) use of insulin: Secondary | ICD-10-CM | POA: Insufficient documentation

## 2014-12-03 DIAGNOSIS — I252 Old myocardial infarction: Secondary | ICD-10-CM | POA: Diagnosis not present

## 2014-12-03 DIAGNOSIS — E039 Hypothyroidism, unspecified: Secondary | ICD-10-CM | POA: Diagnosis not present

## 2014-12-03 DIAGNOSIS — Z79899 Other long term (current) drug therapy: Secondary | ICD-10-CM | POA: Insufficient documentation

## 2014-12-03 DIAGNOSIS — E1300 Other specified diabetes mellitus with hyperosmolarity without nonketotic hyperglycemic-hyperosmolar coma (NKHHC): Secondary | ICD-10-CM | POA: Diagnosis not present

## 2014-12-03 DIAGNOSIS — E785 Hyperlipidemia, unspecified: Secondary | ICD-10-CM | POA: Insufficient documentation
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
## 2014-12-03 NOTE — Progress Notes (Signed)
Reviewed home exercise guidelines with patient including endpoints, temperature precautions, target heart rate and rate of perceived exertion. Pt is currently walking 15 minutes, 1-2 days per week as his mode of home exercise.Pt plans to join a public gym in [place]. He wants to exercise using equipment similar to the cardiac rehab.  Pt voices understanding of instructions given.   Debby Freiberg academic intern Artist Pais, MS, ACSM CCEP

## 2014-12-04 ENCOUNTER — Telehealth: Payer: Self-pay | Admitting: Endocrinology

## 2014-12-04 LAB — GLUCOSE, CAPILLARY
Glucose-Capillary: 165 mg/dL — ABNORMAL HIGH (ref 70–99)
Glucose-Capillary: 289 mg/dL — ABNORMAL HIGH (ref 70–99)

## 2014-12-04 NOTE — Telephone Encounter (Signed)
Pt is wanting to know why he was dismissed the letter doesn't say.

## 2014-12-05 ENCOUNTER — Encounter (HOSPITAL_COMMUNITY): Payer: Medicare Other

## 2014-12-05 ENCOUNTER — Ambulatory Visit (HOSPITAL_COMMUNITY): Payer: Self-pay

## 2014-12-05 ENCOUNTER — Telehealth: Payer: Self-pay | Admitting: Endocrinology

## 2014-12-05 ENCOUNTER — Encounter (HOSPITAL_COMMUNITY)
Admission: RE | Admit: 2014-12-05 | Discharge: 2014-12-05 | Disposition: A | Discharge status: Home / Self Care | Payer: Medicare Other | Source: Ambulatory Visit | Attending: Interventional Cardiology | Admitting: Interventional Cardiology

## 2014-12-05 DIAGNOSIS — Z5189 Encounter for other specified aftercare: Secondary | ICD-10-CM | POA: Diagnosis not present

## 2014-12-05 LAB — GLUCOSE, CAPILLARY
GLUCOSE-CAPILLARY: 282 mg/dL — AB (ref 70–99)
Glucose-Capillary: 227 mg/dL — ABNORMAL HIGH (ref 70–99)

## 2014-12-05 NOTE — Telephone Encounter (Signed)
Patient would like to know why he was dismissed. Please advise

## 2014-12-06 NOTE — Telephone Encounter (Signed)
Can Dr. Everardo All let me know why? I don't mind telling the pt but it doesn't say why in the letter.

## 2014-12-07 ENCOUNTER — Encounter (HOSPITAL_COMMUNITY): Payer: Medicare Other

## 2014-12-07 ENCOUNTER — Encounter (HOSPITAL_COMMUNITY)
Admission: RE | Admit: 2014-12-07 | Discharge: 2014-12-07 | Disposition: A | Discharge status: Home / Self Care | Payer: Medicare Other | Source: Ambulatory Visit | Attending: Interventional Cardiology | Admitting: Interventional Cardiology

## 2014-12-07 ENCOUNTER — Ambulatory Visit (HOSPITAL_COMMUNITY): Payer: Self-pay

## 2014-12-07 DIAGNOSIS — Z5189 Encounter for other specified aftercare: Secondary | ICD-10-CM | POA: Diagnosis not present

## 2014-12-07 LAB — GLUCOSE, CAPILLARY
Glucose-Capillary: 172 mg/dL — ABNORMAL HIGH (ref 70–99)
Glucose-Capillary: 241 mg/dL — ABNORMAL HIGH (ref 70–99)

## 2014-12-10 ENCOUNTER — Encounter (HOSPITAL_COMMUNITY): Payer: Medicare Other

## 2014-12-10 ENCOUNTER — Encounter (HOSPITAL_COMMUNITY)
Admission: RE | Admit: 2014-12-10 | Discharge: 2014-12-10 | Disposition: A | Discharge status: Home / Self Care | Payer: Medicare Other | Source: Ambulatory Visit | Attending: Interventional Cardiology | Admitting: Interventional Cardiology

## 2014-12-10 DIAGNOSIS — Z5189 Encounter for other specified aftercare: Secondary | ICD-10-CM | POA: Diagnosis not present

## 2014-12-10 NOTE — Progress Notes (Signed)
Pt self adjusted workload level to 17.0 on arm crank at cardiac rehab.  RPE-15. Pt states he enjoys working hard and feels this level is the only one that gives him adequate workout.  Pt educated about importance of slow steady increase and talk with staff prior making workload increases to verify heart rate and blood pressure stable to tolerate work load increase.  Understanding verbalized

## 2014-12-12 ENCOUNTER — Ambulatory Visit (HOSPITAL_COMMUNITY): Payer: Self-pay

## 2014-12-12 ENCOUNTER — Encounter (HOSPITAL_COMMUNITY): Payer: Medicare Other

## 2014-12-12 ENCOUNTER — Encounter (HOSPITAL_COMMUNITY)
Admission: RE | Admit: 2014-12-12 | Discharge: 2014-12-12 | Disposition: A | Discharge status: Home / Self Care | Payer: Medicare Other | Source: Ambulatory Visit | Attending: Interventional Cardiology | Admitting: Interventional Cardiology

## 2014-12-12 DIAGNOSIS — Z5189 Encounter for other specified aftercare: Secondary | ICD-10-CM | POA: Diagnosis not present

## 2014-12-14 ENCOUNTER — Encounter (HOSPITAL_COMMUNITY): Payer: Medicare Other

## 2014-12-14 ENCOUNTER — Encounter (HOSPITAL_COMMUNITY)
Admission: RE | Admit: 2014-12-14 | Discharge: 2014-12-14 | Disposition: A | Discharge status: Home / Self Care | Payer: Medicare Other | Source: Ambulatory Visit | Attending: Interventional Cardiology | Admitting: Interventional Cardiology

## 2014-12-14 ENCOUNTER — Ambulatory Visit (HOSPITAL_COMMUNITY): Payer: Self-pay

## 2014-12-14 DIAGNOSIS — Z5189 Encounter for other specified aftercare: Secondary | ICD-10-CM | POA: Diagnosis not present

## 2014-12-17 ENCOUNTER — Encounter (HOSPITAL_COMMUNITY): Payer: Medicare Other

## 2014-12-19 ENCOUNTER — Encounter (HOSPITAL_COMMUNITY)
Admission: RE | Admit: 2014-12-19 | Discharge: 2014-12-19 | Disposition: A | Discharge status: Home / Self Care | Payer: Medicare Other | Source: Ambulatory Visit | Attending: Interventional Cardiology | Admitting: Interventional Cardiology

## 2014-12-19 ENCOUNTER — Encounter (HOSPITAL_COMMUNITY): Payer: Medicare Other

## 2014-12-19 ENCOUNTER — Ambulatory Visit (HOSPITAL_COMMUNITY): Payer: Self-pay

## 2014-12-19 DIAGNOSIS — Z5189 Encounter for other specified aftercare: Secondary | ICD-10-CM | POA: Diagnosis not present

## 2014-12-21 ENCOUNTER — Encounter (HOSPITAL_COMMUNITY): Payer: Medicare Other

## 2014-12-21 ENCOUNTER — Ambulatory Visit (HOSPITAL_COMMUNITY): Payer: Self-pay

## 2014-12-21 ENCOUNTER — Encounter (HOSPITAL_COMMUNITY)
Admission: RE | Admit: 2014-12-21 | Discharge: 2014-12-21 | Disposition: A | Discharge status: Home / Self Care | Payer: Medicare Other | Source: Ambulatory Visit | Attending: Interventional Cardiology | Admitting: Interventional Cardiology

## 2014-12-21 DIAGNOSIS — Z5189 Encounter for other specified aftercare: Secondary | ICD-10-CM | POA: Diagnosis not present

## 2014-12-24 ENCOUNTER — Encounter: Payer: Self-pay | Admitting: Geriatric Medicine

## 2014-12-24 ENCOUNTER — Encounter (HOSPITAL_COMMUNITY): Payer: Medicare Other

## 2014-12-24 ENCOUNTER — Telehealth: Payer: Self-pay | Admitting: Geriatric Medicine

## 2014-12-24 NOTE — Telephone Encounter (Signed)
Left patient a message regarding his endo referral. He has been dismissed from all [place] practices. We have sent him to [facility] and they should be contacting him with an appointment. If he does not hear from anyone in the next couple of days, he can call [phone] to follow up.

## 2014-12-26 ENCOUNTER — Encounter (HOSPITAL_COMMUNITY): Payer: Medicare Other

## 2014-12-26 ENCOUNTER — Ambulatory Visit (HOSPITAL_COMMUNITY): Payer: Self-pay

## 2014-12-28 ENCOUNTER — Ambulatory Visit (HOSPITAL_COMMUNITY): Payer: Self-pay

## 2014-12-28 ENCOUNTER — Encounter (HOSPITAL_COMMUNITY): Payer: Medicare Other

## 2014-12-28 NOTE — Telephone Encounter (Signed)
Send him to [facility], or [facility] [place], or [place], per Dr. Dorise Hiss.

## 2014-12-28 NOTE — Telephone Encounter (Signed)
Patient has been dismissed from [facility] as well.

## 2014-12-31 ENCOUNTER — Encounter (HOSPITAL_COMMUNITY)
Admission: RE | Admit: 2014-12-31 | Discharge: 2014-12-31 | Disposition: A | Discharge status: Home / Self Care | Payer: Medicare Other | Source: Ambulatory Visit | Attending: Interventional Cardiology | Admitting: Interventional Cardiology

## 2014-12-31 ENCOUNTER — Encounter (HOSPITAL_COMMUNITY): Payer: Medicare Other

## 2014-12-31 DIAGNOSIS — I10 Essential (primary) hypertension: Secondary | ICD-10-CM | POA: Insufficient documentation

## 2014-12-31 DIAGNOSIS — I255 Ischemic cardiomyopathy: Secondary | ICD-10-CM | POA: Diagnosis not present

## 2014-12-31 DIAGNOSIS — Z7982 Long term (current) use of aspirin: Secondary | ICD-10-CM | POA: Insufficient documentation

## 2014-12-31 DIAGNOSIS — I2582 Chronic total occlusion of coronary artery: Secondary | ICD-10-CM | POA: Insufficient documentation

## 2014-12-31 DIAGNOSIS — Z951 Presence of aortocoronary bypass graft: Secondary | ICD-10-CM | POA: Insufficient documentation

## 2014-12-31 DIAGNOSIS — Z79899 Other long term (current) drug therapy: Secondary | ICD-10-CM | POA: Insufficient documentation

## 2014-12-31 DIAGNOSIS — E039 Hypothyroidism, unspecified: Secondary | ICD-10-CM | POA: Insufficient documentation

## 2014-12-31 DIAGNOSIS — I252 Old myocardial infarction: Secondary | ICD-10-CM | POA: Diagnosis not present

## 2014-12-31 DIAGNOSIS — E1300 Other specified diabetes mellitus with hyperosmolarity without nonketotic hyperglycemic-hyperosmolar coma (NKHHC): Secondary | ICD-10-CM | POA: Diagnosis not present

## 2014-12-31 DIAGNOSIS — E785 Hyperlipidemia, unspecified: Secondary | ICD-10-CM | POA: Diagnosis not present

## 2014-12-31 DIAGNOSIS — Z5189 Encounter for other specified aftercare: Secondary | ICD-10-CM | POA: Diagnosis present

## 2014-12-31 DIAGNOSIS — I5022 Chronic systolic (congestive) heart failure: Secondary | ICD-10-CM | POA: Diagnosis not present

## 2014-12-31 DIAGNOSIS — F17210 Nicotine dependence, cigarettes, uncomplicated: Secondary | ICD-10-CM | POA: Insufficient documentation

## 2014-12-31 DIAGNOSIS — I2510 Atherosclerotic heart disease of native coronary artery without angina pectoris: Secondary | ICD-10-CM | POA: Diagnosis not present

## 2014-12-31 DIAGNOSIS — Z79891 Long term (current) use of opiate analgesic: Secondary | ICD-10-CM | POA: Diagnosis not present

## 2014-12-31 DIAGNOSIS — Z794 Long term (current) use of insulin: Secondary | ICD-10-CM | POA: Diagnosis not present

## 2014-12-31 DIAGNOSIS — Q211 Atrial septal defect: Secondary | ICD-10-CM | POA: Insufficient documentation

## 2015-01-01 ENCOUNTER — Ambulatory Visit (INDEPENDENT_AMBULATORY_CARE_PROVIDER_SITE_OTHER): Payer: Medicare Other | Admitting: Interventional Cardiology

## 2015-01-01 ENCOUNTER — Encounter: Payer: Self-pay | Admitting: Interventional Cardiology

## 2015-01-01 VITALS — BP 130/60 | HR 44 | Ht 67.0 in | Wt 187.8 lb

## 2015-01-01 DIAGNOSIS — F17200 Nicotine dependence, unspecified, uncomplicated: Secondary | ICD-10-CM

## 2015-01-01 DIAGNOSIS — I5022 Chronic systolic (congestive) heart failure: Secondary | ICD-10-CM

## 2015-01-01 DIAGNOSIS — Z720 Tobacco use: Secondary | ICD-10-CM | POA: Diagnosis not present

## 2015-01-01 DIAGNOSIS — Z951 Presence of aortocoronary bypass graft: Secondary | ICD-10-CM | POA: Diagnosis not present

## 2015-01-01 DIAGNOSIS — G4733 Obstructive sleep apnea (adult) (pediatric): Secondary | ICD-10-CM

## 2015-01-01 DIAGNOSIS — E1029 Type 1 diabetes mellitus with other diabetic kidney complication: Secondary | ICD-10-CM

## 2015-01-01 DIAGNOSIS — I255 Ischemic cardiomyopathy: Secondary | ICD-10-CM

## 2015-01-01 DIAGNOSIS — N529 Male erectile dysfunction, unspecified: Secondary | ICD-10-CM

## 2015-01-01 MED ORDER — SILDENAFIL CITRATE 100 MG PO TABS: 100.0000 mg | ORAL_TABLET | Freq: Every day | ORAL | Status: AC | PRN

## 2015-01-01 NOTE — Patient Instructions (Signed)
Medication Instructions:  START Viagra as needed.DO NOT USE WITHIN 24HRS OF TAKING NITRATES FOR CHEST PAIN Take all other medications as prescribed   Labwork: None   Testing/Procedures: None   Follow-Up: Your physician wants you to follow-up in: 6 months with Dr.Smith You will receive a reminder letter in the mail two months in advance. If you don't receive a letter, please call our office to schedule the follow-up appointment.   Any Other Special Instructions Will Be Listed Below (If Applicable). You have been referred to a Nutrionist

## 2015-01-01 NOTE — Progress Notes (Signed)
Cardiology Office Note   Date:  01/01/2015   ID:  Reginald Gutierrez, DOB 29-May-1966, MRN [id number]  PCP:  Judie Bonus, MD  Cardiologist:   [name], MD   Chief Complaint  Patient presents with  . Congestive Heart Failure      History of Present Illness: Reginald Gutierrez is a 49 y.o. male who presents for CAD, multivessel coronary bypass grafting, systolic heart failure with improved EF to 40-45% post surgery, psychiatric illness, type 1 diabetes mellitus, hyperlipidemia.  He is accompanied by his mother. They have been bickering back and forth. He states his breathing is improved. He has not had angina. He is resumed cigarette smoking. He also PDE 5 inhibitor therapy. He did not want to talk about it in the presence of his mother.  Past Medical History  Diagnosis Date  . Diabetes mellitus type 1   . Hyperlipidemia   . Depression   . Umbilical hernia   . Hypertension   . Glaucoma   . CHF (congestive heart failure)   . Hypothyroidism   . Hypoglycemia, unspecified   . Disorder of bone and cartilage, unspecified   . Acute posthemorrhagic anemia   . Anxiety state, unspecified   . Chronic pain syndrome   . Acute respiratory failure   . Dysphagia, oral phase   . Stress fracture of tibia or fibula   . Syncope and collapse   . Coronary artery disease 07/19/2012  . Ischemic cardiomyopathy   . Narcotic dependency, continuous   . Heart attack 12/16/11  . NSTEMI (non-ST elevated myocardial infarction) 12/19/2011; 07/03/2014    ; Hattie Perch 07/03/2014  . 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 Appendectomy      open  . Tibia im nail insertion  12/18/2011    Procedure: INTRAMEDULLARY (IM) NAIL TIBIAL;  Surgeon: Eugenia Mcalpine, MD;  Location: [facility] ORS;  Service: Orthopedics;  Laterality: Bilateral;  . Insertion of dialysis catheter  12/31/2011    Procedure: INSERTION OF DIALYSIS CATHETER;  Surgeon: Chuck Hint, MD;  Location: [facility] OR;  Service: Vascular;  Laterality: N/A;; "while in ICU; not permanent  . Tonsillectomy  1980  . Fracture surgery  2012    wrist  . Fracture surgery  2013    bilateral tibia  . Cardiac catheterization  07/2012; 01/2014  . Coronary artery bypass graft N/A 07/06/2014    Procedure: CORONARY ARTERY BYPASS GRAFTING (CABG) x4 using left internal mammary artery and bilateral thigh greater saphenous veins;  Surgeon: Purcell Nails, MD;  Location: [facility] OR;  Service: Open Heart Surgery;  Laterality: N/A;  . Intraoperative transesophageal echocardiogram N/A 07/06/2014    Procedure: INTRAOPERATIVE TRANSESOPHAGEAL ECHOCARDIOGRAM;  Surgeon: Purcell Nails, MD;  Location: [facility] OR;  Service: Open Heart Surgery;  Laterality: N/A;  . Patent foramen ovale closure N/A 07/06/2014    Procedure: PATENT FORAMEN OVALE CLOSURE;  Surgeon: Purcell Nails, MD;  Location: [facility] OR;  Service: Open Heart Surgery;  Laterality: N/A;  . Left heart catheterization with coronary angiogram N/A 07/22/2012    Procedure: LEFT HEART CATHETERIZATION WITH CORONARY ANGIOGRAM;  Surgeon: Peter M Swaziland, MD;  Location: [facility] CATH LAB;  Service: Cardiovascular;  Laterality: N/A;  . Left heart catheterization with coronary angiogram N/A 02/13/2014    Procedure: LEFT HEART  CATHETERIZATION WITH CORONARY ANGIOGRAM;  Surgeon: [name], MD;  Location: [facility] CATH LAB;  Service: Cardiovascular;  Laterality: N/A;  . Left heart catheterization with coronary angiogram N/A 07/04/2014    Procedure: LEFT HEART CATHETERIZATION WITH CORONARY ANGIOGRAM;  Surgeon: [name], MD;  Location: [facility] CATH LAB;  Service: Cardiovascular;  Laterality: N/A;     Current Outpatient Prescriptions  Medication Sig Dispense Refill  . ACCU-CHEK AVIVA PLUS test strip TEST 6 TIMES A DAY AS DIRECTED. 150 each 12  . ARIPiprazole (ABILIFY) 5 MG tablet Take 2.5 mg by mouth 2 (two) times  daily.    Marland Kitchen aspirin EC 81 MG tablet Take 81 mg by mouth daily.    Marland Kitchen atorvastatin (LIPITOR) 40 MG tablet take 1 tablet by mouth once daily 30 tablet 3  . clonazePAM (KLONOPIN) 1 MG tablet Take 1 mg by mouth daily as needed for anxiety. FOR ANXIETY AND SLEEP  0  . DULoxetine 40 MG CPEP Take 40 mg by mouth daily. 30 capsule 0  . ferrous sulfate 325 (65 FE) MG tablet Take 325 mg by mouth daily with breakfast.    . glucagon (GLUCAGON EMERGENCY) 1 MG injection Inject 1 mg into the muscle once as needed (for low blood sugar). 1 each 2  . glucose blood test strip TEST BLOOD SUGAR 6 TIMES A DAY AS INSTRUCTED BY DR ELLISON 200 each 10  . insulin glargine (LANTUS) 100 UNIT/ML injection Inject 15 Units into the skin at bedtime. Inject 15 units into skin in the morning and 15 units into skin in the evening    . insulin regular (NOVOLIN R,HUMULIN R) 100 units/mL injection Inject 2-8 Units into the skin See admin instructions. Sliding scale.   Give 2-8 units four times a day    . INSULIN SYRINGE .5CC/29G 29G X 1/2" 0.5 ML MISC 1 Syringe by Does not apply route daily. 30 each 11  . Insulin Syringe-Needle U-100 (B-D INS SYRINGE 0.5CC/30GX1/2") 30G X 1/2" 0.5 ML MISC Use 3-4 times per day 300 each 2  . levothyroxine (SYNTHROID, LEVOTHROID) 125 MCG tablet Take 1 tablet (125 mcg total) by mouth daily before breakfast. 30 tablet 2  . lidocaine (LIDODERM) 5 % Place 1 patch onto the skin daily. Remove & Discard patch within 12 hours or as directed by MD 30 patch 0  . lisinopril (PRINIVIL,ZESTRIL) 40 MG tablet Take 40 mg by mouth daily. TAKES 1/2 TABLET EVERY DAY  0  . metoprolol tartrate (LOPRESSOR) 25 MG tablet Take 0.5 tablets (12.5 mg total) by mouth 2 (two) times daily. 30 tablet 1  . Multiple Vitamin (MULTIVITAMIN WITH MINERALS) TABS tablet Take 1 tablet by mouth daily. Diabetes daily pak from [employer]    . sildenafil (VIAGRA) 100 MG tablet Take 1 tablet (100 mg total) by mouth daily as needed for erectile  dysfunction. 10 tablet 2   No current facility-administered medications for this visit.    Allergies:   Review of patient's allergies indicates no known allergies.    Social History:  The patient  reports that he has been smoking Cigarettes.  He has been smoking about 1.00 pack per day. He has never used smokeless tobacco. He reports that he does not drink alcohol or use illicit drugs.   Family History:  The patient's family history includes Heart disease in his father.    ROS:  Please see the history of present illness.   Otherwise, review of systems are positive for recurrent smoking, anger, friction  with his mother, erectile dysfunction, and sedentary lifestyle..   All other systems are reviewed and negative.    PHYSICAL EXAM: VS:  BP 130/60 mmHg  Pulse 44  Ht 5\' 7"  (1.702 m)  Wt 187 lb 12.8 oz (85.186 kg)  BMI 29.41 kg/m2  SpO2 96% , BMI Body mass index is 29.41 kg/(m^2). GEN: Well nourished, well developed, in no acute distress HEENT: normal Neck: no JVD, carotid bruits, or masses Cardiac: RRR; no murmurs, rubs, or gallops,no edema  Respiratory:  clear to auscultation bilaterally, normal work of breathing GI: soft, nontender, nondistended, + BS MS: no deformity or atrophy Skin: warm and dry, no rash Neuro:  Strength and sensation are intact Psych: euthymic mood, full affect   EKG:  EKG is not ordered today.    Recent Labs: 07/03/2014: TSH 16.480* 07/07/2014: Magnesium 2.1 07/10/2014: Hemoglobin 8.1*; Platelets 248 11/20/2014: ALT 26; BUN 16; Creatinine 0.91; Potassium 4.4; Pro B Natriuretic peptide (BNP) 211.0*; Sodium 138    Lipid Panel    Component Value Date/Time   CHOL 147 07/05/2014 1342   CHOL 225* 08/15/2013 1454   TRIG 128 07/05/2014 1342   HDL 52 07/05/2014 1342   HDL 62 08/15/2013 1454   CHOLHDL 2.8 07/05/2014 1342   CHOLHDL 3.6 08/15/2013 1454   VLDL 26 07/05/2014 1342   LDLCALC 69 07/05/2014 1342   LDLCALC 143* 08/15/2013 1454      Wt  Readings from Last 3 Encounters:  01/01/15 187 lb 12.8 oz (85.186 kg)  11/20/14 191 lb 12.8 oz (87 kg)  10/18/14 188 lb 0.8 oz (85.3 kg)      Other studies Reviewed: Additional studies/ records that were reviewed today include: . None other than laboratory data recently performed at his primary care. This was a beam at that was normal.   ASSESSMENT AND PLAN:  Erectile dysfunction, unspecified erectile dysfunction type - Plan: sildenafil (VIAGRA) 100 MG tablet  Chronic systolic heart failure: LV systolic function by echo performed after surgery was 40-45%. He is asymptomatic.  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  The patient has concerns regarding medicines.  The following changes have been made:  Turned about erectile dysfunction. We have prescribed sildenafil  Labs/ tests ordered today include:   Orders Placed This Encounter  Procedures  . Ambulatory referral to Nutrition and Diabetic Education     Disposition:   FU with HS in 1 year  Signed, [name], MD  01/01/2015 9:40 AM    [facility] [address] [place], [place], [place]  [address] Phone: [phone]; Fax: [phone]

## 2015-01-02 ENCOUNTER — Encounter (HOSPITAL_COMMUNITY): Payer: Medicare Other

## 2015-01-02 ENCOUNTER — Encounter (HOSPITAL_COMMUNITY)
Admission: RE | Admit: 2015-01-02 | Discharge: 2015-01-02 | Disposition: A | Discharge status: Home / Self Care | Payer: Medicare Other | Source: Ambulatory Visit | Attending: Interventional Cardiology | Admitting: Interventional Cardiology

## 2015-01-02 ENCOUNTER — Ambulatory Visit (HOSPITAL_COMMUNITY): Payer: Self-pay

## 2015-01-02 DIAGNOSIS — Z5189 Encounter for other specified aftercare: Secondary | ICD-10-CM | POA: Diagnosis not present

## 2015-01-04 ENCOUNTER — Encounter (HOSPITAL_COMMUNITY): Payer: Medicare Other

## 2015-01-04 ENCOUNTER — Encounter (HOSPITAL_COMMUNITY)
Admission: RE | Admit: 2015-01-04 | Discharge: 2015-01-04 | Disposition: A | Discharge status: Home / Self Care | Payer: Medicare Other | Source: Ambulatory Visit | Attending: Interventional Cardiology | Admitting: Interventional Cardiology

## 2015-01-04 ENCOUNTER — Ambulatory Visit (HOSPITAL_COMMUNITY): Payer: Self-pay

## 2015-01-04 DIAGNOSIS — Z5189 Encounter for other specified aftercare: Secondary | ICD-10-CM | POA: Diagnosis not present

## 2015-01-04 LAB — GLUCOSE, CAPILLARY
GLUCOSE-CAPILLARY: 47 mg/dL — AB (ref 70–99)
GLUCOSE-CAPILLARY: 75 mg/dL (ref 70–99)
Glucose-Capillary: 65 mg/dL — ABNORMAL LOW (ref 70–99)
Glucose-Capillary: 78 mg/dL (ref 70–99)

## 2015-01-04 NOTE — Progress Notes (Signed)
Pt had episode of hypoglycemia post exercise at cardiac rehab today.  Post exercise CBG-86 down from 254 pre exercise.  Pt given lemonade.  15 minute recheck CBG-46. Pt asymptomatic.    Pt refused glucose gel for fear of potentially causing extreme hyperglycemia.  Pt given gingerale and banana.  20 minute recheck CBG-65.  Pt given another gingerale.  20 minute recheck CBG-73.  With another recheck CBG-78.  Pt asymptomatic. Pt plans to eat meal immediately upon leaving cardiac rehab.  Pt and pt mother given verbal instructions to treat hypoglycemic episodes with carbohydrates, wait until CBG >90 to give protein. PC to Dr. Aundria Mems office to notify of incident.  Understanding verbalized.    Pt admits to having severe hypoglycemia CBG-26 this am.  Pt gave himself glucose gel and sweets to bring sugar up.  Pt states this has been a "bad week of ups and downs".   Pt reports he ate light lunch of eggs and 6 peanut butter crackers.  CBG -320.  He gave himself regular insulin 9 units to bring sugar below 300 to be able to exercise at cardiac rehab.  Pt CBG upon arrival at cardiac rehab was 254.  Pt revealed this information after he had exercised and his CBG dropped.  Pt given verbal information about dangers of hypoglycemia, to avoid over treating hyperglycemia and desired goal of stabalizing his blood sugar to avoid extreme highs and lows.  Pt reports he has upcoming endocrinology appointment 01/08/15 however unsure of physician name.  Pt verbalizes desire to improve his habits to control his blood sugar and develop exercise routine. Pt and pt mother verbalize understanding of necessity of safely maintaining blood sugar regimen, portion and balanced meal planning to prevent high and low episodes. Understanding verbalized.

## 2015-01-07 ENCOUNTER — Encounter (HOSPITAL_COMMUNITY)
Admission: RE | Admit: 2015-01-07 | Discharge: 2015-01-07 | Disposition: A | Discharge status: Home / Self Care | Payer: Medicare Other | Source: Ambulatory Visit | Attending: Interventional Cardiology | Admitting: Interventional Cardiology

## 2015-01-07 ENCOUNTER — Telehealth (HOSPITAL_COMMUNITY): Payer: Self-pay | Admitting: Cardiac Rehabilitation

## 2015-01-07 ENCOUNTER — Other Ambulatory Visit: Payer: Self-pay | Admitting: Internal Medicine

## 2015-01-07 ENCOUNTER — Encounter (HOSPITAL_COMMUNITY): Payer: Medicare Other

## 2015-01-07 DIAGNOSIS — Z5189 Encounter for other specified aftercare: Secondary | ICD-10-CM | POA: Diagnosis not present

## 2015-01-07 LAB — GLUCOSE, CAPILLARY
GLUCOSE-CAPILLARY: 199 mg/dL — AB (ref 70–99)
Glucose-Capillary: 232 mg/dL — ABNORMAL HIGH (ref 70–99)

## 2015-01-07 NOTE — Telephone Encounter (Signed)
-----   Message from Judie Bonus, MD sent at 01/07/2015 12:04 PM EDT ----- Regarding: RE: cardiac rehab hypoglycemia Thanks for the information and if he wants to schedule with me to discuss sooner that would be fine! Dr. Dorise Hiss ----- Message -----    From: Robyne Peers, RN    Sent: 01/04/2015   5:03 PM      To: Judie Bonus, MD Subject: cardiac rehab hypoglycemia                     Dear Dr. Dorise Hiss,  I wanted to report pt had episode of hypoglycemia while here at cardiac rehab today.  Post exercise CBG-46.  Pt asymptomatic. After pt given 16 oz gingerale, and banana ultimately came up to 78.  Pt reports he had hypoglycemia at home this morning, CBG-26, which he over treated with glucose gel and sweets.  This brought his blood sugar up to 320.  Pt ate light lunch, egg and peanut butter crackers.  He then gave himself regular insulin 9 units to bring it below 300 so he could exercise at cardiac rehab. Pre exercise CBG-254 however pt did not inform staff of the events of his morning prior to exercise.   Pt cautioned against the over treatment for both highs and lows and advised to hold regular insulin prior to exercise at cardiac rehab.  Please advise.   Pt does report he has appt to establish care with endocrinologist next week.  Pt also verbalizes desire to gain better control of his blood sugars.  Pt mother also reports he has upcoming nutritionist appt.  Pt and his mother were both counseled on importance of properly following diabetic regimen and safety concerns of self medicating to chase high and low blood sugars.     Thank you, Deveron Furlong, RN, BSN Cardiac Pulmonary Rehab

## 2015-01-08 ENCOUNTER — Other Ambulatory Visit: Payer: Self-pay | Admitting: Internal Medicine

## 2015-01-08 DIAGNOSIS — E0822 Diabetes mellitus due to underlying condition with diabetic chronic kidney disease: Secondary | ICD-10-CM

## 2015-01-09 ENCOUNTER — Ambulatory Visit (HOSPITAL_COMMUNITY): Payer: Self-pay

## 2015-01-09 ENCOUNTER — Encounter (HOSPITAL_COMMUNITY): Payer: Medicare Other

## 2015-01-09 ENCOUNTER — Encounter (HOSPITAL_COMMUNITY)
Admission: RE | Admit: 2015-01-09 | Discharge: 2015-01-09 | Disposition: A | Discharge status: Home / Self Care | Payer: Medicare Other | Source: Ambulatory Visit | Attending: Interventional Cardiology | Admitting: Interventional Cardiology

## 2015-01-09 NOTE — Progress Notes (Signed)
Pt hypoglycemic during exercise today at cardiac rehab.  Prior to exercise pt reported CBG-149 having eaten lunch and did NOT take regular insulin. Therefore pt allowed to exercise.  After 15 minutes, exercise pt checked CBG-67 with home meter.  Pt took personal glucose gel.  Pt also given gingerale.  20 minute recheck CBG-76.  Pt given lemonade.  15 minute recheck CBG-88.  Pt offered [place] sandwich from cafeteria. Pt declined, stating he preferred to go to cafeteria with his mother. Pt instructed to get meal from cafeteria prior to leaving for home.  Upon further questioning of events earlier today, pt admits to having fasting lab work drawn.  He then went to [facility] [employer] for breakfast and ate pancakes with syrup, hash browns, toast, eggs and ham with coffee.  CBG-389 at noon.  Therefore pt gave himself regular insulin -4 units, then ate crackers with cheese.  Pt reports when he rechecked cbg at 1:15 prior to cardiac rehab it was 149.  Pt instructed again to NOT take regular insulin prior to exercise, call cardiac rehab to report if CBG >300.  Pt counseled about importance of being forthcoming and truthful with medical staff about dietary habits and insulin dosage.  Pt again counseled about importance of avoiding over treatment of hyperglycemia which leads to dangers of hypoglycemia.  Pt reports he feels encouraged since his endocrinology appointment with Dr. Juleen China yesterday and has 1 week f/u appt next week to discuss lab results and regimen.  Pt accepting to offer for diabetes consultant referral.  Pt encouraged to attend diabetes Q&A education class Friday 01/11/2015 at cardiac rehab. Pt reports he will make plans to attend this class.  Pt verbalized understanding.

## 2015-01-11 ENCOUNTER — Ambulatory Visit (HOSPITAL_COMMUNITY): Payer: Self-pay

## 2015-01-11 ENCOUNTER — Encounter (HOSPITAL_COMMUNITY): Payer: Medicare Other

## 2015-01-13 ENCOUNTER — Inpatient Hospital Stay (HOSPITAL_COMMUNITY)
Admission: EM | Admit: 2015-01-13 | Discharge: 2015-01-30 | DRG: 638 | Disposition: E | Payer: Medicare Other | Attending: Internal Medicine | Admitting: Internal Medicine

## 2015-01-13 ENCOUNTER — Encounter (HOSPITAL_COMMUNITY): Payer: Self-pay | Admitting: Emergency Medicine

## 2015-01-13 ENCOUNTER — Emergency Department (HOSPITAL_COMMUNITY): Payer: Medicare Other

## 2015-01-13 DIAGNOSIS — E039 Hypothyroidism, unspecified: Secondary | ICD-10-CM | POA: Diagnosis present

## 2015-01-13 DIAGNOSIS — F329 Major depressive disorder, single episode, unspecified: Secondary | ICD-10-CM | POA: Diagnosis present

## 2015-01-13 DIAGNOSIS — S82892A Other fracture of left lower leg, initial encounter for closed fracture: Secondary | ICD-10-CM | POA: Diagnosis not present

## 2015-01-13 DIAGNOSIS — Z79899 Other long term (current) drug therapy: Secondary | ICD-10-CM | POA: Diagnosis not present

## 2015-01-13 DIAGNOSIS — E162 Hypoglycemia, unspecified: Secondary | ICD-10-CM | POA: Diagnosis not present

## 2015-01-13 DIAGNOSIS — R1311 Dysphagia, oral phase: Secondary | ICD-10-CM | POA: Diagnosis present

## 2015-01-13 DIAGNOSIS — I5022 Chronic systolic (congestive) heart failure: Secondary | ICD-10-CM | POA: Diagnosis present

## 2015-01-13 DIAGNOSIS — E1065 Type 1 diabetes mellitus with hyperglycemia: Secondary | ICD-10-CM | POA: Diagnosis not present

## 2015-01-13 DIAGNOSIS — I255 Ischemic cardiomyopathy: Secondary | ICD-10-CM

## 2015-01-13 DIAGNOSIS — E1029 Type 1 diabetes mellitus with other diabetic kidney complication: Secondary | ICD-10-CM | POA: Diagnosis present

## 2015-01-13 DIAGNOSIS — F17200 Nicotine dependence, unspecified, uncomplicated: Secondary | ICD-10-CM | POA: Diagnosis present

## 2015-01-13 DIAGNOSIS — F17210 Nicotine dependence, cigarettes, uncomplicated: Secondary | ICD-10-CM | POA: Diagnosis present

## 2015-01-13 DIAGNOSIS — E669 Obesity, unspecified: Secondary | ICD-10-CM | POA: Diagnosis present

## 2015-01-13 DIAGNOSIS — I10 Essential (primary) hypertension: Secondary | ICD-10-CM | POA: Diagnosis present

## 2015-01-13 DIAGNOSIS — G894 Chronic pain syndrome: Secondary | ICD-10-CM | POA: Diagnosis present

## 2015-01-13 DIAGNOSIS — Z7982 Long term (current) use of aspirin: Secondary | ICD-10-CM

## 2015-01-13 DIAGNOSIS — Z794 Long term (current) use of insulin: Secondary | ICD-10-CM

## 2015-01-13 DIAGNOSIS — I252 Old myocardial infarction: Secondary | ICD-10-CM | POA: Diagnosis not present

## 2015-01-13 DIAGNOSIS — S82853A Displaced trimalleolar fracture of unspecified lower leg, initial encounter for closed fracture: Secondary | ICD-10-CM | POA: Diagnosis present

## 2015-01-13 DIAGNOSIS — R55 Syncope and collapse: Secondary | ICD-10-CM | POA: Diagnosis present

## 2015-01-13 DIAGNOSIS — E785 Hyperlipidemia, unspecified: Secondary | ICD-10-CM | POA: Diagnosis present

## 2015-01-13 DIAGNOSIS — S82852A Displaced trimalleolar fracture of left lower leg, initial encounter for closed fracture: Secondary | ICD-10-CM | POA: Diagnosis present

## 2015-01-13 DIAGNOSIS — S82892S Other fracture of left lower leg, sequela: Secondary | ICD-10-CM

## 2015-01-13 DIAGNOSIS — Z8249 Family history of ischemic heart disease and other diseases of the circulatory system: Secondary | ICD-10-CM

## 2015-01-13 DIAGNOSIS — I469 Cardiac arrest, cause unspecified: Secondary | ICD-10-CM | POA: Diagnosis not present

## 2015-01-13 DIAGNOSIS — Z951 Presence of aortocoronary bypass graft: Secondary | ICD-10-CM | POA: Diagnosis not present

## 2015-01-13 DIAGNOSIS — I2510 Atherosclerotic heart disease of native coronary artery without angina pectoris: Secondary | ICD-10-CM | POA: Diagnosis present

## 2015-01-13 DIAGNOSIS — E10649 Type 1 diabetes mellitus with hypoglycemia without coma: Secondary | ICD-10-CM | POA: Diagnosis present

## 2015-01-13 DIAGNOSIS — Z720 Tobacco use: Secondary | ICD-10-CM

## 2015-01-13 DIAGNOSIS — E1021 Type 1 diabetes mellitus with diabetic nephropathy: Secondary | ICD-10-CM

## 2015-01-13 HISTORY — DX: Unspecified convulsions: R56.9

## 2015-01-13 LAB — CBG MONITORING, ED
GLUCOSE-CAPILLARY: 146 mg/dL — AB (ref 65–99)
GLUCOSE-CAPILLARY: 114 mg/dL — AB (ref 65–99)
Glucose-Capillary: 27 mg/dL — CL (ref 65–99)
Glucose-Capillary: 55 mg/dL — ABNORMAL LOW (ref 65–99)

## 2015-01-13 LAB — CBC WITH DIFFERENTIAL/PLATELET
BASOS PCT: 0 % (ref 0–1)
Basophils Absolute: 0 10*3/uL (ref 0.0–0.1)
EOS ABS: 0.1 10*3/uL (ref 0.0–0.7)
EOS PCT: 1 % (ref 0–5)
HCT: 38.4 % — ABNORMAL LOW (ref 39.0–52.0)
HEMOGLOBIN: 12 g/dL — AB (ref 13.0–17.0)
LYMPHS PCT: 10 % — AB (ref 12–46)
Lymphs Abs: 1.1 10*3/uL (ref 0.7–4.0)
MCH: 27.7 pg (ref 26.0–34.0)
MCHC: 31.3 g/dL (ref 30.0–36.0)
MCV: 88.7 fL (ref 78.0–100.0)
MONO ABS: 0.8 10*3/uL (ref 0.1–1.0)
MONOS PCT: 7 % (ref 3–12)
NEUTROS PCT: 82 % — AB (ref 43–77)
Neutro Abs: 8.7 10*3/uL — ABNORMAL HIGH (ref 1.7–7.7)
Platelets: 294 10*3/uL (ref 150–400)
RBC: 4.33 MIL/uL (ref 4.22–5.81)
RDW: 16.1 % — ABNORMAL HIGH (ref 11.5–15.5)
WBC: 10.7 10*3/uL — AB (ref 4.0–10.5)

## 2015-01-13 LAB — COMPREHENSIVE METABOLIC PANEL
ALK PHOS: 90 U/L (ref 38–126)
ALT: 15 U/L — ABNORMAL LOW (ref 17–63)
AST: 24 U/L (ref 15–41)
Albumin: 4.3 g/dL (ref 3.5–5.0)
Anion gap: 11 (ref 5–15)
BILIRUBIN TOTAL: 0.9 mg/dL (ref 0.3–1.2)
BUN: 24 mg/dL — ABNORMAL HIGH (ref 6–20)
CALCIUM: 9.1 mg/dL (ref 8.9–10.3)
CHLORIDE: 105 mmol/L (ref 101–111)
CO2: 26 mmol/L (ref 22–32)
Creatinine, Ser: 0.89 mg/dL (ref 0.61–1.24)
GLUCOSE: 158 mg/dL — AB (ref 65–99)
Potassium: 4.5 mmol/L (ref 3.5–5.1)
Sodium: 142 mmol/L (ref 135–145)
TOTAL PROTEIN: 7.7 g/dL (ref 6.5–8.1)

## 2015-01-13 LAB — URINALYSIS, ROUTINE W REFLEX MICROSCOPIC
Bilirubin Urine: NEGATIVE
GLUCOSE, UA: NEGATIVE mg/dL
HGB URINE DIPSTICK: NEGATIVE
KETONES UR: 15 mg/dL — AB
LEUKOCYTES UA: NEGATIVE
Nitrite: NEGATIVE
PROTEIN: 30 mg/dL — AB
Specific Gravity, Urine: 1.029 (ref 1.005–1.030)
UROBILINOGEN UA: 0.2 mg/dL (ref 0.0–1.0)
pH: 5 (ref 5.0–8.0)

## 2015-01-13 LAB — RAPID URINE DRUG SCREEN, HOSP PERFORMED
Amphetamines: NOT DETECTED
BARBITURATES: NOT DETECTED
Benzodiazepines: NOT DETECTED
COCAINE: NOT DETECTED
OPIATES: POSITIVE — AB
Tetrahydrocannabinol: NOT DETECTED

## 2015-01-13 LAB — GLUCOSE, CAPILLARY
GLUCOSE-CAPILLARY: 80 mg/dL (ref 65–99)
GLUCOSE-CAPILLARY: 141 mg/dL — AB (ref 65–99)
Glucose-Capillary: 32 mg/dL — CL (ref 65–99)
Glucose-Capillary: 143 mg/dL — ABNORMAL HIGH (ref 65–99)
Glucose-Capillary: 460 mg/dL — ABNORMAL HIGH (ref 65–99)

## 2015-01-13 LAB — TROPONIN I

## 2015-01-13 LAB — URINE MICROSCOPIC-ADD ON

## 2015-01-13 MED ORDER — METOPROLOL TARTRATE 12.5 MG HALF TABLET: 12.5000 mg | ORAL_TABLET | Freq: Two times a day (BID) | ORAL | Status: DC

## 2015-01-13 MED ORDER — DEXTROSE 50 % IV SOLN
INTRAVENOUS | Status: AC
  Administered 2015-01-13: 09:00:00
  Filled 2015-01-13: qty 50

## 2015-01-13 MED ORDER — NICOTINE 7 MG/24HR TD PT24: 7.0000 mg | MEDICATED_PATCH | Freq: Every day | TRANSDERMAL | Status: DC

## 2015-01-13 MED ORDER — HYDROMORPHONE HCL 1 MG/ML IJ SOLN
0.5000 mg | INTRAMUSCULAR | Status: DC | PRN
Start: 2015-01-13 — End: 2015-01-13
  Administered 2015-01-13: 1 mg via INTRAVENOUS

## 2015-01-13 MED ORDER — HYDROMORPHONE HCL 1 MG/ML IJ SOLN
INTRAMUSCULAR | Status: AC
  Filled 2015-01-13: qty 1

## 2015-01-13 MED ORDER — LISINOPRIL 20 MG PO TABS
20.0000 mg | ORAL_TABLET | Freq: Every day | ORAL | Status: DC
  Administered 2015-01-13: 20 mg via ORAL
  Filled 2015-01-13: qty 1

## 2015-01-13 MED ORDER — HYDRALAZINE HCL 20 MG/ML IJ SOLN: 2.0000 mg | Freq: Four times a day (QID) | INTRAMUSCULAR | Status: DC | PRN

## 2015-01-13 MED ORDER — ARIPIPRAZOLE 5 MG PO TABS: 2.5000 mg | ORAL_TABLET | Freq: Two times a day (BID) | ORAL | Status: DC

## 2015-01-13 MED ORDER — DEXTROSE 50 % IV SOLN
INTRAVENOUS | Status: AC
  Administered 2015-01-13: 50 mL
  Filled 2015-01-13: qty 50

## 2015-01-13 MED ORDER — HEPARIN SODIUM (PORCINE) 5000 UNIT/ML IJ SOLN: 5000.0000 [IU] | Freq: Three times a day (TID) | INTRAMUSCULAR | Status: DC

## 2015-01-13 MED ORDER — DULOXETINE HCL 20 MG PO CPEP
40.0000 mg | ORAL_CAPSULE | Freq: Every day | ORAL | Status: DC
  Filled 2015-01-13: qty 2

## 2015-01-13 MED ORDER — HYDROMORPHONE HCL 1 MG/ML IJ SOLN
1.0000 mg | INTRAMUSCULAR | Status: DC | PRN
  Administered 2015-01-13 (×3): 2 mg via INTRAVENOUS
  Filled 2015-01-13 (×3): qty 2

## 2015-01-13 MED ORDER — ASPIRIN EC 81 MG PO TBEC: 81.0000 mg | DELAYED_RELEASE_TABLET | Freq: Every day | ORAL | Status: DC

## 2015-01-13 MED ORDER — ACETAMINOPHEN 325 MG PO TABS: 650.0000 mg | ORAL_TABLET | Freq: Four times a day (QID) | ORAL | Status: DC | PRN

## 2015-01-13 MED ORDER — DEXTROSE-NACL 5-0.45 % IV SOLN
INTRAVENOUS | Status: DC
  Administered 2015-01-13: 10:00:00 via INTRAVENOUS

## 2015-01-13 MED ORDER — HYDROMORPHONE HCL 1 MG/ML IJ SOLN
0.5000 mg | Freq: Once | INTRAMUSCULAR | Status: AC
  Administered 2015-01-13: 0.5 mg via INTRAVENOUS
  Filled 2015-01-13: qty 1

## 2015-01-13 MED ORDER — HYDROMORPHONE HCL 1 MG/ML IJ SOLN
1.0000 mg | Freq: Once | INTRAMUSCULAR | Status: AC
  Administered 2015-01-13: 1 mg via INTRAVENOUS
  Filled 2015-01-13: qty 1

## 2015-01-13 MED ORDER — LEVOTHYROXINE SODIUM 125 MCG PO TABS: 125.0000 ug | ORAL_TABLET | Freq: Every day | ORAL | Status: DC

## 2015-01-13 MED ORDER — DEXTROSE 10 % IV SOLN
INTRAVENOUS | Status: DC
  Administered 2015-01-13: 15:00:00 via INTRAVENOUS

## 2015-01-13 MED ORDER — ONDANSETRON HCL 4 MG/2ML IJ SOLN
4.0000 mg | Freq: Once | INTRAMUSCULAR | Status: AC
  Administered 2015-01-13: 4 mg via INTRAVENOUS
  Filled 2015-01-13: qty 2

## 2015-01-13 MED ORDER — ATORVASTATIN CALCIUM 40 MG PO TABS
40.0000 mg | ORAL_TABLET | Freq: Every day | ORAL | Status: DC
  Administered 2015-01-13: 40 mg via ORAL
  Filled 2015-01-13: qty 1

## 2015-01-13 MED ORDER — DEXTROSE-NACL 5-0.45 % IV SOLN
INTRAVENOUS | Status: DC
  Administered 2015-01-13: 11:00:00 via INTRAVENOUS

## 2015-01-13 MED ORDER — CLONAZEPAM 0.5 MG PO TABS: 0.5000 mg | ORAL_TABLET | Freq: Every day | ORAL | Status: DC | PRN

## 2015-01-13 MED ORDER — ACETAMINOPHEN 650 MG RE SUPP: 650.0000 mg | Freq: Four times a day (QID) | RECTAL | Status: DC | PRN

## 2015-01-13 MED ORDER — HYDROMORPHONE HCL 1 MG/ML IJ SOLN
0.5000 mg | INTRAMUSCULAR | Status: DC | PRN
  Administered 2015-01-13 (×2): 0.5 mg via INTRAVENOUS
  Filled 2015-01-13 (×2): qty 1

## 2015-01-13 MED ORDER — ONDANSETRON HCL 4 MG PO TABS: 4.0000 mg | ORAL_TABLET | Freq: Four times a day (QID) | ORAL | Status: DC | PRN

## 2015-01-13 MED ORDER — NALOXONE HCL 0.4 MG/ML IJ SOLN
INTRAMUSCULAR | Status: AC
  Filled 2015-01-13: qty 1

## 2015-01-13 MED ORDER — ONDANSETRON HCL 4 MG/2ML IJ SOLN
4.0000 mg | Freq: Four times a day (QID) | INTRAMUSCULAR | Status: DC | PRN
  Administered 2015-01-13: 4 mg via INTRAVENOUS
  Filled 2015-01-13: qty 2

## 2015-01-14 ENCOUNTER — Encounter (HOSPITAL_COMMUNITY): Payer: Medicare Other

## 2015-01-14 LAB — CBG MONITORING, ED: Glucose-Capillary: 31 mg/dL — CL (ref 65–99)

## 2015-01-14 MED FILL — Medication: Qty: 1 | Status: AC

## 2015-01-14 NOTE — Discharge Summary (Signed)
Reginald Gutierrez, is a 49 y.o. male  DOB [birth year]  MRN [id number].  Admission date:  02/11/2015  Admitting Physician  Ron Parker, MD  Discharge Date:  01/14/2015   Primary MD  Judie Bonus, MD  Brief death summary Cause of death - Cardiopulmonary arrest Secondary to -Coronary artery disease   Admission Diagnosis  Trimalleolar fracture of ankle, closed, left, initial encounter [S82.852A] Syncope, unspecified syncope type [R55]   Discharge Diagnosis  Trimalleolar fracture of ankle, closed, left, initial encounter [S82.852A] Syncope, unspecified syncope type [R55]    Principal Problem:   Closed left ankle fracture Active Problems:   Hypothyroidism   HYPERTENSION, BENIGN ESSENTIAL   TOBACCO ABUSE   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 Diabetes mellitus type 1   . Hyperlipidemia   . Depression   . Umbilical hernia   . Hypertension   . Glaucoma   . CHF (congestive heart failure)   . Hypothyroidism   . Hypoglycemia, unspecified   . Disorder of bone and cartilage, unspecified   . Acute posthemorrhagic anemia   . Anxiety state, unspecified   . Chronic pain syndrome   . Acute respiratory failure   . Dysphagia, oral phase   . Stress fracture of tibia or fibula   . Syncope and collapse   . Coronary artery disease 07/19/2012  . Ischemic cardiomyopathy   . Narcotic dependency, continuous   . Heart attack 12/16/11  . NSTEMI (non-ST elevated myocardial infarction) 12/19/2011; 07/03/2014    ; Hattie Perch 07/03/2014  . 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 Seizures     Past Surgical History    Procedure Laterality Date  . Appendectomy      open  . Tibia im nail insertion  12/18/2011    Procedure: INTRAMEDULLARY (IM) NAIL TIBIAL;  Surgeon: Eugenia Mcalpine, MD;  Location: [facility] ORS;  Service: Orthopedics;  Laterality: Bilateral;  . Insertion of dialysis catheter  12/31/2011    Procedure: INSERTION OF DIALYSIS CATHETER;  Surgeon: Chuck Hint, MD;  Location: [facility] OR;  Service: Vascular;  Laterality: N/A;; "while in ICU; not permanent  . Tonsillectomy  1980  . Fracture surgery  2012    wrist  . Fracture surgery  2013    bilateral tibia  . Cardiac catheterization  07/2012; 01/2014  . Coronary artery bypass graft N/A 07/06/2014    Procedure: CORONARY ARTERY BYPASS GRAFTING (CABG) x4 using left internal mammary artery and bilateral thigh greater saphenous veins;  Surgeon: Purcell Nails, MD;  Location: [facility] OR;  Service: Open Heart Surgery;  Laterality: N/A;  . Intraoperative transesophageal echocardiogram N/A 07/06/2014    Procedure:  INTRAOPERATIVE TRANSESOPHAGEAL ECHOCARDIOGRAM;  Surgeon: Purcell Nails, MD;  Location: [facility] OR;  Service: Open Heart Surgery;  Laterality: N/A;  . Patent foramen ovale closure N/A 07/06/2014    Procedure: PATENT FORAMEN OVALE CLOSURE;  Surgeon: Purcell Nails, MD;  Location: [facility] OR;  Service: Open Heart Surgery;  Laterality: N/A;  . Left heart catheterization with coronary angiogram N/A 07/22/2012    Procedure: LEFT HEART CATHETERIZATION WITH CORONARY ANGIOGRAM;  Surgeon: Peter M Swaziland, MD;  Location: [facility] CATH LAB;  Service: Cardiovascular;  Laterality: N/A;  . Left heart catheterization with coronary angiogram N/A 02/13/2014    Procedure: LEFT HEART CATHETERIZATION WITH CORONARY ANGIOGRAM;  Surgeon: [name], MD;  Location: [facility] CATH LAB;  Service: Cardiovascular;  Laterality: N/A;  . Left heart catheterization with coronary angiogram N/A 07/04/2014    Procedure: LEFT HEART CATHETERIZATION WITH CORONARY ANGIOGRAM;  Surgeon: [name], MD;   Location: [facility] CATH LAB;  Service: Cardiovascular;  Laterality: N/A;       History of present illness and  Hospital Course:     Kindly see H&P for history of present illness and admission details, please review complete Labs, Consult reports and Test reports for all details in brief  HPI  from the history and physical done on the day of admission  Reginald Gutierrez is a 49 y.o. male with a history of Uncontrolled DM-1, CAD S/P CABGx 4 11/2015m Chronic systolic CHF, HTN, Hyperlipidemia, and Hypothroidism who was walking into his kitchen around 3 AM and passed out and fell onto the floor. He reports when he became conscious he checked his blood sugar and it was 41, and he had increased left ankle pain. He denies having any chest pain or SOB associated with the episode. He was brought to the ED and X-rays revealed a Trimalleolar Fracture of the Left ankle. The EDP consulted Orthopedics Dr, Victorino Dike and arrangements were made to transfer patient to [facility]. He Reports having a similar episode 2 years ago when he passed out due to hypoglycemia and broke both of his legs.  Hospital Course  Patient was transferred to [facility] to be evaluated by orthopedic team, patient was seen by cardiology at [facility] ED, and cleared for surgery, upon presentation to [facility] had complaints of significant severe left lower extremity pain secondary to fracture or pain medication were increased to control his pain , as well patient was noticed to have recurrent hypoglycemia on D5 half-normal saline, so fluids were changed to D10 at 75 mL/h, with Accu-Chek every 2 hours,  As per documentation: CODE BLUE was called around 2020, as patient was found without respirations or pulse, sugar that time was 460, CPR was started, NP discussed with family over the phone regarding CODE STATUS, mother and sister were asked about continuing CPR during the code over the phone, and both confirmed the  termination of CL is, as they stated patient had a very poor quality of life, CABG by age 26, diabetes mellitus and renal failure, sister is R and at [employer] and she understood stopping CPR will result in patient's death, they elected no autopsy.   Randol Kern, Keyvon Herter M.D on 01/14/2015 at 4:16 PM  [facility]   Office  [phone]

## 2015-01-14 NOTE — Progress Notes (Signed)
At 2020 RN J. Christell Constant called me to come to rm 27 because pt was not breathing. Pt assessed. No pulse and no respirations. Body was still warm. Face was yellow. CPR started immediately and Code Blue called. Code team arrived and CPR duration was 20 minutes. Pt's sister was notified during the code and after 20 minutes she decided to stop the code.

## 2015-01-16 ENCOUNTER — Encounter (HOSPITAL_COMMUNITY): Payer: Medicare Other

## 2015-01-16 ENCOUNTER — Ambulatory Visit (HOSPITAL_COMMUNITY): Payer: Self-pay

## 2015-01-18 ENCOUNTER — Encounter (HOSPITAL_COMMUNITY): Payer: Medicare Other

## 2015-01-18 ENCOUNTER — Ambulatory Visit (HOSPITAL_COMMUNITY): Payer: Self-pay

## 2015-01-21 ENCOUNTER — Encounter (HOSPITAL_COMMUNITY): Payer: Medicare Other

## 2015-01-23 ENCOUNTER — Encounter (HOSPITAL_COMMUNITY): Payer: Medicare Other

## 2015-01-23 ENCOUNTER — Ambulatory Visit (HOSPITAL_COMMUNITY): Payer: Self-pay

## 2015-01-25 ENCOUNTER — Ambulatory Visit (HOSPITAL_COMMUNITY): Payer: Self-pay

## 2015-01-25 ENCOUNTER — Encounter (HOSPITAL_COMMUNITY): Payer: Medicare Other

## 2015-01-28 ENCOUNTER — Encounter (HOSPITAL_COMMUNITY): Payer: Medicare Other

## 2015-01-30 ENCOUNTER — Encounter (HOSPITAL_COMMUNITY): Payer: Medicare Other

## 2015-01-30 ENCOUNTER — Ambulatory Visit (HOSPITAL_COMMUNITY): Payer: Self-pay

## 2015-01-30 NOTE — ED Notes (Signed)
Pt unable to void at this time. 

## 2015-01-30 NOTE — Progress Notes (Signed)
Pt unable to void.  Bladder scanned for 506 cc.  I and O cath done for 450 cc.  Urine was concentrated, no odor.  Pt threw up all lunch he had eaten.  Resting now.

## 2015-01-30 NOTE — Progress Notes (Signed)
Chaplain responded to code blue.  No family was present.  Checked back after the code was called by family over the telephone.  RN states that family is not coming in.  Please page if spiritual care services are needed. Theodoro Parma [phone]    2015-02-06 2100  Clinical Encounter Type  Referral From (Code blue)  Stress Factors  Patient Stress Factors Health changes

## 2015-01-30 NOTE — Code Documentation (Signed)
  Patient Name: Reginald Gutierrez   MRN: [id number]   Date of Birth/ Sex: 1966/08/30 , male      Admission Date: 23-Jan-2015  Attending Provider: Starleen Arms, MD  Primary Diagnosis: Trimalleolar fracture of ankle, closed, left, initial encounter [S82.852A] Syncope, unspecified syncope type [R55]   Indication: Pt was in his usual state of health until this PM, when he was noted to be pulseless and without respirations. Code blue was subsequently called. At the time of arrival on scene, ACLS protocol was underway.   Technical Description:  - CPR performance duration:  20  minutes  - Was defibrillation or cardioversion used? No   - Was external pacer placed? No  - Was patient intubated pre/post CPR? Yes   Medications Administered: Y = Yes; Blank = No Amiodarone    Atropine  y  Calcium    Epinephrine  y  Lidocaine    Magnesium    Norepinephrine    Phenylephrine    Sodium bicarbonate  y  Vasopressin    Other y (narcan)   Post CPR evaluation:  - Final Status - Was patient successfully resuscitated ? No   Miscellaneous Information:  - Time of death:  8:40  PM  - Primary team notified?  Yes  - Family Notified? Yes     Harold Barban, MD   01-23-2015, 9:07 PM

## 2015-01-30 NOTE — Progress Notes (Signed)
Pt here from [facility] via EMS.  Complaining of pain LLE, elevated on pillows.  Text put into Dr. Randol Kern about pts c/o pain.

## 2015-01-30 NOTE — Progress Notes (Signed)
Notified Dr. Randol Kern of pain score 8 out of 10 still after Dilaudid given at 11:20 am.

## 2015-01-30 NOTE — ED Provider Notes (Signed)
CSN: [id number]     Arrival date & time 03-Feb-2015  0319 History   First MD Initiated Contact with Patient 02/03/2015 0324     Chief Complaint  Patient presents with  . Ankle Injury     (Consider location/radiation/quality/duration/timing/severity/associated sxs/prior Treatment) Patient is a 49 y.o. male presenting with syncope. The history is provided by the patient. No language interpreter was used.  Loss of Consciousness Episode history:  Single Most recent episode:  Today Chronicity:  Recurrent Witnessed: no   Associated symptoms: no fever, no headaches, no shortness of breath and no vomiting   Associated symptoms comment:  Patient with complicated medical history including CAD with CABG 11/15 with PFO closure, Type 1 DM poorly controlled, psychiatric illness, ischemic cardiomyopathy presents to ED after he got up from sleep tonight and was walking to the kitchen. He woke up on the floor next to the refrigerator with back abrasions and severe pain to left ankle, worse with attempt to bear weight. He reports he has had syncope in the past when his blood sugar runs low. He has also had seizures with hypoglycemia. He lives alone and is not sure if he had a seizure or not. He reports that his blood sugar was 41 when he checked it on waking and he took a glucose tablet then. No urinary incontinence or tongue biting. He denies chest pain, SOB or injury other than to the left ankle. He does report history of bilateral lower extremity fractures after a similar episode of syncope in the past.    Past Medical History  Diagnosis Date  . Diabetes mellitus type 1   . Hyperlipidemia   . Depression   . Umbilical hernia   . Hypertension   . Glaucoma   . CHF (congestive heart failure)   . Hypothyroidism   . Hypoglycemia, unspecified   . Disorder of bone and cartilage, unspecified   . Acute posthemorrhagic anemia   . Anxiety state, unspecified   . Chronic pain syndrome   . Acute respiratory failure    . Dysphagia, oral phase   . Stress fracture of tibia or fibula   . Syncope and collapse   . Coronary artery disease 07/19/2012  . Ischemic cardiomyopathy   . Narcotic dependency, continuous   . Heart attack 12/16/11  . NSTEMI (non-ST elevated myocardial infarction) 12/19/2011; 07/03/2014    ; Hattie Perch 07/03/2014  . 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 Appendectomy      open  . Tibia im nail insertion  12/18/2011    Procedure: INTRAMEDULLARY (IM) NAIL TIBIAL;  Surgeon: Eugenia Mcalpine, MD;  Location: [facility] ORS;  Service: Orthopedics;  Laterality: Bilateral;  . Insertion of dialysis catheter  12/31/2011    Procedure: INSERTION OF DIALYSIS CATHETER;  Surgeon: Chuck Hint, MD;  Location: [facility] OR;  Service: Vascular;  Laterality: N/A;; "while in ICU; not permanent  . Tonsillectomy  1980  . Fracture surgery  2012    wrist  . Fracture surgery  2013    bilateral tibia  . Cardiac catheterization  07/2012; 01/2014  . Coronary artery bypass graft N/A 07/06/2014    Procedure: CORONARY ARTERY BYPASS GRAFTING (CABG) x4 using left internal mammary artery and bilateral thigh greater saphenous veins;  Surgeon: Purcell Nails, MD;  Location: [facility] OR;  Service: Open  Heart Surgery;  Laterality: N/A;  . Intraoperative transesophageal echocardiogram N/A 07/06/2014    Procedure: INTRAOPERATIVE TRANSESOPHAGEAL ECHOCARDIOGRAM;  Surgeon: Purcell Nails, MD;  Location: [facility] OR;  Service: Open Heart Surgery;  Laterality: N/A;  . Patent foramen ovale closure N/A 07/06/2014    Procedure: PATENT FORAMEN OVALE CLOSURE;  Surgeon: Purcell Nails, MD;  Location: [facility] OR;  Service: Open Heart Surgery;  Laterality: N/A;  . Left heart catheterization with coronary angiogram N/A 07/22/2012    Procedure: LEFT HEART CATHETERIZATION WITH CORONARY ANGIOGRAM;  Surgeon: Peter M Swaziland, MD;  Location: [facility]  CATH LAB;  Service: Cardiovascular;  Laterality: N/A;  . Left heart catheterization with coronary angiogram N/A 02/13/2014    Procedure: LEFT HEART CATHETERIZATION WITH CORONARY ANGIOGRAM;  Surgeon: [name], MD;  Location: [facility] CATH LAB;  Service: Cardiovascular;  Laterality: N/A;  . Left heart catheterization with coronary angiogram N/A 07/04/2014    Procedure: LEFT HEART CATHETERIZATION WITH CORONARY ANGIOGRAM;  Surgeon: [name], MD;  Location: [facility] CATH LAB;  Service: Cardiovascular;  Laterality: N/A;   Family History  Problem Relation Age of Onset  . Heart disease Father    History  Substance Use Topics  . Smoking status: Current Some Day Smoker -- 1.00 packs/day    Types: Cigarettes  . Smokeless tobacco: Never Used     Comment: pt states he has not smoked in 4 days  . Alcohol Use: No    Review of Systems  Constitutional: Negative for fever and chills.  HENT: Negative.   Eyes: Negative for visual disturbance.  Respiratory: Negative.  Negative for shortness of breath.   Cardiovascular: Positive for syncope.  Gastrointestinal: Negative.  Negative for vomiting and abdominal pain.  Endocrine: Negative for polydipsia and polyuria.  Musculoskeletal:       See HPI.  Skin:       See HPI.  Neurological: Positive for syncope. Negative for headaches.      Allergies  Review of patient's allergies indicates no known allergies.  Home Medications   Prior to Admission medications   Medication Sig Start Date End Date Taking? Authorizing Provider  ACCU-CHEK AVIVA PLUS test strip TEST 6 TIMES A DAY AS DIRECTED. 05/04/13  Yes Romero Belling, MD  ARIPiprazole (ABILIFY) 5 MG tablet Take 2.5 mg by mouth 2 (two) times daily.   Yes Historical Provider, MD  aspirin EC 81 MG tablet Take 81 mg by mouth daily.   Yes Historical Provider, MD  atorvastatin (LIPITOR) 40 MG tablet take 1 tablet by mouth once daily 05/21/14  Yes Tiffany L Reed, DO  cephALEXin (KEFLEX) 500 MG capsule Take 500  mg by mouth 4 (four) times daily - after meals and at bedtime. 01/08/15  Yes Historical Provider, MD  clonazePAM (KLONOPIN) 1 MG tablet Take 1 mg by mouth daily as needed for anxiety. FOR ANXIETY AND SLEEP 09/17/14  Yes Historical Provider, MD  DULoxetine 40 MG CPEP Take 40 mg by mouth daily. 04/06/14  Yes Alison Murray, MD  ferrous sulfate 325 (65 FE) MG tablet Take 325 mg by mouth daily with breakfast.   Yes Historical Provider, MD  glucagon (GLUCAGON EMERGENCY) 1 MG injection Inject 1 mg into the muscle once as needed (for low blood sugar). 03/19/14  Yes Romero Belling, MD  glucose blood test strip TEST BLOOD SUGAR 6 TIMES A DAY AS INSTRUCTED BY DR Everardo All 05/04/13  Yes Romero Belling, MD  insulin glargine (LANTUS) 100 UNIT/ML injection Inject 15 Units into the  skin 2 (two) times daily. Inject 15 units into skin in the morning and 15 units into skin in the evening   Yes Historical Provider, MD  insulin regular (NOVOLIN R,HUMULIN R) 100 units/mL injection Inject 2-8 Units into the skin See admin instructions. Sliding scale.   Give 2-8 units four times a day   Yes Historical Provider, MD  INSULIN SYRINGE .5CC/29G 29G X 1/2" 0.5 ML MISC 1 Syringe by Does not apply route daily. 05/18/13  Yes Romero Belling, MD  Insulin Syringe-Needle U-100 (B-D INS SYRINGE 0.5CC/30GX1/2") 30G X 1/2" 0.5 ML MISC Use 3-4 times per day 06/15/14  Yes Romero Belling, MD  levothyroxine (SYNTHROID, LEVOTHROID) 125 MCG tablet Take 1 tablet (125 mcg total) by mouth daily before breakfast. 09/24/14  Yes Romero Belling, MD  lisinopril (PRINIVIL,ZESTRIL) 40 MG tablet Take 40 mg by mouth daily. TAKES 1/2 TABLET EVERY DAY 08/25/14  Yes Historical Provider, MD  metoprolol tartrate (LOPRESSOR) 25 MG tablet Take 0.5 tablets (12.5 mg total) by mouth 2 (two) times daily. 10/26/14  Yes Lyn Records, MD  Multiple Vitamin (MULTIVITAMIN WITH MINERALS) TABS tablet Take 1 tablet by mouth daily. Diabetes daily pak from [employer]   Yes Historical Provider, MD   sildenafil (VIAGRA) 100 MG tablet Take 1 tablet (100 mg total) by mouth daily as needed for erectile dysfunction. 01/01/15  Yes Lyn Records, MD  lidocaine (LIDODERM) 5 % Place 1 patch onto the skin daily. Remove & Discard patch within 12 hours or as directed by MD Patient not taking: Reported on 2015/02/02 11/20/14   Judie Bonus, MD   BP 128/70 mmHg  Pulse 95  Temp(Src) 98.3 F (36.8 C) (Oral)  Resp 16  SpO2 95% Physical Exam  Constitutional: He is oriented to person, place, and time. He appears well-developed and well-nourished. No distress.  HENT:  Head: Normocephalic.  Neck: Normal range of motion. Neck supple.  Cardiovascular: Normal rate and regular rhythm.   Pulmonary/Chest: Effort normal and breath sounds normal. He has no wheezes. He has no rales.  Abdominal: Soft. Bowel sounds are normal. There is no tenderness. There is no rebound and no guarding.  Musculoskeletal: Normal range of motion.  Left ankle significantly swollen without gross bony deformity. Tender to any palpation or manipulation. Neurovascularly intact. No midline or paraspinal tenderness.  Neurological: He is alert and oriented to person, place, and time. Coordination normal.  Skin: Skin is warm and dry. No rash noted.  Superficial abrasions to left posterior shoulder and upper back.   Psychiatric: He has a normal mood and affect.    ED Course  Procedures (including critical care time) Labs Review Labs Reviewed  CBG MONITORING, ED - Abnormal; Notable for the following:    Glucose-Capillary 146 (*)    All other components within normal limits  CBC WITH DIFFERENTIAL/PLATELET  COMPREHENSIVE METABOLIC PANEL  URINALYSIS, ROUTINE W REFLEX MICROSCOPIC  TROPONIN I   Results for orders placed or performed during the hospital encounter of 2015/02/02  Comprehensive metabolic panel  Result Value Ref Range   Sodium 142 135 - 145 mmol/L   Potassium 4.5 3.5 - 5.1 mmol/L   Chloride 105 101 - 111 mmol/L   CO2 26  22 - 32 mmol/L   Glucose, Bld 158 (H) 65 - 99 mg/dL   BUN 24 (H) 6 - 20 mg/dL   Creatinine, Ser 1.09 0.61 - 1.24 mg/dL   Calcium 9.1 8.9 - 32.3 mg/dL   Total Protein 7.7 6.5 - 8.1 g/dL  Albumin 4.3 3.5 - 5.0 g/dL   AST 24 15 - 41 U/L   ALT 15 (L) 17 - 63 U/L   Alkaline Phosphatase 90 38 - 126 U/L   Total Bilirubin 0.9 0.3 - 1.2 mg/dL   GFR calc non Af Amer >60 >60 mL/min   GFR calc Af Amer >60 >60 mL/min   Anion gap 11 5 - 15  Troponin I  Result Value Ref Range   Troponin I <0.03 <0.031 ng/mL  CBC with Differential/Platelet  Result Value Ref Range   WBC 10.7 (H) 4.0 - 10.5 K/uL   RBC 4.33 4.22 - 5.81 MIL/uL   Hemoglobin 12.0 (L) 13.0 - 17.0 g/dL   HCT 86.5 (L) 78.4 - 69.6 %   MCV 88.7 78.0 - 100.0 fL   MCH 27.7 26.0 - 34.0 pg   MCHC 31.3 30.0 - 36.0 g/dL   RDW 29.5 (H) 28.4 - 13.2 %   Platelets 294 150 - 400 K/uL   Neutrophils Relative % 82 (H) 43 - 77 %   Neutro Abs 8.7 (H) 1.7 - 7.7 K/uL   Lymphocytes Relative 10 (L) 12 - 46 %   Lymphs Abs 1.1 0.7 - 4.0 K/uL   Monocytes Relative 7 3 - 12 %   Monocytes Absolute 0.8 0.1 - 1.0 K/uL   Eosinophils Relative 1 0 - 5 %   Eosinophils Absolute 0.1 0.0 - 0.7 K/uL   Basophils Relative 0 0 - 1 %   Basophils Absolute 0.0 0.0 - 0.1 K/uL  POC CBG, ED  Result Value Ref Range   Glucose-Capillary 146 (H) 65 - 99 mg/dL    Imaging Review Dg Ankle Complete Left  2015/01/28   CLINICAL DATA:  Patient woke up in the kitchen floor at home. Unable the stand. Passed out if 4 point sugar dropped and broke legs. Complains of broken left ankle.  EXAM: LEFT ANKLE COMPLETE - 3+ VIEW  COMPARISON:  Left tibia fibula 01/26/2012  FINDINGS: Postoperative changes with intra medullary rod and screw fixation of the healed fracture of the distal left tibia. Acute fractures are demonstrated of the left medial malleolus with lateral displacement of the distal fracture fragment and of the talus with respect to the tibia. Acute fracture of the distal left fibula  with lateral displacement and overriding of distal fracture fragment. Posterior malleolar fracture with posterior displacement of distal fracture fragment and posterior displacement of the talus with respect to the tibia. Soft tissue swelling. Vascular calcifications.  IMPRESSION: Trimalleolar fracture dislocation of the left ankle.   Electronically Signed   By: Burman Nieves M.D.   On: 28-Jan-2015 03:54     EKG Interpretation   Date/Time:  Sunday 28-Jan-2015 03:51:13 EDT Ventricular Rate:  93 PR Interval:  135 QRS Duration: 83 QT Interval:  342 QTC Calculation: 425 R Axis:   89 Text Interpretation:  Sinus rhythm Multiple ventricular premature  complexes Left atrial enlargement Probable LVH with secondary repol abnrm  Anterior Q waves, possibly due to LVH PVCs new from prior Confirmed by  OTTER  MD, OLGA ([id number]) on Jan 28, 2015 4:05:11 AM      MDM   Final diagnoses:  None    1. Syncope 2. Trimalleolar fracture, left  The patient presents with syncopal episode and CBG 41 on waking - likely the cause of synocpe as he reports has happened in the past. No chest pain. Trimalleolar fracture requiring repair - Dr. Thomasena Edis is his orthopedist. Discussed with Dr. Victorino Dike who will  consult on the patient later today.  Discussed with Dr. Lovell Sheehan who accepts the patient for admission. Transfer to [facility] at Dr. Laverta Baltimore request. Cardiology will be consulted as well.   The patient repeatedly asks for additional pain medications. PRN Dilaudid ordered. Splint to be placed by ortho tech as soon as possible. Ice and elevation now.      Elpidio Anis, PA-C 01/23/2015 0454  Marisa Severin, MD 01-23-15 [phone]

## 2015-01-30 NOTE — ED Notes (Signed)
Pt comes via EMS from home. Pt states he woke up in the kitchen floor at home. Pt reports he tried to stand and "it felt like mush." Pt states he has passed out before when his sugar dropped and broke legs. Pt c/o broken left ankle.

## 2015-01-30 NOTE — ED Notes (Signed)
[facility] at bedside.  Noted cbg drop 55.  Pt asymptomatic.  Treated by EMS with D50.  Dextrose fluid was hung.  Hold for repeat cbg prior to transport.

## 2015-01-30 NOTE — Progress Notes (Signed)
Code Blue called around 2018/12/28. Per RN, she heard pt snoring and went to check on him and he was without respirations or pulse. He has had hypoglycemia since admission, but sugar at that time was 460. The cause of cardiac/resp arrest is unknown. ACLS protocol initiated and continued until 2038/12/28 when this NP contacted family and the mother and sister of the pt wanted the Code Blue stopped.  This NP spoke with both mother and sister over the phone and both confirmed the termination of ACLS. Both stated pt had a very poor quality of life, hx of cocaine abuse, CABG by age 49, renal failure with Diabetes Type I. This NP asked several times and confirmed their understanding that stopping ACLS would cause his death because we have not been able to restore spontaneous circulation or breathing. Both mother and sister understand this action would result in pt's death. Sister is a Charity fundraiser at [employer] a [employer] of [employer]. Therefore, ACLS/Code stopped at Dec 28, 2038 and pt declared dead at that time. Death certificate completed. Sister and mother elect NO autopsy. The family elects not to come to the hospital. Jimmye Norman, NP [facility]

## 2015-01-30 NOTE — ED Notes (Signed)
Pt CBG was tested low.  D50 given.  Repeat cbg wnl at 114.  Pt asymptomatic during event.  Post dilaudid, pt decreased sats to 89%.  Remained alert/oriented and asymptomatic. Breathing stable.  Placed on 2l per  for Sat maintenance.

## 2015-01-30 NOTE — Progress Notes (Signed)
Patient Demographics  Reginald Gutierrez, is a 49 y.o. male, DOB - April 20, 1966, WIO:[id number]  Admit date - 21-Jan-2015   Admitting Physician Ron Parker, MD  Outpatient Primary MD for the patient is Judie Bonus, MD  LOS - 0   Chief Complaint  Patient presents with  . Ankle Injury       Admission HPI/Brief narrative: Reginald Gutierrez is a 49 y.o. male with a history of Uncontrolled DM-1, CAD S/P CABGx 4 11/2015m Chronic systolic CHF, HTN, Hyperlipidemia, and Hypothroidism who was admitted secondary to syncope with hypoglycemia, complaining of left ankle pain after syncope ,He was brought to the ED and X-rays revealed a Trimalleolar Fracture of the Left ankle.He Reports having a similar episode 2 years ago when he passed out due to hypoglycemia and broke both of his legs.   Subjective:   Reginald Gutierrez today has, No headache, No chest pain, No abdominal pain - No Nausea, No new weakness tingling or numbness, No Cough - SOB. Plan off pain at left ankle.  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   Hyperlipidemia   Chronic systolic heart failure   CAD, multiple vessel   Trimalleolar fracture of ankle, closed   Syncope and collapse   Hypoglycemia  Closed left ankle fracture/Trimalleolar fracture of ankle, closed - Orthopedics Dr Victorino Dike  consulted for surgery -Splint /Immobilizer to be applied by the Ortho Tech - When necessary pain medication -  was cleared for surgery by cardiology  Syncope and collapse - Due to Hypoglycemic episode - Telemetry Monitoring  Hypoglycemia - We'll hold insulin - Patient had hypoglycemia on D5 half-normal saline, so we'll change to D10 at 75 mL/h  Type 1 diabetes  mellitus with renal manifestations- Uncontrolled  - On Lantus 15 units BID at home, which we will hold - Check HbA1C  Chronic systolic heart failure -Monitor for Fluid Overload  CAD, multiple vessel- Hx of NSTEMI, and CABG x 4 vessels 07/2104 - Patient cleared by cardiology for surgery - Continue with Telemetry Monitoring - Continue with aspirin, statin, beta blockers, lisinopril  HYPERTENSION, BENIGN ESSENTIAL - Continue with home medication   Hypothyroidism - Continue with levofloxacin   Hyperlipidemia  - Continue with statin   TOBACCO ABUSE - reports that he is down to 1/2 pack daily - Nicotine Patch daily    Code Status: Full  Family Communication: Mother at bedside  Disposition Plan: Pending further workup  Procedures  None   Consults   Orthopedic Cardiology    Medications  Scheduled Meds: . HYDROmorphone      . nicotine  7 mg Transdermal Daily   Continuous Infusions: . dextrose 5 % and 0.45% NaCl 50 mL/hr at 01-21-2015 1050  . dextrose 5 % and 0.45% NaCl 50 mL/hr at 2015-01-21 0948   PRN Meds:.acetaminophen **OR** acetaminophen, hydrALAZINE, HYDROmorphone (DILAUDID) injection, ondansetron **OR** ondansetron (ZOFRAN) IV  DVT Prophylaxis Heparin -   Lab Results  Component Value Date   PLT 294 01/21/2015    Antibiotics    Anti-infectives    None  Objective:   Filed Vitals:   01/19/2015 0600 01-19-2015 0630 Jan 19, 2015 0937 19-Jan-2015 1048  BP: 129/38 136/84 138/73 143/78  Pulse:   113 110  Temp:   98.4 F (36.9 C) 97.9 F (36.6 C)  TempSrc:   Oral Oral  Resp: SpO2:   93% 97%    Wt Readings from Last 3 Encounters:  01/01/15 85.186 kg (187 lb 12.8 oz)  11/20/14 87 kg (191 lb 12.8 oz)  10/18/14 85.3 kg (188 lb 0.8 oz)    No intake or output data in the 24 hours ending Jan 19, 2015 1430   Physical Exam  Awake Alert, Oriented X 3, No new  F.N deficits, Normal affect [place].AT,PERRAL Supple Neck,No JVD, No cervical lymphadenopathy appriciated.  Symmetrical Chest wall movement, Good air movement bilaterally, CTAB RRR,No Gallops,Rubs or new Murmurs, No Parasternal Heave +ve B.Sounds, Abd Soft, No tenderness, No organomegaly appriciated, No rebound - guarding or rigidity. No Cyanosis,left ankle in cast  Data Review   Micro Results No results found for this or any previous visit (from the past 240 hour(s)).  Radiology Reports Dg Ankle Complete Left  01-19-2015   CLINICAL DATA:  Patient woke up in the kitchen floor at home. Unable the stand. Passed out if 4 point sugar dropped and broke legs. Complains of broken left ankle.  EXAM: LEFT ANKLE COMPLETE - 3+ VIEW  COMPARISON:  Left tibia fibula 01/26/2012  FINDINGS: Postoperative changes with intra medullary rod and screw fixation of the healed fracture of the distal left tibia. Acute fractures are demonstrated of the left medial malleolus with lateral displacement of the distal fracture fragment and of the talus with respect to the tibia. Acute fracture of the distal left fibula with lateral displacement and overriding of distal fracture fragment. Posterior malleolar fracture with posterior displacement of distal fracture fragment and posterior displacement of the talus with respect to the tibia. Soft tissue swelling. Vascular calcifications.  IMPRESSION: Trimalleolar fracture dislocation of the left ankle.   Electronically Signed   By: Burman Nieves M.D.   On: 2015-01-19 03:54     CBC  Recent Labs Lab 01/19/15 0454  WBC 10.7*  HGB 12.0*  HCT 38.4*  PLT 294  MCV 88.7  MCH 27.7  MCHC 31.3  RDW 16.1*  LYMPHSABS 1.1  MONOABS 0.8  EOSABS 0.1  BASOSABS 0.0    Chemistries   Recent Labs Lab 01-19-15 0431  NA 142  K 4.5  CL 105  CO2 26  GLUCOSE 158*  BUN 24*  CREATININE 0.89  CALCIUM 9.1  AST 24  ALT 15*  ALKPHOS 90  BILITOT 0.9    ------------------------------------------------------------------------------------------------------------------ estimated creatinine clearance is 105.8 mL/min (by C-G formula based on Cr of 0.89). ------------------------------------------------------------------------------------------------------------------ No results for input(s): HGBA1C in the last 72 hours. ------------------------------------------------------------------------------------------------------------------ No results for input(s): CHOL, HDL, LDLCALC, TRIG, CHOLHDL, LDLDIRECT in the last 72 hours. ------------------------------------------------------------------------------------------------------------------ No results for input(s): TSH, T4TOTAL, T3FREE, THYROIDAB in the last 72 hours.  Invalid input(s): FREET3 ------------------------------------------------------------------------------------------------------------------ No results for input(s): VITAMINB12, FOLATE, FERRITIN, TIBC, IRON, RETICCTPCT in the last 72 hours.  Coagulation profile No results for input(s): INR, PROTIME in the last 168 hours.  No results for input(s): DDIMER in the last 72 hours.  Cardiac Enzymes  Recent Labs Lab 01-19-15 0431  TROPONINI <0.03   ------------------------------------------------------------------------------------------------------------------ Invalid input(s): POCBNP     Time Spent in minutes   30 minutes   Jolon Degante M.D on Jan 19, 2015 at 2:30 PM  Between 7am to  7pm - Pager - [phone]  After 7pm go to [email or web address] - password [email or web address]  [facility]   Office  [phone]

## 2015-01-30 NOTE — ED Notes (Signed)
Still unable to urinate at this time.

## 2015-01-30 NOTE — ED Notes (Signed)
Bed: WA03 Expected date:  Expected time:  Means of arrival:  Comments: EMS left foot pain / swelling

## 2015-01-30 NOTE — Progress Notes (Signed)
Called Dr. Randol Kern regarding pt.and my concern for his safety.  Pt impulsive and mother has been at the bedside all day but needs to leave to get some rest.  I had concern that he might try and get up OOB on his own.  Bed alarm on but requested a safety sitter and Dr. Randol Kern gave me an order for the safety sitter.  Request made known to [facility], Database administrator, by Lupita Leash, CN and none available at this time.

## 2015-01-30 NOTE — H&P (Signed)
[facility] Admission History and Physical       Reginald Gutierrez WGN:[id number] DOB: 11-06-1965 DOA: 2015/02/07  Referring physician: EDP Dr. Ouida Sills  PCP: Judie Bonus, MD  Specialists:   Chief Complaint: Left Ankle Pain  HPI: Reginald Gutierrez is a 49 y.o. male with a history of Uncontrolled DM-1, CAD S/P CABGx 4 11/2015m Chronic systolic CHF, HTN, Hyperlipidemia, and Hypothroidism who was walking into his kitchen around 3 AM and passed out and fell onto the floor.   He reports when he became conscious he checked his blood sugar and it was 41, and he had increased left ankle pain.  He denies having any chest pain or SOB associated with the episode.  He was brought to the ED and X-rays revealed a Trimalleolar Fracture of the Left ankle.  The EDP consulted Orthopedics Dr, Victorino Dike and arrangements were made to transfer patient to [facility].   He  Reports having a similar episode 2 years ago when he passed out due to hypoglycemia and broke both of his legs.       Review of Systems:  Constitutional: No Weight Loss, No Weight Gain, Night Sweats, Fevers, Chills, Dizziness, Light Headedness, Fatigue, or Generalized Weakness HEENT: No Headaches, Difficulty Swallowing,Tooth/Dental Problems,Sore Throat,  No Sneezing, Rhinitis, Ear Ache, Nasal Congestion, or Post Nasal Drip,  Cardio-vascular:  No Chest pain, Orthopnea, PND, Edema in Lower Extremities, Anasarca, Dizziness, Palpitations  Resp: No Dyspnea, No DOE, No Productive Cough, No Non-Productive Cough, No Hemoptysis, No Wheezing.    GI: No Heartburn, Indigestion, Abdominal Pain, Nausea, Vomiting, Diarrhea, Constipation, Hematemesis, Hematochezia, Melena, Change in Bowel Habits,  Loss of Appetite  GU: No Dysuria, No Change in Color of Urine, No Urgency or Urinary Frequency, No Flank pain.  Musculoskeletal: +Left Ankle Pain and  Swelling, +Decreased Range of Motion of Left Ankle, No Back Pain.  Neurologic: +Syncope, No Seizures,  Muscle Weakness, Paresthesia, Vision Disturbance or Loss, No Diplopia, No Vertigo, No Difficulty Walking,  Skin: No Rash or Lesions. Psych: No Change in Mood or Affect, No Depression or Anxiety, No Memory loss, No Confusion, or Hallucinations   Past Medical History  Diagnosis Date  . Diabetes mellitus type 1   . Hyperlipidemia   . Depression   . Umbilical hernia   . Hypertension   . Glaucoma   . CHF (congestive heart failure)   . Hypothyroidism   . Hypoglycemia, unspecified   . Disorder of bone and cartilage, unspecified   . Acute posthemorrhagic anemia   . Anxiety state, unspecified   . Chronic pain syndrome   . Acute respiratory failure   . Dysphagia, oral phase   . Stress fracture of tibia or fibula   . Syncope and collapse   . Coronary artery disease 07/19/2012  . Ischemic cardiomyopathy   . Narcotic dependency, continuous   . Heart attack 12/16/11  . NSTEMI (non-ST elevated myocardial infarction) 12/19/2011; 07/03/2014    ; Hattie Perch 07/03/2014  . S/P CABG x 4 and closure of PFO 07/06/2014    LIMA to LAD, SVG to D1, SVG to OM1, SVG to RPLB, EVH via bilateral thighs      Past Surgical History  Procedure Laterality Date  . Appendectomy      open  . Tibia im nail insertion  12/18/2011    Procedure: INTRAMEDULLARY (IM) NAIL TIBIAL;  Surgeon: Eugenia Mcalpine, MD;  Location: [facility] ORS;  Service: Orthopedics;  Laterality: Bilateral;  . Insertion of dialysis catheter  12/31/2011  Procedure: INSERTION OF DIALYSIS CATHETER;  Surgeon: Chuck Hint, MD;  Location: [facility] OR;  Service: Vascular;  Laterality: N/A;; "while in ICU; not permanent  . Tonsillectomy  1980  . Fracture surgery  2012    wrist  . Fracture surgery  2013    bilateral tibia  . Cardiac catheterization  07/2012; 01/2014  . Coronary artery bypass graft N/A 07/06/2014    Procedure: CORONARY ARTERY BYPASS GRAFTING (CABG) x4 using left internal mammary artery and bilateral thigh greater saphenous veins;  Surgeon:  Purcell Nails, MD;  Location: [facility] OR;  Service: Open Heart Surgery;  Laterality: N/A;  . Intraoperative transesophageal echocardiogram N/A 07/06/2014    Procedure: INTRAOPERATIVE TRANSESOPHAGEAL ECHOCARDIOGRAM;  Surgeon: Purcell Nails, MD;  Location: [facility] OR;  Service: Open Heart Surgery;  Laterality: N/A;  . Patent foramen ovale closure N/A 07/06/2014    Procedure: PATENT FORAMEN OVALE CLOSURE;  Surgeon: Purcell Nails, MD;  Location: [facility] OR;  Service: Open Heart Surgery;  Laterality: N/A;  . Left heart catheterization with coronary angiogram N/A 07/22/2012    Procedure: LEFT HEART CATHETERIZATION WITH CORONARY ANGIOGRAM;  Surgeon: Peter M Swaziland, MD;  Location: [facility] CATH LAB;  Service: Cardiovascular;  Laterality: N/A;  . Left heart catheterization with coronary angiogram N/A 02/13/2014    Procedure: LEFT HEART CATHETERIZATION WITH CORONARY ANGIOGRAM;  Surgeon: [name], MD;  Location: [facility] CATH LAB;  Service: Cardiovascular;  Laterality: N/A;  . Left heart catheterization with coronary angiogram N/A 07/04/2014    Procedure: LEFT HEART CATHETERIZATION WITH CORONARY ANGIOGRAM;  Surgeon: [name], MD;  Location: [facility] C[facility] LAB;  Service: Cardiovascular;  Laterality: N/A;      Prior to Admission medications   Medication Sig Start Date End Date Taking? Authorizing Provider  ACCU-CHEK AVIVA PLUS test strip TEST 6 TIMES A DAY AS DIRECTED. 05/04/13  Yes Romero Belling, MD  ARIPiprazole (ABILIFY) 5 MG tablet Take 2.5 mg by mouth 2 (two) times daily.   Yes Historical Provider, MD  aspirin EC 81 MG tablet Take 81 mg by mouth daily.   Yes Historical Provider, MD  atorvastatin (LIPITOR) 40 MG tablet take 1 tablet by mouth once daily 05/21/14  Yes Tiffany L Reed, DO  cephALEXin (KEFLEX) 500 MG capsule Take 500 mg by mouth 4 (four) times daily - after meals and at bedtime. 01/08/15  Yes Historical Provider, MD  clonazePAM (KLONOPIN) 1 MG tablet Take 1 mg by mouth daily as needed for anxiety. FOR ANXIETY  AND SLEEP 09/17/14  Yes Historical Provider, MD  DULoxetine 40 MG CPEP Take 40 mg by mouth daily. 04/06/14  Yes Alison Murray, MD  ferrous sulfate 325 (65 FE) MG tablet Take 325 mg by mouth daily with breakfast.   Yes Historical Provider, MD  glucagon (GLUCAGON EMERGENCY) 1 MG injection Inject 1 mg into the muscle once as needed (for low blood sugar). 03/19/14  Yes Romero Belling, MD  glucose blood test strip TEST BLOOD SUGAR 6 TIMES A DAY AS INSTRUCTED BY DR Everardo All 05/04/13  Yes Romero Belling, MD  insulin glargine (LANTUS) 100 UNIT/ML injection Inject 15 Units into the skin 2 (two) times daily. Inject 15 units into skin in the morning and 15 units into skin in the evening   Yes Historical Provider, MD  insulin regular (NOVOLIN R,HUMULIN R) 100 units/mL injection Inject 2-8 Units into the skin See admin instructions. Sliding scale.   Give 2-8 units four times a day   Yes Historical Provider,  MD  INSULIN SYRINGE .5CC/29G 29G X 1/2" 0.5 ML MISC 1 Syringe by Does not apply route daily. 05/18/13  Yes Romero Belling, MD  Insulin Syringe-Needle U-100 (B-D INS SYRINGE 0.5CC/30GX1/2") 30G X 1/2" 0.5 ML MISC Use 3-4 times per day 06/15/14  Yes Romero Belling, MD  levothyroxine (SYNTHROID, LEVOTHROID) 125 MCG tablet Take 1 tablet (125 mcg total) by mouth daily before breakfast. 09/24/14  Yes Romero Belling, MD  lisinopril (PRINIVIL,ZESTRIL) 40 MG tablet Take 40 mg by mouth daily. TAKES 1/2 TABLET EVERY DAY 08/25/14  Yes Historical Provider, MD  metoprolol tartrate (LOPRESSOR) 25 MG tablet Take 0.5 tablets (12.5 mg total) by mouth 2 (two) times daily. 10/26/14  Yes Lyn Records, MD  Multiple Vitamin (MULTIVITAMIN WITH MINERALS) TABS tablet Take 1 tablet by mouth daily. Diabetes daily pak from [employer]   Yes Historical Provider, MD  sildenafil (VIAGRA) 100 MG tablet Take 1 tablet (100 mg total) by mouth daily as needed for erectile dysfunction. 01/01/15  Yes Lyn Records, MD  lidocaine (LIDODERM) 5 % Place 1 patch onto the skin  daily. Remove & Discard patch within 12 hours or as directed by MD Patient not taking: Reported on Jan 25, 2015 11/20/14   Judie Bonus, MD     No Known Allergies  Social History:  reports that he has been smoking Cigarettes.  He has been smoking about 1.00 pack per day. He has never used smokeless tobacco. He reports that he does not drink alcohol or use illicit drugs.      Family History  Problem Relation Age of Onset  . Heart disease Father        Physical Exam:  GEN:  Pleasant Obese  49 y.o. Caucasian male examined and in Discomfort but No Acute Distress; cooperative with exam Filed Vitals:   01/25/15 0325  BP: 128/70  Pulse: 95  Temp: 98.3 F (36.8 C)  TempSrc: Oral  Resp: 16  SpO2: 95%   Blood pressure 128/70, pulse 95, temperature 98.3 F (36.8 C), temperature source Oral, resp. rate 16, SpO2 95 %. PSYCH: He is alert and oriented x4; does not appear anxious does not appear depressed; affect is normal HEENT: Normocephalic and Atraumatic, Mucous membranes pink; PERRLA; EOM intact; Fundi:  Benign;  No scleral icterus, Nares: Patent, Oropharynx: Clear, Fair Dentition,    Neck:  FROM, No Cervical Lymphadenopathy nor Thyromegaly or Carotid Bruit; No JVD; Breasts:: Not examined CHEST WALL: No tenderness CHEST: Normal respiration, clear to auscultation bilaterally HEART: Regular rate and rhythm; no murmurs rubs or gallops BACK: No kyphosis or scoliosis; No CVA tenderness ABDOMEN: Positive Bowel Sounds,  Obese, Soft Non-Tender, No Rebound or Guarding; No Masses, No Organomegaly. Rectal Exam: Not done EXTREMITIES: No Cyanosis, Clubbing, or Edema; No Ulcerations. Genitalia: not examined PULSES: 2+ and symmetric SKIN: Normal hydration no rash or ulceration CNS:  Alert and Oriented x 4, No Focal Deficits Vascular: pulses palpable throughout    Labs on Admission:  Basic Metabolic Panel:  Recent Labs Lab 01/25/15 0431  NA 142  K 4.5  CL 105  CO2 26  GLUCOSE 158*   BUN 24*  CREATININE 0.89  CALCIUM 9.1   Liver Function Tests:  Recent Labs Lab 01-25-2015 0431  AST 24  ALT 15*  ALKPHOS 90  BILITOT 0.9  PROT 7.7  ALBUMIN 4.3   No results for input(s): LIPASE, AMYLASE in the last 168 hours. No results for input(s): AMMONIA in the last 168 hours. CBC:  Recent Labs Lab 25-Jan-2015 0454  WBC 10.7*  NEUTROABS 8.7*  HGB 12.0*  HCT 38.4*  MCV 88.7  PLT 294   Cardiac Enzymes:  Recent Labs Lab 02-12-15 0431  TROPONINI <0.03    BNP (last 3 results) No results for input(s): BNP in the last 8760 hours.  ProBNP (last 3 results)  Recent Labs  07/03/14 1453 07/05/14 1329 11/20/14 1504  PROBNP 10357.0* 821.2* 211.0*    CBG:  Recent Labs Lab 01/07/15 1331 01/07/15 1421 02-12-2015 0349  GLUCAP 232* 199* 146*    Radiological Exams on Admission: Dg Ankle Complete Left  February 12, 2015   CLINICAL DATA:  Patient woke up in the kitchen floor at home. Unable the stand. Passed out if 4 point sugar dropped and broke legs. Complains of broken left ankle.  EXAM: LEFT ANKLE COMPLETE - 3+ VIEW  COMPARISON:  Left tibia fibula 01/26/2012  FINDINGS: Postoperative changes with intra medullary rod and screw fixation of the healed fracture of the distal left tibia. Acute fractures are demonstrated of the left medial malleolus with lateral displacement of the distal fracture fragment and of the talus with respect to the tibia. Acute fracture of the distal left fibula with lateral displacement and overriding of distal fracture fragment. Posterior malleolar fracture with posterior displacement of distal fracture fragment and posterior displacement of the talus with respect to the tibia. Soft tissue swelling. Vascular calcifications.  IMPRESSION: Trimalleolar fracture dislocation of the left ankle.   Electronically Signed   By: Burman Nieves M.D.   On: 02-12-2015 03:54     EKG: Independently reviewed. Normal Sinus Rhythm Rate = 93,  +LVH changes,     Assessment/Plan:   49 y.o. male with  Principal Problem:   1.    Closed left ankle fracture/Trimalleolar fracture of ankle, closed   Orthopedics Dr Lendell Caprice al consulted for surgery   Splint /Immobilizer to be applied by the Ortho Tech   NPO since 3 AM   PRN IV Dilaudid for Pain   Active Problems:   2.    Syncope and collapse- Due to Hypoglycemic episode   Telemetry Monitoring   Monitor Glucose Levels        3.    Hypoglycemia   Half Dose lantus now 7 units BID    Hold If Glucose < 100   Monitor Glucose levels every 1 hour x 8   Follow Hypoglycemic Protocol        4.   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   Chronic systolic heart failure   Monitor for Fluid Overload        6.   CAD, multiple vessel- Hx of NSTEMI, and CABG x 4 vessels 07/2104   Awaiting Cardiac Clearance   Telemetry Monitoring        7.   HYPERTENSION, BENIGN ESSENTIAL   IV Hydralazine PRN while NPO   On Lisinopril Rx     8.   Hypothyroidism   On Levothyroxine   Check TSH level     9.   Hyperlipidemia       On Atorvastatin Rx    Resume after Surgery   10.   TOBACCO ABUSE- reports that he is down to 1/2 pack daily   Nicotine Patch daily     11.  DVT Prophylaxis    SCDs  Code Status:     FULL CODE        Family Communication:   Mother at Bedside     Disposition Plan:    Inpatient Status        Time spent: 18 Minutes      Reginald Gutierrez [facility] Pager [phone]   If 7AM -7PM Please Contact the Day Rounding Team MD for [facility]  If 7PM-7AM, Please Contact Night-Floor Coverage  [email or web address] Password [email or web address] February 06, 2015, 6:44 AM     ADDENDUM:   Patient was seen and examined on 02/06/15

## 2015-01-30 NOTE — Progress Notes (Signed)
Bladder scan 506

## 2015-01-30 NOTE — Consult Note (Signed)
Reason for Consult:  Left ankle pain Referring Physician: Dr. Shelva Majestic is an 49 y.o. male.  HPI: 49 y/o male with uncontrolled diabetes and CAD fell early this morning in his kitchen during an episode of hypoglycemia.  He c/o pain in the left ankle since the fall.  He c/o aching pain that is moderate in intensity with rest and more severe with any motion.  He has a h/o IM nailing of left tibia fracture by Dr. Theda Sers 3 years ago.  He denies any recent injury to the left ankle.  He smokes 1/2 ppd cigarettes.  He was splinted in the ER, and I'm asked to consult for evaluation and treatment of this injury.  Past Medical History  Diagnosis Date  . Diabetes mellitus type 1   . Hyperlipidemia   . Depression   . Umbilical hernia   . Hypertension   . Glaucoma   . CHF (congestive heart failure)   . Hypothyroidism   . Hypoglycemia, unspecified   . Disorder of bone and cartilage, unspecified   . Acute posthemorrhagic anemia   . Anxiety state, unspecified   . Chronic pain syndrome   . Acute respiratory failure   . Dysphagia, oral phase   . Stress fracture of tibia or fibula   . Syncope and collapse   . Coronary artery disease 07/19/2012  . Ischemic cardiomyopathy   . Narcotic dependency, continuous   . Heart attack 12/16/11  . NSTEMI (non-ST elevated myocardial infarction) 12/19/2011; 07/03/2014    ; Archie Endo 07/03/2014  . 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 Seizures     Past Surgical History  Procedure Laterality Date  . Appendectomy      open  . Tibia im nail insertion  12/18/2011    Procedure: INTRAMEDULLARY (IM) NAIL TIBIAL;  Surgeon: Sydnee Cabal, MD;  Location: [facility];  Service: Orthopedics;  Laterality: Bilateral;  . Insertion of dialysis catheter  12/31/2011    Procedure: INSERTION OF DIALYSIS CATHETER;  Surgeon: Angelia Mould, MD;  Location: [place];  Service: Vascular;   Laterality: N/A;; "while in ICU; not permanent  . Tonsillectomy  1980  . Fracture surgery  2012    wrist  . Fracture surgery  2013    bilateral tibia  . Cardiac catheterization  07/2012; 01/2014  . Coronary artery bypass graft N/A 07/06/2014    Procedure: CORONARY ARTERY BYPASS GRAFTING (CABG) x4 using left internal mammary artery and bilateral thigh greater saphenous veins;  Surgeon: Rexene Alberts, MD;  Location: [place];  Service: Open Heart Surgery;  Laterality: N/A;  . Intraoperative transesophageal echocardiogram N/A 07/06/2014    Procedure: INTRAOPERATIVE TRANSESOPHAGEAL ECHOCARDIOGRAM;  Surgeon: Rexene Alberts, MD;  Location: [place];  Service: Open Heart Surgery;  Laterality: N/A;  . Patent foramen ovale closure N/A 07/06/2014    Procedure: PATENT FORAMEN OVALE CLOSURE;  Surgeon: Rexene Alberts, MD;  Location: [place];  Service: Open Heart Surgery;  Laterality: N/A;  . Left heart catheterization with coronary angiogram N/A 07/22/2012    Procedure: LEFT HEART CATHETERIZATION WITH CORONARY ANGIOGRAM;  Surgeon: Peter M Martinique, MD;  Location: [facility] CATH LAB;  Service: Cardiovascular;  Laterality: N/A;  . Left heart catheterization with coronary angiogram N/A 02/13/2014    Procedure: LEFT HEART CATHETERIZATION WITH CORONARY ANGIOGRAM;  Surgeon: Sinclair Grooms, MD;  Location:  [place] CATH LAB;  Service: Cardiovascular;  Laterality: N/A;  . Left heart catheterization with coronary angiogram N/A 07/04/2014    Procedure: LEFT HEART CATHETERIZATION WITH CORONARY ANGIOGRAM;  Surgeon: Sinclair Grooms, MD;  Location: [facility] CATH LAB;  Service: Cardiovascular;  Laterality: N/A;    Family History  Problem Relation Age of Onset  . Heart disease Father     Social History:  reports that he has been smoking Cigarettes.  He has been smoking about 1.00 pack per day. He has never used smokeless tobacco. He reports that he uses illicit drugs (Cocaine and Marijuana). He reports that he does not drink  alcohol.  Allergies: No Known Allergies  Medications: I have reviewed the patient's current medications.  Results for orders placed or performed during the hospital encounter of 02-08-15 (from the past 48 hour(s))  POC CBG, ED     Status: Abnormal   Collection Time: 02-08-2015  3:49 AM  Result Value Ref Range   Glucose-Capillary 146 (H) 65 - 99 mg/dL  Comprehensive metabolic panel     Status: Abnormal   Collection Time: 08-Feb-2015  4:31 AM  Result Value Ref Range   Sodium 142 135 - 145 mmol/L   Potassium 4.5 3.5 - 5.1 mmol/L   Chloride 105 101 - 111 mmol/L   CO2 26 22 - 32 mmol/L   Glucose, Bld 158 (H) 65 - 99 mg/dL   BUN 24 (H) 6 - 20 mg/dL   Creatinine, Ser 0.89 0.61 - 1.24 mg/dL   Calcium 9.1 8.9 - 10.3 mg/dL   Total Protein 7.7 6.5 - 8.1 g/dL   Albumin 4.3 3.5 - 5.0 g/dL   AST 24 15 - 41 U/L   ALT 15 (L) 17 - 63 U/L   Alkaline Phosphatase 90 38 - 126 U/L   Total Bilirubin 0.9 0.3 - 1.2 mg/dL   GFR calc non Af Amer >60 >60 mL/min   GFR calc Af Amer >60 >60 mL/min    Comment: (NOTE) The eGFR has been calculated using the CKD EPI equation. This calculation has not been validated in all clinical situations. eGFR's persistently <60 mL/min signify possible Chronic Kidney Disease.    Anion gap 11 5 - 15  Troponin I     Status: None   Collection Time: 02-08-15  4:31 AM  Result Value Ref Range   Troponin I <0.03 <0.031 ng/mL    Comment:        NO INDICATION OF MYOCARDIAL INJURY.   CBC with Differential/Platelet     Status: Abnormal   Collection Time: 02/08/2015  4:54 AM  Result Value Ref Range   WBC 10.7 (H) 4.0 - 10.5 K/uL   RBC 4.33 4.22 - 5.81 MIL/uL   Hemoglobin 12.0 (L) 13.0 - 17.0 g/dL   HCT 38.4 (L) 39.0 - 52.0 %   MCV 88.7 78.0 - 100.0 fL   MCH 27.7 26.0 - 34.0 pg   MCHC 31.3 30.0 - 36.0 g/dL   RDW 16.1 (H) 11.5 - 15.5 %   Platelets 294 150 - 400 K/uL   Neutrophils Relative % 82 (H) 43 - 77 %   Neutro Abs 8.7 (H) 1.7 - 7.7 K/uL   Lymphocytes Relative 10 (L) 12  - 46 %   Lymphs Abs 1.1 0.7 - 4.0 K/uL   Monocytes Relative 7 3 - 12 %   Monocytes Absolute 0.8 0.1 - 1.0 K/uL   Eosinophils Relative 1 0 - 5 %   Eosinophils Absolute 0.1 0.0 -  0.7 K/uL   Basophils Relative 0 0 - 1 %   Basophils Absolute 0.0 0.0 - 0.1 K/uL  CBG monitoring, ED     Status: Abnormal   Collection Time: 01/24/2015  8:28 AM  Result Value Ref Range   Glucose-Capillary 31 (LL) 65 - 99 mg/dL   Comment 1 Notify RN    Comment 2 Document in Chart    Comment 3 Repeat Test   CBG monitoring, ED     Status: Abnormal   Collection Time: Jan 24, 2015  8:31 AM  Result Value Ref Range   Glucose-Capillary 27 (LL) 65 - 99 mg/dL   Comment 1 Notify RN    Comment 2 Document in Chart    Comment 3 Repeat Test   CBG monitoring, ED     Status: Abnormal   Collection Time: 24-Jan-2015  8:58 AM  Result Value Ref Range   Glucose-Capillary 114 (H) 65 - 99 mg/dL  CBG monitoring, ED     Status: Abnormal   Collection Time: Jan 24, 2015 10:08 AM  Result Value Ref Range   Glucose-Capillary 55 (L) 65 - 99 mg/dL   Comment 1 Notify RN    Comment 2 Document in Chart    Comment 3 Repeat Test   Glucose, capillary     Status: None   Collection Time: 01-24-15 11:18 AM  Result Value Ref Range   Glucose-Capillary 80 65 - 99 mg/dL  Glucose, capillary     Status: Abnormal   Collection Time: 01-24-2015  1:20 PM  Result Value Ref Range   Glucose-Capillary 32 (LL) 65 - 99 mg/dL   Comment 1 Notify RN   Glucose, capillary     Status: Abnormal   Collection Time: 24-Jan-2015  1:51 PM  Result Value Ref Range   Glucose-Capillary 141 (H) 65 - 99 mg/dL  Glucose, capillary     Status: Abnormal   Collection Time: 2015-01-24  4:16 PM  Result Value Ref Range   Glucose-Capillary 143 (H) 65 - 99 mg/dL    Dg Ankle Complete Left  24-Jan-2015   CLINICAL DATA:  Patient woke up in the kitchen floor at home. Unable the stand. Passed out if 4 point sugar dropped and broke legs. Complains of broken left ankle.  EXAM: LEFT ANKLE COMPLETE - 3+  VIEW  COMPARISON:  Left tibia fibula 01/26/2012  FINDINGS: Postoperative changes with intra medullary rod and screw fixation of the healed fracture of the distal left tibia. Acute fractures are demonstrated of the left medial malleolus with lateral displacement of the distal fracture fragment and of the talus with respect to the tibia. Acute fracture of the distal left fibula with lateral displacement and overriding of distal fracture fragment. Posterior malleolar fracture with posterior displacement of distal fracture fragment and posterior displacement of the talus with respect to the tibia. Soft tissue swelling. Vascular calcifications.  IMPRESSION: Trimalleolar fracture dislocation of the left ankle.   Electronically Signed   By: Lucienne Capers M.D.   On: 24-Jan-2015 03:54    ROS Blood pressure 148/80, pulse 128, temperature 98.9 F (37.2 C), temperature source Oral, resp. rate 18, SpO2 91 %. Physical Exam  Assessment/Plan: Left ankle trimal fracture - Pt will require operative treatment of this displaced unstable left ankle injury.  I'll work on getting OR time tomorrow or Tuesday.  In the meantime, he should keep the L LE elevated and be NWB on the L LE.  Kanija Remmel 2015/01/24, 5:58 PM

## 2015-01-30 NOTE — Consult Note (Signed)
CARDIOLOGY CONSULT NOTE   Patient ID: Reginald Gutierrez MRN: [id number], DOB/AGE: [birth year]   Admit date: 2015-01-31 Date of Consult: 01-31-15   Primary Physician: Judie Bonus, MD Primary Cardiologist: Dr. Verdis Prime  Pt. Profile  This is a 49 year old Type I diabetic patient admitted with a trimalleolar fracture of the left ankle.  He has known ischemic heart disease.  Problem List  Past Medical History  Diagnosis Date  . Diabetes mellitus type 1   . Hyperlipidemia   . Depression   . Umbilical hernia   . Hypertension   . Glaucoma   . CHF (congestive heart failure)   . Hypothyroidism   . Hypoglycemia, unspecified   . Disorder of bone and cartilage, unspecified   . Acute posthemorrhagic anemia   . Anxiety state, unspecified   . Chronic pain syndrome   . Acute respiratory failure   . Dysphagia, oral phase   . Stress fracture of tibia or fibula   . Syncope and collapse   . Coronary artery disease 07/19/2012  . Ischemic cardiomyopathy   . Narcotic dependency, continuous   . Heart attack 12/16/11  . NSTEMI (non-ST elevated myocardial infarction) 12/19/2011; 07/03/2014    ; Hattie Perch 07/03/2014  . 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 Appendectomy      open  . Tibia im nail insertion  12/18/2011    Procedure: INTRAMEDULLARY (IM) NAIL TIBIAL;  Surgeon: Eugenia Mcalpine, MD;  Location: [facility];  Service: Orthopedics;  Laterality: Bilateral;  . Insertion of dialysis catheter  12/31/2011    Procedure: INSERTION OF DIALYSIS CATHETER;  Surgeon: Chuck Hint, MD;  [facility]: [facility] OR;  Service: Vascular;  Laterality: N/A;; "while in ICU; not permanent  . Tonsillectomy  1980  . Fracture surgery  2012    wrist  . Fracture surgery  2013    bilateral tibia  . Cardiac catheterization  07/2012; 01/2014  . Coronary artery bypass graft N/A  07/06/2014    Procedure: CORONARY ARTERY BYPASS GRAFTING (CABG) x4 using left internal mammary artery and bilateral thigh greater saphenous veins;  Surgeon: Purcell Nails, MD;  Location: [facility] OR;  Service: Open Heart Surgery;  Laterality: N/A;  . Intraoperative transesophageal echocardiogram N/A 07/06/2014    Procedure: INTRAOPERATIVE TRANSESOPHAGEAL ECHOCARDIOGRAM;  Surgeon: Purcell Nails, MD;  Location: [facility] OR;  Service: Open Heart Surgery;  Laterality: N/A;  . Patent foramen ovale closure N/A 07/06/2014    Procedure: PATENT FORAMEN OVALE CLOSURE;  Surgeon: Purcell Nails, MD;  Location: [facility] OR;  Service: Open Heart Surgery;  Laterality: N/A;  . Left heart catheterization with coronary angiogram N/A 07/22/2012    Procedure: LEFT HEART CATHETERIZATION WITH CORONARY ANGIOGRAM;  Surgeon: Peter M Swaziland, MD;  Location: [facility] CATH LAB;  Service: Cardiovascular;  Laterality: N/A;  . Left heart catheterization with coronary angiogram N/A 02/13/2014    Procedure: LEFT HEART CATHETERIZATION WITH CORONARY ANGIOGRAM;  Surgeon: [name], MD;  Location: [facility] CATH LAB;  Service: Cardiovascular;  Laterality: N/A;  . Left heart catheterization with coronary angiogram N/A 07/04/2014    Procedure: LEFT HEART CATHETERIZATION WITH CORONARY ANGIOGRAM;  Surgeon: [name], MD;  Location: [facility] CATH LAB;  Service: Cardiovascular;  Laterality: N/A;     Allergies  No Known Allergies  HPI  This 49 year old gentleman is known to me from prior admissions.  He has a history of known ischemic heart disease.  He has a prior history of CABG 4 on 07/06/14 by Dr. Charlann Boxer.  He also had intraoperative closure of a patent foramen ovale.  He has a prior history of chronic systolic heart failure.  Post surgery, his left ventricular ejection fraction has improved to 40-45%.  The patient has not been experiencing any recurrent chest pain or angina.  He has been very pleased with his results from surgery. The patient is a  insulin-dependent diabetic.  He has had occasional episodes of hypoglycemia.  He has just switched to a new diabetes doctor.  He now sees Dr. Juleen China.  Last evening he suspects that he had a hypoglycemic episode leading to an episode of syncope and a fall.  He awoke in the middle the night to find himself on the floor in the kitchen next to the refrigerator.  He realized that his left ankle was injured but was able to crawl to the adjacent table where he was able to check his blood sugar and it was 41.  Inpatient Medications  . nicotine  7 mg Transdermal Daily    Family History Family History  Problem Relation Age of Onset  . Heart disease Father      Social History History   Social History  . Marital Status: Single    Spouse Name: N/A  . Number of Children: 0  . Years of Education: 16   Occupational History  . Sales- disabled    Social History Main Topics  . Smoking status: Current Some Day Smoker -- 1.00 packs/day    Types: Cigarettes  . Smokeless tobacco: Never Used     Comment: pt states he has not smoked in 4 days  . Alcohol Use: No  . Drug Use: No     Comment: Past Cocaine abuse-2002-2003  . Sexual Activity: No   Other Topics Concern  . Not on file   Social History Narrative     Review of Systems  General:  No chills, fever, night sweats or weight changes.  Cardiovascular:  No chest pain, dyspnea on exertion, edema, orthopnea, palpitations, paroxysmal nocturnal dyspnea. Dermatological: No rash, lesions/masses Respiratory: No cough, dyspnea Urologic: No hematuria, dysuria Abdominal:   No nausea, vomiting, diarrhea, bright red blood per rectum, melena, or hematemesis Neurologic:  No visual changes, wkns, changes in mental status. All other systems reviewed and are otherwise negative except as noted above.  Physical Exam  Blood pressure 136/84, pulse 95, temperature 98.3 F (36.8 C), temperature source Oral, resp. rate 16, SpO2 95 %.  General: Pleasant,  NAD Psych: Normal affect. Neuro: Alert and oriented X 3. Moves all extremities spontaneously. HEENT: Normal  Neck: Supple without bruits or JVD. Lungs:  Resp regular and unlabored, CTA. Heart: RRR no s3, s4, or murmurs.  Sternal incision is well-healed. Abdomen: Soft, non-tender, non-distended, BS + x 4.  Extremities: No clubbing, cyanosis or edema.  Pedal pulses are difficult to feel.  The left ankle is tender and swollen and ice bag is in place  Labs   Recent Labs  2015/01/26 0431  TROPONINI <0.03   Lab Results  Component Value Date   WBC 10.7* 01/26/15   HGB 12.0* 2015/01/26   HCT 38.4* Jan 26, 2015   MCV 88.7 Jan 26, 2015   PLT 294 01-26-15     Recent Labs Lab Jan 26, 2015 0431  NA 142  K 4.5  CL 105  CO2  26  BUN 24*  CREATININE 0.89  CALCIUM 9.1  PROT 7.7  BILITOT 0.9  ALKPHOS 90  ALT 15*  AST 24  GLUCOSE 158*   Lab Results  Component Value Date   CHOL 147 07/05/2014   HDL 52 07/05/2014   LDLCALC 69 07/05/2014   TRIG 128 07/05/2014   No results found for: DDIMER  Radiology/Studies  Dg Ankle Complete Left  2015-02-06   CLINICAL DATA:  Patient woke up in the kitchen floor at home. Unable the stand. Passed out if 4 point sugar dropped and broke legs. Complains of broken left ankle.  EXAM: LEFT ANKLE COMPLETE - 3+ VIEW  COMPARISON:  Left tibia fibula 01/26/2012  FINDINGS: Postoperative changes with intra medullary rod and screw fixation of the healed fracture of the distal left tibia. Acute fractures are demonstrated of the left medial malleolus with lateral displacement of the distal fracture fragment and of the talus with respect to the tibia. Acute fracture of the distal left fibula with lateral displacement and overriding of distal fracture fragment. Posterior malleolar fracture with posterior displacement of distal fracture fragment and posterior displacement of the talus with respect to the tibia. Soft tissue swelling. Vascular calcifications.  IMPRESSION:  Trimalleolar fracture dislocation of the left ankle.   Electronically Signed   By: Burman Nieves M.D.   On: 2015-02-06 03:54    ECG  2015/02/06 03:51:13 [facility] ROUTINE RECORD Sinus rhythm Multiple ventricular premature complexes Left atrial enlargement Probable LVH with secondary repol abnrm Anterior Q waves, possibly due to LVH PVCs new from prior Confirmed by OTTER MD, OLGA ([id number]) on 02-06-15 4:05:11 AM Personally reviewed by me. ASSESSMENT AND PLAN  1.  Trimalleolar fracture of left ankle occurring during probable syncopal episode secondary to hypoglycemia. 2.  Type I diabetes mellitus, insulin-dependent 3.  Ischemic heart disease status post CABG 4 and status post closure of patent foramen ovale in November 2015.  No subsequent angina pectoris and most recent ejection fraction is 40-45% by echocardiogram on 09/27/14  Recommendation: The patient is cleared for surgery today.  Would continue current home doses of aspirin, statin, beta blocker, and ACE inhibitor postop.  Karie Schwalbe MD  02/06/15, 8:04 AM

## 2015-01-30 NOTE — Progress Notes (Signed)
Hypoglycemic Event  CBG: 32  Treatment: D50 IV 50 mL   Symptoms: Nervous/irritableIrritability, verbally abusive  Follow-up CBG: Time:1349 CBG Result: 141  Possible Reasons for Event:   NPO and had taken long acting insulin last night  Comments/MD notified:Elgergawy    Reginald Gutierrez  Remember to initiate Hypoglycemia Order Set & 2complete

## 2015-01-30 NOTE — ED Notes (Signed)
Report called to [facility] RN 5N

## 2015-01-30 DEATH — deceased

## 2015-02-01 ENCOUNTER — Encounter (HOSPITAL_COMMUNITY): Payer: Medicare Other

## 2015-02-01 ENCOUNTER — Ambulatory Visit (HOSPITAL_COMMUNITY): Payer: Self-pay

## 2015-02-04 ENCOUNTER — Encounter (HOSPITAL_COMMUNITY): Payer: Medicare Other

## 2015-02-04 ENCOUNTER — Ambulatory Visit: Payer: Self-pay | Admitting: Dietician

## 2015-02-06 ENCOUNTER — Ambulatory Visit (HOSPITAL_COMMUNITY): Payer: Self-pay

## 2015-02-06 ENCOUNTER — Encounter (HOSPITAL_COMMUNITY): Payer: Medicare Other

## 2015-02-08 ENCOUNTER — Ambulatory Visit (HOSPITAL_COMMUNITY): Payer: Self-pay

## 2015-02-08 ENCOUNTER — Encounter (HOSPITAL_COMMUNITY): Payer: Medicare Other

## 2015-02-11 ENCOUNTER — Encounter (HOSPITAL_COMMUNITY): Payer: Medicare Other

## 2015-02-13 ENCOUNTER — Encounter (HOSPITAL_COMMUNITY): Payer: Medicare Other

## 2015-02-13 ENCOUNTER — Ambulatory Visit (HOSPITAL_COMMUNITY): Payer: Self-pay

## 2015-02-15 ENCOUNTER — Ambulatory Visit (HOSPITAL_COMMUNITY): Payer: Self-pay

## 2015-02-15 ENCOUNTER — Encounter (HOSPITAL_COMMUNITY): Payer: Medicare Other

## 2015-02-18 ENCOUNTER — Encounter (HOSPITAL_COMMUNITY): Payer: Medicare Other

## 2015-02-20 ENCOUNTER — Encounter (HOSPITAL_COMMUNITY): Payer: Medicare Other

## 2015-02-22 ENCOUNTER — Encounter (HOSPITAL_COMMUNITY): Payer: Medicare Other

## 2015-02-25 ENCOUNTER — Encounter (HOSPITAL_COMMUNITY): Payer: Medicare Other

## 2015-07-08 ENCOUNTER — Ambulatory Visit: Payer: Self-pay | Admitting: Thoracic Surgery (Cardiothoracic Vascular Surgery)
